# Patient Record
Sex: Female | Born: 1935 | Race: White | Hispanic: No | Smoking: Never smoker
Health system: Southern US, Community
[De-identification: ages and names within clinical notes are randomized; demographics above are authoritative.]

## PROBLEM LIST (undated history)

## (undated) DIAGNOSIS — K810 Acute cholecystitis: Secondary | ICD-10-CM

## (undated) DIAGNOSIS — F419 Anxiety disorder, unspecified: Secondary | ICD-10-CM

## (undated) DIAGNOSIS — H9190 Unspecified hearing loss, unspecified ear: Secondary | ICD-10-CM

## (undated) DIAGNOSIS — F329 Major depressive disorder, single episode, unspecified: Secondary | ICD-10-CM

## (undated) DIAGNOSIS — H409 Unspecified glaucoma: Secondary | ICD-10-CM

## (undated) DIAGNOSIS — R1310 Dysphagia, unspecified: Secondary | ICD-10-CM

## (undated) DIAGNOSIS — Z8719 Personal history of other diseases of the digestive system: Secondary | ICD-10-CM

## (undated) DIAGNOSIS — Z9289 Personal history of other medical treatment: Secondary | ICD-10-CM

## (undated) DIAGNOSIS — K219 Gastro-esophageal reflux disease without esophagitis: Secondary | ICD-10-CM

## (undated) DIAGNOSIS — I10 Essential (primary) hypertension: Secondary | ICD-10-CM

## (undated) DIAGNOSIS — G5600 Carpal tunnel syndrome, unspecified upper limb: Secondary | ICD-10-CM

## (undated) DIAGNOSIS — R531 Weakness: Secondary | ICD-10-CM

## (undated) DIAGNOSIS — E785 Hyperlipidemia, unspecified: Secondary | ICD-10-CM

## (undated) DIAGNOSIS — F32A Depression, unspecified: Secondary | ICD-10-CM

## (undated) DIAGNOSIS — D649 Anemia, unspecified: Secondary | ICD-10-CM

## (undated) DIAGNOSIS — N289 Disorder of kidney and ureter, unspecified: Secondary | ICD-10-CM

## (undated) DIAGNOSIS — K8020 Calculus of gallbladder without cholecystitis without obstruction: Secondary | ICD-10-CM

## (undated) DIAGNOSIS — F39 Unspecified mood [affective] disorder: Secondary | ICD-10-CM

## (undated) DIAGNOSIS — E860 Dehydration: Secondary | ICD-10-CM

## (undated) DIAGNOSIS — L209 Atopic dermatitis, unspecified: Secondary | ICD-10-CM

## (undated) DIAGNOSIS — E039 Hypothyroidism, unspecified: Secondary | ICD-10-CM

## (undated) DIAGNOSIS — E119 Type 2 diabetes mellitus without complications: Secondary | ICD-10-CM

## (undated) DIAGNOSIS — IMO0001 Reserved for inherently not codable concepts without codable children: Secondary | ICD-10-CM

## (undated) HISTORY — DX: Atopic dermatitis, unspecified: L20.9

## (undated) HISTORY — DX: Calculus of gallbladder without cholecystitis without obstruction: K80.20

## (undated) HISTORY — PX: ABDOMINAL HYSTERECTOMY: SHX81

## (undated) HISTORY — DX: Hypothyroidism, unspecified: E03.9

## (undated) HISTORY — DX: Anxiety disorder, unspecified: F41.9

## (undated) HISTORY — PX: OTHER SURGICAL HISTORY: SHX169

## (undated) HISTORY — DX: Hyperlipidemia, unspecified: E78.5

## (undated) HISTORY — DX: Acute cholecystitis: K81.0

## (undated) HISTORY — PX: PARTIAL HYSTERECTOMY: SHX80

## (undated) HISTORY — DX: Personal history of other diseases of the digestive system: Z87.19

## (undated) HISTORY — DX: Personal history of other medical treatment: Z92.89

## (undated) HISTORY — PX: CHOLECYSTECTOMY: SHX55

## (undated) HISTORY — DX: Dehydration: E86.0

## (undated) HISTORY — DX: Type 2 diabetes mellitus without complications: E11.9

## (undated) HISTORY — PX: TRIGGER FINGER RELEASE: SHX641

---

## 1995-11-01 DIAGNOSIS — F411 Generalized anxiety disorder: Secondary | ICD-10-CM | POA: Insufficient documentation

## 1995-11-01 DIAGNOSIS — F419 Anxiety disorder, unspecified: Secondary | ICD-10-CM

## 1995-11-01 DIAGNOSIS — E039 Hypothyroidism, unspecified: Secondary | ICD-10-CM

## 1995-11-01 HISTORY — DX: Anxiety disorder, unspecified: F41.9

## 1995-11-01 HISTORY — DX: Hypothyroidism, unspecified: E03.9

## 1996-05-25 DIAGNOSIS — E119 Type 2 diabetes mellitus without complications: Secondary | ICD-10-CM

## 1996-05-25 HISTORY — DX: Type 2 diabetes mellitus without complications: E11.9

## 1997-03-25 ENCOUNTER — Encounter: Payer: Self-pay | Admitting: Family Medicine

## 1997-03-25 LAB — CONVERTED CEMR LAB: Pap Smear: NORMAL

## 2000-06-25 ENCOUNTER — Encounter: Payer: Self-pay | Admitting: Family Medicine

## 2000-07-16 ENCOUNTER — Other Ambulatory Visit: Admission: RE | Admit: 2000-07-16 | Discharge: 2000-07-16 | Payer: Self-pay | Admitting: Family Medicine

## 2000-09-22 ENCOUNTER — Encounter: Payer: Self-pay | Admitting: Family Medicine

## 2000-09-22 LAB — CONVERTED CEMR LAB: Hgb A1c MFr Bld: 6.9 %

## 2001-04-08 ENCOUNTER — Encounter: Payer: Self-pay | Admitting: Family Medicine

## 2001-06-25 ENCOUNTER — Encounter: Payer: Self-pay | Admitting: Family Medicine

## 2001-06-25 LAB — CONVERTED CEMR LAB: Hgb A1c MFr Bld: 9.5 %

## 2001-08-23 ENCOUNTER — Encounter: Payer: Self-pay | Admitting: Family Medicine

## 2001-08-23 LAB — CONVERTED CEMR LAB: Hgb A1c MFr Bld: 9.4 %

## 2002-06-25 ENCOUNTER — Encounter: Payer: Self-pay | Admitting: Family Medicine

## 2002-06-25 DIAGNOSIS — E785 Hyperlipidemia, unspecified: Secondary | ICD-10-CM

## 2002-06-25 HISTORY — DX: Hyperlipidemia, unspecified: E78.5

## 2002-06-25 LAB — CONVERTED CEMR LAB
Hgb A1c MFr Bld: 11.2 %
Microalbumin U total vol: 6.4 mg/L

## 2002-07-19 ENCOUNTER — Other Ambulatory Visit: Admission: RE | Admit: 2002-07-19 | Discharge: 2002-07-19 | Payer: Self-pay | Admitting: Family Medicine

## 2002-07-24 LAB — FECAL OCCULT BLOOD, GUAIAC: Fecal Occult Blood: NEGATIVE

## 2002-08-24 ENCOUNTER — Encounter: Payer: Self-pay | Admitting: Family Medicine

## 2002-10-24 ENCOUNTER — Encounter: Payer: Self-pay | Admitting: Family Medicine

## 2003-11-23 ENCOUNTER — Encounter: Payer: Self-pay | Admitting: Family Medicine

## 2003-11-23 LAB — CONVERTED CEMR LAB: Hgb A1c MFr Bld: 9.8 %

## 2004-02-23 ENCOUNTER — Encounter: Payer: Self-pay | Admitting: Family Medicine

## 2004-03-25 ENCOUNTER — Ambulatory Visit: Payer: Self-pay | Admitting: Family Medicine

## 2004-04-21 ENCOUNTER — Ambulatory Visit: Payer: Self-pay | Admitting: Family Medicine

## 2004-05-22 ENCOUNTER — Ambulatory Visit: Payer: Self-pay | Admitting: Family Medicine

## 2004-06-25 ENCOUNTER — Ambulatory Visit: Payer: Self-pay | Admitting: Family Medicine

## 2004-07-25 ENCOUNTER — Ambulatory Visit: Payer: Self-pay | Admitting: Family Medicine

## 2004-08-27 ENCOUNTER — Ambulatory Visit: Payer: Self-pay | Admitting: Family Medicine

## 2004-09-26 ENCOUNTER — Ambulatory Visit: Payer: Self-pay | Admitting: Family Medicine

## 2004-10-09 ENCOUNTER — Ambulatory Visit: Payer: Self-pay | Admitting: Family Medicine

## 2004-10-23 ENCOUNTER — Encounter: Payer: Self-pay | Admitting: Family Medicine

## 2004-10-23 LAB — CONVERTED CEMR LAB
Hgb A1c MFr Bld: 10.2 %
Microalbumin U total vol: 5.3 mg/L

## 2004-10-27 ENCOUNTER — Ambulatory Visit: Payer: Self-pay | Admitting: Family Medicine

## 2004-11-20 ENCOUNTER — Ambulatory Visit: Payer: Self-pay | Admitting: Family Medicine

## 2004-11-26 ENCOUNTER — Ambulatory Visit: Payer: Self-pay | Admitting: Family Medicine

## 2004-12-23 ENCOUNTER — Ambulatory Visit: Payer: Self-pay | Admitting: Family Medicine

## 2005-01-12 ENCOUNTER — Ambulatory Visit: Payer: Self-pay | Admitting: Family Medicine

## 2005-02-16 ENCOUNTER — Ambulatory Visit: Payer: Self-pay | Admitting: Family Medicine

## 2005-02-22 ENCOUNTER — Encounter: Payer: Self-pay | Admitting: Family Medicine

## 2005-02-22 ENCOUNTER — Ambulatory Visit: Payer: Self-pay | Admitting: Family Medicine

## 2005-02-22 LAB — CONVERTED CEMR LAB
Hgb A1c MFr Bld: 7.7 %
Microalbumin U total vol: 5.3 mg/L

## 2005-02-24 ENCOUNTER — Ambulatory Visit: Payer: Self-pay | Admitting: Family Medicine

## 2005-02-26 ENCOUNTER — Ambulatory Visit: Payer: Self-pay | Admitting: Family Medicine

## 2005-04-02 ENCOUNTER — Ambulatory Visit: Payer: Self-pay | Admitting: Family Medicine

## 2005-05-13 ENCOUNTER — Ambulatory Visit: Payer: Self-pay | Admitting: Family Medicine

## 2005-07-06 ENCOUNTER — Ambulatory Visit: Payer: Self-pay | Admitting: Family Medicine

## 2005-07-10 ENCOUNTER — Ambulatory Visit: Payer: Self-pay | Admitting: Family Medicine

## 2005-07-10 DIAGNOSIS — Z9289 Personal history of other medical treatment: Secondary | ICD-10-CM

## 2005-07-10 HISTORY — DX: Personal history of other medical treatment: Z92.89

## 2005-08-11 ENCOUNTER — Ambulatory Visit: Payer: Self-pay | Admitting: Family Medicine

## 2005-09-08 ENCOUNTER — Ambulatory Visit: Payer: Self-pay | Admitting: Family Medicine

## 2005-10-09 ENCOUNTER — Ambulatory Visit: Payer: Self-pay | Admitting: Family Medicine

## 2005-11-16 ENCOUNTER — Ambulatory Visit: Payer: Self-pay | Admitting: Family Medicine

## 2005-12-22 ENCOUNTER — Ambulatory Visit: Payer: Self-pay | Admitting: Family Medicine

## 2006-01-26 ENCOUNTER — Ambulatory Visit: Payer: Self-pay | Admitting: Family Medicine

## 2006-02-12 ENCOUNTER — Ambulatory Visit: Payer: Self-pay | Admitting: Family Medicine

## 2006-02-22 ENCOUNTER — Ambulatory Visit: Payer: Self-pay | Admitting: Family Medicine

## 2006-03-09 ENCOUNTER — Ambulatory Visit: Payer: Self-pay | Admitting: Family Medicine

## 2006-03-25 ENCOUNTER — Ambulatory Visit: Payer: Self-pay | Admitting: Family Medicine

## 2006-04-12 ENCOUNTER — Ambulatory Visit: Payer: Self-pay | Admitting: Family Medicine

## 2006-05-11 ENCOUNTER — Ambulatory Visit: Payer: Self-pay | Admitting: Family Medicine

## 2006-06-16 ENCOUNTER — Ambulatory Visit: Payer: Self-pay | Admitting: Family Medicine

## 2006-07-27 ENCOUNTER — Ambulatory Visit: Payer: Self-pay | Admitting: Family Medicine

## 2006-08-24 ENCOUNTER — Ambulatory Visit: Payer: Self-pay | Admitting: Family Medicine

## 2006-09-08 ENCOUNTER — Encounter: Payer: Self-pay | Admitting: Family Medicine

## 2006-09-08 DIAGNOSIS — N959 Unspecified menopausal and perimenopausal disorder: Secondary | ICD-10-CM | POA: Insufficient documentation

## 2006-09-28 ENCOUNTER — Ambulatory Visit: Payer: Self-pay | Admitting: Family Medicine

## 2006-10-28 ENCOUNTER — Ambulatory Visit: Payer: Self-pay | Admitting: Family Medicine

## 2006-10-28 DIAGNOSIS — L259 Unspecified contact dermatitis, unspecified cause: Secondary | ICD-10-CM

## 2006-10-28 LAB — CONVERTED CEMR LAB: Hgb A1c MFr Bld: 14.2 % — ABNORMAL HIGH (ref 4.6–6.0)

## 2006-11-08 ENCOUNTER — Encounter: Payer: Self-pay | Admitting: Family Medicine

## 2006-11-22 ENCOUNTER — Encounter: Payer: Self-pay | Admitting: Family Medicine

## 2006-11-24 ENCOUNTER — Encounter: Payer: Self-pay | Admitting: Family Medicine

## 2006-11-24 ENCOUNTER — Encounter (INDEPENDENT_AMBULATORY_CARE_PROVIDER_SITE_OTHER): Payer: Self-pay | Admitting: *Deleted

## 2006-11-29 ENCOUNTER — Ambulatory Visit: Payer: Self-pay | Admitting: Family Medicine

## 2006-12-01 ENCOUNTER — Ambulatory Visit: Payer: Self-pay | Admitting: Family Medicine

## 2006-12-01 DIAGNOSIS — D72819 Decreased white blood cell count, unspecified: Secondary | ICD-10-CM | POA: Insufficient documentation

## 2006-12-01 LAB — CONVERTED CEMR LAB
ALT: 24 units/L (ref 0–35)
AST: 51 units/L — ABNORMAL HIGH (ref 0–37)
Albumin: 2.8 g/dL — ABNORMAL LOW (ref 3.5–5.2)
Basophils Absolute: 0 10*3/uL (ref 0.0–0.1)
Calcium: 9.3 mg/dL (ref 8.4–10.5)
Chloride: 110 meq/L (ref 96–112)
Creatinine, Ser: 0.7 mg/dL (ref 0.4–1.2)
Creatinine,U: 32.7 mg/dL
Eosinophils Absolute: 0.4 10*3/uL (ref 0.0–0.6)
Eosinophils Relative: 13 % — ABNORMAL HIGH (ref 0.0–5.0)
Free T4: 0.7 ng/dL (ref 0.6–1.6)
GFR calc non Af Amer: 88 mL/min
Glucose, Bld: 367 mg/dL — ABNORMAL HIGH (ref 70–99)
HCT: 32.3 % — ABNORMAL LOW (ref 36.0–46.0)
Hgb A1c MFr Bld: 11.1 % — ABNORMAL HIGH (ref 4.6–6.0)
Neutrophils Relative %: 44.8 % (ref 43.0–77.0)
Platelets: 131 10*3/uL — ABNORMAL LOW (ref 150–400)
RBC: 3.57 M/uL — ABNORMAL LOW (ref 3.87–5.11)
RDW: 13.9 % (ref 11.5–14.6)
Sodium: 140 meq/L (ref 135–145)
Total Bilirubin: 0.9 mg/dL (ref 0.3–1.2)
WBC: 3.3 10*3/uL — ABNORMAL LOW (ref 4.5–10.5)

## 2006-12-02 ENCOUNTER — Ambulatory Visit: Payer: Self-pay | Admitting: Oncology

## 2006-12-15 ENCOUNTER — Encounter: Payer: Self-pay | Admitting: Family Medicine

## 2006-12-15 LAB — CBC WITH DIFFERENTIAL (CANCER CENTER ONLY)
EOS%: 11.1 % — ABNORMAL HIGH (ref 0.0–7.0)
Eosinophils Absolute: 0.4 10*3/uL (ref 0.0–0.5)
LYMPH%: 38.8 % (ref 14.0–48.0)
MCH: 30.3 pg (ref 26.0–34.0)
MCHC: 34 g/dL (ref 32.0–36.0)
MCV: 89 fL (ref 81–101)
MONO%: 6.2 % (ref 0.0–13.0)
Platelets: 145 10*3/uL (ref 145–400)
RBC: 4.04 10*6/uL (ref 3.70–5.32)

## 2006-12-15 LAB — FERRITIN: Ferritin: 47 ng/mL (ref 10–291)

## 2006-12-15 LAB — COMPREHENSIVE METABOLIC PANEL
ALT: 33 U/L (ref 0–35)
AST: 61 U/L — ABNORMAL HIGH (ref 0–37)
Albumin: 3.4 g/dL — ABNORMAL LOW (ref 3.5–5.2)
Alkaline Phosphatase: 277 U/L — ABNORMAL HIGH (ref 39–117)
BUN: 15 mg/dL (ref 6–23)
CO2: 25 mEq/L (ref 19–32)
Calcium: 9.2 mg/dL (ref 8.4–10.5)
Chloride: 105 mEq/L (ref 96–112)
Creatinine, Ser: 0.74 mg/dL (ref 0.40–1.20)
Glucose, Bld: 181 mg/dL — ABNORMAL HIGH (ref 70–99)
Potassium: 4 mEq/L (ref 3.5–5.3)
Sodium: 138 mEq/L (ref 135–145)
Total Bilirubin: 1.1 mg/dL (ref 0.3–1.2)
Total Protein: 7.6 g/dL (ref 6.0–8.3)

## 2006-12-15 LAB — RETICULOCYTES (CHCC)
ABS Retic: 45.2 10*3/uL (ref 19.0–186.0)
RBC.: 4.11 MIL/uL (ref 3.87–5.11)
Retic Ct Pct: 1.1 % (ref 0.4–3.1)

## 2006-12-15 LAB — LACTATE DEHYDROGENASE: LDH: 171 U/L (ref 94–250)

## 2006-12-22 ENCOUNTER — Encounter: Payer: Self-pay | Admitting: Family Medicine

## 2007-01-03 ENCOUNTER — Telehealth (INDEPENDENT_AMBULATORY_CARE_PROVIDER_SITE_OTHER): Payer: Self-pay | Admitting: *Deleted

## 2007-01-21 ENCOUNTER — Encounter: Payer: Self-pay | Admitting: Family Medicine

## 2007-01-25 ENCOUNTER — Ambulatory Visit: Payer: Self-pay | Admitting: Family Medicine

## 2007-02-18 ENCOUNTER — Ambulatory Visit: Payer: Self-pay | Admitting: Dermatology

## 2007-02-25 ENCOUNTER — Ambulatory Visit: Payer: Self-pay | Admitting: Family Medicine

## 2007-03-16 ENCOUNTER — Encounter: Payer: Self-pay | Admitting: Family Medicine

## 2007-03-16 ENCOUNTER — Ambulatory Visit: Payer: Self-pay | Admitting: Oncology

## 2007-03-17 ENCOUNTER — Encounter: Payer: Self-pay | Admitting: Family Medicine

## 2007-03-17 LAB — CBC WITH DIFFERENTIAL (CANCER CENTER ONLY)
BASO%: 0.3 % (ref 0.0–2.0)
LYMPH%: 29.9 % (ref 14.0–48.0)
MCH: 30.7 pg (ref 26.0–34.0)
MCV: 90 fL (ref 81–101)
MONO#: 0.3 10*3/uL (ref 0.1–0.9)
MONO%: 7.5 % (ref 0.0–13.0)
NEUT#: 2.6 10*3/uL (ref 1.5–6.5)
Platelets: 119 10*3/uL — ABNORMAL LOW (ref 145–400)
RDW: 13.8 % (ref 10.5–14.6)
WBC: 4.5 10*3/uL (ref 3.9–10.0)

## 2007-03-22 ENCOUNTER — Ambulatory Visit: Payer: Self-pay | Admitting: Family Medicine

## 2007-04-06 ENCOUNTER — Ambulatory Visit: Payer: Self-pay | Admitting: Family Medicine

## 2007-05-12 ENCOUNTER — Ambulatory Visit: Payer: Self-pay | Admitting: Family Medicine

## 2007-06-17 ENCOUNTER — Ambulatory Visit: Payer: Self-pay | Admitting: Family Medicine

## 2007-06-24 ENCOUNTER — Encounter: Payer: Self-pay | Admitting: Family Medicine

## 2007-07-11 ENCOUNTER — Telehealth: Payer: Self-pay | Admitting: Family Medicine

## 2007-07-19 ENCOUNTER — Ambulatory Visit: Payer: Self-pay | Admitting: Family Medicine

## 2007-07-22 ENCOUNTER — Telehealth: Payer: Self-pay | Admitting: Family Medicine

## 2007-08-22 ENCOUNTER — Ambulatory Visit: Payer: Self-pay | Admitting: Family Medicine

## 2007-09-14 ENCOUNTER — Ambulatory Visit: Payer: Self-pay | Admitting: Oncology

## 2007-09-17 ENCOUNTER — Encounter: Payer: Self-pay | Admitting: Family Medicine

## 2007-09-27 ENCOUNTER — Ambulatory Visit: Payer: Self-pay | Admitting: Family Medicine

## 2007-09-29 ENCOUNTER — Encounter: Payer: Self-pay | Admitting: Family Medicine

## 2007-09-30 ENCOUNTER — Telehealth: Payer: Self-pay | Admitting: Family Medicine

## 2007-10-24 ENCOUNTER — Ambulatory Visit: Payer: Self-pay | Admitting: Oncology

## 2007-10-24 ENCOUNTER — Encounter: Payer: Self-pay | Admitting: Family Medicine

## 2007-10-27 ENCOUNTER — Ambulatory Visit: Payer: Self-pay | Admitting: Family Medicine

## 2007-11-16 ENCOUNTER — Inpatient Hospital Stay: Payer: Self-pay | Admitting: Endocrinology

## 2007-11-18 ENCOUNTER — Encounter: Payer: Self-pay | Admitting: Family Medicine

## 2007-11-21 ENCOUNTER — Encounter: Payer: Self-pay | Admitting: Family Medicine

## 2007-11-21 DIAGNOSIS — Z8719 Personal history of other diseases of the digestive system: Secondary | ICD-10-CM

## 2007-11-21 HISTORY — DX: Personal history of other diseases of the digestive system: Z87.19

## 2007-11-23 ENCOUNTER — Ambulatory Visit: Payer: Self-pay | Admitting: Oncology

## 2007-11-23 ENCOUNTER — Encounter: Payer: Self-pay | Admitting: Family Medicine

## 2007-11-28 DIAGNOSIS — Z8619 Personal history of other infectious and parasitic diseases: Secondary | ICD-10-CM

## 2007-11-28 DIAGNOSIS — M359 Systemic involvement of connective tissue, unspecified: Secondary | ICD-10-CM | POA: Insufficient documentation

## 2007-11-28 DIAGNOSIS — D61818 Other pancytopenia: Secondary | ICD-10-CM | POA: Insufficient documentation

## 2007-12-15 ENCOUNTER — Other Ambulatory Visit: Payer: Self-pay

## 2007-12-15 ENCOUNTER — Ambulatory Visit: Payer: Self-pay | Admitting: Family Medicine

## 2007-12-15 ENCOUNTER — Emergency Department: Payer: Self-pay | Admitting: Emergency Medicine

## 2007-12-24 ENCOUNTER — Ambulatory Visit: Payer: Self-pay | Admitting: Oncology

## 2007-12-30 ENCOUNTER — Encounter: Payer: Self-pay | Admitting: Family Medicine

## 2008-01-19 ENCOUNTER — Telehealth (INDEPENDENT_AMBULATORY_CARE_PROVIDER_SITE_OTHER): Payer: Self-pay | Admitting: *Deleted

## 2008-01-31 ENCOUNTER — Telehealth: Payer: Self-pay | Admitting: Family Medicine

## 2008-03-08 ENCOUNTER — Encounter (INDEPENDENT_AMBULATORY_CARE_PROVIDER_SITE_OTHER): Payer: Self-pay | Admitting: *Deleted

## 2008-03-08 ENCOUNTER — Telehealth: Payer: Self-pay | Admitting: Family Medicine

## 2008-03-14 ENCOUNTER — Ambulatory Visit: Payer: Self-pay | Admitting: Family Medicine

## 2008-04-17 ENCOUNTER — Telehealth: Payer: Self-pay | Admitting: Family Medicine

## 2008-04-20 ENCOUNTER — Telehealth (INDEPENDENT_AMBULATORY_CARE_PROVIDER_SITE_OTHER): Payer: Self-pay | Admitting: *Deleted

## 2008-05-08 ENCOUNTER — Ambulatory Visit: Payer: Self-pay | Admitting: Family Medicine

## 2008-05-08 DIAGNOSIS — R1011 Right upper quadrant pain: Secondary | ICD-10-CM | POA: Insufficient documentation

## 2008-05-09 ENCOUNTER — Telehealth: Payer: Self-pay | Admitting: Family Medicine

## 2008-05-09 ENCOUNTER — Encounter: Payer: Self-pay | Admitting: Family Medicine

## 2008-05-11 ENCOUNTER — Inpatient Hospital Stay: Payer: Self-pay | Admitting: *Deleted

## 2008-05-11 DIAGNOSIS — K8020 Calculus of gallbladder without cholecystitis without obstruction: Secondary | ICD-10-CM

## 2008-05-11 HISTORY — DX: Calculus of gallbladder without cholecystitis without obstruction: K80.20

## 2008-05-20 ENCOUNTER — Encounter: Payer: Self-pay | Admitting: Family Medicine

## 2008-05-22 ENCOUNTER — Telehealth: Payer: Self-pay | Admitting: Family Medicine

## 2008-05-23 ENCOUNTER — Telehealth: Payer: Self-pay | Admitting: Family Medicine

## 2008-05-31 ENCOUNTER — Encounter: Payer: Self-pay | Admitting: Family Medicine

## 2008-06-01 ENCOUNTER — Ambulatory Visit: Payer: Self-pay | Admitting: Family Medicine

## 2008-06-01 DIAGNOSIS — M79609 Pain in unspecified limb: Secondary | ICD-10-CM | POA: Insufficient documentation

## 2008-06-05 ENCOUNTER — Ambulatory Visit: Payer: Self-pay | Admitting: Family Medicine

## 2008-06-08 ENCOUNTER — Encounter: Payer: Self-pay | Admitting: Family Medicine

## 2008-06-11 ENCOUNTER — Encounter (INDEPENDENT_AMBULATORY_CARE_PROVIDER_SITE_OTHER): Payer: Self-pay | Admitting: *Deleted

## 2008-06-19 ENCOUNTER — Ambulatory Visit: Payer: Self-pay | Admitting: Family Medicine

## 2008-06-21 LAB — CONVERTED CEMR LAB
BUN: 7 mg/dL (ref 6–23)
Basophils Relative: 0.8 % (ref 0.0–3.0)
CO2: 30 meq/L (ref 19–32)
Calcium: 9.5 mg/dL (ref 8.4–10.5)
Chloride: 103 meq/L (ref 96–112)
Creatinine, Ser: 0.7 mg/dL (ref 0.4–1.2)
Eosinophils Relative: 0.5 % (ref 0.0–5.0)
Glucose, Bld: 268 mg/dL — ABNORMAL HIGH (ref 70–99)
Hemoglobin: 12.6 g/dL (ref 12.0–15.0)
Lymphocytes Relative: 34.6 % (ref 12.0–46.0)
Magnesium: 1.6 mg/dL (ref 1.5–2.5)
Monocytes Relative: 9.5 % (ref 3.0–12.0)
Neutro Abs: 2 10*3/uL (ref 1.4–7.7)
Neutrophils Relative %: 54.6 % (ref 43.0–77.0)
RBC: 3.99 M/uL (ref 3.87–5.11)
WBC: 3.7 10*3/uL — ABNORMAL LOW (ref 4.5–10.5)

## 2008-07-03 ENCOUNTER — Telehealth (INDEPENDENT_AMBULATORY_CARE_PROVIDER_SITE_OTHER): Payer: Self-pay | Admitting: *Deleted

## 2008-07-18 ENCOUNTER — Ambulatory Visit: Payer: Self-pay | Admitting: Family Medicine

## 2008-09-10 ENCOUNTER — Ambulatory Visit: Payer: Self-pay | Admitting: Family Medicine

## 2008-09-19 ENCOUNTER — Telehealth: Payer: Self-pay | Admitting: Family Medicine

## 2008-10-09 ENCOUNTER — Telehealth: Payer: Self-pay | Admitting: Family Medicine

## 2008-11-07 ENCOUNTER — Encounter: Payer: Self-pay | Admitting: Family Medicine

## 2008-11-09 ENCOUNTER — Ambulatory Visit: Payer: Self-pay | Admitting: Family Medicine

## 2008-11-13 ENCOUNTER — Ambulatory Visit: Payer: Self-pay | Admitting: Family Medicine

## 2008-11-27 ENCOUNTER — Ambulatory Visit: Payer: Self-pay | Admitting: Family Medicine

## 2008-11-27 DIAGNOSIS — K649 Unspecified hemorrhoids: Secondary | ICD-10-CM

## 2009-01-01 ENCOUNTER — Telehealth: Payer: Self-pay | Admitting: Family Medicine

## 2009-01-09 ENCOUNTER — Ambulatory Visit: Payer: Self-pay | Admitting: Family Medicine

## 2009-01-10 LAB — CONVERTED CEMR LAB
Albumin: 3.1 g/dL — ABNORMAL LOW (ref 3.5–5.2)
Alkaline Phosphatase: 137 units/L — ABNORMAL HIGH (ref 39–117)
BUN: 11 mg/dL (ref 6–23)
Basophils Relative: 0.8 % (ref 0.0–3.0)
CO2: 30 meq/L (ref 19–32)
Chloride: 110 meq/L (ref 96–112)
Creatinine, Ser: 0.7 mg/dL (ref 0.4–1.2)
Creatinine,U: 90.2 mg/dL
Free T4: 1.3 ng/dL (ref 0.6–1.6)
Glucose, Bld: 204 mg/dL — ABNORMAL HIGH (ref 70–99)
HCT: 32.8 % — ABNORMAL LOW (ref 36.0–46.0)
Hemoglobin: 11.3 g/dL — ABNORMAL LOW (ref 12.0–15.0)
LDL Cholesterol: 58 mg/dL (ref 0–99)
Lymphocytes Relative: 38.2 % (ref 12.0–46.0)
MCHC: 34.5 g/dL (ref 30.0–36.0)
Microalb, Ur: 0.4 mg/dL (ref 0.0–1.9)
Monocytes Relative: 10.1 % (ref 3.0–12.0)
Neutro Abs: 1.5 10*3/uL (ref 1.4–7.7)
RBC: 3.65 M/uL — ABNORMAL LOW (ref 3.87–5.11)
TSH: 0.07 microintl units/mL — ABNORMAL LOW (ref 0.35–5.50)
Total Bilirubin: 1.2 mg/dL (ref 0.3–1.2)
Total CHOL/HDL Ratio: 3
Triglycerides: 117 mg/dL (ref 0.0–149.0)
Vitamin B-12: 252 pg/mL (ref 211–911)

## 2009-01-14 ENCOUNTER — Ambulatory Visit: Payer: Self-pay | Admitting: Family Medicine

## 2009-02-06 ENCOUNTER — Telehealth: Payer: Self-pay | Admitting: Family Medicine

## 2009-02-18 ENCOUNTER — Ambulatory Visit: Payer: Self-pay | Admitting: Family Medicine

## 2009-02-19 LAB — CONVERTED CEMR LAB
Calcium: 9.2 mg/dL (ref 8.4–10.5)
Creatinine, Ser: 0.6 mg/dL (ref 0.4–1.2)
GFR calc non Af Amer: 104.44 mL/min (ref >60)
Hgb A1c MFr Bld: 8.2 % — ABNORMAL HIGH (ref 4.6–6.5)
Vitamin B-12: >1500 pg/mL — ABNORMAL HIGH (ref 211–911)

## 2009-02-28 ENCOUNTER — Telehealth: Payer: Self-pay | Admitting: Family Medicine

## 2009-03-21 ENCOUNTER — Ambulatory Visit: Payer: Self-pay | Admitting: Family Medicine

## 2009-03-27 ENCOUNTER — Telehealth: Payer: Self-pay | Admitting: Family Medicine

## 2009-04-16 ENCOUNTER — Telehealth: Payer: Self-pay | Admitting: Family Medicine

## 2009-05-14 ENCOUNTER — Ambulatory Visit: Payer: Self-pay | Admitting: Family Medicine

## 2009-05-30 ENCOUNTER — Ambulatory Visit: Payer: Self-pay | Admitting: Family Medicine

## 2009-06-07 ENCOUNTER — Telehealth: Payer: Self-pay | Admitting: Family Medicine

## 2009-06-11 ENCOUNTER — Ambulatory Visit: Payer: Self-pay | Admitting: Family Medicine

## 2009-06-11 DIAGNOSIS — R21 Rash and other nonspecific skin eruption: Secondary | ICD-10-CM

## 2009-06-21 ENCOUNTER — Telehealth: Payer: Self-pay | Admitting: Family Medicine

## 2009-07-08 ENCOUNTER — Telehealth: Payer: Self-pay | Admitting: Family Medicine

## 2009-07-09 ENCOUNTER — Ambulatory Visit: Payer: Self-pay | Admitting: Family Medicine

## 2009-07-15 ENCOUNTER — Telehealth: Payer: Self-pay | Admitting: Family Medicine

## 2009-08-07 ENCOUNTER — Ambulatory Visit: Payer: Self-pay | Admitting: Family Medicine

## 2009-09-19 ENCOUNTER — Telehealth: Payer: Self-pay | Admitting: Family Medicine

## 2009-09-20 ENCOUNTER — Ambulatory Visit: Payer: Self-pay | Admitting: Family Medicine

## 2009-10-09 ENCOUNTER — Telehealth: Payer: Self-pay | Admitting: Family Medicine

## 2009-11-11 ENCOUNTER — Telehealth: Payer: Self-pay | Admitting: Family Medicine

## 2009-11-22 ENCOUNTER — Ambulatory Visit: Payer: Self-pay | Admitting: Family Medicine

## 2009-12-09 ENCOUNTER — Telehealth: Payer: Self-pay | Admitting: Family Medicine

## 2009-12-30 ENCOUNTER — Encounter (INDEPENDENT_AMBULATORY_CARE_PROVIDER_SITE_OTHER): Payer: Self-pay | Admitting: *Deleted

## 2010-01-09 ENCOUNTER — Encounter (INDEPENDENT_AMBULATORY_CARE_PROVIDER_SITE_OTHER): Payer: Self-pay | Admitting: *Deleted

## 2010-01-09 ENCOUNTER — Telehealth: Payer: Self-pay | Admitting: Family Medicine

## 2010-01-31 ENCOUNTER — Ambulatory Visit: Payer: Self-pay | Admitting: Family Medicine

## 2010-02-03 ENCOUNTER — Encounter: Payer: Self-pay | Admitting: Family Medicine

## 2010-02-03 ENCOUNTER — Telehealth: Payer: Self-pay | Admitting: Family Medicine

## 2010-03-11 ENCOUNTER — Telehealth: Payer: Self-pay | Admitting: Family Medicine

## 2010-03-27 ENCOUNTER — Telehealth: Payer: Self-pay | Admitting: Family Medicine

## 2010-04-09 ENCOUNTER — Ambulatory Visit: Payer: Self-pay | Admitting: Family Medicine

## 2010-04-11 ENCOUNTER — Telehealth: Payer: Self-pay | Admitting: Family Medicine

## 2010-04-29 ENCOUNTER — Telehealth: Payer: Self-pay | Admitting: Family Medicine

## 2010-05-08 ENCOUNTER — Ambulatory Visit: Payer: Self-pay | Admitting: Family Medicine

## 2010-05-15 ENCOUNTER — Telehealth: Payer: Self-pay | Admitting: Family Medicine

## 2010-06-06 ENCOUNTER — Ambulatory Visit
Admission: RE | Admit: 2010-06-06 | Discharge: 2010-06-06 | Payer: Self-pay | Source: Home / Self Care | Attending: Family Medicine | Admitting: Family Medicine

## 2010-06-12 ENCOUNTER — Telehealth: Payer: Self-pay | Admitting: Family Medicine

## 2010-06-19 ENCOUNTER — Ambulatory Visit: Admit: 2010-06-19 | Payer: Self-pay | Admitting: Family Medicine

## 2010-06-20 ENCOUNTER — Encounter: Payer: Self-pay | Admitting: Family Medicine

## 2010-06-24 NOTE — Progress Notes (Signed)
Summary: derm referral   Phone Note Call from Patient Call back at [facility] Phone [phone] Call back at [phone]   Caller: Patient Call For: Shaune Leeks MD Summary of Call: Patient wants to know if she can have a referral to derm for the rash that is on her arm because it is constantly itching. She was seen by dr. Ermalene Searing for this and dr. Ermalene Searing recommended derm, son tried to get her in to [facility] and they can't get her in until the 31st and he would like to get her seen before then. They prefer [place]. Please adivise.  Initial call taken by: Melody Comas,  September 19, 2009 4:20 PM  Follow-up for Phone Call        Sp w/ pts son, says they have an appt w/ Dr. Hetty Ely today, they will discuss the referra at this time.Daine Gip  Sep 24, 2009 9:47 AM   Follow-up by: Daine Gip,  Sep 24, 2009 9:47 AM  Additional Follow-up for Phone Call Additional follow up Details #1::        Derm appt scheduled for May 9th at 11:45am..Daine Gip  Sep 24, 2009 2:53 PM  Additional Follow-up by: Daine Gip,  Sep 24, 2009 2:53 PM

## 2010-06-24 NOTE — Progress Notes (Signed)
Summary: Alprazolam  Phone Note Refill Request Message from:  Fax from Pharmacy on December 09, 2009 1:14 PM  Refills Requested: Medication #1:  ALPRAZOLAM 0.5 MG TABS 1/2 to 1 daily three times a day only if needed [facility]  [phone]   Method Requested: Telephone to Pharmacy Initial call taken by: Delilah Shan CMA Audree Schrecengost Dull),  December 09, 2009 1:16 PM  Follow-up for Phone Call        Medication phoned to pharmacy.  Follow-up by: Delilah Shan CMA Cabot Cromartie Dull),  December 09, 2009 2:16 PM    Prescriptions: ALPRAZOLAM 0.5 MG TABS (ALPRAZOLAM) 1/2 to 1 daily three times a day only if needed  #30 x 0   Entered and Authorized by:   Crawford Givens MD   Signed by:   Crawford Givens MD on 12/09/2009   Method used:   Print then Give to Patient   RxID:   [id number]

## 2010-06-24 NOTE — Progress Notes (Signed)
Summary: ALPRAZOLAM  Phone Note Refill Request Message from:  [facility] [phone] on March 27, 2009 4:03 PM  Refills Requested: Medication #1:  ALPRAZOLAM 0.5 MG TABS 1/2 to 1 daily three times a day only if needed   Last Refilled: 02/28/2009  Method Requested: Telephone to Pharmacy Initial call taken by: Mervin Hack CMA Duncan Dull),  March 27, 2009 4:03 PM  Follow-up for Phone Call        Rx called to pharmacy Follow-up by: Sydell Axon LPN,  March 27, 2009 5:35 PM    Prescriptions: ALPRAZOLAM 0.5 MG TABS (ALPRAZOLAM) 1/2 to 1 daily three times a day only if needed  #30 x 0   Entered and Authorized by:   Shaune Leeks MD   Signed by:   Shaune Leeks MD on 03/27/2009   Method used:   Telephoned to ...       [facility] (retail)       [address]       [place], [place]  [address]       Ph: [phone]       Fax: [phone]   RxID:   [id number]

## 2010-06-24 NOTE — Progress Notes (Signed)
Summary: refill request for alprazolam  Phone Note Refill Request Message from:  Fax from Pharmacy  Refills Requested: Medication #1:  ALPRAZOLAM 0.5 MG TABS 1/2 to 1 daily three times a day only if needed   Last Refilled: 10/09/2009 [facility]ed [facility], [phone].  Initial call taken by: Lowella Petties CMA,  November 11, 2009 8:52 AM  Follow-up for Phone Call        Called to pharmacy. Follow-up by: Lowella Petties CMA,  November 11, 2009 2:11 PM    Prescriptions: ALPRAZOLAM 0.5 MG TABS (ALPRAZOLAM) 1/2 to 1 daily three times a day only if needed  #30 x 0   Entered and Authorized by:   Shaune Leeks MD   Signed by:   Shaune Leeks MD on 11/11/2009   Method used:   Telephoned to ...       [facility] (retail)       [address]       [place], [place]  [address]       Ph: [phone]       Fax: [phone]   RxID:   [id number]

## 2010-06-24 NOTE — Progress Notes (Signed)
Summary: Rx Alprazolam and Potassium  Phone Note Refill Request Call back at [phone] Message from:  [facility] on March 27, 2010 2:36 PM  Refills Requested: Medication #1:  KLOR-CON M20 20 MEQ  CR-TABS 2 tablets daily by mouth   Last Refilled: 02/19/2010  Medication #2:  ALPRAZOLAM 0.5 MG TABS 1/2 to 1 daily three times a day only if needed   Last Refilled: 03/11/2010  Method Requested: Telephone to Pharmacy Initial call taken by: Sydell Axon LPN,  March 27, 2010 2:38 PM  Follow-up for Phone Call        Rx called to pharmacy as instructed. Was informed by Victorino Dike that she will call and inform the patient's son that she needs to be seen for further refills. Follow-up by: Sydell Axon LPN,  March 27, 2010 4:27 PM    Pt was told she needed to be seen after the last prescription she waqs given. She had an appt and cancelled it. She will need to be seen before another prescription after this one can be filled.Prescriptions: KLOR-CON M20 20 MEQ  CR-TABS (POTASSIUM CHLORIDE CRYS CR) 2 tablets daily by mouth  #60 x 0   Entered and Authorized by:   Shaune Leeks MD   Signed by:   Shaune Leeks MD on 03/27/2010   Method used:   Telephoned to ...       [facility] (retail)       [address]       [place], [place]  [address]       Ph: [phone]       Fax: [phone]   RxID:   [id number] ALPRAZOLAM 0.5 MG TABS (ALPRAZOLAM) 1/2 to 1 daily three times a day only if needed  #30 x 0   Entered and Authorized by:   Shaune Leeks MD   Signed by:   Shaune Leeks MD on 03/27/2010   Method used:   Telephoned to ...       [facility] (retail)       [address]       [place], [place]  [address]       Ph: [phone]       Fax: [phone]   RxID:   [id number]

## 2010-06-24 NOTE — Assessment & Plan Note (Signed)
Summary: B-12 SHOT/Prospero Mahnke/CLE  Nurse Visit   Allergies: 1)  ! * Mobic  Medication Administration  Injection # 1:    Medication: Vit B12 1000 mcg    Diagnosis: ANEMIA, PERNICIOUS (B12 69) (ICD-281.0)    Route: IM    Site: R deltoid    Exp Date: 02/23/2011    Lot #: [id number]    Mfr: [employer]    Patient tolerated injection without complications    Given by: Linde Gillis CMA Duncan Dull) (May 30, 2009 2:52 PM)  Orders Added: 1)  Vit B12 1000 mcg [J3420] 2)  Admin of Therapeutic Inj  intramuscular or subcutaneous [96045]

## 2010-06-24 NOTE — Letter (Signed)
Summary: Nadara Eaton letter  [place] [facility]  [address] [place], [place] [address]   Phone: [phone]  Fax: [phone]       12/30/2009 MRN: [id number]  JULLIE ARPS [address] [place], [place]  [address]  Dear Ms. [facility] [place], and [facility] announce the retirement of Arta Silence, M.D., from full-time practice at the [facility] office effective November 21, 2009 and his plans of returning part-time.  It is important to Dr. Hetty Ely and to our practice that you understand that [facility] has seven physicians in our office for your health care needs.  We will continue to offer the same exceptional care that you have today.    Dr. Hetty Ely has spoken to many of you about his plans for retirement and returning part-time in the fall.   We will continue to work with you through the transition to schedule appointments for you in the office and meet the high standards that [facility] is committed to.   Again, it is with great pleasure that we share the news that Dr. Hetty Ely will return to [facility] in October of 2011 with a reduced schedule.    If you have any questions, or would like to request an appointment with one of our physicians, please call us at [phone] and press the option for Scheduling an appointment.  We take pleasure in providing you with excellent patient care and look forward to seeing you at your next office visit.  Our [facility] Physicians are:  Tillman Abide, M.D. Laurita Quint, M.D. Roxy Manns, M.D. Kerby Nora, M.D. Hannah Beat, M.D. Ruthe Mannan, M.D. We proudly welcomed Raechel Ache, M.D. and Eustaquio Boyden, M.D. to the practice in July/August 2011.  Sincerely,  [facility]

## 2010-06-24 NOTE — Assessment & Plan Note (Signed)
Summary: b12 shot  Nurse Visit   Allergies: 1)  ! * Mobic  Medication Administration  Injection # 1:    Medication: Vit B12 1000 mcg    Diagnosis: ANEMIA, PERNICIOUS (B12 69) (ICD-281.0)    Route: IM    Site: L deltoid    Exp Date: 03/26/2011    Lot #: [id number]    Mfr: [employer]    Patient tolerated injection without complications    Given by: Linde Gillis CMA (AAMA) (September 20, 2009 9:12 AM)  Orders Added: 1)  Vit B12 1000 mcg [J3420] 2)  Admin of Therapeutic Inj  intramuscular or subcutaneous [96045]

## 2010-06-24 NOTE — Progress Notes (Signed)
Summary: refill request for alprazolam  Phone Note Refill Request Message from:  Fax from Pharmacy  Refills Requested: Medication #1:  ALPRAZOLAM 0.5 MG TABS 1/2 to 1 daily three times a day only if needed   Last Refilled: 01/09/2010 [facility]ed [facility], [phone].  Initial call taken by: Lowella Petties CMA,  February 03, 2010 11:18 AM  Follow-up for Phone Call        please call in.  patient needs OV with fastin labs a few days before.   cmet/lipid/A1c 250.00 TSH 244.9 Follow-up by: Crawford Givens MD,  February 03, 2010 2:07 PM  Additional Follow-up for Phone Call Additional follow up Details #1::        Medication phoned to pharmacy.   Was not able to phone patient.  Phone number listed is incorrect.  Son's phone number has been disconnected.  Letter mailed.  Additional Follow-up by: Delilah Shan CMA Alannis Hsia Dull),  February 03, 2010 4:10 PM    Prescriptions: ALPRAZOLAM 0.5 MG TABS (ALPRAZOLAM) 1/2 to 1 daily three times a day only if needed  #30 x 0   Entered by:   Delilah Shan CMA (AAMA)   Authorized by:   Crawford Givens MD   Signed by:   Delilah Shan CMA (AAMA) on 02/03/2010   Method used:   Handwritten   RxID:   [id number]

## 2010-06-24 NOTE — Assessment & Plan Note (Signed)
Summary: F/U DLO   Vital Signs:  Patient profile:   76 year old female Weight:      128 pounds BMI:     24.27 Temp:     98.3 degrees F oral Pulse rate:   72 / minute Pulse rhythm:   regular BP sitting:   124 / 68  (left arm) Cuff size:   regular  Vitals Entered By: Sydell Axon LPN (April 09, 2010 3:24 PM) CC: follow-up visit   History of Present Illness: Pt here for followup. Her sugar last week was 81. She was shaky and was given OJ by her neighbor. She has her diary with her and her sugars run throughout the day typically between 140 and low 200s. Efforts to better control her sugar aggressively had met with signif excursions and problems in the past. She has been dismissed from previous practices and has been fairly stable on her current regimen.  Her thumbs pop in prox joints. She has seen Derm for generalized rash on her legs.  Problems Prior to Update: 1)  Rash and Other Nonspecific Skin Eruption  (ICD-782.1) 2)  Hemorrhoids, With Bleeding  (ICD-455.8) 3)  Hand Pain, Bilateral  (ICD-729.5) 4)  Leg Pain, Left  (ICD-729.5) 5)  Abdominal Pain, Right Upper Quadrant  (ICD-789.01) 6)  Pancytopenia  (ICD-284.1) 7)  Mixed Connective Tissue Disease  (ICD-710.9) 8)  Hepatitis B, Hx of  (ICD-V12.09) 9)  Examination, Medical Nec, Admn Purposes  (ICD-V70.3) 10)  Leukopenia, Mild  (ICD-288.50) 11)  Dermatitis, Contact, Nos  (ICD-692.9) 12)  Hyperlipidemia  (ICD-272.4) 13)  Anemia, Pernicious (B12 69)  (ICD-281.0) 14)  Disorder, Menopausal Nos  (ICD-627.9) 15)  Diabetes Mellitus, Type II  (ICD-250.00) 16)  Anxiety  (ICD-300.00) 17)  Deafness (READS LIPS)  (ICD-389.9) 18)  Hypothyroidism  (ICD-244.9)  Medications Prior to Update: 1)  Estropipate 1.5 Mg Tabs (Estropipate) .... Take One By Mouth Daily 2)  Alprazolam 0.5 Mg Tabs (Alprazolam) .... 1/2 To 1 Daily Three Times A Day Only If Needed 3)  Vitamin B-12 Cr 1000 Mcg  Tbcr (Cyanocobalamin) .... Take Monthly 4)  Nexium 40 Mg   Cpdr (Esomeprazole Magnesium) .Marland Kitchen.. 1 By Mouth Every Am As Needed 5)  Bd Insulin Syringe Ultrafine 31g X 5/16" 0.5 Ml  Misc (Insulin Syringe-Needle U-100) .... Use As Directed Sliding Scale 6)  Levothroid 100 Mcg  Tabs (Levothyroxine Sodium) .Marland Kitchen.. 1 Daily By Mouth 7)  Klor-Con M20 20 Meq  Cr-Tabs (Potassium Chloride Crys Cr) .... 2 Tablets Daily By Mouth 8)  Freestyle Lite Test  Strp (Glucose Blood) .... Test Blood Sugar 4 Times A Day 9)  Freestyle Lancets  Misc (Lancets) .... Test Blood Sugar 4 Times A Day 10)  Novolog 100 Unit/ml Soln (Insulin Aspart) .... 9am Takes 2-4 Units, 1pm Takes 4 Units, 5pm Takes 6 Units, 9pm Takes 15 Units 11)  Hydroxychloroquine Sulfate 200 Mg Tabs (Hydroxychloroquine Sulfate) .... Take 1 Tablet By Mouth Once Daily 12)  Anusol-Hc 25 Mg Supp (Hydrocortisone Acetate) .... Use 1 Suppository 3-4 Times A Day As Needed For Rectal Pain 13)  Lantus 100 Unit/ml Soln (Insulin Glargine) .Marland Kitchen.. 15 Units in Evening. 14)  Keflex 250 Mg Caps (Cephalexin) .Marland Kitchen.. 1 Tab By Mouth Three Times A Day X 10 Days 15)  Lidex 0.05 % Crea (Fluocinonide) .... Apply To Area Two Times A Day Until Symptoms Improve  Current Medications (verified): 1)  Estropipate 1.5 Mg Tabs (Estropipate) .... Take One By Mouth Daily 2)  Alprazolam 0.5 Mg Tabs (Alprazolam) .Marland KitchenMarland KitchenMarland Kitchen  1/2 To 1 Daily Three Times A Day Only If Needed 3)  Vitamin B-12 Cr 1000 Mcg  Tbcr (Cyanocobalamin) .... Take Monthly 4)  Nexium 40 Mg  Cpdr (Esomeprazole Magnesium) .Marland Kitchen.. 1 By Mouth Every Am As Needed 5)  Bd Insulin Syringe Ultrafine 31g X 5/16" 0.5 Ml  Misc (Insulin Syringe-Needle U-100) .... Use As Directed Sliding Scale 6)  Levothroid 100 Mcg  Tabs (Levothyroxine Sodium) .Marland Kitchen.. 1 Daily By Mouth 7)  Klor-Con M20 20 Meq  Cr-Tabs (Potassium Chloride Crys Cr) .... 2 Tablets Daily By Mouth 8)  Freestyle Lite Test  Strp (Glucose Blood) .... Test Blood Sugar 4 Times A Day 9)  Freestyle Lancets  Misc (Lancets) .... Test Blood Sugar 4 Times A Day 10)   Novolog 100 Unit/ml Soln (Insulin Aspart) .... 9am Takes 2-4 Units, 1pm Takes 4 Units, 5pm Takes 6 Units, 9pm Takes 15 Units, Sliding Scale 11)  Hydroxychloroquine Sulfate 200 Mg Tabs (Hydroxychloroquine Sulfate) .... Take 1 Tablet By Mouth Once Daily 12)  Anusol-Hc 25 Mg Supp (Hydrocortisone Acetate) .... Use 1 Suppository 3-4 Times A Day As Needed For Rectal Pain 13)  Lantus 100 Unit/ml Soln (Insulin Glargine) .Marland Kitchen.. 10 Units in Evening.  Allergies: 1)  ! * Mobic  Physical Exam  General:  alert, well-developed, well-nourished, and well-hydrated.  NAD. Head:  Normocephalic and atraumatic without obvious abnormalities. No apparent alopecia or balding. Eyes:  Conjunctiva clear bilaterally.  Ears:  External ear exam shows no significant lesions or deformities.  Otoscopic examination reveals clear canals, tympanic membranes are intact bilaterally without bulging, retraction, inflammation or discharge. Hearing is grossly normal bilaterally. Nose:  External nasal examination shows no deformity or inflammation. Nasal mucosa are pink and moist without lesions or exudates. Mouth:  Oral mucosa and oropharynx without lesions or exudates.  Has false teeth in place altho they are quite lose. Neck:  No deformities, masses, or tenderness noted. Chest Wall:  No deformities, masses, or tenderness noted. Lungs:  normal respiratory effort, no intercostal retractions, no accessory muscle use, and normal breath sounds.   Heart:  normal rate, regular rhythm, and no murmur.   Abdomen:  Bowel sounds positive,abdomen soft and non-tender without masses, organomegaly or hernias noted. Two small echymotic areas on the lower abd wall from insulin injections. Skin:  Minimal diffuse rash of the LEs bilat.   Impression & Recommendations:  Problem # 1:  DIABETES MELLITUS, TYPE II (ICD-250.00) Assessment Unchanged Stable but slightly high.  Change SS as follows: 150-200 2 units of Novolog to 2. 201-250 5 units to  6 251-350 8 units to 10 >350      10 units to 12 Lantus PM, increase 10 units to 12. Her updated medication list for this problem includes:    Novolog 100 Unit/ml Soln (Insulin aspart) .Marland Kitchen... Check sugar before each meal, for glucose 150-200, use 2 units               201-250 6 units               251-350 10 units                  >351 12 untis    Lantus 100 Unit/ml Soln (Insulin glargine) .Marland KitchenMarland KitchenMarland KitchenMarland Kitchen 10 units in evening.  Problem # 2:  RASH AND OTHER NONSPECIFIC SKIN ERUPTION (ICD-782.1) Assessment: Unchanged Per Derm. The following medications were removed from the medication list:    Lidex 0.05 % Crea (Fluocinonide) .Marland Kitchen... Apply to area two times a day until  symptoms improve  Complete Medication List: 1)  Estropipate 1.5 Mg Tabs (Estropipate) .... Take one by mouth daily 2)  Alprazolam 0.5 Mg Tabs (Alprazolam) .... 1/2 to 1 daily three times a day only if needed 3)  Vitamin B-12 Cr 1000 Mcg Tbcr (Cyanocobalamin) .... Take monthly 4)  Nexium 40 Mg Cpdr (Esomeprazole magnesium) .Marland Kitchen.. 1 by mouth every am as needed 5)  Bd Insulin Syringe Ultrafine 31g X 5/16" 0.5 Ml Misc (Insulin syringe-needle u-100) .... Use as directed sliding scale 6)  Levothroid 100 Mcg Tabs (Levothyroxine sodium) .Marland Kitchen.. 1 daily by mouth 7)  Klor-con M20 20 Meq Cr-tabs (Potassium chloride crys cr) .... 2 tablets daily by mouth 8)  Freestyle Lite Test Strp (Glucose blood) .... Test blood sugar 4 times a day 9)  Freestyle Lancets Misc (Lancets) .... Test blood sugar 4 times a day 10)  Novolog 100 Unit/ml Soln (Insulin aspart) .... Check sugar before each meal, for glucose 150-200, use 2 units               201-250 6 units               251-350 10 units                  >351 12 untis 11)  Hydroxychloroquine Sulfate 200 Mg Tabs (Hydroxychloroquine sulfate) .... Take 1 tablet by mouth once daily 12)  Anusol-hc 25 Mg Supp (Hydrocortisone acetate) .... Use 1 suppository 3-4 times a day as needed for rectal pain 13)  Lantus 100 Unit/ml  Soln (Insulin glargine) .Marland Kitchen.. 10 units in evening.  Other Orders: Vit B12 1000 mcg (J3420) Admin of Therapeutic Inj  intramuscular or subcutaneous ([address])  Patient Instructions: 1)  RTC 3 mos for Comp Exam, labs prior. 2)  25 mins spent with pt.   Medication Administration  Injection # 1:    Medication: Vit B12 1000 mcg    Diagnosis: ANEMIA, PERNICIOUS (B12 69) (ICD-281.0)    Route: IM    Site: L deltoid    Exp Date: 09/23/2011    Lot #: [id number]    Mfr: [employer]    Patient tolerated injection without complications    Given by: Sydell Axon LPN (April 09, 2010 4:06 PM)  Orders Added: 1)  Vit B12 1000 mcg [J3420] 2)  Admin of Therapeutic Inj  intramuscular or subcutaneous [96372] 3)  Est. Patient Level IV [81191]    Current Allergies (reviewed today): ! * MOBIC   Medication Administration  Injection # 1:    Medication: Vit B12 1000 mcg    Diagnosis: ANEMIA, PERNICIOUS (B12 69) (ICD-281.0)    Route: IM    Site: L deltoid    Exp Date: 09/23/2011    Lot #: [id number]    Mfr: [employer]    Patient tolerated injection without complications    Given by: Sydell Axon LPN (April 09, 2010 4:06 PM)  Orders Added: 1)  Vit B12 1000 mcg [J3420] 2)  Admin of Therapeutic Inj  intramuscular or subcutaneous [96372] 3)  Est. Patient Level IV [21308]

## 2010-06-24 NOTE — Assessment & Plan Note (Signed)
Summary: B-12 INJ/DUNCAN/CLE  Nurse Visit   Allergies: 1)  ! * Mobic  Medication Administration  Injection # 1:    Medication: Vit B12 1000 mcg    Diagnosis: ANEMIA, PERNICIOUS (B12 69) (ICD-281.0)    Route: IM    Site: R deltoid    Exp Date: 08/2011    Lot #: [id number]    Mfr: [employer]    Patient tolerated injection without complications    Given by: Lowella Petties CMA (January 31, 2010 9:12 AM)  Orders Added: 1)  Vit B12 1000 mcg [J3420] 2)  Admin of Therapeutic Inj  intramuscular or subcutaneous [96372]   Medication Administration  Injection # 1:    Medication: Vit B12 1000 mcg    Diagnosis: ANEMIA, PERNICIOUS (B12 69) (ICD-281.0)    Route: IM    Site: R deltoid    Exp Date: 08/2011    Lot #: [id number]    Mfr: [employer]    Patient tolerated injection without complications    Given by: Lowella Petties CMA (January 31, 2010 9:12 AM)  Orders Added: 1)  Vit B12 1000 mcg [J3420] 2)  Admin of Therapeutic Inj  intramuscular or subcutaneous [91478]

## 2010-06-24 NOTE — Assessment & Plan Note (Signed)
Summary: b12 shot/ alc  Nurse Visit   Allergies: 1)  ! * Mobic  Medication Administration  Injection # 1:    Medication: Vit B12 1000 mcg    Diagnosis: ANEMIA, PERNICIOUS (B12 69) (ICD-281.0)    Route: IM    Site: L deltoid    Exp Date: 02/23/2011    Lot #: [id number]    Mfr: [employer]    Patient tolerated injection without complications    Given by: Sydell Axon LPN (July 09, 2009 2:23 PM)  Orders Added: 1)  Vit B12 1000 mcg [J3420] 2)  Admin of Therapeutic Inj  intramuscular or subcutaneous [96372]   Medication Administration  Injection # 1:    Medication: Vit B12 1000 mcg    Diagnosis: ANEMIA, PERNICIOUS (B12 69) (ICD-281.0)    Route: IM    Site: L deltoid    Exp Date: 02/23/2011    Lot #: [id number]    Mfr: [employer]    Patient tolerated injection without complications    Given by: Sydell Axon LPN (July 09, 2009 2:23 PM)  Orders Added: 1)  Vit B12 1000 mcg [J3420] 2)  Admin of Therapeutic Inj  intramuscular or subcutaneous [64403]

## 2010-06-24 NOTE — Progress Notes (Signed)
Summary: refill request for alprazolam  Phone Note Refill Request Message from:  Fax from Pharmacy  Refills Requested: Medication #1:  ALPRAZOLAM 0.5 MG TABS 1/2 to 1 daily three times a day only if needed   Last Refilled: 12/09/2009 [facility], [phone]  Initial call taken by: Lowella Petties CMA,  January 09, 2010 8:44 AM  Follow-up for Phone Call        Needs appointment this fall to establish with new MD.  Please call in rx.  Follow-up by: Crawford Givens MD,  January 09, 2010 9:02 AM  Additional Follow-up for Phone Call Additional follow up Details #1::        Phone number is incorrect.  Will send letter.  Medication phoned to pharmacy.  Additional Follow-up by: Delilah Shan CMA Zorawar Strollo Dull),  January 09, 2010 11:52 AM    Prescriptions: ALPRAZOLAM 0.5 MG TABS (ALPRAZOLAM) 1/2 to 1 daily three times a day only if needed  #30 x 0   Entered and Authorized by:   Crawford Givens MD   Signed by:   Crawford Givens MD on 01/09/2010   Method used:   Telephoned to ...       [facility] (retail)       [address]       [place], [place]  [address]       Ph: [phone]       Fax: [phone]   RxID:   [id number]

## 2010-06-24 NOTE — Progress Notes (Signed)
Summary: refill request for alprazolam  Phone Note Refill Request Message from:  Fax from Pharmacy  Refills Requested: Medication #1:  ALPRAZOLAM 0.5 MG TABS 1/2 to 1 daily three times a day only if needed   Last Refilled: 06/07/2009 [facility] phone [phone].  Initial call taken by: Lowella Petties CMA,  July 15, 2009 10:18 AM  Follow-up for Phone Call        Called to pharmacy. Follow-up by: Lowella Petties CMA,  July 15, 2009 11:20 AM    Prescriptions: ALPRAZOLAM 0.5 MG TABS (ALPRAZOLAM) 1/2 to 1 daily three times a day only if needed  #30 x 2   Entered and Authorized by:   Shaune Leeks MD   Signed by:   Shaune Leeks MD on 07/15/2009   Method used:   Telephoned to ...       [facility] (retail)       [address]       [place], [place]  [address]       Ph: [phone]       Fax: [phone]   RxID:   [id number]

## 2010-06-24 NOTE — Assessment & Plan Note (Signed)
Summary: terrible itching, thinks it is shingles   Vital Signs:  Patient profile:   76 year old female Weight:      125.13 pounds BMI:     23.73 Temp:     97.9 degrees F oral Pulse rate:   88 / minute Pulse rhythm:   regular BP sitting:   116 / 62  (left arm) Cuff size:   regular  Vitals Entered By: Delilah Shan CMA Duncan Dull) (June 11, 2009 12:22 PM) CC: Terrible itching / ? shingles   History of Present Illness: 76 yo here for acute visit for itchy rash on arms bilaterally, back and chest.  She was told it was shingles.  Started two days ago.  She felt they looked like pus filled pimples when they started. Not painful.  Seems to appear in all places at one, did not spread from one location. Lives alone, no one else she knows has same rash. Has never had anything like this before. No change in detergents, foods, or other products. No change in medicaiton. No SOB or difficulty swallowing. NO fevers, but she reports chills recently.  Current Medications (verified): 1)  Estropipate 1.5 Mg Tabs (Estropipate) .... Take One By Mouth Daily 2)  Alprazolam 0.5 Mg Tabs (Alprazolam) .... 1/2 To 1 Daily Three Times A Day Only If Needed 3)  Vitamin B-12 Cr 1000 Mcg  Tbcr (Cyanocobalamin) .... Take Monthly 4)  Nexium 40 Mg  Cpdr (Esomeprazole Magnesium) .Marland Kitchen.. 1 By Mouth Every Am As Needed 5)  Bd Insulin Syringe Ultrafine 31g X 5/16" 0.5 Ml  Misc (Insulin Syringe-Needle U-100) .... Use As Directed Sliding Scale 6)  Levothroid 100 Mcg  Tabs (Levothyroxine Sodium) .Marland Kitchen.. 1 Daily By Mouth 7)  Klor-Con M20 20 Meq  Cr-Tabs (Potassium Chloride Crys Cr) .... 2 Tablets Daily By Mouth 8)  Freestyle Lite Test  Strp (Glucose Blood) .... Test Blood Sugar 4 Times A Day 9)  Freestyle Lancets  Misc (Lancets) .... Test Blood Sugar 4 Times A Day 10)  Novolog 100 Unit/ml Soln (Insulin Aspart) .... 9am Takes 2-4 Units, 1pm Takes 4 Units, 5pm Takes 6 Units, 9pm Takes 15 Units 11)  Hydroxychloroquine Sulfate 200 Mg  Tabs (Hydroxychloroquine Sulfate) .... Take 1 Tablet By Mouth Once Daily 12)  Anusol-Hc 25 Mg Supp (Hydrocortisone Acetate) .... Use 1 Suppository 3-4 Times A Day As Needed For Rectal Pain 13)  Lantus 100 Unit/ml Soln (Insulin Glargine) .Marland Kitchen.. 15 Units in Evening. 14)  Keflex 250 Mg Caps (Cephalexin) .Marland Kitchen.. 1 Tab By Mouth Three Times A Day X 10 Days 15)  Lidex 0.05 % Crea (Fluocinonide) .... Apply To Area Two Times A Day Until Symptoms Improve  Allergies: 1)  ! * Mobic  Review of Systems      See HPI General:  Denies fever. ENT:  Denies difficulty swallowing. Resp:  Denies shortness of breath and wheezing. GI:  Denies abdominal pain, change in bowel habits, and nausea.  Physical Exam  General:  alert, well-developed, well-nourished, and well-hydrated.  NAD Wt up 1 pound. Skin:  Scattered lesions on arms, legs and back, raised, appear consistent with urticaria. No rashes or lesions in between fingers, toes, on wrist or waist. Psych:  normally interactive and good eye contact.     Impression & Recommendations:  Problem # 1:  RASH AND OTHER NONSPECIFIC SKIN ERUPTION (ICD-782.1) Assessment New Uncertain etiology or rash.  Although very itchy, does not appear to be scabies or contact dermatitis.  Looks most consistent with urticaria but  she has not had any contact with anything new and has no known allergies according to patient.  Could be bacterial as she reports they started out "pus" filled.  Pt is poor historian, so will cover with Keflex for bacterial and Lidex for possible allergic dermatitis.  Red flags given for immediate follow up.  Asked her to call next week to give update on her condition. Her updated medication list for this problem includes:    Lidex 0.05 % Crea (Fluocinonide) .Marland Kitchen... Apply to area two times a day until symptoms improve  Complete Medication List: 1)  Estropipate 1.5 Mg Tabs (Estropipate) .... Take one by mouth daily 2)  Alprazolam 0.5 Mg Tabs (Alprazolam) ....  1/2 to 1 daily three times a day only if needed 3)  Vitamin B-12 Cr 1000 Mcg Tbcr (Cyanocobalamin) .... Take monthly 4)  Nexium 40 Mg Cpdr (Esomeprazole magnesium) .Marland Kitchen.. 1 by mouth every am as needed 5)  Bd Insulin Syringe Ultrafine 31g X 5/16" 0.5 Ml Misc (Insulin syringe-needle u-100) .... Use as directed sliding scale 6)  Levothroid 100 Mcg Tabs (Levothyroxine sodium) .Marland Kitchen.. 1 daily by mouth 7)  Klor-con M20 20 Meq Cr-tabs (Potassium chloride crys cr) .... 2 tablets daily by mouth 8)  Freestyle Lite Test Strp (Glucose blood) .... Test blood sugar 4 times a day 9)  Freestyle Lancets Misc (Lancets) .... Test blood sugar 4 times a day 10)  Novolog 100 Unit/ml Soln (Insulin aspart) .... 9am takes 2-4 units, 1pm takes 4 units, 5pm takes 6 units, 9pm takes 15 units 11)  Hydroxychloroquine Sulfate 200 Mg Tabs (Hydroxychloroquine sulfate) .... Take 1 tablet by mouth once daily 12)  Anusol-hc 25 Mg Supp (Hydrocortisone acetate) .... Use 1 suppository 3-4 times a day as needed for rectal pain 13)  Lantus 100 Unit/ml Soln (Insulin glargine) .Marland Kitchen.. 15 units in evening. 14)  Keflex 250 Mg Caps (Cephalexin) .Marland Kitchen.. 1 tab by mouth three times a day x 10 days 15)  Lidex 0.05 % Crea (Fluocinonide) .... Apply to area two times a day until symptoms improve Prescriptions: LIDEX 0.05 % CREA (FLUOCINONIDE) apply to area two times a day until symptoms improve  #15 g x 0   Entered and Authorized by:   Ruthe Mannan MD   Signed by:   Ruthe Mannan MD on 06/11/2009   Method used:   Electronically to        [facility] (retail)       [address]       [place], [place]  [address]       Ph: [phone]       Fax: [phone]   RxID:   [id number] KEFLEX 250 MG CAPS (CEPHALEXIN) 1 tab by mouth three times a day x 10 days  #30 x 0   Entered and Authorized by:   Ruthe Mannan MD   Signed by:   Ruthe Mannan MD on 06/11/2009   Method used:   Electronically to        [facility] (retail)       [address]       [place], [place]  [address]       Ph: [phone]       Fax: [phone]   RxID:   [id number]   Current Allergies (reviewed today): ! [facility]

## 2010-06-24 NOTE — Letter (Signed)
Summary: Generic Letter  [place] at [facility]  [address] [place], [place] [address]   Phone: [phone]  Fax: [phone]    02/03/2010    Gina Andersen [address] [place], [place]  [address]    Dear Ms. GRIFFIN,   We have been unable to reach you by phone.  If your phone number has changed, please notify our office as it is important that we be able to contact  you if necessary.    You need to make a 30 minute appointment to see Dr. Hetty Ely or Dr. Para March in the near future.  You will need a lab appointment prior to that visit.  Please call in to the office to report your current phone number and ask for a lab appointment AND an office visit appointment (30 minutes).       Sincerely,   Lugene Fuquay CMA (AAMA) for Dwana Curd. Para March, M.D.

## 2010-06-24 NOTE — Assessment & Plan Note (Signed)
Summary: B-12 INJ/Gina Andersen/CLE  Nurse Visit   Allergies: 1)  ! * Mobic  Medication Administration  Injection # 1:    Medication: Vit B12 1000 mcg    Diagnosis: ANEMIA, PERNICIOUS (B12 69) (ICD-281.0)    Route: IM    Site: L deltoid    Exp Date: 03/26/2011    Lot #: [id number]    Mfr: American Regent    Patient tolerated injection without complications    Given by: Lewanda Rife LPN (November 23, 979 4:32 PM)  Orders Added: 1)  Vit B12 1000 mcg [J3420] 2)  Admin of Therapeutic Inj  intramuscular or subcutaneous [[id number]]

## 2010-06-24 NOTE — Progress Notes (Signed)
Summary: alprazolam   Phone Note Refill Request Message from:  Fax from Pharmacy on April 29, 2010 1:04 PM  Refills Requested: Medication #1:  ALPRAZOLAM 0.5 MG TABS 1/2 to 1 daily three times a day only if needed   Last Refilled: 03/27/2010 Refill request from [facility]. [phone].  Initial call taken by: Melody Comas,  April 29, 2010 1:05 PM  Follow-up for Phone Call        Rx phoned to pharmacy.  Follow-up by: Melody Comas,  April 29, 2010 4:28 PM    Prescriptions: ALPRAZOLAM 0.5 MG TABS (ALPRAZOLAM) 1/2 to 1 daily three times a day only if needed  #30 x 1   Entered and Authorized by:   Shaune Leeks MD   Signed by:   Shaune Leeks MD on 04/29/2010   Method used:   Telephoned to ...       [facility] (retail)       [address]       [place], [place]  [address]       Ph: [phone]       Fax: [phone]   RxID:   [id number]

## 2010-06-24 NOTE — Progress Notes (Signed)
Summary: still itching  Phone Note Call from Patient Call back at [phone]   Caller: Son- Aurther Loft Summary of Call: Pt's son states that the rash she has had started to clear up but has now gotten worse again- itching really bad.  She is still using the lidex cream. Please advise.  She is coming in for a  B12 shot tomorrow.  Uses [facility]. Initial call taken by: Lowella Petties CMA,  July 08, 2009 4:17 PM  Follow-up for Phone Call        It seems like an allergic reaction but we do not know the trigger.  If she is ok with it, I would like to refer to derm. Follow-up by: Ruthe Mannan MD,  July 08, 2009 4:25 PM  Additional Follow-up for Phone Call Additional follow up Details #1::        Son Aurther Loft) says that he sees [facility] and will call for an appointment for Jihan at that practice.  Aurther Loft was told that if he needs our assistance to please call back. Additional Follow-up by: Delilah Shan CMA Duncan Dull),  July 08, 2009 4:55 PM

## 2010-06-24 NOTE — Letter (Signed)
Summary: Generic Letter  [place] at [facility]  [address] [place], [place] [address]   Phone: [phone]  Fax: [phone]    01/09/2010    Gina Andersen [address] [place], [place]  [address]    Dear Ms. Gina Andersen,  Apparently the phone number that we have to contact you is no longer correct.  I was told that you no longer lived at that residence.  Your recent request for a refill on your Alprazolam was sent to your pharmacy but Dr. Para March requests that you make an appointment this Fall to see one of the new doctors that will be taking Dr. Lorenza Chick patients.  Please call into the office to do 2 things:   1.  Give [place] a correct phone number to contact you.   2.  Make an appointment for sometime this Fall.  Thank you.   Sincerely,   Lugene Fuquay CMA (AAMA)

## 2010-06-24 NOTE — Assessment & Plan Note (Signed)
Summary: B-12 SHOT/Shaindy Reader/CLE  Nurse Visit   Allergies: 1)  ! * Mobic  Medication Administration  Injection # 1:    Medication: Vit B12 1000 mcg    Diagnosis: ANEMIA, PERNICIOUS (B12 69) (ICD-281.0)    Route: IM    Site: R deltoid    Exp Date: 03/26/2011    Lot #: [id number]    Mfr: American Regent    Patient tolerated injection without complications    Given by: Sydell Axon LPN (August 07, 2009 4:35 PM)  Orders Added: 1)  Vit B12 1000 mcg [J3420] 2)  Admin of Therapeutic Inj  intramuscular or subcutaneous [[id number]]   Medication Administration  Injection # 1:    Medication: Vit B12 1000 mcg    Diagnosis: ANEMIA, PERNICIOUS (B12 69) (ICD-281.0)    Route: IM    Site: R deltoid    Exp Date: 03/26/2011    Lot #: [id number]    Mfr: American Regent    Patient tolerated injection without complications    Given by: Sydell Axon LPN (August 07, 2009 4:35 PM)  Orders Added: 1)  Vit B12 1000 mcg [J3420] 2)  Admin of Therapeutic Inj  intramuscular or subcutaneous [[id number]]

## 2010-06-24 NOTE — Progress Notes (Signed)
Summary: pt stopped keflex  Phone Note Call from Patient   Caller: Son- Maceo Pro   [phone] Summary of Call: Pt's son just wanted to let you know that pt stopped the keflex that she was taking for her skin infection.  It was causing her to vomit, but he said the infection is clearing up with the cream. Initial call taken by: Lowella Petties CMA,  June 21, 2009 4:49 PM  Follow-up for Phone Call        Thank you. Follow-up by: Ruthe Mannan MD,  June 21, 2009 5:45 PM

## 2010-06-24 NOTE — Progress Notes (Signed)
Summary: wants order for mammogram   Phone Note Call from Patient   Caller: Son Call For: Gina Leeks MD Summary of Call: Son is asking if paient could get an order for his mom to get a mammogram. She wants to go to the [facility] in [place]. Their number is [phone].  Initial call taken by: Melody Comas,  April 11, 2010 2:55 PM  Follow-up for Phone Call        [facility] at [facility] on 04/28/2010 at 1:00pm order faxed and mailed to the patient. Follow-up by: Carlton Adam,  April 14, 2010 12:17 PM  New Problems: OTHER SCREENING MAMMOGRAM (ICD-V76.12)   New Problems: OTHER SCREENING MAMMOGRAM (ICD-V76.12)   PAP Screening:    Last PAP smear:  06/25/2002  Mammogram Screening:    Last Mammogram:  07/24/2002  Osteoporosis Risk Assessment:  Risk Factors for Fracture or Low Bone Density:   Race (White or Asian):     yes   Smoking status:       never   Thyroid replacement:     yes   Thyroid disease:     yes  Immunization & Chemoprophylaxis:    Tetanus vaccine: Td  (07/25/2003)    Influenza vaccine: Fluvax 3+  (03/21/2009)    Pneumovax: Pneumovax  (05/25/1992) Pls let pt know I'm glad she finally agrees to have a mammogram. Have done referral. Gina Leeks MD  April 14, 2010 11:30 AM

## 2010-06-24 NOTE — Progress Notes (Signed)
Summary: alprazolam   Phone Note Refill Request Message from:  Fax from Pharmacy on March 11, 2010 1:33 PM  Refills Requested: Medication #1:  ALPRAZOLAM 0.5 MG TABS 1/2 to 1 daily three times a day only if needed   Last Refilled: 02/03/2010 Refill request from [facility]. [phone]  Initial call taken by: Melody Comas,  March 11, 2010 1:40 PM  Follow-up for Phone Call        please call in.  thanks.  Follow-up by: Crawford Givens MD,  March 11, 2010 1:56 PM  Additional Follow-up for Phone Call Additional follow up Details #1::        Medication phoned to pharmacy.  Additional Follow-up by: Delilah Shan CMA Duncan Dull),  March 11, 2010 2:13 PM    Prescriptions: ALPRAZOLAM 0.5 MG TABS (ALPRAZOLAM) 1/2 to 1 daily three times a day only if needed  #30 x 0   Entered and Authorized by:   Crawford Givens MD   Signed by:   Crawford Givens MD on 03/11/2010   Method used:   Telephoned to ...       [facility] (retail)       [address]       [place], [place]  [address]       Ph: [phone]       Fax: [phone]   RxID:   [id number]

## 2010-06-24 NOTE — Progress Notes (Signed)
Summary: refill request for alprazolam  Phone Note Refill Request Message from:  Fax from Pharmacy  Refills Requested: Medication #1:  ALPRAZOLAM 0.5 MG TABS 1/2 to 1 daily three times a day only if needed   Last Refilled: 09/11/2009 [facility], [phone].  Initial call taken by: Lowella Petties CMA,  Oct 09, 2009 10:24 AM  Follow-up for Phone Call        Called to pharmacy. Follow-up by: Lowella Petties CMA,  Oct 09, 2009 2:04 PM    Prescriptions: ALPRAZOLAM 0.5 MG TABS (ALPRAZOLAM) 1/2 to 1 daily three times a day only if needed  #30 x 0   Entered and Authorized by:   Shaune Leeks MD   Signed by:   Shaune Leeks MD on 10/09/2009   Method used:   Telephoned to ...       [facility] (retail)       [address]       [place], [place]  [address]       Ph: [phone]       Fax: [phone]   RxID:   [id number]

## 2010-06-24 NOTE — Progress Notes (Signed)
Summary: refill request for alprazolam  Phone Note Refill Request Message from:  Fax from Pharmacy  Refills Requested: Medication #1:  ALPRAZOLAM 0.5 MG TABS 1/2 to 1 daily three times a day only if needed   Last Refilled: 04/16/2009 [facility] [phone]  Initial call taken by: Lowella Petties CMA,  June 07, 2009 3:06 PM  Follow-up for Phone Call        px written on EMR for call in  Follow-up by: Judith Part MD,  June 07, 2009 4:29 PM  Additional Follow-up for Phone Call Additional follow up Details #1::        [facility] Additional Follow-up by: Lowella Petties CMA,  June 07, 2009 4:40 PM    Prescriptions: ALPRAZOLAM 0.5 MG TABS (ALPRAZOLAM) 1/2 to 1 daily three times a day only if needed  #30 x 0   Entered and Authorized by:   Judith Part MD   Signed by:   Judith Part MD on 06/07/2009   Method used:   Telephoned to ...       [facility] (retail)       [address]       [place], [place]  [address]       Ph: [phone]       Fax: [phone]   RxID:   [id number]

## 2010-06-26 NOTE — Letter (Signed)
Summary: Dementia Screening Interview  Dementia Screening Interview   Imported By: Beau Fanny 05/08/2010 15:16:40  _____________________________________________________________________  External Attachment:    Type:   Image     Comment:   External Document

## 2010-06-26 NOTE — Progress Notes (Signed)
Summary: pt wants zostavax  Phone Note Call from Patient   Caller: Son Summary of Call: Pt is asking to get zostavax vaccine, we have that here.  [facility] told her it would be 3 weeks before they will have it. Initial call taken by: Lowella Petties CMA, AAMA,  May 15, 2010 10:10 AM  Follow-up for Phone Call        Fine with me for her to get it here, if that can be arranged. Thank you.  Pls insure she is aware of insurance coverage. Follow-up by: Shaune Leeks MD,  May 15, 2010 10:38 AM  Additional Follow-up for Phone Call Additional follow up Details #1::        I had told pt's son to check on insurance coverage and call back.  He has not yet called back.          Lowella Petties CMA, AAMA  May 20, 2010 4:00 PM

## 2010-06-26 NOTE — Assessment & Plan Note (Signed)
Summary: CHECK MEMORY/ ? SHINGLES- 30 MINUTES   Vital Signs:  Patient profile:   76 year old female Weight:      125.25 pounds Temp:     98.0 degrees F oral Pulse rate:   72 / minute Pulse rhythm:   regular BP sitting:   128 / 60  (left arm) Cuff size:   regular  Vitals Entered By: Sydell Axon LPN (May 08, 2010 11:15 AM) CC: Check memory, ? shinges on left leg, has had shingles 3 times now, needs to reschedule Mammogram   History of Present Illness: Pt is here with her son for three things: 1. He is concerned about her developing Alzheimer's as it has affected a number of people in her family. 2. Is worried about her again having shingles. She was supposedly seen and evaluated by a doctor in [place] at the [facility] said to be a dermatologist who said she had shingles of the leg. The two previous incidents seen at two different locations of the lower left leg with mild scarring in a circular pattern approx 2cm in diam, one on the lateral mid shin area, the other on the medial. The current area of interest is a single lesion of 3mm diam approx mid shin justmedial to midline, no vessicular nature but mild surrounding erythema. No swelling seen.  3. Needs mammo rescheduled. Pt was previously scheduled but son cancelled when the pt c/o  mammos  being painful and not wanting to have it done. 4. Recheck of her insulin. The son is now diabbetic and has been to a dietician and she is eating better. Nos have been significantly better throughout the day.  Problems Prior to Update: 1)  Other Screening Mammogram  (ICD-V76.12) 2)  Rash and Other Nonspecific Skin Eruption  (ICD-782.1) 3)  Hemorrhoids, With Bleeding  (ICD-455.8) 4)  Hand Pain, Bilateral  (ICD-729.5) 5)  Leg Pain, Left  (ICD-729.5) 6)  Abdominal Pain, Right Upper Quadrant  (ICD-789.01) 7)  Pancytopenia  (ICD-284.1) 8)  Mixed Connective Tissue Disease  (ICD-710.9) 9)  Hepatitis B, Hx of  (ICD-V12.09) 10)  Examination, Medical Nec,  Admn Purposes  (ICD-V70.3) 11)  Leukopenia, Mild  (ICD-288.50) 12)  Dermatitis, Contact, Nos  (ICD-692.9) 13)  Hyperlipidemia  (ICD-272.4) 14)  Anemia, Pernicious (B12 69)  (ICD-281.0) 15)  Disorder, Menopausal Nos  (ICD-627.9) 16)  Diabetes Mellitus, Type II  (ICD-250.00) 17)  Anxiety  (ICD-300.00) 18)  Deafness (READS LIPS)  (ICD-389.9) 19)  Hypothyroidism  (ICD-244.9)  Medications Prior to Update: 1)  Estropipate 1.5 Mg Tabs (Estropipate) .... Take One By Mouth Daily 2)  Alprazolam 0.5 Mg Tabs (Alprazolam) .... 1/2 To 1 Daily Three Times A Day Only If Needed 3)  Vitamin B-12 Cr 1000 Mcg  Tbcr (Cyanocobalamin) .... Take Monthly 4)  Nexium 40 Mg  Cpdr (Esomeprazole Magnesium) .Marland Kitchen.. 1 By Mouth Every Am As Needed 5)  Bd Insulin Syringe Ultrafine 31g X 5/16" 0.5 Ml  Misc (Insulin Syringe-Needle U-100) .... Use As Directed Sliding Scale 6)  Levothroid 100 Mcg  Tabs (Levothyroxine Sodium) .Marland Kitchen.. 1 Daily By Mouth 7)  Klor-Con M20 20 Meq  Cr-Tabs (Potassium Chloride Crys Cr) .... 2 Tablets Daily By Mouth 8)  Freestyle Lite Test  Strp (Glucose Blood) .... Test Blood Sugar 4 Times A Day 9)  Freestyle Lancets  Misc (Lancets) .... Test Blood Sugar 4 Times A Day 10)  Novolog 100 Unit/ml Soln (Insulin Aspart) .... Check Sugar Before Each Meal, For Glucose 150-200, Use 2 Units  201-250 6 Units               251-350 10 Units                  >351 12 Untis 11)  Hydroxychloroquine Sulfate 200 Mg Tabs (Hydroxychloroquine Sulfate) .... Take 1 Tablet By Mouth Once Daily 12)  Anusol-Hc 25 Mg Supp (Hydrocortisone Acetate) .... Use 1 Suppository 3-4 Times A Day As Needed For Rectal Pain 13)  Lantus 100 Unit/ml Soln (Insulin Glargine) .Marland Kitchen.. 10 Units in Evening.  Current Medications (verified): 1)  Estropipate 1.5 Mg Tabs (Estropipate) .... Take One By Mouth Daily 2)  Alprazolam 0.5 Mg Tabs (Alprazolam) .... 1/2 To 1 Daily Three Times A Day Only If Needed 3)  Vitamin B-12 Cr 1000 Mcg  Tbcr  (Cyanocobalamin) .... Take Monthly 4)  Nexium 40 Mg  Cpdr (Esomeprazole Magnesium) .Marland Kitchen.. 1 By Mouth Every Am As Needed 5)  Bd Insulin Syringe Ultrafine 31g X 5/16" 0.5 Ml  Misc (Insulin Syringe-Needle U-100) .... Use As Directed Sliding Scale 6)  Levothroid 100 Mcg  Tabs (Levothyroxine Sodium) .Marland Kitchen.. 1 Daily By Mouth 7)  Klor-Con M20 20 Meq  Cr-Tabs (Potassium Chloride Crys Cr) .... 2 Tablets Daily By Mouth 8)  Freestyle Lite Test  Strp (Glucose Blood) .... Test Blood Sugar 4 Times A Day 9)  Freestyle Lancets  Misc (Lancets) .... Test Blood Sugar 4 Times A Day 10)  Novolog 100 Unit/ml Soln (Insulin Aspart) .... Check Sugar Before Each Meal, For Glucose 150-200, Use 2 Units               201-250 6 Units               251-350 10 Units                  >351 12 Untis 11)  Hydroxychloroquine Sulfate 200 Mg Tabs (Hydroxychloroquine Sulfate) .... Take 1 Tablet By Mouth Once Daily 12)  Anusol-Hc 25 Mg Supp (Hydrocortisone Acetate) .... Use 1 Suppository 3-4 Times A Day As Needed For Rectal Pain 13)  Lantus 100 Unit/ml Soln (Insulin Glargine) .Marland Kitchen.. 12 Units in Evening.  Allergies: 1)  ! * Mobic  Physical Exam  General:  alert, well-developed, well-nourished, and well-hydrated.  NAD. Head:  Normocephalic and atraumatic without obvious abnormalities. No apparent alopecia or balding. Eyes:  Conjunctiva clear bilaterally.  Ears:  External ear exam shows no significant lesions or deformities.  Otoscopic examination reveals clear canals, tympanic membranes are intact bilaterally without bulging, retraction, inflammation or discharge. Hearing is grossly normal bilaterally. Nose:  External nasal examination shows no deformity or inflammation. Nasal mucosa are pink and moist without lesions or exudates. Mouth:  Oral mucosa and oropharynx without lesions or exudates.  Has false teeth in place altho they are quite lose. Neck:  No deformities, masses, or tenderness noted. Lungs:  normal respiratory effort, no  intercostal retractions, no accessory muscle use, and normal breath sounds.   Heart:  normal rate, regular rhythm, and no murmur.   Extremities:  No clubbing, cyanosis, edema, or deformity noted with normal full range of motion of all joints.   Skin:  Minimal diffuse rash of the LEs bilat that is unchanged, small 3mm lesion with erythem halo, macular w/o swelling or vessiculation, mildly tender to touch. The areas of involvement both today and previously are not linear or particularly dermatomal. Inguinal Nodes:  No significant adenopathy Psych:  normally interactive with good eye contact.   MMSE and MMX  difficult to do due to hearing difficulties. Clock drawing perfect.   Impression & Recommendations:  Problem # 1:  DEMENTIA (ICD-294.8) Assessment New In my opinion and with limited testing available due to her inability to hear, I do not think Ms Moehle has significant dementia, and certainly nothing worth treating with medication at this point. Her AD8 accomplished by her son was significantly positive but MMSE that I felt she heard and understood was done quite well She missed the middle word of the three word recall, but otherwise had no probs. Her clock was perfect. She graduated from the 11th grade and could not spell WORLD backwards or subtract serial sevens but I do not think she understood the task.  In short, I do not think she has significant dementia and will not leave this on  her problem list  Problem # 2:  LEG PAIN, LEFT (ICD-729.5) Assessment: Unchanged I do not think the lesion I see today is shingles. SHe has had lesions break out in the past and these really look no different.  Apply cream she has been given in the past. Would suggest a Zostavax in the future when the current lesion resolves.  Problem # 3:  OTHER SCREENING MAMMOGRAM (ICD-V76.12) Assessment: Unchanged Mammo rescheduled. Encouraged to keep the appt irrespective of her percieved worry of  discomfort. Orders: Radiology Referral (Radiology)  Problem # 4:  DIABETES MELLITUS, TYPE II (ICD-250.00) Assessment: Improved New dosing and current diet and activity sounds to have improved nos. Her updated medication list for this problem includes:    Novolog 100 Unit/ml Soln (Insulin aspart) .Marland Kitchen... Check sugar before each meal, for glucose 150-200, use 2 units               201-250 6 units               251-350 10 units                  >351 12 untis    Lantus 100 Unit/ml Soln (Insulin glargine) .Marland Kitchen... 12 units in evening.  Labs Reviewed: Creat: 0.6 (02/18/2009)   Microalbumin: 5.3 (02/22/2005) Reviewed HgBA1c results: 8.2 (02/18/2009)  7.2 (01/09/2009)  Complete Medication List: 1)  Estropipate 1.5 Mg Tabs (Estropipate) .... Take one by mouth daily 2)  Alprazolam 0.5 Mg Tabs (Alprazolam) .... 1/2 to 1 daily three times a day only if needed 3)  Vitamin B-12 Cr 1000 Mcg Tbcr (Cyanocobalamin) .... Take monthly 4)  Nexium 40 Mg Cpdr (Esomeprazole magnesium) .Marland Kitchen.. 1 by mouth every am as needed 5)  Bd Insulin Syringe Ultrafine 31g X 5/16" 0.5 Ml Misc (Insulin syringe-needle u-100) .... Use as directed sliding scale 6)  Levothroid 100 Mcg Tabs (Levothyroxine sodium) .Marland Kitchen.. 1 daily by mouth 7)  Klor-con M20 20 Meq Cr-tabs (Potassium chloride crys cr) .... 2 tablets daily by mouth 8)  Freestyle Lite Test Strp (Glucose blood) .... Test blood sugar 4 times a day 9)  Freestyle Lancets Misc (Lancets) .... Test blood sugar 4 times a day 10)  Novolog 100 Unit/ml Soln (Insulin aspart) .... Check sugar before each meal, for glucose 150-200, use 2 units               201-250 6 units               251-350 10 units                  >351 12 untis 11)  Hydroxychloroquine Sulfate 200 Mg Tabs (Hydroxychloroquine  sulfate) .... Take 1 tablet by mouth once daily 12)  Anusol-hc 25 Mg Supp (Hydrocortisone acetate) .... Use 1 suppository 3-4 times a day as needed for rectal pain 13)  Lantus 100 Unit/ml Soln (Insulin  glargine) .Marland Kitchen.. 12 units in evening.  Patient Instructions: 1)  Refer for Mammo 2)  Get shingles shot when this situation improves. (Zostavax)   Orders Added: 1)  Radiology Referral [Radiology] 2)  Est. Patient Level IV [04540]    Current Allergies (reviewed today): ! * MOBIC

## 2010-06-26 NOTE — Miscellaneous (Signed)
Summary: zostavax at [facility]  Clinical Lists Changes  Observations: Added new observation of ZOSTAVAX: Zostavax (06/13/2010 15:34)      Immunization History:  Zostavax History:    Zostavax # 1:  zostavax (06/13/2010) Given at [facility].                 Lowella Petties CMA, AAMA  June 20, 2010 3:34 PM

## 2010-06-26 NOTE — Assessment & Plan Note (Signed)
Summary: B12 SHOT / LFW  Nurse Visit   Allergies: 1)  ! * Mobic  Medication Administration  Injection # 1:    Medication: Vit B12 1000 mcg    Diagnosis: ANEMIA, PERNICIOUS (B12 69) (ICD-281.0)    Route: IM    Site: R deltoid    Exp Date: 02/23/2012    Lot #: [id number]    Mfr: [employer]ent    Patient tolerated injection without complications    Given by: Lewanda Rife LPN (June 06, 2010 12:52 PM)  Orders Added: 1)  Vit B12 1000 mcg [J3420] 2)  Admin of Therapeutic Inj  intramuscular or subcutaneous [47829]

## 2010-06-26 NOTE — Progress Notes (Signed)
Summary: wants order for zostavax  Phone Note Call from Patient   Caller: Son  Maceo Pro [phone] Summary of Call: Pt would like to have a script for zostavax sent to [facility].  Her insurance will pay for this.  Son says it's ok to wait until you are back in the office. Initial call taken by: Lowella Petties CMA, AAMA,  June 12, 2010 2:21 PM  Follow-up for Phone Call        Done. Follow-up by: Shaune Leeks MD,  June 12, 2010 11:33 PM  Additional Follow-up for Phone Call Additional follow up Details #1::        Advised pts son. Additional Follow-up by: Lowella Petties CMA, AAMA,  June 13, 2010 8:07 AM    New/Updated Medications: ZOSTAVAX 11914 UNT/0.65ML SOLR (ZOSTER VACCINE LIVE) administer one immun Prescriptions: ZOSTAVAX 78295 UNT/0.65ML SOLR (ZOSTER VACCINE LIVE) administer one immun  #1 x 0   Entered and Authorized by:   Shaune Leeks MD   Signed by:   Shaune Leeks MD on 06/12/2010   Method used:   Electronically to        [facility] (retail)       [address]       [place], [place]  [address]       Ph: [phone]       Fax: [phone]   RxID:   [id number]

## 2010-06-27 ENCOUNTER — Encounter: Payer: Self-pay | Admitting: Family Medicine

## 2010-06-27 ENCOUNTER — Ambulatory Visit: Payer: Medicare Other | Admitting: Family Medicine

## 2010-06-27 ENCOUNTER — Telehealth: Payer: Self-pay | Admitting: Family Medicine

## 2010-06-27 DIAGNOSIS — L2089 Other atopic dermatitis: Secondary | ICD-10-CM | POA: Insufficient documentation

## 2010-06-28 DIAGNOSIS — B829 Intestinal parasitism, unspecified: Secondary | ICD-10-CM

## 2010-06-28 DIAGNOSIS — L2089 Other atopic dermatitis: Secondary | ICD-10-CM

## 2010-07-02 NOTE — Assessment & Plan Note (Signed)
Summary: Rash on Leg   Vital Signs:  Patient Profile:   76 Years Old Female CC:      Rash on left leg under knee Height:     61 inches Weight:      129 pounds Temp:     98.2 degrees F oral Pulse rate:   88 / minute Pulse rhythm:   regular Resp:     14 per minute  Pt. in pain?   no                   Current Allergies: ! * MOBICHistory of Present Illness History from: patient Reason for visit: see chief complaint Chief Complaint: Rash on left leg under knee History of Present Illness: The patient is presenting today for a rash that she says has been present under the left leg and under the knee for the past several days has been getting worse. She reports that she has noticed dry skin over her arms shoulders legs and knees. She reports that she has been concerned that it could be shingles. She reports that she never has been diagnosed with shingles in the past. The patient reports that she has not been outside and mostly at home but she does have pets that live in the home. She lives with her son. The patient denies having fever or chills. She reports that her blood sugars have been high over 200-400. She reports that she is taking her medications without difficulty. She reports no fever or chills. She also reports no chest pain or shortness of breath. She also reports that she has had some allergy symptoms of runny nose and joint pain. She's having some sleeping problems as well.  REVIEW OF SYSTEMS Constitutional Symptoms       Complains of night sweats.     Denies fever, chills, weight loss, weight gain, and fatigue.  Eyes       Denies change in vision, eye pain, eye discharge, glasses, contact lenses, and eye surgery. Ear/Nose/Throat/Mouth       Complains of hearing loss/aids and frequent runny nose.      Denies change in hearing, ear pain, ear discharge, dizziness, frequent nose bleeds, sinus problems, sore throat, hoarseness, and tooth pain or bleeding.  Respiratory  Denies dry cough, productive cough, wheezing, shortness of breath, asthma, bronchitis, and emphysema/COPD.  Cardiovascular       Complains of tires easily with exhertion.      Denies murmurs and chest pain.    Gastrointestinal       Denies stomach pain, nausea/vomiting, diarrhea, constipation, blood in bowel movements, and indigestion. Genitourniary       Denies painful urination, kidney stones, and loss of urinary control. Neurological       Denies paralysis, seizures, and fainting/blackouts. Musculoskeletal       Complains of muscle pain and joint pain.      Denies joint stiffness, decreased range of motion, redness, swelling, muscle weakness, and gout.  Skin       Complains of unusual moles/lumps or sores.      Denies bruising and hair/skin or nail changes.  Psych       Complains of sleep problems.      Denies mood changes, temper/anger issues, anxiety/stress, speech problems, and depression.  Past History:  Family History: Last updated: 09/08/2006 Father dec 85 Pneumonia Fx hip Mother dec 73 Renal Dz DM Htn Brother dec Stroke DM Brother dec CVA Brother A DM  Social History: Last updated: 06/27/2010  Occupation:on disability Retired Customer service manager Injury via social security Divorced 1979  4 children, 2 marriages reported ending in divorce Never Smoked Alcohol use-no Drug use-no The patient currently is living with her son who is her primary caretaker.  Risk Factors: Alcohol Use: 0 (11/27/2008) Exercise: no (12/01/2006)  Risk Factors: Smoking Status: never (11/27/2008) Passive Smoke Exposure: no (12/01/2006)  Past Medical History: Hypothyroidism (11/01/1995) Anxiety (11/01/1995) Diabetes mellitus, type 2, insulin-requiring (05/25/1996) Hyperlipidemia (06/25/2002) Atopic Dermatitis  Past Surgical History: Reviewed history from 05/23/2008 and no changes required. Hyst OV intact 34yoa R Carpal Tunnel L Trigger Finger  L Ganglionectomy ECHO sm pericard effusion  Tr TR,MR  11/20/2003 MRI Brain w/ and w/o no mass/infarct  sm vess dz  07/10/2005 Formal DOT Driving Eval 01/23/4781 HOSP [facility] Dehydration, Hyperglycemia and Ketoacidosis (stopped insulin) H/o Hep B, Mixed Connective                      Tissue D/O Pancytopenia 6/24-11/23/2007 Abd U/S multip Gallstones contracted GB CBD ULN  Tr Hydronephr R 11/21/2007 Abd U/S Galstones, Acute cholecystitis 05/09/2008 HOSP [facility] Acute Choleycystitis s/p Lap Choleycystectomy CTD DM w/ hypoglycemic sz in ER 12/18-12/27/09 Abd U/S Gallstoneas pericholeycystic fluid [facility] 05/11/08  Family History: Reviewed history from 09/08/2006 and no changes required. 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 Physical Exam General appearance: well developed, well nourished, no acute distress Head: normocephalic, atraumatic Eyes: conjunctivae and lids normal Pupils: equal, round, reactive to light Ears: normal, no lesions or deformities Nasal: mildly edematous nasal turbinates bilaterally Oral/Pharynx: dry mucous membranes, tongue normal, posterior pharynx without erythema or exudate Neck: neck supple,  trachea midline, no masses Chest/Lungs: no rales, wheezes, or rhonchi bilateral, breath sounds equal without effort Heart: regular rate and  rhythm, no murmur Abdomen: soft, non-tender without obvious organomegaly Extremities: normal extremities Neurological: grossly intact and non-focal Skin: small life tick noted on the left lower extremity under the knee, atopic dermatitis and severe dry skin noted, no herpetic lesions or signs of shingles seen. MSE: oriented to time, place, and  person Assessment Problems:   OTHER SCREENING MAMMOGRAM (ICD-V76.12) RASH AND OTHER NONSPECIFIC SKIN ERUPTION (ICD-782.1) HEMORRHOIDS, WITH BLEEDING (ICD-455.8) HAND PAIN, BILATERAL (ICD-729.5) LEG PAIN, LEFT (ICD-729.5) ABDOMINAL PAIN, RIGHT UPPER QUADRANT (ICD-789.01) PANCYTOPENIA (ICD-284.1) MIXED CONNECTIVE TISSUE DISEASE (ICD-710.9) HEPATITIS B, HX OF (ICD-V12.09) EXAMINATION, MEDICAL NEC, ADMN PURPOSES (ICD-V70.3) LEUKOPENIA, MILD (ICD-288.50) DERMATITIS, CONTACT, NOS (ICD-692.9) HYPERLIPIDEMIA (ICD-272.4) ANEMIA, PERNICIOUS (B12 69) (ICD-281.0) DISORDER, MENOPAUSAL NOS (ICD-627.9) DIABETES MELLITUS, TYPE II (ICD-250.00) ANXIETY (ICD-300.00) DEAFNESS (READS LIPS) (ICD-389.9) HYPOTHYROIDISM (ICD-244.9)  Assessed DEAFNESS (READS LIPS) as unchanged - Clanford Johnson MD New Problems: BITE OF NONVENOMOUS ARTHROPOD (ICD-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  The patient verbalized clear understanding.  The patient was given instructions to return if symptoms don't improve, worsen or new changes develop.  If it is not during clinic hours and the patient cannot get back to this clinic then the patient was told to seek medical care at an available urgent care or emergency department.  The patient verbalized understanding.   Demonstrates  willingness to comply.  Plan Planning Comments:   Start using Eucerin Lotion daily as we had discussed. See your PCP in 1 week for recheck.  Return or go to the ER if no improvement or symptoms getting worse.   Follow Up: Follow up in 2-3 days if no improvement, Follow up with Primary Physician  The patient and/or caregiver has been counseled thoroughly with regard to medications prescribed including dosage, schedule, interactions, rationale for use, and possible side effects and they verbalize understanding.  Diagnoses and  expected course of recovery discussed and will return if not improved as expected or if the condition worsens. Patient and/or caregiver verbalized understanding.   PROCEDURE: Follow up: With the patient's permission, the tick was removed from the left leg without difficulty by using a small forceps and applying gentle pressure at the head of the tick it released from the skin and was destroyed. This the patient tolerated the procedure very well. Antibiotic ointment was applied to the leg. The patient was given instructions that if she developed fever chills rash or any other symptoms to return.  Patient Instructions: 1)  See Your primary care doctor in one week for a recheck. 2)  Start Eucerin cream lotion daily to your skin is to retain moisture and improve the eczema. 3)  Monitor your blood glucose closely. 4)  Return if symptoms worsen or don't improve or new changes develop or go to the ER. 5)  The patient was informed that there is no on-call provider or services available at this clinic during off-hours (when the clinic is closed).  If the patient developed a problem or concern that required immediate attention, the patient was advised to go the the nearest available urgent care or emergency department for medical care.  The patient verbalized understanding.

## 2010-07-03 ENCOUNTER — Telehealth: Payer: Self-pay | Admitting: Family Medicine

## 2010-07-04 ENCOUNTER — Telehealth: Payer: Self-pay | Admitting: Family Medicine

## 2010-07-04 ENCOUNTER — Emergency Department: Payer: Self-pay | Admitting: Emergency Medicine

## 2010-07-08 ENCOUNTER — Telehealth: Payer: Self-pay | Admitting: Family Medicine

## 2010-07-10 NOTE — Progress Notes (Signed)
Summary: regarding diabetic supplies  Phone Note Call from Patient   Caller: [facility]  [phone], Victorino Dike Complaint: Headache Summary of Call: Pharmacist states that she is having difficulty getting medicare reimbursements on pt's diabetic supplies because of pt's diagnosis of 250.00.  Pt is on lantus, so pharmacist says the diagnosis should be different.  She is asking if we can fax her a back dated script for 02/22/10 for strips and lancets, to test 4 times a day and include the diagnosis.  Fax number is [phone]- she needs to have a hard copy on file. Initial call taken by: Lowella Petties CMA, AAMA,  June 27, 2010 12:13 PM  Follow-up for Phone Call        please let them know that I will defer to Dr. Hetty Ely on this.  please  forward the note to him.  Follow-up by: Crawford Givens MD,  June 27, 2010 1:20 PM  Additional Follow-up for Phone Call Additional follow up Details #1::        Spoke with Victorino Dike at pharmacy.  She says she found a hard copy of a script written last may so she doesnt need another script, but she does need office visit notes and labs from 6/01 to present.    Please fax to number in first note. Additional Follow-up by: Lowella Petties CMA, AAMA,  July 01, 2010 11:43 AM    Additional Follow-up for Phone Call Additional follow up Details #2::    Please fax copies of notes and labs as requested. Follow-up by: Shaune Leeks MD,  July 02, 2010 7:27 AM  Additional Follow-up for Phone Call Additional follow up Details #3:: Details for Additional Follow-up Action Taken: Office notes faxed to [facility] as instructed. No labs on file from 10/23/09 until present. Sydell Axon LPN  July 03, 2010 11:34 AM

## 2010-07-10 NOTE — Progress Notes (Signed)
Summary: Mammogram   ---- Converted from flag ---- ---- 07/03/2010 5:02 PM, Sydell Axon LPN wrote: Sherron Monday to Dr. Hetty Ely regarding patient cancelling and rescheduling her mammogram appointments since  her initial appointment was scheduled.. Was informed by Dr. Hetty Ely that patient is responsible now for going for this mammogram since she has rescheduled it so many times and he feels that our office has spent enough time following up on this test.   ---- 07/03/2010 2:46 PM, Sydell Axon LPN wrote:  Sherron Monday to [place] at [phone] and was informed that pataient cancelled her appointment on 07/01/10 and has rescheduled it for 07/24/10. [place]  ---- 07/03/2010 11:37 AM, Sydell Axon LPN wrote:   ---- 06/12/2010 8:32 AM, Sydell Axon LPN wrote: Debbe Bales at [phone] and was informed that patient has rescheduled again for 07/01/10. Schedule of events. Patient was scheduled for 03/26/10, cancelled and R/S for 04/28/10, cancelled and R/S again for 05/22/10, cancelled and R/S for 06/05/10, cancelled and R/S for 07/01/10. [place]  ---- 06/02/2010 10:46 AM, Sydell Axon LPN wrote: Patient rescheduled her appt for 06/05/10.  ---- 05/08/2010 12:02 PM, Carlton Adam wrote: Mammogram at [facility] clinic on 05/22/2010 at 11:00.  ---- 05/08/2010 11:50 AM, Shaune Leeks MD wrote: The following orders have been entered for this patient and placed on Admin Hold:  Type:     Referral       Code:   Radiology Description:   Radiology Referral Order Date:   05/08/2010   Authorized By:   Shaune Leeks MD Order #:   [id number] Clinical Notes:   Name of Test or Procedure:Pls reschedule mammo   Of What:  Special Instructions ------------------------------

## 2010-07-14 ENCOUNTER — Other Ambulatory Visit: Payer: Self-pay

## 2010-07-16 NOTE — Progress Notes (Signed)
Summary: refill request for alprazolam  Phone Note Refill Request Message from:  Fax from Pharmacy  Refills Requested: Medication #1:  ALPRAZOLAM 0.5 MG TABS 1/2 to 1 daily three times a day only if needed   Last Refilled: 05/28/2010 Faxed request from [facility].    [phone].  Initial call taken by: Lowella Petties CMA, AAMA,  July 08, 2010 9:32 AM  Follow-up for Phone Call        please call in.  Follow-up by: Crawford Givens MD,  July 08, 2010 10:41 AM  Additional Follow-up for Phone Call Additional follow up Details #1::        Medication phoned to pharmacy.  Additional Follow-up by: Delilah Shan CMA Duncan Dull),  July 08, 2010 10:54 AM    Prescriptions: ALPRAZOLAM 0.5 MG TABS (ALPRAZOLAM) 1/2 to 1 daily three times a day only if needed  #30 x 1   Entered and Authorized by:   Crawford Givens MD   Signed by:   Crawford Givens MD on 07/08/2010   Method used:   Telephoned to ...       [facility] (retail)       [address]       [place], [place]  [address]       Ph: [phone]       Fax: [phone]   RxID:   [id number]

## 2010-07-16 NOTE — Progress Notes (Addendum)
Summary: injured foot  Phone Note Call from Patient   Caller: Daughter Summary of Call: Pt's daughter called this morning stating pt had fallen yesterday and injured her foot, thought it might be broken.  She asked if she should come here, go to specialist or urgent care.  Offered appt here this afternoon with Dr. Reece Agar or suggested that pt might be better off going to urgent care or ER in case foot is broken.  Daughter stated she would take pt to urgent care.  Daughter called back a few minutes age saying urgent care wouldnt see pt because she has [place] access, which the daughter didnt tell me she had.  I told daughter, after checking with front office staff and Jamesetta So,  that since you are her doctor here, and you arent here, that we couldnt see her here either, that pt should go to ER.   Daughter said she was not going to sit in ER for 5 hours and said she would just take pt home. Initial call taken by: Lowella Petties CMA, AAMA,  July 04, 2010 3:35 PM  Follow-up for Phone Call        Pt called me at home. I told her best course was for the pt to be taken to the ER to be seen...will need Xray at the very least. She said that is what she would do. Follow-up by: Shaune Leeks MD,  July 07, 2010 5:40 PM

## 2010-07-17 ENCOUNTER — Encounter: Payer: Self-pay | Admitting: Family Medicine

## 2010-08-12 ENCOUNTER — Other Ambulatory Visit: Payer: Self-pay | Admitting: Family Medicine

## 2010-08-12 MED ORDER — ALPRAZOLAM 0.5 MG PO TABS: ORAL_TABLET | ORAL | Status: DC

## 2010-08-12 NOTE — Telephone Encounter (Signed)
Pt just had 30/1 refilled by Dr Para March 2/12 or so. Will refill for 30 pills to see how soon she needs refill.

## 2010-08-12 NOTE — Telephone Encounter (Signed)
Rx called to pharmacy

## 2010-09-01 ENCOUNTER — Other Ambulatory Visit: Payer: Self-pay | Admitting: Family Medicine

## 2010-09-02 ENCOUNTER — Encounter: Payer: Self-pay | Admitting: Family Medicine

## 2010-09-04 ENCOUNTER — Ambulatory Visit (INDEPENDENT_AMBULATORY_CARE_PROVIDER_SITE_OTHER): Payer: Medicare Other | Admitting: Family Medicine

## 2010-09-04 ENCOUNTER — Encounter: Payer: Self-pay | Admitting: Family Medicine

## 2010-09-04 VITALS — BP 112/68 | HR 68 | Temp 97.4°F | Ht 61.0 in | Wt 130.2 lb

## 2010-09-04 DIAGNOSIS — E538 Deficiency of other specified B group vitamins: Secondary | ICD-10-CM

## 2010-09-04 DIAGNOSIS — E119 Type 2 diabetes mellitus without complications: Secondary | ICD-10-CM

## 2010-09-04 LAB — BASIC METABOLIC PANEL
BUN: 10 mg/dL (ref 6–23)
Calcium: 9.3 mg/dL (ref 8.4–10.5)
Creatinine, Ser: 0.8 mg/dL (ref 0.4–1.2)
GFR: 77.98 mL/min (ref >60.00)
Glucose, Bld: 263 mg/dL — ABNORMAL HIGH (ref 70–99)

## 2010-09-04 MED ORDER — GLUCOSE BLOOD VI STRP: ORAL_STRIP | Status: DC

## 2010-09-04 MED ORDER — CYANOCOBALAMIN 1000 MCG/ML IJ SOLN
1000.0000 ug | Freq: Once | INTRAMUSCULAR | Status: AC
  Administered 2010-09-04: 1000 ug via INTRAMUSCULAR

## 2010-09-04 NOTE — Progress Notes (Signed)
  Subjective:    Patient ID: Gina Andersen, female    DOB: Nov 15, 1934, 76 y.o.   MRN: [id number]  HPI Pt here with her wife for recheck. She needs 4 times a day for strips for checking sugar. Her sugar went up to mid to upper 300s and she couldn't do anything. She had SOB from anxiety and hyperventilating from hyperglycemia. Her son has also worried her because of using Crack. She is now back on the sliding scale and her sugars are back in order. She is back to feeling ok.     Review of Systems  Constitutional: Negative for fever, chills, diaphoresis, activity change and fatigue.  HENT: Negative for ear pain, congestion, rhinorrhea and postnasal drip.   Eyes: Negative for redness.  Respiratory: Negative for cough, chest tightness, shortness of breath and wheezing.   Cardiovascular: Negative for chest pain.       Objective:   Physical Exam  Constitutional: She appears well-developed and well-nourished. No distress.  HENT:  Head: Normocephalic and atraumatic.  Right Ear: External ear normal.  Left Ear: External ear normal.  Nose: Nose normal.  Mouth/Throat: Oropharynx is clear and moist. No oropharyngeal exudate.  Eyes: Conjunctivae and EOM are normal. Pupils are equal, round, and reactive to light.  Neck: Normal range of motion. Neck supple. No thyromegaly present.  Cardiovascular: Normal rate, regular rhythm and normal heart sounds.   Pulmonary/Chest: Effort normal and breath sounds normal. She has no wheezes. She has no rales.  Lymphadenopathy:    She has no cervical adenopathy.  Skin: Skin is warm and dry. Rash (various outbreakas in various areas nonspecific in nature.) noted. She is not diaphoretic.          Assessment & Plan:

## 2010-09-04 NOTE — Patient Instructions (Signed)
Please schedule for Comp Exam, labs prior. Lab today.

## 2010-09-04 NOTE — Assessment & Plan Note (Addendum)
Pt has better sugar now. She is back on sliding scale insulin. She is indeed able to do this.  Will check labs today.

## 2010-09-08 ENCOUNTER — Other Ambulatory Visit: Payer: Self-pay | Admitting: Family Medicine

## 2010-09-08 DIAGNOSIS — E785 Hyperlipidemia, unspecified: Secondary | ICD-10-CM

## 2010-09-08 DIAGNOSIS — E039 Hypothyroidism, unspecified: Secondary | ICD-10-CM

## 2010-09-08 DIAGNOSIS — E119 Type 2 diabetes mellitus without complications: Secondary | ICD-10-CM

## 2010-09-24 ENCOUNTER — Ambulatory Visit: Payer: Medicare Other | Admitting: Family Medicine

## 2010-09-24 ENCOUNTER — Other Ambulatory Visit (INDEPENDENT_AMBULATORY_CARE_PROVIDER_SITE_OTHER): Payer: Medicare Other | Admitting: Family Medicine

## 2010-09-24 DIAGNOSIS — E538 Deficiency of other specified B group vitamins: Secondary | ICD-10-CM

## 2010-09-24 DIAGNOSIS — E785 Hyperlipidemia, unspecified: Secondary | ICD-10-CM

## 2010-09-24 DIAGNOSIS — E039 Hypothyroidism, unspecified: Secondary | ICD-10-CM

## 2010-09-24 DIAGNOSIS — D61818 Other pancytopenia: Secondary | ICD-10-CM

## 2010-09-24 DIAGNOSIS — E119 Type 2 diabetes mellitus without complications: Secondary | ICD-10-CM

## 2010-09-24 LAB — RENAL FUNCTION PANEL
BUN: 12 mg/dL (ref 6–23)
Chloride: 103 mEq/L (ref 96–112)
GFR: 96.52 mL/min (ref >60.00)
Phosphorus: 3.2 mg/dL (ref 2.3–4.6)
Sodium: 137 mEq/L (ref 135–145)

## 2010-09-24 LAB — CBC WITH DIFFERENTIAL/PLATELET
Basophils Relative: 0.3 % (ref 0.0–3.0)
Eosinophils Absolute: 0.2 10*3/uL (ref 0.0–0.7)
Hemoglobin: 12 g/dL (ref 12.0–15.0)
MCHC: 34.3 g/dL (ref 30.0–36.0)
MCV: 92 fl (ref 78.0–100.0)
Monocytes Absolute: 0.3 10*3/uL (ref 0.1–1.0)
Neutro Abs: 1.6 10*3/uL (ref 1.4–7.7)
Neutrophils Relative %: 45.5 % (ref 43.0–77.0)
RBC: 3.8 Mil/uL — ABNORMAL LOW (ref 3.87–5.11)

## 2010-09-24 LAB — TSH: TSH: 0.03 u[IU]/mL — ABNORMAL LOW (ref 0.35–5.50)

## 2010-09-24 LAB — LIPID PANEL: Total CHOL/HDL Ratio: 3

## 2010-09-24 LAB — HEPATIC FUNCTION PANEL
AST: 29 U/L (ref 0–37)
Albumin: 3 g/dL — ABNORMAL LOW (ref 3.5–5.2)

## 2010-09-24 LAB — VITAMIN B12: Vitamin B-12: 295 pg/mL (ref 211–911)

## 2010-09-24 LAB — T4, FREE: Free T4: 1.45 ng/dL (ref 0.60–1.60)

## 2010-09-24 LAB — MICROALBUMIN / CREATININE URINE RATIO: Creatinine,U: 37.3 mg/dL

## 2010-09-24 MED ORDER — CYANOCOBALAMIN 1000 MCG/ML IJ SOLN
1000.0000 ug | Freq: Once | INTRAMUSCULAR | Status: AC
  Administered 2010-09-24: 1000 ug via INTRAMUSCULAR

## 2010-09-24 NOTE — Progress Notes (Signed)
Addended by: Delilah Shan on: 09/24/2010 09:50 AM   Modules accepted: Orders

## 2010-10-01 ENCOUNTER — Ambulatory Visit (INDEPENDENT_AMBULATORY_CARE_PROVIDER_SITE_OTHER): Payer: Medicare Other | Admitting: Family Medicine

## 2010-10-01 ENCOUNTER — Encounter: Payer: Self-pay | Admitting: Family Medicine

## 2010-10-01 DIAGNOSIS — M79609 Pain in unspecified limb: Secondary | ICD-10-CM

## 2010-10-01 DIAGNOSIS — E119 Type 2 diabetes mellitus without complications: Secondary | ICD-10-CM

## 2010-10-01 DIAGNOSIS — K649 Unspecified hemorrhoids: Secondary | ICD-10-CM

## 2010-10-01 DIAGNOSIS — N959 Unspecified menopausal and perimenopausal disorder: Secondary | ICD-10-CM

## 2010-10-01 DIAGNOSIS — R1011 Right upper quadrant pain: Secondary | ICD-10-CM

## 2010-10-01 DIAGNOSIS — E785 Hyperlipidemia, unspecified: Secondary | ICD-10-CM

## 2010-10-01 DIAGNOSIS — E039 Hypothyroidism, unspecified: Secondary | ICD-10-CM

## 2010-10-01 DIAGNOSIS — F411 Generalized anxiety disorder: Secondary | ICD-10-CM

## 2010-10-01 DIAGNOSIS — D72819 Decreased white blood cell count, unspecified: Secondary | ICD-10-CM

## 2010-10-01 DIAGNOSIS — R21 Rash and other nonspecific skin eruption: Secondary | ICD-10-CM

## 2010-10-01 DIAGNOSIS — L259 Unspecified contact dermatitis, unspecified cause: Secondary | ICD-10-CM

## 2010-10-01 NOTE — Assessment & Plan Note (Signed)
Continues but see this as part of her personality with no reason for treating and adding med.

## 2010-10-01 NOTE — Assessment & Plan Note (Signed)
Overcontrolled. Will decrease Dose and recheck in three months.

## 2010-10-01 NOTE — Progress Notes (Signed)
Subjective:    Patient ID: Gina Andersen, female    DOB: 1935/04/18, 76 y.o.   MRN: [id number]  HPI Pt here with her son for Comp Exam. She sees no one else for her diabetes. She had been seeing Dr Patrecia Pace but quit going because "he had her on too much insulin."  Her daughter has used some of her anxiety medications. She has no complaints except her legs bother her with spots.  She has had hysterectomy for bleeding, ovaries intact.     Review of Systems  Constitutional: Positive for chills. Negative for fever, diaphoresis, activity change, appetite change, fatigue and unexpected weight change.       ROS review through her son.   HENT: Negative for hearing loss, ear pain, nosebleeds, rhinorrhea, trouble swallowing, tinnitus and ear discharge.   Eyes: Negative for pain, discharge, redness and visual disturbance.       To see Dr Marti Sleigh within  the month, optometrist.  Respiratory: Negative for cough, chest tightness, shortness of breath and wheezing.   Cardiovascular: Positive for chest pain (complaining lately with anxiety.). Negative for palpitations and leg swelling.  Gastrointestinal: Negative for nausea, vomiting, abdominal pain, diarrhea, constipation, blood in stool and abdominal distention.  Genitourinary: Negative for dysuria and frequency.  Musculoskeletal: Negative for myalgias, back pain and arthralgias.  Skin: Negative for rash.  Neurological: Negative for dizziness, tremors, syncope and numbness.  Hematological: Negative for adenopathy. Does not bruise/bleed easily.  Psychiatric/Behavioral: Negative for hallucinations and agitation. The patient is not nervous/anxious.        Objective:   Physical Exam  Constitutional: She is oriented to person, place, and time. She appears well-developed and well-nourished. No distress.  HENT:  Head: Normocephalic and atraumatic.  Right Ear: External ear normal.  Left Ear: External ear normal.  Nose: Nose normal.  Mouth/Throat:  Oropharynx is clear and moist.  Eyes: Conjunctivae and EOM are normal. Pupils are equal, round, and reactive to light. No scleral icterus.       Mild allergic shiners bilat.  Neck: Normal range of motion. Neck supple. No thyromegaly present.  Cardiovascular: Normal rate, regular rhythm, normal heart sounds and intact distal pulses.   No murmur heard. Pulmonary/Chest: Effort normal and breath sounds normal. No respiratory distress. She has no wheezes.  Abdominal: Soft. Bowel sounds are normal. She exhibits no mass. There is no tenderness.  Genitourinary: Guaiac negative stool.       Bimanual only done Introitus wnl, Uterus and Cervix absent, Adnexa nontender w/o mass, ovaries not felt.    Musculoskeletal: Normal range of motion.       Back Nontender in the Midline  Lymphadenopathy:    She has no cervical adenopathy.  Neurological: She is alert and oriented to person, place, and time. She exhibits normal muscle tone. Coordination normal.  Skin: Skin is warm and dry. No rash noted. She is not diaphoretic.  Psychiatric: She has a normal mood and affect. Her behavior is normal. Judgment and thought content normal.       Pt is very anxious as her nml affect. She allowed an entire PE today which is very good for her.           Assessment & Plan:  Health Maintenance Exam  I have personally reviewed the Medicare Annual Wellness questionnaire and have noted 1. The patient's medical and social history 2. Their use of alcohol, tobacco or illicit drugs 3. Their current medications and supplements 4. The patient's functional ability including ADL's, fall  risks, home safety risks and hearing or visual             Impairment. To have Optom exam within the month. Hearing grossly abnml, essentially deaf. 5. Diet and physical activities 6. Evidence for depression or mood disorders   Pt declines mammo. Had been scheduled twice last year and no showed. Was hurt one time with squeezing the breasts  and will not return.

## 2010-10-01 NOTE — Assessment & Plan Note (Signed)
Rectal exam clear today. Declines colonoscopy.

## 2010-10-01 NOTE — Assessment & Plan Note (Signed)
Apparently resolved 

## 2010-10-01 NOTE — Assessment & Plan Note (Signed)
Resolved

## 2010-10-01 NOTE — Assessment & Plan Note (Signed)
Pt very noncompliant with diet and questionably compliant with medication. She checks her sugar 4 times a day but doesn't change dosing as requested with scales. She is very hesitant to take more Lantus. Will try to mildly increase all doses and recheck in 3 mos. Lab Results  Component Value Date   HGBA1C 10.1* 09/24/2010  \

## 2010-10-01 NOTE — Assessment & Plan Note (Signed)
Pt continues to complain about this but see no overt evidence this is a significant problem. Will continue to follow.

## 2010-10-01 NOTE — Assessment & Plan Note (Signed)
Reasonably adequate nos. Lab Results  Component Value Date   CHOL 139 09/24/2010   CHOL 131 01/09/2009   Lab Results  Component Value Date   HDL 50.40 09/24/2010   HDL 50.10 01/09/2009   Lab Results  Component Value Date   LDLCALC 58 09/24/2010   LDLCALC 58 01/09/2009   Lab Results  Component Value Date   TRIG 155.0* 09/24/2010   TRIG 117.0 01/09/2009   Lab Results  Component Value Date   CHOLHDL 3 09/24/2010   CHOLHDL 3 01/09/2009   No results found for this basename: LDLDIRECT

## 2010-10-01 NOTE — Assessment & Plan Note (Signed)
Declines Mammo. Declines Pap but bimanual feels nml.

## 2010-10-01 NOTE — Patient Instructions (Addendum)
RTC 3 mos, A1C 250.00 and Tsh prior 244.9 Test sugar max of three times a day. Must decrease sugar intake. No sweetsw. Increase insulin, Lantus to 15 units  Make scale 150-200 4units, 201-250 8 units 251-350 12 units >351 14 units Decrease Levothyr to daily. Take Allergy medicine (Claritin 10mg ) daily.

## 2010-10-01 NOTE — Assessment & Plan Note (Signed)
Continues across all cell lines. See no acceleration. Will cont to follow. Lab Results  Component Value Date   WBC 3.5* 09/24/2010   HGB 12.0 09/24/2010   HCT 35.0* 09/24/2010   MCV 92.0 09/24/2010   PLT 92.0* 09/24/2010

## 2010-10-02 ENCOUNTER — Other Ambulatory Visit: Payer: Self-pay | Admitting: *Deleted

## 2010-10-02 ENCOUNTER — Telehealth: Payer: Self-pay | Admitting: *Deleted

## 2010-10-02 MED ORDER — "INSULIN SYRINGE-NEEDLE U-100 31G X 5/16"" 0.5 ML MISC": Status: DC

## 2010-10-02 MED ORDER — LEVOTHYROXINE SODIUM 88 MCG PO TABS: 88.0000 ug | ORAL_TABLET | Freq: Every day | ORAL | Status: DC

## 2010-10-02 NOTE — Telephone Encounter (Signed)
Received a faxed form from pharmacy stating that the patient advised them that you decreased her thyroid medication to 88 mcg and if this is correct they need a new script.

## 2010-10-02 NOTE — Telephone Encounter (Signed)
Received faxed refill request from pharmacy.

## 2010-10-08 ENCOUNTER — Other Ambulatory Visit: Payer: Self-pay | Admitting: *Deleted

## 2010-10-08 MED ORDER — FREESTYLE LANCETS MISC: Status: DC

## 2010-10-08 MED ORDER — GLUCOSE BLOOD VI STRP: ORAL_STRIP | Status: DC

## 2010-10-09 ENCOUNTER — Other Ambulatory Visit: Payer: Self-pay | Admitting: *Deleted

## 2010-10-09 MED ORDER — ALPRAZOLAM 0.5 MG PO TABS: ORAL_TABLET | ORAL | Status: DC

## 2010-10-09 NOTE — Telephone Encounter (Signed)
Rx called to pharmacy as instructed. 

## 2010-10-10 ENCOUNTER — Telehealth: Payer: Self-pay | Admitting: *Deleted

## 2010-10-10 DIAGNOSIS — Z0279 Encounter for issue of other medical certificate: Secondary | ICD-10-CM

## 2010-10-10 NOTE — Telephone Encounter (Signed)
Opened in error

## 2010-10-13 ENCOUNTER — Telehealth: Payer: Self-pay | Admitting: *Deleted

## 2010-10-13 NOTE — Telephone Encounter (Signed)
Pt's son has dropped off a form that pt needs completed for DMV.  He says you have completed these forms for her before.  Form is on your desk.

## 2010-10-16 ENCOUNTER — Other Ambulatory Visit: Payer: Self-pay | Admitting: *Deleted

## 2010-10-16 MED ORDER — GLUCOSE BLOOD VI STRP: ORAL_STRIP | Status: DC

## 2010-10-16 NOTE — Telephone Encounter (Signed)
I filled out form as much as I can. She needs to see Opthalm and Audiology as those are the reasons the form was sent.

## 2010-10-16 NOTE — Telephone Encounter (Signed)
Patient's son notified as instructed by telephone. Was informed that he will pick the form up next Friday.

## 2010-10-22 ENCOUNTER — Other Ambulatory Visit: Payer: Self-pay | Admitting: *Deleted

## 2010-10-22 MED ORDER — INSULIN ASPART 100 UNIT/ML ~~LOC~~ SOLN: SUBCUTANEOUS | Status: DC

## 2010-10-23 MED ORDER — INSULIN GLARGINE 100 UNIT/ML ~~LOC~~ SOLN: 15.0000 [IU] | Freq: Every day | SUBCUTANEOUS | Status: DC

## 2010-10-23 MED ORDER — INSULIN ASPART 100 UNIT/ML ~~LOC~~ SOLN: SUBCUTANEOUS | Status: DC

## 2010-10-23 NOTE — Telephone Encounter (Signed)
Addended by: Arta Silence on: 10/23/2010 07:01 AM   Modules accepted: Orders

## 2010-11-03 ENCOUNTER — Other Ambulatory Visit: Payer: Self-pay | Admitting: *Deleted

## 2010-11-03 MED ORDER — ALPRAZOLAM 0.5 MG PO TABS: ORAL_TABLET | ORAL | Status: DC

## 2010-11-03 NOTE — Telephone Encounter (Signed)
Rx called to pharmacy as instructed. 

## 2010-11-03 NOTE — Telephone Encounter (Signed)
Received faxed refill request from pharmacy. Is it okay to fill this medication?

## 2010-11-03 NOTE — Telephone Encounter (Signed)
OK to refill, has been almost 3 months since she got 30, I think per chart.

## 2010-11-04 ENCOUNTER — Ambulatory Visit (INDEPENDENT_AMBULATORY_CARE_PROVIDER_SITE_OTHER): Payer: Medicare Other | Admitting: Family Medicine

## 2010-11-04 DIAGNOSIS — E538 Deficiency of other specified B group vitamins: Secondary | ICD-10-CM

## 2010-11-04 MED ORDER — CYANOCOBALAMIN 1000 MCG/ML IJ SOLN
1000.0000 ug | Freq: Once | INTRAMUSCULAR | Status: AC
  Administered 2010-11-04: 1000 ug via INTRAMUSCULAR

## 2010-11-04 NOTE — Progress Notes (Signed)
Patient given B12 injection today at nurse visit.  Patient's daughter stated that this would be the last B12 injection her mom will get, she will start giving her oral vitamin b12 1,000mg  daily.  She stated that when Gina Andersen takes the injection it doesn't seem to stay in her system long.  When she urinates her urine is red like the b12 and she thinks it is coming out of her body in her urine.

## 2010-12-10 ENCOUNTER — Other Ambulatory Visit: Payer: Self-pay | Admitting: *Deleted

## 2010-12-10 MED ORDER — ALPRAZOLAM 0.5 MG PO TABS: ORAL_TABLET | ORAL | Status: DC

## 2010-12-10 NOTE — Telephone Encounter (Signed)
Rx called to pharmacy as instructed. 

## 2010-12-10 NOTE — Telephone Encounter (Signed)
Received faxed refill request from pharmacy. Is it okay to refill this? Please verify dispense amount and number of refills.

## 2011-01-08 ENCOUNTER — Encounter: Payer: Self-pay | Admitting: Family Medicine

## 2011-01-08 ENCOUNTER — Ambulatory Visit (INDEPENDENT_AMBULATORY_CARE_PROVIDER_SITE_OTHER): Payer: Medicare Other | Admitting: Family Medicine

## 2011-01-08 VITALS — BP 118/70 | HR 80 | Temp 98.6°F | Wt 128.8 lb

## 2011-01-08 DIAGNOSIS — F411 Generalized anxiety disorder: Secondary | ICD-10-CM

## 2011-01-08 MED ORDER — SERTRALINE HCL 50 MG PO TABS: ORAL_TABLET | ORAL | Status: DC

## 2011-01-08 MED ORDER — ALPRAZOLAM 0.5 MG PO TABS: ORAL_TABLET | ORAL | Status: DC

## 2011-01-08 NOTE — Assessment & Plan Note (Signed)
Pt upset by multiple things, mostly involved with where she lives. Will try her on Sertraline regularly, 1/2 of 50mg  for one week and then whole 50mg  daily. Will see back in 3 mos.

## 2011-01-08 NOTE — Progress Notes (Signed)
  Subjective:    Patient ID: Gina Andersen, female    DOB: 08-Jan-1935, 76 y.o.   MRN: [id number]  HPI Pt here as acute appt not feeling good for three days. She has been upset with other people. Her sugar was 150 today. Her best friend , [place], where she lives has cancer of the colon and is not doing well. Another woman she knows has TB. She moved out of her appt. She has had trouble with her toe and her son is sick so could not go with her to see Dr Al Corpus. She got a shot in her foot. She has been real upset and has taken Xanax 1/2 tab at night for a long time, none at any other time by her admission.    Review of Systems  Constitutional: Negative for fever, chills, diaphoresis, activity change and fatigue.  HENT: Negative for ear pain, congestion, rhinorrhea and postnasal drip.   Eyes: Negative for redness.  Respiratory: Negative for cough, chest tightness, shortness of breath and wheezing.   Cardiovascular: Negative for chest pain.       Objective:   Physical Exam  Constitutional: She appears well-developed and well-nourished. No distress.  HENT:  Head: Normocephalic and atraumatic.  Right Ear: External ear normal.  Left Ear: External ear normal.  Nose: Nose normal.  Mouth/Throat: Oropharynx is clear and moist. No oropharyngeal exudate.  Eyes: Conjunctivae and EOM are normal. Pupils are equal, round, and reactive to light.  Neck: Normal range of motion. Neck supple. No thyromegaly present.  Cardiovascular: Normal rate, regular rhythm and normal heart sounds.   Pulmonary/Chest: Effort normal and breath sounds normal. She has no wheezes. She has no rales.  Lymphadenopathy:    She has no cervical adenopathy.  Skin: Skin is warm and dry. She is not diaphoretic.       Gt toenails bilat with bandaid over nail.  Psychiatric: She has a normal mood and affect. Her behavior is normal. Judgment and thought content normal.       Anxious about many small things consistent with her usual  behavior but more anxious than usual.          Assessment & Plan:

## 2011-01-08 NOTE — Patient Instructions (Addendum)
RTC 3 mos for recheck. Start Sertraline 50mg , 1/2 tab a day for the first week and then take a whole pill. May continue to take Xanax 1/2 every night.

## 2011-01-30 ENCOUNTER — Telehealth: Payer: Self-pay | Admitting: *Deleted

## 2011-01-30 DIAGNOSIS — E119 Type 2 diabetes mellitus without complications: Secondary | ICD-10-CM

## 2011-01-30 DIAGNOSIS — E039 Hypothyroidism, unspecified: Secondary | ICD-10-CM

## 2011-01-30 MED ORDER — LEVOTHYROXINE SODIUM 88 MCG PO TABS: 88.0000 ug | ORAL_TABLET | Freq: Every day | ORAL | Status: DC

## 2011-01-30 NOTE — Telephone Encounter (Signed)
Levothroid is out of stock right now, what brand would you prefer Ms. Pleitez to be on?

## 2011-01-30 NOTE — Telephone Encounter (Signed)
I would use available generic levothyroxine .  No change in dose.  Please send in new rx to pharmacy, #30, 12 rf if needed.  Thanks.  She appears to be due for repeat TSH and A1c per Dr. Lorenza Chick prev note.  Please schedule lab visit before OV with Dr. Hetty Ely.  I put in the order for the TSH and A1c.

## 2011-01-30 NOTE — Telephone Encounter (Signed)
Patient has appt with Dr. Hetty Ely in November.

## 2011-02-02 ENCOUNTER — Other Ambulatory Visit: Payer: Self-pay | Admitting: *Deleted

## 2011-02-02 NOTE — Telephone Encounter (Signed)
Patient was taking 1/2-1 tablet three times daily, normally 1 daily. The new Rx that was prescribed at her last visit was too few pills. Can she have another Rx that reads 1/2-1 three times daily and increase the quantity?

## 2011-02-04 MED ORDER — ALPRAZOLAM 0.5 MG PO TABS: ORAL_TABLET | ORAL | Status: DC

## 2011-02-04 NOTE — Telephone Encounter (Signed)
I want her to have no more than thirty pills at a time. May refill for 5 times.

## 2011-02-04 NOTE — Telephone Encounter (Signed)
Pharmacy notified as instructed by telephone. 

## 2011-02-14 ENCOUNTER — Ambulatory Visit: Payer: Self-pay | Admitting: Internal Medicine

## 2011-02-26 ENCOUNTER — Other Ambulatory Visit: Payer: Self-pay

## 2011-02-26 ENCOUNTER — Ambulatory Visit (INDEPENDENT_AMBULATORY_CARE_PROVIDER_SITE_OTHER): Payer: Medicare Other | Admitting: *Deleted

## 2011-02-26 DIAGNOSIS — E538 Deficiency of other specified B group vitamins: Secondary | ICD-10-CM

## 2011-02-26 MED ORDER — CYANOCOBALAMIN 1000 MCG/ML IJ SOLN
1000.0000 ug | Freq: Once | INTRAMUSCULAR | Status: AC
  Administered 2011-02-26: 1000 ug via INTRAMUSCULAR

## 2011-02-26 MED ORDER — POTASSIUM CHLORIDE CRYS ER 20 MEQ PO TBCR: 20.0000 meq | EXTENDED_RELEASE_TABLET | Freq: Two times a day (BID) | ORAL | Status: DC

## 2011-02-26 NOTE — Telephone Encounter (Signed)
[facility] faxed refill request Klor-con 20 meq #60 x 6 refill.

## 2011-03-17 ENCOUNTER — Other Ambulatory Visit: Payer: Self-pay | Admitting: *Deleted

## 2011-03-17 NOTE — Telephone Encounter (Signed)
Pt's daughter states pt was given lorazepam at an urgent care and daughter says this has really helped with her leg and back pain.  She is asking for a refill of this and xanax.  She takes one lorazepam every morning for the pain and one xanax every night to help her sleep.  She has only been getting 15 xanax at a time, but she wants this increased to 30 for a months supply. Wants a months supply of lorazepam as well.  She has not been taking zoloft because this makes her crazy.  Uses [facility].

## 2011-03-18 ENCOUNTER — Other Ambulatory Visit: Payer: Self-pay | Admitting: Family Medicine

## 2011-03-18 DIAGNOSIS — E119 Type 2 diabetes mellitus without complications: Secondary | ICD-10-CM

## 2011-03-18 MED ORDER — LORAZEPAM 0.5 MG PO TABS: 0.5000 mg | ORAL_TABLET | Freq: Two times a day (BID) | ORAL | Status: AC

## 2011-03-18 NOTE — Telephone Encounter (Signed)
Patient's daughter Raynelle Fanning) notified as instructed by telephone. Lab appointment scheduled prior to next office visit. Rx called to pharmacy.

## 2011-03-18 NOTE — Telephone Encounter (Signed)
Lorazepam and Xanax are the same type of medication. Go to Lorazepam twice a day instead of Lorazepam in the AM and Xanax at night.

## 2011-03-30 ENCOUNTER — Other Ambulatory Visit: Payer: Self-pay | Admitting: *Deleted

## 2011-03-30 MED ORDER — ESOMEPRAZOLE MAGNESIUM 40 MG PO CPDR: 40.0000 mg | DELAYED_RELEASE_CAPSULE | Freq: Every day | ORAL | Status: DC

## 2011-04-01 ENCOUNTER — Telehealth: Payer: Self-pay | Admitting: *Deleted

## 2011-04-01 ENCOUNTER — Ambulatory Visit: Payer: Medicare Other | Admitting: Family Medicine
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
## 2011-04-01 NOTE — Telephone Encounter (Signed)
Noted  

## 2011-04-02 ENCOUNTER — Ambulatory Visit: Payer: Medicare Other | Admitting: Family Medicine

## 2011-04-07 ENCOUNTER — Other Ambulatory Visit: Payer: Medicare Other

## 2011-04-08 ENCOUNTER — Other Ambulatory Visit: Payer: Medicare Other

## 2011-04-15 ENCOUNTER — Ambulatory Visit: Payer: Medicare Other | Admitting: Family Medicine

## 2011-06-17 ENCOUNTER — Other Ambulatory Visit: Payer: Self-pay | Admitting: *Deleted

## 2011-06-17 MED ORDER — "INSULIN SYRINGE-NEEDLE U-100 31G X 5/16"" 0.5 ML MISC": Status: DC

## 2011-07-30 ENCOUNTER — Other Ambulatory Visit: Payer: Self-pay | Admitting: *Deleted

## 2011-07-30 MED ORDER — ESOMEPRAZOLE MAGNESIUM 40 MG PO CPDR: 40.0000 mg | DELAYED_RELEASE_CAPSULE | Freq: Every day | ORAL | Status: DC

## 2011-12-21 DIAGNOSIS — E119 Type 2 diabetes mellitus without complications: Secondary | ICD-10-CM | POA: Insufficient documentation

## 2011-12-21 DIAGNOSIS — G723 Periodic paralysis: Secondary | ICD-10-CM | POA: Insufficient documentation

## 2011-12-22 DIAGNOSIS — R945 Abnormal results of liver function studies: Secondary | ICD-10-CM | POA: Insufficient documentation

## 2012-02-29 ENCOUNTER — Emergency Department: Payer: Self-pay | Admitting: Emergency Medicine

## 2012-03-31 ENCOUNTER — Ambulatory Visit: Payer: Self-pay | Admitting: Internal Medicine

## 2012-06-24 ENCOUNTER — Inpatient Hospital Stay: Payer: Self-pay | Admitting: Internal Medicine

## 2012-06-24 LAB — MAGNESIUM: Magnesium: 1.5 mg/dL — ABNORMAL LOW

## 2012-06-24 LAB — URINALYSIS, COMPLETE
Bilirubin,UR: NEGATIVE
Glucose,UR: >=500 mg/dL (ref 0–75)
Nitrite: NEGATIVE
Ph: 6 (ref 4.5–8.0)
Protein: NEGATIVE
RBC,UR: 9 /HPF (ref 0–5)
Specific Gravity: 1.023 (ref 1.003–1.030)
Squamous Epithelial: <1

## 2012-06-24 LAB — COMPREHENSIVE METABOLIC PANEL
Albumin: 3 g/dL — ABNORMAL LOW (ref 3.4–5.0)
Alkaline Phosphatase: 107 U/L (ref 50–136)
BUN: 20 mg/dL — ABNORMAL HIGH (ref 7–18)
Bilirubin,Total: 0.8 mg/dL (ref 0.2–1.0)
Co2: 25 mmol/L (ref 21–32)
EGFR (African American): 57 — ABNORMAL LOW
EGFR (Non-African Amer.): 49 — ABNORMAL LOW
Osmolality: 293 (ref 275–301)
SGOT(AST): 26 U/L (ref 15–37)
Total Protein: 7.6 g/dL (ref 6.4–8.2)

## 2012-06-24 LAB — CBC WITH DIFFERENTIAL/PLATELET
Basophil #: 0 10*3/uL (ref 0.0–0.1)
HCT: 33 % — ABNORMAL LOW (ref 35.0–47.0)
HGB: 11.2 g/dL — ABNORMAL LOW (ref 12.0–16.0)
Lymphocyte #: 1.6 10*3/uL (ref 1.0–3.6)
Lymphocyte %: 31.7 %
MCV: 94 fL (ref 80–100)
Monocyte %: 4.9 %
Platelet: 105 10*3/uL — ABNORMAL LOW (ref 150–440)
RBC: 3.51 10*6/uL — ABNORMAL LOW (ref 3.80–5.20)
RDW: 14.5 % (ref 11.5–14.5)

## 2012-06-25 LAB — BASIC METABOLIC PANEL
Anion Gap: 7 (ref 7–16)
BUN: 14 mg/dL (ref 7–18)
Calcium, Total: 8.3 mg/dL — ABNORMAL LOW (ref 8.5–10.1)
Co2: 23 mmol/L (ref 21–32)
Creatinine: 0.86 mg/dL (ref 0.60–1.30)
EGFR (Non-African Amer.): >60
Glucose: 426 mg/dL — ABNORMAL HIGH (ref 65–99)
Potassium: 4.2 mmol/L (ref 3.5–5.1)
Sodium: 137 mmol/L (ref 136–145)

## 2012-06-25 LAB — CBC WITH DIFFERENTIAL/PLATELET
Basophil #: 0 10*3/uL (ref 0.0–0.1)
Eosinophil %: 6 %
HGB: 10.5 g/dL — ABNORMAL LOW (ref 12.0–16.0)
Lymphocyte #: 1.9 10*3/uL (ref 1.0–3.6)
Lymphocyte %: 40.2 %
MCHC: 33.4 g/dL (ref 32.0–36.0)
Monocyte #: 0.3 x10 3/mm (ref 0.2–0.9)
Monocyte %: 5.8 %
Neutrophil #: 2.2 10*3/uL (ref 1.4–6.5)
Platelet: 94 10*3/uL — ABNORMAL LOW (ref 150–440)
RBC: 3.4 10*6/uL — ABNORMAL LOW (ref 3.80–5.20)
WBC: 4.6 10*3/uL (ref 3.6–11.0)

## 2012-06-25 LAB — HEMOGLOBIN A1C: Hemoglobin A1C: 11.4 % — ABNORMAL HIGH (ref 4.2–6.3)

## 2012-06-25 LAB — MAGNESIUM: Magnesium: 1.7 mg/dL — ABNORMAL LOW

## 2012-06-27 LAB — URINE CULTURE

## 2012-06-29 LAB — PLATELET COUNT: Platelet: 90 10*3/uL — ABNORMAL LOW (ref 150–440)

## 2012-07-01 LAB — CULTURE, BLOOD (SINGLE)

## 2012-08-10 ENCOUNTER — Emergency Department: Payer: Self-pay

## 2012-08-10 LAB — DRUG SCREEN, URINE
Amphetamines, Ur Screen: NEGATIVE (ref ?–1000)
Barbiturates, Ur Screen: NEGATIVE (ref ?–200)
Benzodiazepine, Ur Scrn: NEGATIVE (ref ?–200)
Cocaine Metabolite,Ur ~~LOC~~: NEGATIVE (ref ?–300)
MDMA (Ecstasy)Ur Screen: NEGATIVE (ref ?–500)
Opiate, Ur Screen: NEGATIVE (ref ?–300)
Phencyclidine (PCP) Ur S: NEGATIVE (ref ?–25)
Tricyclic, Ur Screen: NEGATIVE (ref ?–1000)

## 2012-08-10 LAB — URINALYSIS, COMPLETE
Bilirubin,UR: NEGATIVE
Glucose,UR: >=500 mg/dL (ref 0–75)
Ph: 5 (ref 4.5–8.0)
Specific Gravity: 1.021 (ref 1.003–1.030)
WBC UR: 98 /HPF (ref 0–5)

## 2012-08-10 LAB — ETHANOL
Ethanol %: 0.005 % (ref 0.000–0.080)
Ethanol: 5 mg/dL

## 2012-08-10 LAB — COMPREHENSIVE METABOLIC PANEL
Alkaline Phosphatase: 160 U/L — ABNORMAL HIGH (ref 50–136)
Bilirubin,Total: 0.7 mg/dL (ref 0.2–1.0)
Calcium, Total: 9.2 mg/dL (ref 8.5–10.1)
Chloride: 106 mmol/L (ref 98–107)
Co2: 24 mmol/L (ref 21–32)
Creatinine: 1.27 mg/dL (ref 0.60–1.30)
EGFR (African American): 48 — ABNORMAL LOW
EGFR (Non-African Amer.): 41 — ABNORMAL LOW
Potassium: 4.6 mmol/L (ref 3.5–5.1)
SGPT (ALT): 29 U/L (ref 12–78)
Total Protein: 8.3 g/dL — ABNORMAL HIGH (ref 6.4–8.2)

## 2012-08-10 LAB — SALICYLATE LEVEL: Salicylates, Serum: <1.7 mg/dL

## 2012-08-10 LAB — ACETAMINOPHEN LEVEL: Acetaminophen: <2 ug/mL

## 2012-08-10 LAB — TSH: Thyroid Stimulating Horm: 18.3 u[IU]/mL — ABNORMAL HIGH

## 2012-08-10 LAB — CBC
MCH: 30.9 pg (ref 26.0–34.0)
MCHC: 33.1 g/dL (ref 32.0–36.0)
MCV: 93 fL (ref 80–100)
RBC: 4.17 10*6/uL (ref 3.80–5.20)
RDW: 13.9 % (ref 11.5–14.5)

## 2012-08-10 LAB — T4, FREE: Free Thyroxine: 1.11 ng/dL (ref 0.76–1.46)

## 2012-08-12 LAB — URINE CULTURE

## 2012-09-02 LAB — CBC
HCT: 33.5 % — ABNORMAL LOW (ref 35.0–47.0)
HGB: 11.6 g/dL — ABNORMAL LOW (ref 12.0–16.0)
MCH: 31.7 pg (ref 26.0–34.0)
MCHC: 34.7 g/dL (ref 32.0–36.0)
MCV: 91 fL (ref 80–100)
Platelet: 105 10*3/uL — ABNORMAL LOW (ref 150–440)
RBC: 3.67 10*6/uL — ABNORMAL LOW (ref 3.80–5.20)
WBC: 4.6 10*3/uL (ref 3.6–11.0)

## 2012-09-02 LAB — COMPREHENSIVE METABOLIC PANEL
Albumin: 3.3 g/dL — ABNORMAL LOW (ref 3.4–5.0)
Alkaline Phosphatase: 167 U/L — ABNORMAL HIGH (ref 50–136)
BUN: 18 mg/dL (ref 7–18)
Calcium, Total: 9 mg/dL (ref 8.5–10.1)
Creatinine: 0.97 mg/dL (ref 0.60–1.30)
EGFR (African American): >60
EGFR (Non-African Amer.): 57 — ABNORMAL LOW
Glucose: 399 mg/dL — ABNORMAL HIGH (ref 65–99)
Osmolality: 292 (ref 275–301)
SGPT (ALT): 37 U/L (ref 12–78)
Sodium: 137 mmol/L (ref 136–145)
Total Protein: 7.5 g/dL (ref 6.4–8.2)

## 2012-09-02 LAB — URINALYSIS, COMPLETE
Bilirubin,UR: NEGATIVE
Glucose,UR: >=500 mg/dL (ref 0–75)
Ketone: NEGATIVE
Nitrite: POSITIVE
Ph: 5 (ref 4.5–8.0)
Protein: 30
WBC UR: 79 /HPF (ref 0–5)

## 2012-09-02 LAB — TROPONIN I: Troponin-I: <0.02 ng/mL

## 2012-09-03 ENCOUNTER — Inpatient Hospital Stay: Payer: Self-pay | Admitting: Family Medicine

## 2012-09-03 LAB — CBC WITH DIFFERENTIAL/PLATELET
Basophil #: 0 10*3/uL (ref 0.0–0.1)
Basophil %: 0.5 %
Eosinophil #: 0.3 10*3/uL (ref 0.0–0.7)
Eosinophil %: 7 %
Lymphocyte #: 1.9 10*3/uL (ref 1.0–3.6)
MCHC: 34.4 g/dL (ref 32.0–36.0)
Monocyte #: 0.3 x10 3/mm (ref 0.2–0.9)
Monocyte %: 7.6 %
Neutrophil #: 1.9 10*3/uL (ref 1.4–6.5)
Platelet: 97 10*3/uL — ABNORMAL LOW (ref 150–440)
RBC: 3.4 10*6/uL — ABNORMAL LOW (ref 3.80–5.20)

## 2012-09-03 LAB — BASIC METABOLIC PANEL
Anion Gap: 5 — ABNORMAL LOW (ref 7–16)
EGFR (African American): >60
EGFR (Non-African Amer.): >60
Potassium: 4 mmol/L (ref 3.5–5.1)

## 2012-09-04 LAB — BASIC METABOLIC PANEL
Anion Gap: 3 — ABNORMAL LOW (ref 7–16)
Calcium, Total: 8.7 mg/dL (ref 8.5–10.1)
Chloride: 114 mmol/L — ABNORMAL HIGH (ref 98–107)
Creatinine: 0.75 mg/dL (ref 0.60–1.30)
Glucose: 81 mg/dL (ref 65–99)
Osmolality: 285 (ref 275–301)
Potassium: 3.7 mmol/L (ref 3.5–5.1)

## 2012-09-04 LAB — CBC WITH DIFFERENTIAL/PLATELET
Basophil #: 0 10*3/uL (ref 0.0–0.1)
Eosinophil #: 0.3 10*3/uL (ref 0.0–0.7)
Eosinophil %: 7.5 %
Lymphocyte #: 2 10*3/uL (ref 1.0–3.6)
MCH: 31 pg (ref 26.0–34.0)
MCHC: 34 g/dL (ref 32.0–36.0)
MCV: 91 fL (ref 80–100)
Monocyte %: 7.6 %
Neutrophil %: 35.3 %
Platelet: 89 10*3/uL — ABNORMAL LOW (ref 150–440)
RBC: 3.43 10*6/uL — ABNORMAL LOW (ref 3.80–5.20)
RDW: 13.8 % (ref 11.5–14.5)

## 2012-09-04 LAB — MAGNESIUM: Magnesium: 1.8 mg/dL

## 2012-09-08 ENCOUNTER — Telehealth: Payer: Self-pay | Admitting: Family Medicine

## 2012-09-08 NOTE — Telephone Encounter (Signed)
No phone number in system. Contact person's numbers have been disconnected. Letter mailed advising patient to call and schedule appointment. Advised to schedule 30 minute appointment.

## 2012-09-08 NOTE — Telephone Encounter (Signed)
Received discharge summary from [facility] for hospitalization 4/12-14/2014 for metabolic encephalopathy and UTI - can we call to schedule appt with me to re establish and hosp f/u in next 1-2 wks?

## 2012-12-04 ENCOUNTER — Inpatient Hospital Stay (HOSPITAL_COMMUNITY)
Admission: AD | Admit: 2012-12-04 | Discharge: 2012-12-08 | DRG: 871 | Disposition: A | Discharge status: Other Acute Inpatient Hospital | Payer: Medicare Other | Source: Other Acute Inpatient Hospital | Attending: Pulmonary Disease | Admitting: Pulmonary Disease

## 2012-12-04 ENCOUNTER — Emergency Department: Payer: Self-pay | Admitting: Emergency Medicine

## 2012-12-04 DIAGNOSIS — I059 Rheumatic mitral valve disease, unspecified: Secondary | ICD-10-CM | POA: Diagnosis present

## 2012-12-04 DIAGNOSIS — D61818 Other pancytopenia: Secondary | ICD-10-CM | POA: Diagnosis present

## 2012-12-04 DIAGNOSIS — R791 Abnormal coagulation profile: Secondary | ICD-10-CM | POA: Diagnosis present

## 2012-12-04 DIAGNOSIS — E039 Hypothyroidism, unspecified: Secondary | ICD-10-CM | POA: Diagnosis present

## 2012-12-04 DIAGNOSIS — I2789 Other specified pulmonary heart diseases: Secondary | ICD-10-CM | POA: Diagnosis present

## 2012-12-04 DIAGNOSIS — F411 Generalized anxiety disorder: Secondary | ICD-10-CM

## 2012-12-04 DIAGNOSIS — I4891 Unspecified atrial fibrillation: Secondary | ICD-10-CM | POA: Diagnosis present

## 2012-12-04 DIAGNOSIS — Z22322 Carrier or suspected carrier of Methicillin resistant Staphylococcus aureus: Secondary | ICD-10-CM

## 2012-12-04 DIAGNOSIS — J9819 Other pulmonary collapse: Secondary | ICD-10-CM | POA: Diagnosis present

## 2012-12-04 DIAGNOSIS — E785 Hyperlipidemia, unspecified: Secondary | ICD-10-CM | POA: Diagnosis present

## 2012-12-04 DIAGNOSIS — R933 Abnormal findings on diagnostic imaging of other parts of digestive tract: Secondary | ICD-10-CM

## 2012-12-04 DIAGNOSIS — E872 Acidosis, unspecified: Secondary | ICD-10-CM | POA: Diagnosis present

## 2012-12-04 DIAGNOSIS — A419 Sepsis, unspecified organism: Principal | ICD-10-CM

## 2012-12-04 DIAGNOSIS — E119 Type 2 diabetes mellitus without complications: Secondary | ICD-10-CM

## 2012-12-04 DIAGNOSIS — E871 Hypo-osmolality and hyponatremia: Secondary | ICD-10-CM | POA: Diagnosis present

## 2012-12-04 DIAGNOSIS — E876 Hypokalemia: Secondary | ICD-10-CM | POA: Diagnosis present

## 2012-12-04 DIAGNOSIS — D72819 Decreased white blood cell count, unspecified: Secondary | ICD-10-CM

## 2012-12-04 DIAGNOSIS — R6521 Severe sepsis with septic shock: Secondary | ICD-10-CM

## 2012-12-04 DIAGNOSIS — N170 Acute kidney failure with tubular necrosis: Secondary | ICD-10-CM | POA: Diagnosis present

## 2012-12-04 DIAGNOSIS — K529 Noninfective gastroenteritis and colitis, unspecified: Secondary | ICD-10-CM

## 2012-12-04 DIAGNOSIS — N12 Tubulo-interstitial nephritis, not specified as acute or chronic: Secondary | ICD-10-CM | POA: Diagnosis present

## 2012-12-04 DIAGNOSIS — Z794 Long term (current) use of insulin: Secondary | ICD-10-CM

## 2012-12-04 DIAGNOSIS — I079 Rheumatic tricuspid valve disease, unspecified: Secondary | ICD-10-CM | POA: Diagnosis present

## 2012-12-04 DIAGNOSIS — N390 Urinary tract infection, site not specified: Secondary | ICD-10-CM

## 2012-12-04 DIAGNOSIS — E873 Alkalosis: Secondary | ICD-10-CM | POA: Diagnosis present

## 2012-12-04 LAB — COMPREHENSIVE METABOLIC PANEL
ALT: 19 U/L (ref 0–35)
AST: 56 U/L — ABNORMAL HIGH (ref 0–37)
Albumin: 2.1 g/dL — ABNORMAL LOW (ref 3.5–5.2)
Anion Gap: 14 (ref 7–16)
BUN: 75 mg/dL — ABNORMAL HIGH (ref 7–18)
BUN: 71 mg/dL — ABNORMAL HIGH (ref 6–23)
Calcium, Total: 9.2 mg/dL (ref 8.5–10.1)
Creatinine, Ser: 2.7 mg/dL — ABNORMAL HIGH (ref 0.50–1.10)
Creatinine: 3.5 mg/dL — ABNORMAL HIGH (ref 0.60–1.30)
EGFR (African American): 14 — ABNORMAL LOW
GFR calc non Af Amer: 16 mL/min — ABNORMAL LOW (ref >90)
Glucose, Bld: 344 mg/dL — ABNORMAL HIGH (ref 70–99)
Glucose: 134 mg/dL — ABNORMAL HIGH (ref 65–99)
Osmolality: 285 (ref 275–301)
Potassium: 5.3 mmol/L — ABNORMAL HIGH (ref 3.5–5.1)
SGOT(AST): 70 U/L — ABNORMAL HIGH (ref 15–37)
SGPT (ALT): 22 U/L (ref 12–78)
Sodium: 130 mmol/L — ABNORMAL LOW (ref 136–145)
Total Bilirubin: 2.7 mg/dL — ABNORMAL HIGH (ref 0.3–1.2)
Total Protein: 6.3 g/dL — ABNORMAL LOW (ref 6.4–8.2)

## 2012-12-04 LAB — URINALYSIS, ROUTINE W REFLEX MICROSCOPIC
Nitrite: NEGATIVE
Specific Gravity, Urine: 1.016 (ref 1.005–1.030)
Urobilinogen, UA: 0.2 mg/dL (ref 0.0–1.0)

## 2012-12-04 LAB — BASIC METABOLIC PANEL
Calcium, Total: 7.4 mg/dL — ABNORMAL LOW (ref 8.5–10.1)
Co2: 10 mmol/L — CL (ref 21–32)
Glucose: 377 mg/dL — ABNORMAL HIGH (ref 65–99)
Potassium: 4.7 mmol/L (ref 3.5–5.1)

## 2012-12-04 LAB — DIFFERENTIAL

## 2012-12-04 LAB — URINALYSIS, COMPLETE
Hyaline Cast: 14
Ketone: NEGATIVE
Protein: NEGATIVE
Specific Gravity: 1.015 (ref 1.003–1.030)
WBC UR: 9 /HPF (ref 0–5)

## 2012-12-04 LAB — CBC WITH DIFFERENTIAL/PLATELET
Bands: 8 %
Basophils Relative: 0 % (ref 0–1)
Eosinophils Relative: 0 % (ref 0–5)
HCT: 27.2 % — ABNORMAL LOW (ref 35.0–47.0)
HGB: 9.1 g/dL — ABNORMAL LOW (ref 12.0–16.0)
Hemoglobin: 9.6 g/dL — ABNORMAL LOW (ref 12.0–15.0)
Lymphocytes: 4 %
Lymphs Abs: 0.4 10*3/uL — ABNORMAL LOW (ref 0.7–4.0)
MCH: 31.1 pg (ref 26.0–34.0)
MCV: 92 fL (ref 80–100)
MCV: 86.1 fL (ref 78.0–100.0)
Monocytes Absolute: 1.2 10*3/uL — ABNORMAL HIGH (ref 0.1–1.0)
Monocytes Relative: 6 % (ref 3–12)
Monocytes: 3 %
Neutrophils Relative %: 92 % — ABNORMAL HIGH (ref 43–77)
RBC: 3.09 MIL/uL — ABNORMAL LOW (ref 3.87–5.11)
Segmented Neutrophils: 80 %
WBC: 17 10*3/uL — ABNORMAL HIGH (ref 3.6–11.0)
WBC: 20.5 10*3/uL — ABNORMAL HIGH (ref 4.0–10.5)

## 2012-12-04 LAB — CBC
HCT: 28.1 % — ABNORMAL LOW (ref 35.0–47.0)
HGB: 9.6 g/dL — ABNORMAL LOW (ref 12.0–16.0)
MCH: 31.1 pg (ref 26.0–34.0)
MCHC: 34.3 g/dL (ref 32.0–36.0)
MCV: 91 fL (ref 80–100)
Platelet: 22 10*3/uL — CL (ref 150–440)
RBC: 3.09 10*6/uL — ABNORMAL LOW (ref 3.80–5.20)
WBC: 12 10*3/uL — ABNORMAL HIGH (ref 3.6–11.0)

## 2012-12-04 LAB — URINE MICROSCOPIC-ADD ON

## 2012-12-04 LAB — TROPONIN I: Troponin-I: 0.06 ng/mL — ABNORMAL HIGH

## 2012-12-04 LAB — FIBRINOGEN: Fibrinogen: 572 mg/dL — ABNORMAL HIGH (ref 210–470)

## 2012-12-04 LAB — TSH: Thyroid Stimulating Horm: 2.54 u[IU]/mL

## 2012-12-04 LAB — D-DIMER, QUANTITATIVE: D-Dimer, Quant: 10.55 ug/mL-FEU — ABNORMAL HIGH (ref 0.00–0.48)

## 2012-12-04 LAB — LACTATE DEHYDROGENASE: LDH: 206 U/L (ref 81–246)

## 2012-12-04 LAB — PROTIME-INR: INR: 1.1

## 2012-12-04 MED ORDER — SODIUM CHLORIDE 0.9 % IV SOLN
250.0000 mL | INTRAVENOUS | Status: DC | PRN
  Administered 2012-12-04: 250 mL via INTRAVENOUS

## 2012-12-04 MED ORDER — SODIUM CHLORIDE 0.9 % IV SOLN
INTRAVENOUS | Status: DC
  Administered 2012-12-04 – 2012-12-05 (×4): via INTRAVENOUS

## 2012-12-04 MED ORDER — TRAVOPROST (BAK FREE) 0.004 % OP SOLN
1.0000 [drp] | Freq: Every day | OPHTHALMIC | Status: DC
  Administered 2012-12-05 – 2012-12-07 (×4): 1 [drp] via OPHTHALMIC
  Filled 2012-12-04: qty 2.5

## 2012-12-04 MED ORDER — NOREPINEPHRINE BITARTRATE 1 MG/ML IJ SOLN
2.0000 ug/min | INTRAVENOUS | Status: DC
  Administered 2012-12-05: 15 ug/min via INTRAVENOUS
  Administered 2012-12-05: 10 ug/min via INTRAVENOUS
  Administered 2012-12-05: 15 ug/min via INTRAVENOUS
  Administered 2012-12-05: 30 ug/min via INTRAVENOUS
  Administered 2012-12-05: 10 ug/min via INTRAVENOUS
  Administered 2012-12-05: 25 ug/min via INTRAVENOUS
  Administered 2012-12-05: 20 ug/min via INTRAVENOUS
  Filled 2012-12-04 (×4): qty 8

## 2012-12-04 MED ORDER — INSULIN ASPART 100 UNIT/ML ~~LOC~~ SOLN: 2.0000 [IU] | SUBCUTANEOUS | Status: DC

## 2012-12-04 MED ORDER — LEVOTHYROXINE SODIUM 88 MCG PO TABS
88.0000 ug | ORAL_TABLET | Freq: Every day | ORAL | Status: DC
  Administered 2012-12-05 – 2012-12-07 (×3): 88 ug via ORAL
  Filled 2012-12-04 (×5): qty 1

## 2012-12-04 MED ORDER — SODIUM CHLORIDE 0.9 % IV SOLN
1.0000 g | Freq: Once | INTRAVENOUS | Status: AC
  Administered 2012-12-04: 1 g via INTRAVENOUS
  Filled 2012-12-04: qty 1

## 2012-12-04 MED ORDER — SERTRALINE HCL 50 MG PO TABS
50.0000 mg | ORAL_TABLET | Freq: Every day | ORAL | Status: DC
  Administered 2012-12-05 – 2012-12-07 (×3): 50 mg via ORAL
  Filled 2012-12-04 (×4): qty 1

## 2012-12-04 MED ORDER — INSULIN GLARGINE 100 UNIT/ML ~~LOC~~ SOLN: 15.0000 [IU] | Freq: Every day | SUBCUTANEOUS | Status: DC

## 2012-12-04 MED ORDER — LACTATED RINGERS IV BOLUS (SEPSIS)
1000.0000 mL | Freq: Once | INTRAVENOUS | Status: AC
  Administered 2012-12-04: 1000 mL via INTRAVENOUS

## 2012-12-04 MED ORDER — SODIUM CHLORIDE 0.9 % IV SOLN
INTRAVENOUS | Status: DC
  Filled 2012-12-04 (×2): qty 1

## 2012-12-04 MED ORDER — SODIUM CHLORIDE 0.9 % IV SOLN
500.0000 mg | Freq: Two times a day (BID) | INTRAVENOUS | Status: DC
  Administered 2012-12-05 – 2012-12-08 (×7): 500 mg via INTRAVENOUS
  Filled 2012-12-04 (×8): qty 0.5

## 2012-12-04 MED ORDER — PANTOPRAZOLE SODIUM 40 MG IV SOLR
40.0000 mg | Freq: Every day | INTRAVENOUS | Status: DC
  Administered 2012-12-04 – 2012-12-07 (×4): 40 mg via INTRAVENOUS
  Filled 2012-12-04 (×6): qty 40

## 2012-12-04 NOTE — Progress Notes (Signed)
ANTIBIOTIC CONSULT NOTE - INITIAL  Pharmacy Consult for Meropenem Indication: Colitis and UTI  Allergies  Allergen Reactions  . Hydrocodone-Acetaminophen Other (See Comments)    dizziness  . Meloxicam     REACTION: VOMITING  . Penicillins Rash    Patient Measurements: Height: 5' 0.63" (154 cm) Weight: 133 lb 13.1 oz (60.7 kg) IBW/kg (Calculated) : 46.95 Adjusted Body Weight: 60.7 kg  Vital Signs:   Intake/Output from previous day:   Intake/Output from this shift:    Labs: No results found for this basename: WBC, HGB, PLT, LABCREA, CREATININE,  in the last 72 hours Estimated Creatinine Clearance: 50.3 ml/min (by C-G formula based on Cr of 0.6). No results found for this basename: VANCOTROUGH, VANCOPEAK, VANCORANDOM, GENTTROUGH, GENTPEAK, GENTRANDOM, TOBRATROUGH, TOBRAPEAK, TOBRARND, AMIKACINPEAK, AMIKACINTROU, AMIKACIN,  in the last 72 hours   Microbiology: No results found for this or any previous visit (from the past 720 hour(s)).  Medical History: Past Medical History  Diagnosis Date  . Hypothyroidism 11/01/1995  . Anxiety 11/01/1995  . Diabetes mellitus type II 05/25/1996    insulin-requiring  . Hyperlipemia 06/25/2002  . Atopic dermatitis   . History of MRI of brain and brain stem 07/10/2005    w amd w/o no mass/infarct small vess dz  . Dehydration     Hosp [facility], hyperglycemia and ketoacidosis (stopped insulin)  . Pancytopenia 06/24-7/05/2007    Tissue D/O  . History of gallstones 11/21/2007    Abd U/S multip gallstones contracted GB CBD ULN Tr hydronephr R  . Acute cholecystitis 12/1/-05/20/08    [facility], s/p lap choleycystectomy CTD DM w/hypoglycemic sz in ER  . Gallstones 05/11/08    abd U/S gallstones pericholeycystic fluid [facility]    Medications:  Scheduled:  . [START ON 12/05/2012] insulin aspart  2-6 Units Subcutaneous Q4H  . [START ON 12/05/2012] insulin glargine  15 Units Subcutaneous Daily  . meropenem (MERREM) IV  1 g Intravenous Once  .  pantoprazole (PROTONIX) IV  40 mg Intravenous QHS   Assessment: 78 yo female transferred from [facility] with UTI and colitis.  Pharmacy asked to dose meropenem.  Per RN report, patient received metronidazole and vancomycin at [facility], cannot find records of this in patient chart.  Scr from [place] = 3.5; estimated CrCl ~ 15 ml/min  Goal of Therapy:  Resolution of infection  Plan:  1. Meropenem 1g IV x 1 now (ordered while obtaining Scr). 2. Then start meropenem 500 mg IV q 12 hrs. 3. Monitor renal function and clinical course, adjust doses as necessary.  Tad Moore, BCPS  Clinical Pharmacist Pager [phone]  12/04/2012 9:56 PM

## 2012-12-04 NOTE — H&P (Signed)
Name: Gina Andersen MRN: [id number] DOB: 1935-05-13    LOS: 0  Referring Provider:  [facility]   Reason for Referral:  Septic shock  PULMONARY / CRITICAL CARE MEDICINE  HPI:  Gina Andersen is a 78 y/o woman with past medical history significant for DM, hypothyroidism and pancytopenia who presented to [facility] today with 3 days of diarrhea, and lethargy.  She was found to be hypotensive and acidotic.  A urinalysis was consistent with UTI and CT abdominal consistent with pyelonephritis and possible colitis.  She was given a dose of vancomycin and flagyl.  A central line was placed in the femoral vein and she was started on levophed after she remained hypotensive after 2-3L fluids.  She was transferred to [facility] ICU for further care.  On presentation to the ICU the, Gina Andersen is confused.  She does not respond to questions appropriately.  She does have lower abdominal tenderness.   Past Medical History  Diagnosis Date  . Hypothyroidism 11/01/1995  . Anxiety 11/01/1995  . Diabetes mellitus type II 05/25/1996    insulin-requiring  . Hyperlipemia 06/25/2002  . Atopic dermatitis   . History of MRI of brain and brain stem 07/10/2005    w amd w/o no mass/infarct small vess dz  . Dehydration     Hosp [facility], hyperglycemia and ketoacidosis (stopped insulin)  . Pancytopenia 06/24-7/05/2007    Tissue D/O  . History of gallstones 11/21/2007    Abd U/S multip gallstones contracted GB CBD ULN Tr hydronephr R  . Acute cholecystitis 12/1/-05/20/08    [facility], s/p lap choleycystectomy CTD DM w/hypoglycemic sz in ER  . Gallstones 05/11/08    abd U/S gallstones pericholeycystic fluid [facility]   Past Surgical History  Procedure Laterality Date  . Partial hysterectomy      OV intact  . Carpel tunn      right  . Trigger finger release      left  . Ganglionectomy    . Cholecystectomy     Prior to Admission medications   Medication Sig Start Date End Date Taking? Authorizing Provider   cyanocobalamin (,VITAMIN B-12,) 1000 MCG/ML injection Inject 1,000 mcg into the muscle every 30 (thirty) days.      Historical Provider, MD  Emollient (EUCERIN PLUS INTENSIVE REPAIR) 2.5-10 % CREA Apply to skin two times a day as directed     Historical Provider, MD  esomeprazole (NEXIUM) 40 MG capsule Take 1 capsule (40 mg total) by mouth daily before breakfast. 07/30/11   Joaquim Nam, MD  estropipate (OGEN) 1.5 MG tablet Take 1.5 mg by mouth daily.      Historical Provider, MD  glucose blood (FREESTYLE LITE) test strip Test blood sugar 4 times a day 10/08/10   Arta Silence, MD  glucose blood test strip Check blood sugar 3 times daily 10/16/10   Arta Silence, MD  hydrocortisone (ANUSOL-HC) 25 MG suppository Use one suppository 3-4 times a day as needed for rectal pain     Historical Provider, MD  hydroxychloroquine (PLAQUENIL) 200 MG tablet Take by mouth daily.      Historical Provider, MD  insulin aspart (NOVOLOG) 100 UNIT/ML injection Use sliding scale, check sugar before each meal and use sliding scale as directed 150-200 4units, 201-250 8units, 251-350 12 units >351 14units 10/23/10   Arta Silence, MD  insulin glargine (LANTUS) 100 UNIT/ML injection Inject 15 Units into the skin at bedtime. 12 units in the evening or as directed 10/23/10  Arta Silence, MD  Insulin Syringe-Needle U-100 (BD INSULIN SYRINGE ULTRAFINE) 31G X 5/16" 0.5 ML MISC Use as directed per sliding scale 06/17/11   Arta Silence, MD  Lancets (FREESTYLE) lancets Test blood sugar 3 times a day 10/08/10   Arta Silence, MD  levothyroxine (SYNTHROID) 88 MCG tablet Take 1 tablet (88 mcg total) by mouth daily. 01/30/11 01/30/12  Joaquim Nam, MD  potassium chloride SA (KLOR-CON M20) 20 MEQ tablet Take 1 tablet (20 mEq total) by mouth 2 (two) times daily. 02/26/11   Arta Silence, MD  sertraline (ZOLOFT) 50 MG tablet Take 1/2 tab by mouth for one week and then take one tab a day. 01/08/11 01/08/12  Arta Silence, MD  travoprost, benzalkonium, (TRAVATAN) 0.004 % ophthalmic solution Place 1 drop into both eyes at bedtime.      Historical Provider, MD   Allergies Allergies  Allergen Reactions  . Hydrocodone-Acetaminophen Other (See Comments)    dizziness  . Meloxicam     REACTION: VOMITING  . Penicillins Rash    Family History Family History  Problem Relation Age of Onset  . Kidney disease Mother   . Diabetes Mother   . Hypertension Mother   . Diabetes Brother   . Stroke Brother   . Stroke Brother     CVA  . Diabetes Brother    Social History  reports that she has never smoked. She has never used smokeless tobacco. She reports that she does not drink alcohol or use illicit drugs.  Review Of Systems:  Could not be obtained as pt is altered  Brief patient description:  78 y/o woman with DM, pancytopenia with septic shock  Events Since Admission: Femoral line placement, transfer to [facility] ICU  Current Status: critical  Vital Signs: Pulse Rate:  [83-92] 83 (07/13 2215) Resp:  [18-24] 18 (07/13 2215) BP: (103-112)/(47-53) 103/53 mmHg (07/13 2145) SpO2:  [98 %-100 %] 98 % (07/13 2215) Weight:  [60.7 kg (133 lb 13.1 oz)] 60.7 kg (133 lb 13.1 oz) (07/13 2100)  Physical Examination: General:  Appears stated age, no acute distress Neuro:  Confused, does not answer questions appropriately or follow commands.  HEENT:  PERRL, EOMI, pupils 2mm, reactive, OP clear Neck:  Supple, no masses Cardiovascular:  NRRR, no mrg Lungs:  CTAB, no w/r/r Abdomen:  Soft, tender to palpation in bilateral lower quadrants, no rebound, no guarding, hypoactive bowel sounds Musculoskeletal:  No edema, no clubbing Skin:  No bruising or other lesions  Principal Problem:   Septic shock(785.52) Active Problems:   UTI (lower urinary tract infection)   Colitis   ASSESSMENT AND PLAN  PULMONARY No results found for this basename: PHART, PCO2, PCO2ART, PO2ART, HCO3, O2SAT,  in the last 168  hours Ventilator Settings:   CXR:  none ETT:  none  A:  tachypneic - compensatory respiratory alkalosis P:   2L [place] Continuous pulse ox  CARDIOVASCULAR No results found for this basename: TROPONINI, LATICACIDVEN,  O2SATVEN, PROBNP,  in the last 168 hours ECG:  NSR Lines: femoral CVC placed prior to transfer  A: Hypotension secondary to septic shock P:  Levophed - titrate to keep MAPs >49mmHg  RENAL No results found for this basename: NA, K, CL, CO2, BUN, CREATININE, CALCIUM, MG, PHOS,  in the last 168 hours Intake/Output     07/13 0701 - 07/14 0700   I.V. (mL/kg) 8 (0.1)   Total Intake(mL/kg) 8 (0.1)   Urine (mL/kg/hr) 75   Total Output  75   Net -67        Foley:  In place  A:  AKI - likely 2/2 to hypovolemia and septic shock P:   Avoid nephrotoxic agents  Dose all meds for appropriate cr clearance Follow Cr daily and monitor urine output.  If Cr not improving obtain renal ultrasound  GASTROINTESTINAL No results found for this basename: AST, ALT, ALKPHOS, BILITOT, PROT, ALBUMIN,  in the last 168 hours  A:  Diarrhea and evidence of colitis on CT scan P:   C. Dif ordered, will be sent if continues to have diarrhea Meropenem started for colitis   HEMATOLOGIC No results found for this basename: HGB, HCT, PLT, INR, APTT,  in the last 168 hours A:  Thrombocytopenia - hx of pancytopenia P:  Likely secondary to sepsis and underlying pancytopenia.  Baseline counts unknown.  No heparin, SCDs for DVT prophylaxis.   INFECTIOUS No results found for this basename: WBC, PROCALCITON,  in the last 168 hours Cultures: Urine, blood Antibiotics: One dose vanc given at OSH One dose flagyl given at OSH Meropenem started 7/13  A:  Colitis, pyelonephritis P:   Vanc and flagyl given at OSH Meropenem started on arrival Vanc level ordered for 0500 - redose pending levels and renal function.  ENDOCRINE No results found for this basename: GLUCAP,  in the last 168 hours A:   DM2 on lantus   P:   Home dose of lantus is 34U daily - started 15U daily here Regular SSI  NEUROLOGIC  A:  Delerium P:   Likely due to critical illness Pt had head CT at OSH with no acute findings.  Lights on during the day, off at night, TV off at night, reorient pt often.   BEST PRACTICE / DISPOSITION Level of Care:  ICU Primary Service:  PCCM Consultants:  none Code Status:  full Diet:  NPO DVT Px:  SCDs GI Px:  protonix Skin Integrity:  good Social / Family:  Son present prior to transfer, not present in ICU at time of admission   Hendel Gatliff, Charlann Lange., M.D. Pulmonary and Critical Care Medicine [facility] Pager: [phone]  I spent 35 minutes of critical care time in the care of this patient seperate from procedures which are documented elsewhere.   12/04/2012, 10:34 PM

## 2012-12-05 ENCOUNTER — Inpatient Hospital Stay (HOSPITAL_COMMUNITY): Payer: Medicare Other

## 2012-12-05 DIAGNOSIS — D72819 Decreased white blood cell count, unspecified: Secondary | ICD-10-CM

## 2012-12-05 DIAGNOSIS — R6521 Severe sepsis with septic shock: Secondary | ICD-10-CM

## 2012-12-05 DIAGNOSIS — F411 Generalized anxiety disorder: Secondary | ICD-10-CM

## 2012-12-05 DIAGNOSIS — K5289 Other specified noninfective gastroenteritis and colitis: Secondary | ICD-10-CM

## 2012-12-05 LAB — GLUCOSE, CAPILLARY
Glucose-Capillary: 195 mg/dL — ABNORMAL HIGH (ref 70–99)
Glucose-Capillary: 224 mg/dL — ABNORMAL HIGH (ref 70–99)
Glucose-Capillary: 322 mg/dL — ABNORMAL HIGH (ref 70–99)

## 2012-12-05 LAB — TSH: TSH: 1.127 u[IU]/mL (ref 0.350–4.500)

## 2012-12-05 LAB — POCT I-STAT 3, ART BLOOD GAS (G3+)
Acid-base deficit: 15 mmol/L — ABNORMAL HIGH (ref 0.0–2.0)
Bicarbonate: 9.3 mEq/L — ABNORMAL LOW (ref 20.0–24.0)
O2 Saturation: 95 %
O2 Saturation: 95 %
Patient temperature: 98.7
Patient temperature: 98.5
TCO2: 8 mmol/L (ref 0–100)
pCO2 arterial: 16.8 mmHg — CL (ref 35.0–45.0)
pCO2 arterial: 17.1 mmHg — CL (ref 35.0–45.0)
pH, Arterial: 7.26 — ABNORMAL LOW (ref 7.350–7.450)
pH, Arterial: 7.263 — ABNORMAL LOW (ref 7.350–7.450)
pO2, Arterial: 84 mmHg (ref 80.0–100.0)
pO2, Arterial: 80 mmHg (ref 80.0–100.0)

## 2012-12-05 LAB — CBC
Hemoglobin: 10 g/dL — ABNORMAL LOW (ref 12.0–15.0)
MCH: 31.2 pg (ref 26.0–34.0)
Platelets: 34 10*3/uL — ABNORMAL LOW (ref 150–400)
RBC: 3.21 MIL/uL — ABNORMAL LOW (ref 3.87–5.11)
WBC: 8.1 10*3/uL (ref 4.0–10.5)

## 2012-12-05 LAB — PROTIME-INR
INR: 1.36 (ref 0.00–1.49)
Prothrombin Time: 16.4 seconds — ABNORMAL HIGH (ref 11.6–15.2)

## 2012-12-05 LAB — BASIC METABOLIC PANEL
CO2: 10 mEq/L — CL (ref 19–32)
Calcium: 7.8 mg/dL — ABNORMAL LOW (ref 8.4–10.5)
Chloride: 102 mEq/L (ref 96–112)
Creatinine, Ser: 2.91 mg/dL — ABNORMAL HIGH (ref 0.50–1.10)
GFR calc Af Amer: 17 mL/min — ABNORMAL LOW (ref >90)
Glucose, Bld: 207 mg/dL — ABNORMAL HIGH (ref 70–99)
Potassium: 3.9 mEq/L (ref 3.5–5.1)
Sodium: 129 mEq/L — ABNORMAL LOW (ref 135–145)

## 2012-12-05 LAB — LACTIC ACID, PLASMA: Lactic Acid, Venous: 4 mmol/L — ABNORMAL HIGH (ref 0.5–2.2)

## 2012-12-05 LAB — VANCOMYCIN, RANDOM: Vancomycin Rm: 8.8 ug/mL

## 2012-12-05 LAB — MAGNESIUM: Magnesium: 1.6 mg/dL (ref 1.5–2.5)

## 2012-12-05 LAB — MRSA PCR SCREENING: MRSA by PCR: POSITIVE — AB

## 2012-12-05 LAB — CLOSTRIDIUM DIFFICILE BY PCR: Toxigenic C. Difficile by PCR: NEGATIVE

## 2012-12-05 MED ORDER — INSULIN ASPART 100 UNIT/ML ~~LOC~~ SOLN: 2.0000 [IU] | SUBCUTANEOUS | Status: DC

## 2012-12-05 MED ORDER — MAGNESIUM SULFATE 40 MG/ML IJ SOLN
2.0000 g | Freq: Once | INTRAMUSCULAR | Status: AC
  Administered 2012-12-05: 2 g via INTRAVENOUS
  Filled 2012-12-05: qty 50

## 2012-12-05 MED ORDER — MIDAZOLAM HCL 2 MG/2ML IJ SOLN: 2.0000 mg | Freq: Once | INTRAMUSCULAR | Status: AC

## 2012-12-05 MED ORDER — VANCOMYCIN HCL IN DEXTROSE 1-5 GM/200ML-% IV SOLN
1000.0000 mg | INTRAVENOUS | Status: DC
  Administered 2012-12-05: 1000 mg via INTRAVENOUS
  Filled 2012-12-05 (×2): qty 200

## 2012-12-05 MED ORDER — SODIUM CHLORIDE 0.9 % IV BOLUS (SEPSIS)
500.0000 mL | Freq: Once | INTRAVENOUS | Status: AC
  Administered 2012-12-05: 500 mL via INTRAVENOUS

## 2012-12-05 MED ORDER — MUPIROCIN 2 % EX OINT
1.0000 "application " | TOPICAL_OINTMENT | Freq: Two times a day (BID) | CUTANEOUS | Status: DC
  Administered 2012-12-05 – 2012-12-08 (×7): 1 via NASAL
  Filled 2012-12-05 (×2): qty 22

## 2012-12-05 MED ORDER — INSULIN ASPART 100 UNIT/ML ~~LOC~~ SOLN
0.0000 [IU] | Freq: Three times a day (TID) | SUBCUTANEOUS | Status: DC
  Administered 2012-12-05: 2 [IU] via SUBCUTANEOUS
  Administered 2012-12-05: 8 [IU] via SUBCUTANEOUS
  Administered 2012-12-05: 5 [IU] via SUBCUTANEOUS
  Administered 2012-12-05: 11 [IU] via SUBCUTANEOUS
  Administered 2012-12-06: 2 [IU] via SUBCUTANEOUS
  Administered 2012-12-06: 3 [IU] via SUBCUTANEOUS

## 2012-12-05 MED ORDER — MIDAZOLAM HCL 2 MG/2ML IJ SOLN
INTRAMUSCULAR | Status: AC
  Administered 2012-12-05: 2 mg via INTRAVENOUS
  Filled 2012-12-05: qty 2

## 2012-12-05 MED ORDER — DEXMEDETOMIDINE HCL IN NACL 200 MCG/50ML IV SOLN
0.4000 ug/kg/h | INTRAVENOUS | Status: DC
  Administered 2012-12-05 (×3): 0.4 ug/kg/h via INTRAVENOUS
  Filled 2012-12-05 (×2): qty 50

## 2012-12-05 MED ORDER — SODIUM BICARBONATE 8.4 % IV SOLN
INTRAVENOUS | Status: DC
  Administered 2012-12-05 – 2012-12-06 (×2): via INTRAVENOUS
  Filled 2012-12-05 (×4): qty 150

## 2012-12-05 MED ORDER — CHLORHEXIDINE GLUCONATE CLOTH 2 % EX PADS
6.0000 | MEDICATED_PAD | Freq: Every day | CUTANEOUS | Status: DC
  Administered 2012-12-05 – 2012-12-06 (×2): 6 via TOPICAL

## 2012-12-05 NOTE — Procedures (Signed)
Arterial Catheter Insertion Procedure Note Gina Andersen [id number] [birth year]  Procedure: Insertion of Arterial Catheter  Indications: Blood pressure monitoring  Procedure Details Consent: Unable to obtain consent because of altered level of consciousness. Time Out: Verified patient identification, verified procedure, site/side was marked, verified correct patient position, special equipment/implants available, medications/allergies/relevent history reviewed, required imaging and test results available.  Performed  Maximum sterile technique was used including cap, gloves, gown, hand hygiene, mask and sheet. Skin prep: Chlorhexidine; local anesthetic administered 20 gauge catheter was inserted into right radial artery using the Seldinger technique.  Evaluation Blood flow good; BP tracing good. Complications: No apparent complications.   Arterial catheter placed into pt. Right radial artery. Procedure performed per MD order and per protocol. Sterile procedures were met. No apparent complications.  Adela Ports 12/05/2012

## 2012-12-05 NOTE — Progress Notes (Signed)
Name: Gina Andersen MRN: [id number] DOB: Oct 15, 1934  ELECTRONIC ICU PHYSICIAN NOTE  Problem:  Recurrent hypotension   Intake/Output Summary (Last 24 hours) at 12/05/12 0328 Last data filed at 12/05/12 0200  Gross per 24 hour  Intake 466.85 ml  Output    300 ml  Net 166.85 ml      Intervention:  Fluid bolus, hold precedex for now   Sandrea Hughs 12/05/2012, 3:28 AM

## 2012-12-05 NOTE — Progress Notes (Signed)
PULMONARY  / CRITICAL CARE MEDICINE  Name: BLESSED GIRDNER MRN: [id number] DOB: 1934-10-24    ADMISSION DATE:  12/04/2012  REFERRING MD :  [facility] PRIMARY SERVICE: CCM  CHIEF COMPLAINT:  Septic shock  BRIEF PATIENT DESCRIPTION:  Ms. Gina Andersen is a 78 y/o woman with past medical history significant for DM, hypothyroidism and pancytopenia who presented to [facility] today with 3 days of diarrhea, and lethargy. She was found to be hypotensive and acidotic. A urinalysis was consistent with UTI and CT abdominal consistent with pyelonephritis and possible colitis. She was given a dose of vancomycin and flagyl. A central line was placed in the femoral vein and she was started on levophed after she remained hypotensive after 2-3L fluids. She was transferred to [facility] ICU for further care.   SIGNIFICANT EVENTS / STUDIES:  7/13 - Admit and Central line placed; started on pressors 7/13 - UA consistent with UTI; C diff negative  LINES / TUBES: 7/13 Right Femoral Triple Lumen >>>  CULTURES: Blood Cx 7/13 >>> Urine Cx 7/13 >>>  ANTIBIOTICS: 7/13 - Meropenem >>>  SUBJECTIVE:  Follows commands but not Alert and Oriented Stable on 2L [place] Remains on pressors  VITAL SIGNS: Temp:  [97.8 F (36.6 C)-100.1 F (37.8 C)] 98.7 F (37.1 C) (07/14 0815) Pulse Rate:  [78-133] 102 (07/14 0700) Resp:  [17-31] 25 (07/14 0700) BP: (72-159)/(40-115) 105/53 mmHg (07/14 0700) SpO2:  [96 %-100 %] 97 % (07/14 0700) Arterial Line BP: (87-115)/(40-47) 115/47 mmHg (07/14 0700) Weight:  [133 lb 13.1 oz (60.7 kg)-144 lb 2.9 oz (65.4 kg)] 144 lb 2.9 oz (65.4 kg) (07/14 0438) HEMODYNAMICS:   VENTILATOR SETTINGS:   INTAKE / OUTPUT: Intake/Output     07/13 0701 - 07/14 0700 07/14 0701 - 07/15 0700   I.V. (mL/kg) 1034.7 (15.8)    IV Piggyback 100    Total Intake(mL/kg) 1134.7 (17.3)    Urine (mL/kg/hr) 395    Total Output 395     Net +739.7           PHYSICAL EXAMINATION: General: elderly  lady, resting in bed, NAD Neuro:  Follows commands, but is confused (not oriented) Cardiovascular:  Tachy. Regular rhythm. No m/r/g Lungs:  CTAB.  Abdomen:  Soft, tender to palpation of lower abdomen. No rebound or guarding. Musculoskeletal: No LE edema.  Skin:  Intact  LABS:  Recent Labs Lab 12/04/12 2156 12/05/12 0444  HGB 9.6* 10.0*  WBC 20.5* 8.1  PLT 43* 34*  NA 125* 129*  K 5.0 3.9  CL 99 102  CO2 10* 10*  GLUCOSE 344* 207*  BUN 71* 75*  CREATININE 2.70* 2.89*  CALCIUM 7.5* 7.9*  MG 1.7 1.6  AST 56*  --   ALT 19  --   ALKPHOS 108  --   BILITOT 2.7*  --   PROT 5.7*  --   ALBUMIN 2.1*  --   APTT 33  --   LATICACIDVEN 2.5*  --     Recent Labs Lab 12/04/12 2343 12/05/12 0356  GLUCAP 322* 195*    ASSESSMENT / PLAN:  PULMONARY  A: No acute issues P:  2L [place]  Continuous pulse ox  pcxr for atx abg  CARDIOVASCULAR  A: Hypotension secondary to septic shock  P:  Continuing Levophed - titrate to keep MAPs >59mmHg, consider MAP 60 Repeat lactic acid level Remains on high pressor needs, needs cvl neck  Will place as on 20 levo Dc fem when able today  Aline remains  and needed Cortisol level 32, no steroids needed Ensure tsh done Need cvp  RENAL  A: AKI - secondary to hypovolemia and septic shock; Hypomagnesemia; Hyponatremia Anion gap metabolic acidosis, Bicarb 10 Half NON AG (from diarrhea) R/o hdro P:  Stat renal US, I cannot see Ct done or report Continue IVF's bmet to q12h Repleting Mg Start Bicarb gtt and continue saline Await cvp  GASTROINTESTINAL  A: Diarrhea and evidence of colitis on CT scan  P:  C. Dif - Neg Patient on Meropenem dc (narrow) when clinically improved Continue vanc NPO  HEMATOLOGIC  A: Thrombocytopenia - hx of pancytopenia  P:  Likely secondary to sepsis and underlying pancytopenia. Baseline Hb 11-12; Baseline PLT 90-100 No heparin, SCDs for DVT prophylaxis.  Daily CBC  INFECTIOUS  A: Colitis, pyelonephritis   P:  Vanc and flagyl given at [place] Meropenem started on arrival (7/13) Stat renal US r/o hydro  ENDOCRINE  A: DM2 on lantus  P:  CBG's Q4 SSI - Moderate No lantus as NPO  NEUROLOGIC  A: Delerium  P:  Pt had head CT at  with no acute findings.  Delirium likely secondary to acute illness.  Unknown baseline status abg now  TODAY'S SUMMARY: Continuing Abx and Levophed, add bicarb, abg, high pressor needs remain, renal US STAT  Jayce Cook DO Family Medicine PGY-2  I have fully examined this patient and agree with above findings.    And edited in full  Ccm time 30 in   Mcarthur Rossetti. Tyson Alias, MD, FACP Pgr: [phone] [place] [facility]

## 2012-12-05 NOTE — Progress Notes (Signed)
ANTIBIOTIC CONSULT NOTE - INITIAL  Pharmacy Consult:  Vancomycin Indication:  Sepsis / Colitis  Allergies  Allergen Reactions  . Hydrocodone-Acetaminophen Other (See Comments)    dizziness  . Meloxicam     REACTION: VOMITING  . Penicillins Rash    Patient Measurements: Height: 5' 0.63" (154 cm) Weight: 144 lb 2.9 oz (65.4 kg) IBW/kg (Calculated) : 46.95  Vital Signs: Temp: 98.7 F (37.1 C) (07/14 0815) Temp src: Oral (07/14 0815) BP: 121/59 mmHg (07/14 1000) Pulse Rate: 91 (07/14 1000) Intake/Output from previous day: 07/13 0701 - 07/14 0700 In: 1134.7 [I.V.:1034.7; IV Piggyback:100] Out: 395 [Urine:395] Intake/Output from this shift: Total I/O In: 578.2 [I.V.:428.2; IV Piggyback:150] Out: 60 [Urine:60]  Labs:  Recent Labs  12/04/12 2156 12/05/12 0444  WBC 20.5* 8.1  HGB 9.6* 10.0*  PLT 43* 34*  CREATININE 2.70* 2.89*   Estimated Creatinine Clearance: 14.4 ml/min (by C-G formula based on Cr of 2.89).  Recent Labs  12/05/12 0444  VANCORANDOM 8.8     Microbiology: Recent Results (from the past 720 hour(s))  MRSA PCR SCREENING     Status: Abnormal   Collection Time    12/04/12  8:56 PM      Result Value Range Status   MRSA by PCR POSITIVE (*) NEGATIVE Final   Comment:            The GeneXpert MRSA Assay (FDA     approved for NASAL specimens     only), is one component of a     comprehensive MRSA colonization     surveillance program. It is not     intended to diagnose MRSA     infection nor to guide or     monitor treatment for     MRSA infections.     RESULT CALLED TO, READ BACK BY AND VERIFIED WITH:     KEB,B RN [id number] 12/05/12 MITCHELL,L  CLOSTRIDIUM DIFFICILE BY PCR     Status: None   Collection Time    12/04/12 10:35 PM      Result Value Range Status   C difficile by pcr NEGATIVE  NEGATIVE Final    Medical History: Past Medical History  Diagnosis Date  . Hypothyroidism 11/01/1995  . Anxiety 11/01/1995  . Diabetes mellitus type II  05/25/1996    insulin-requiring  . Hyperlipemia 06/25/2002  . Atopic dermatitis   . History of MRI of brain and brain stem 07/10/2005    w amd w/o no mass/infarct small vess dz  . Dehydration     Hosp [facility], hyperglycemia and ketoacidosis (stopped insulin)  . Pancytopenia 06/24-7/05/2007    Tissue D/O  . History of gallstones 11/21/2007    Abd U/S multip gallstones contracted GB CBD ULN Tr hydronephr R  . Acute cholecystitis 12/1/-05/20/08    [facility], s/p lap choleycystectomy CTD DM w/hypoglycemic sz in ER  . Gallstones 05/11/08    abd U/S gallstones pericholeycystic fluid [facility]       Assessment: 77 YOF [facility] with UTI, colitis, and sepsis.  Patient received vancomycin and Flagyl prior to transfer, and vancomycin random level was sub-therapeutic this AM.  Vancomycin and Merrem to continue.  Patient's renal function is improving (SCr was 3.5 at [facility]).  Merrem 7/13 >> Vanc 7/13 >>  7/14 VR = 8.8 mcg/mL (post 750mg  x 1 7/13 PM )  7/13 MRSA PCR - positive 7/13 urine cx - colle cted 7/13 blood cx - collected 7/13 C.diff PCR - negative   Goal  of Therapy:  Vancomycin trough level 15-20 mcg/ml   Plan:  - Vanc 1gm IV Q48H - Merrem 500 mg IV Q12H - Monitor renal function and clinical course, adjust doses as necessary    Labrenda Lasky D. Laney Potash, PharmD, BCPS Pager:  [phone] 12/05/2012, 11:37 AM

## 2012-12-05 NOTE — Progress Notes (Signed)
eLink Physician-Brief Progress Note Patient Name: Gina Andersen DOB: 11/18/1934 MRN: [id number]  Date of Service  12/05/2012   HPI/Events of Note   BC 2/2 positive GNR  eICU Interventions  On approp ABX   Intervention Category Major Interventions: Infection - evaluation and management  Shan Levans 12/05/2012, 7:11 PM

## 2012-12-05 NOTE — Progress Notes (Signed)
Name: Gina Andersen MRN: [id number] DOB: August 21, 1934  ELECTRONIC ICU PHYSICIAN NOTE  Problem:  Agitation, confusion with tachycardia and hypertension c/w agitated delirium  Intervention:  Try precedex infusion  Sandrea Hughs 12/05/2012, 1:30 AM

## 2012-12-05 NOTE — Procedures (Signed)
Central Venous Catheter Insertion Procedure Note - Ultrasound Guided Gina Andersen [id number] 1934-07-30  Procedure: Insertion of Central Venous Catheter Indications: Assessment of intravascular volume, Drug and/or fluid administration and Frequent blood sampling  Procedure Details Consent: Risks of procedure as well as the alternatives and risks of each were explained to the (patient/caregiver).  Consent for procedure obtained. Time Out: Verified patient identification, verified procedure, site/side was marked, verified correct patient position, special equipment/implants available, medications/allergies/relevent history reviewed, required imaging and test results available.  Performed  Maximum sterile technique was used including antiseptics, cap, gloves, gown, hand hygiene, mask and sheet. Skin prep: Chlorhexidine; local anesthetic administered A antimicrobial bonded/coated triple lumen catheter was placed in the right internal jugular vein using the Seldinger technique.  Evaluation Blood flow good Complications: No apparent complications Patient did tolerate procedure well. Chest X-ray ordered to verify placement.  CXR: pending.   [place] gudiance Will dc fem when confirmed Tolerated well , no desat  Mcarthur Rossetti. Tyson Alias, MD, FACP Pgr: [phone] [place] [facility]

## 2012-12-05 NOTE — Progress Notes (Signed)
UR Completed.  Tommy Minichiello [name] [phone] 12/05/2012  

## 2012-12-05 NOTE — Progress Notes (Signed)
Inpatient Diabetes Program Recommendations  AACE/ADA: New Consensus Statement on Inpatient Glycemic Control (2013)  Target Ranges:  Prepandial:   less than 140 mg/dL      Peak postprandial:   less than 180 mg/dL (1-2 hours)      Critically ill patients:  140 - 180 mg/dL   Reason for Visit: Hyperglycemia  Inpatient Diabetes Program Recommendations Correction (SSI): Currently on moderate correction scale tid HgbA1C: Last known Hgb A1C was 10.1  Note:  Results for AZHARIA, SURRATT (MRN [id number]) as of 12/05/2012 09:26  Ref. Range 12/04/2012 23:43 12/05/2012 03:56 12/05/2012 08:14  Glucose-Capillary Latest Range: 70-99 mg/dL 409 (H) 811 (H) 914 (H)   Request updated Hgb A1C.   If patient to remain NPO, please change correction to every 4 hours. Thank you.  Magdalyn Arenivas S. Elsie Lincoln, RN, CNS, CDE Inpatient Diabetes Program, team pager [phone]

## 2012-12-06 ENCOUNTER — Inpatient Hospital Stay (HOSPITAL_COMMUNITY): Payer: Medicare Other

## 2012-12-06 DIAGNOSIS — I059 Rheumatic mitral valve disease, unspecified: Secondary | ICD-10-CM

## 2012-12-06 LAB — URINE MICROSCOPIC-ADD ON

## 2012-12-06 LAB — URINALYSIS, ROUTINE W REFLEX MICROSCOPIC
Glucose, UA: NEGATIVE mg/dL
pH: 5.5 (ref 5.0–8.0)

## 2012-12-06 LAB — BASIC METABOLIC PANEL
BUN: 74 mg/dL — ABNORMAL HIGH (ref 6–23)
BUN: 77 mg/dL — ABNORMAL HIGH (ref 6–23)
CO2: 17 mEq/L — ABNORMAL LOW (ref 19–32)
Calcium: 7.5 mg/dL — ABNORMAL LOW (ref 8.4–10.5)
Calcium: 7.9 mg/dL — ABNORMAL LOW (ref 8.4–10.5)
Chloride: 103 mEq/L (ref 96–112)
Creatinine, Ser: 2.42 mg/dL — ABNORMAL HIGH (ref 0.50–1.10)
Creatinine, Ser: 2.3 mg/dL — ABNORMAL HIGH (ref 0.50–1.10)
GFR calc Af Amer: 21 mL/min — ABNORMAL LOW (ref >90)
GFR calc non Af Amer: 18 mL/min — ABNORMAL LOW (ref >90)
Glucose, Bld: 126 mg/dL — ABNORMAL HIGH (ref 70–99)
Glucose, Bld: 279 mg/dL — ABNORMAL HIGH (ref 70–99)
Sodium: 131 mEq/L — ABNORMAL LOW (ref 135–145)

## 2012-12-06 LAB — POCT I-STAT 3, ART BLOOD GAS (G3+)
O2 Saturation: 95 %
pCO2 arterial: 23.2 mmHg — ABNORMAL LOW (ref 35.0–45.0)
pO2, Arterial: 72 mmHg — ABNORMAL LOW (ref 80.0–100.0)

## 2012-12-06 LAB — GLUCOSE, CAPILLARY: Glucose-Capillary: 144 mg/dL — ABNORMAL HIGH (ref 70–99)

## 2012-12-06 LAB — CBC
HCT: 26.4 % — ABNORMAL LOW (ref 36.0–46.0)
MCH: 30.9 pg (ref 26.0–34.0)
MCV: 84.1 fL (ref 78.0–100.0)
RBC: 3.14 MIL/uL — ABNORMAL LOW (ref 3.87–5.11)
RDW: 15.1 % (ref 11.5–15.5)
WBC: 23.6 10*3/uL — ABNORMAL HIGH (ref 4.0–10.5)

## 2012-12-06 LAB — LACTATE DEHYDROGENASE: LDH: 263 U/L — ABNORMAL HIGH (ref 94–250)

## 2012-12-06 LAB — URINE CULTURE

## 2012-12-06 MED ORDER — DIGOXIN 0.25 MG/ML IJ SOLN
0.5000 mg | INTRAMUSCULAR | Status: AC
  Administered 2012-12-06: 0.5 mg via INTRAVENOUS
  Filled 2012-12-06: qty 2

## 2012-12-06 MED ORDER — ONDANSETRON HCL 4 MG PO TABS
8.0000 mg | ORAL_TABLET | Freq: Four times a day (QID) | ORAL | Status: DC | PRN
  Administered 2012-12-06: 8 mg via ORAL
  Filled 2012-12-06: qty 2

## 2012-12-06 MED ORDER — SODIUM CHLORIDE 0.9 % IV SOLN
INTRAVENOUS | Status: DC
  Administered 2012-12-06: 1000 mL via INTRAVENOUS

## 2012-12-06 MED ORDER — ONDANSETRON 8 MG/NS 50 ML IVPB: 8.0000 mg | Freq: Four times a day (QID) | INTRAVENOUS | Status: DC | PRN

## 2012-12-06 MED ORDER — SODIUM CHLORIDE 0.9 % IV BOLUS (SEPSIS)
500.0000 mL | Freq: Once | INTRAVENOUS | Status: AC
  Administered 2012-12-06: 500 mL via INTRAVENOUS

## 2012-12-06 MED ORDER — FENTANYL CITRATE 0.05 MG/ML IJ SOLN
12.5000 ug | INTRAMUSCULAR | Status: DC | PRN
  Administered 2012-12-06: 12.5 ug via INTRAVENOUS
  Filled 2012-12-06: qty 2

## 2012-12-06 MED ORDER — PHENYLEPHRINE HCL 10 MG/ML IJ SOLN
30.0000 ug/min | INTRAMUSCULAR | Status: DC
  Administered 2012-12-06: 30 ug/min via INTRAVENOUS
  Administered 2012-12-07: 20 ug/min via INTRAVENOUS
  Filled 2012-12-06 (×2): qty 1

## 2012-12-06 MED ORDER — FENTANYL CITRATE 0.05 MG/ML IJ SOLN
25.0000 ug | INTRAMUSCULAR | Status: DC | PRN
  Administered 2012-12-06 – 2012-12-08 (×3): 50 ug via INTRAVENOUS
  Filled 2012-12-06 (×2): qty 2

## 2012-12-06 MED ORDER — FENTANYL CITRATE 0.05 MG/ML IJ SOLN
INTRAMUSCULAR | Status: AC
  Administered 2012-12-06: 12.5 ug via INTRAVENOUS
  Filled 2012-12-06: qty 2

## 2012-12-06 MED ORDER — VASOPRESSIN 20 UNIT/ML IJ SOLN
0.0300 [IU]/min | INTRAVENOUS | Status: DC
  Filled 2012-12-06: qty 2.5

## 2012-12-06 NOTE — Progress Notes (Signed)
Echocardiogram 2D Echocardiogram has been performed.  Gina Andersen 12/06/2012, 11:21 AM

## 2012-12-06 NOTE — Care Management Note (Signed)
    Page 1 of 1   12/06/2012     3:17:39 PM   CARE MANAGEMENT NOTE 12/06/2012  Patient:  Gina Andersen, Gina Andersen   Account Number:  [id number]  Date Initiated:  12/05/2012  Documentation initiated by:  Avie Arenas  Subjective/Objective Assessment:   Sepsis - UTI     Action/Plan:   Anticipated DC Date:  12/12/2012   Anticipated DC Plan:  HOME W HOME HEALTH SERVICES      DC Planning Services  CM consult      Choice offered to / List presented to:             Status of service:  In process, will continue to follow Medicare Important Message given?   (If response is "NO", the following Medicare IM given date fields will be blank) Date Medicare IM given:   Date Additional Medicare IM given:    Discharge Disposition:    Per UR Regulation:  Reviewed for med. necessity/level of care/duration of stay  If discussed at Long Length of Stay Meetings, dates discussed:    Comments:  ContactKara Andersen [phone] guardian or                 Gina Andersen [phone] or [phone]                 Gina Andersen [phone]  12-06-12 3:12pm Avie Arenas, [phone] Talked with daughters Gina Andersen and Gina Andersen on phone.  Agreeable to [facility] option of discharge but would like [facility] in [place] as it is closer to [place] where they live.  Aware that discharge may happen as early as 48 hours from now. CM will continue to follow.  [facility] referrals placed to both [facility] and [facility].

## 2012-12-06 NOTE — Progress Notes (Signed)
eLink Physician-Brief Progress Note Patient Name: Gina Andersen DOB: 03-Jan-1935 MRN: [id number]  Date of Service  12/06/2012   HPI/Events of Note    Recent Labs Lab 12/04/12 2156 12/05/12 0444 12/06/12 0300  PLT 43* 34* 12*   On camera exam: RASS -1 and focussed attention on RN. RN says he does not see any unifocal weakness  eICU Interventions  Platelet tx   Intervention Category Intermediate Interventions: Diagnostic test evaluation;Coagulopathy - evaluation and management  Tamecia Mcdougald 12/06/2012, 4:56 AM

## 2012-12-06 NOTE — Progress Notes (Signed)
PULMONARY  / CRITICAL CARE MEDICINE  Name: Gina Andersen MRN: [id number] DOB: 10-30-1934    ADMISSION DATE:  12/04/2012  REFERRING MD :  [facility] PRIMARY SERVICE: CCM  CHIEF COMPLAINT:  Septic shock  BRIEF PATIENT DESCRIPTION:  Gina Andersen is a 78 y/o woman with past medical history significant for DM, hypothyroidism and pancytopenia who presented to [facility] today with 3 days of diarrhea, and lethargy. She was found to be hypotensive and acidotic. A urinalysis was consistent with UTI and CT abdominal consistent with pyelonephritis and possible colitis. She was given a dose of vancomycin and flagyl. A central line was placed in the femoral vein and she was started on levophed after she remained hypotensive after 2-3L fluids. She was transferred to [facility] ICU for further care.   SIGNIFICANT EVENTS / STUDIES:  7/13 - Admit and Central line placed; started on pressors 7/13 - UA consistent with UTI; C diff negative 7/14 - Renal US: Bilateral pleural effusions. Mild left hydronephrosis of uncertain etiology.  Questionable 7 mm nonshadowing calculus upper pole left kidney. 7/15 - PLT count 12; Transfused Platelets  LINES / TUBES: 7/13 Right Femoral Triple Lumen >>> 7/14 7/14 Right IJ Triple Lumen >>>  CULTURES: Blood Cx 7/13 >>> Urine Cx 7/13 >>>  ANTIBIOTICS: 7/13 - Meropenem >>> 7/13 vanc>>>  SUBJECTIVE:  Remains on pressors  VITAL SIGNS: Temp:  [97.6 F (36.4 C)-99.5 F (37.5 C)] 97.8 F (36.6 C) (07/15 0657) Pulse Rate:  [74-97] 77 (07/15 0615) Resp:  [20-31] 21 (07/15 0615) BP: (70-121)/(47-60) 87/52 mmHg (07/15 0600) SpO2:  [97 %-99 %] 98 % (07/15 0600) Arterial Line BP: (85-151)/(39-58) 99/42 mmHg (07/15 0615) Weight:  [142 lb 3.2 oz (64.5 kg)-147 lb 7.8 oz (66.9 kg)] 147 lb 7.8 oz (66.9 kg) (07/15 0452) HEMODYNAMICS: CVP:  [5 mmHg-15 mmHg] 5 mmHg VENTILATOR SETTINGS:   INTAKE / OUTPUT: Intake/Output     07/14 0701 - 07/15 0700 07/15 0701 -  07/16 0700   I.V. (mL/kg) 2771.5 (41.4)    Blood 595.5    IV Piggyback 400    Total Intake(mL/kg) 3767 (56.3)    Urine (mL/kg/hr) 305 (0.2)    Total Output 305     Net +3462           PHYSICAL EXAMINATION: General: elderly lady, resting in bed, NAD Neuro:  Confused, Not following commands  Cardiovascular:  RRR. No m/r/g Lungs: Crackles noted Left lung base Abdomen:  Soft, tender to palpation of lower abdomen. +BS Musculoskeletal: No LE edema.  Skin:  Intact  LABS:  Recent Labs Lab 12/04/12 2156 12/05/12 0444 12/05/12 1150 12/05/12 1159 12/05/12 1214 12/05/12 1742 12/05/12 2332 12/06/12 0300  HGB 9.6* 10.0*  --   --   --   --   --  9.7*  WBC 20.5* 8.1  --   --   --   --   --  23.6*  PLT 43* 34*  --   --   --   --   --  12*  NA 125* 129* 127*  --   --   --  131* 130*  K 5.0 3.9 3.9  --   --   --  3.3* 3.5  CL 99 102 101  --   --   --  104 103  CO2 10* 10* 8*  --   --   --  14* 11*  GLUCOSE 344* 207* 237*  --   --   --  279* 160*  BUN 71* 75* 76*  --   --   --  76* 74*  CREATININE 2.70* 2.89* 2.91*  --   --   --  2.51* 2.42*  CALCIUM 7.5* 7.9* 7.8*  --   --   --  7.5* 7.3*  MG 1.7 1.6  --   --   --   --   --  2.1  AST 56*  --   --   --   --   --   --   --   ALT 19  --   --   --   --   --   --   --   ALKPHOS 108  --   --   --   --   --   --   --   BILITOT 2.7*  --   --   --   --   --   --   --   PROT 5.7*  --   --   --   --   --   --   --   ALBUMIN 2.1*  --   --   --   --   --   --   --   APTT 33  --   --   --   --   --   --   --   INR  --   --  1.36  --   --   --   --   --   LATICACIDVEN 2.5*  --  4.0*  --   --   --   --   --   PHART  --   --   --  7.260* 7.263* 7.291*  --   --   PCO2ART  --   --   --  17.1* 16.8* 19.3*  --   --   PO2ART  --   --   --  84.0 83.0 80.0  --   --     Recent Labs Lab 12/05/12 1228 12/05/12 1627 12/05/12 2031 12/06/12 0046 12/06/12 0407  GLUCAP 231* 285* 276* 230* 164*    ASSESSMENT / PLAN:  PULMONARY  A: Uncompensated met  acidosis resolving P:  2L [place]  Continuous pulse ox  pcxr for atx abg reviewed, see renal  CARDIOVASCULAR  A: Hypotension secondary to septic shock, some improvement in pressor needs P:  Continuing Levophed - titrate to keep MAPs >60; currently on 10 mcg CVP - 14, concern some edema pcxr Lactic acid this am.  A-line remains and needed Cortisol level 32, no steroids needed TSH WNL; Continuing Synthroid Add vasopressin if remains on pressors over next 6 hrs  RENAL  A: AKI - secondary to hypovolemia and septic shock, ATN Hypomagnesemia - resolved Hyponatremia Anion gap metabolic acidosis (larger portion given albumin correction) and Non-AG metabolic acidosis (from prior diarrhea), r/o rta 4  P:  Renal US - mild L hydro, unlikely to be main source and pressors better- urology in future likely needed Continue NS and Sodium Bicarb x 500 cc more liter then dc as then non ag treated in full and bicarb will not change outcome in lactic acidosis BMP q12h Magnesium repleted Concern edema but on min o2 No lasix as on min o2 and responded to volume with pressors Goal consider 15 cc/hr output  GASTROINTESTINAL  A: Diarrhea and evidence of colitis on CT scan  P:  C. Dif - Neg Will continue Meropenem and Vanc - will de-escalate when  clinically improved Lactic acid trend, if up - repeat CT  / surgery  ldh Full liquids attempts  HEMATOLOGIC  A: Thrombocytopenia - hx of pancytopenia, leukocytosis, dilutional affects to plat as well P:  Likely secondary to sepsis and underlying pancytopenia. Baseline Hb 11-12; Baseline PLT 90-100 No heparin, SCDs for DVT prophylaxis.  Transfused 1 unit of PLT this am (for PLT count of 12) - repeat cbc Treat infection  INFECTIOUS  A: Colitis, pyelonephritis, Gram neg reported from [facility] P:  Ct repeat abdo may be needed Urology in future Will continue Vanc (2 sources?) and Meropenem and de-escalate based on culture results and clinical  improvement Repeat UA Repeat BC Echo for veg  ENDOCRINE  A: DM2 P:  CBG's Q4 SSI - Moderate  NEUROLOGIC  A: Delerium  P:  Treat infection  TODAY'S SUMMARY: Continuing Abx and Levophed, Continuing IV fluids and Bicarb x 500 cc, follow lactic acid trend Everlene Other DO Family Medicine PGY-2  I have fully examined this patient and agree with above findings.    And edited in full  Ccm time 30 min   Mcarthur Rossetti. Tyson Alias, MD, FACP Pgr: [phone] [facility]

## 2012-12-06 NOTE — Progress Notes (Signed)
Pt's urine output only approximately 51ml/2hr.  Dr. Tyson Alias aware.  No new orders at this time.  Pt is receiving a ns bolus at this time.  Will continue to monitor.

## 2012-12-06 NOTE — Progress Notes (Signed)
Nursing bedside swallow evaluation done.  Pt does well swallowing applesauce without any s/s of aspiration.  No cough noted.  Able to clear throat.  Drinks thin liquids well.

## 2012-12-07 ENCOUNTER — Inpatient Hospital Stay (HOSPITAL_COMMUNITY): Payer: Medicare Other

## 2012-12-07 DIAGNOSIS — R933 Abnormal findings on diagnostic imaging of other parts of digestive tract: Secondary | ICD-10-CM

## 2012-12-07 LAB — CBC
Hemoglobin: 10.1 g/dL — ABNORMAL LOW (ref 12.0–15.0)
MCHC: 36.7 g/dL — ABNORMAL HIGH (ref 30.0–36.0)
RBC: 3.26 MIL/uL — ABNORMAL LOW (ref 3.87–5.11)

## 2012-12-07 LAB — BASIC METABOLIC PANEL
BUN: 81 mg/dL — ABNORMAL HIGH (ref 6–23)
BUN: 92 mg/dL — ABNORMAL HIGH (ref 6–23)
CO2: 17 mEq/L — ABNORMAL LOW (ref 19–32)
Calcium: 7.4 mg/dL — ABNORMAL LOW (ref 8.4–10.5)
Creatinine, Ser: 2.17 mg/dL — ABNORMAL HIGH (ref 0.50–1.10)
GFR calc non Af Amer: 21 mL/min — ABNORMAL LOW (ref >90)
GFR calc non Af Amer: 21 mL/min — ABNORMAL LOW (ref >90)
Glucose, Bld: 240 mg/dL — ABNORMAL HIGH (ref 70–99)
Glucose, Bld: 287 mg/dL — ABNORMAL HIGH (ref 70–99)
Potassium: 3.5 mEq/L (ref 3.5–5.1)

## 2012-12-07 LAB — LACTIC ACID, PLASMA: Lactic Acid, Venous: 3.2 mmol/L — ABNORMAL HIGH (ref 0.5–2.2)

## 2012-12-07 LAB — URINE CULTURE: Colony Count: 60000

## 2012-12-07 LAB — GLUCOSE, CAPILLARY
Glucose-Capillary: 293 mg/dL — ABNORMAL HIGH (ref 70–99)
Glucose-Capillary: 149 mg/dL — ABNORMAL HIGH (ref 70–99)

## 2012-12-07 LAB — TROPONIN I: Troponin I: <0.3 ng/mL (ref <0.30)

## 2012-12-07 LAB — COMPREHENSIVE METABOLIC PANEL
ALT: 25 U/L (ref 0–35)
AST: 60 U/L — ABNORMAL HIGH (ref 0–37)
Alkaline Phosphatase: 166 U/L — ABNORMAL HIGH (ref 39–117)
CO2: 16 mEq/L — ABNORMAL LOW (ref 19–32)
GFR calc Af Amer: 23 mL/min — ABNORMAL LOW (ref >90)
Glucose, Bld: 319 mg/dL — ABNORMAL HIGH (ref 70–99)
Potassium: 3.9 mEq/L (ref 3.5–5.1)
Sodium: 130 mEq/L — ABNORMAL LOW (ref 135–145)
Total Protein: 4.9 g/dL — ABNORMAL LOW (ref 6.0–8.3)

## 2012-12-07 LAB — PREPARE PLATELET PHERESIS: Unit division: 0

## 2012-12-07 MED ORDER — INSULIN ASPART 100 UNIT/ML ~~LOC~~ SOLN
0.0000 [IU] | Freq: Three times a day (TID) | SUBCUTANEOUS | Status: DC
  Administered 2012-12-07: 11 [IU] via SUBCUTANEOUS
  Administered 2012-12-07: 20 [IU] via SUBCUTANEOUS

## 2012-12-07 MED ORDER — PHENYLEPHRINE HCL 10 MG/ML IJ SOLN
30.0000 ug/min | INTRAVENOUS | Status: DC
  Filled 2012-12-07 (×3): qty 4

## 2012-12-07 MED ORDER — FUROSEMIDE 10 MG/ML IJ SOLN
20.0000 mg | Freq: Four times a day (QID) | INTRAMUSCULAR | Status: AC
  Administered 2012-12-07 (×2): 20 mg via INTRAVENOUS
  Filled 2012-12-07 (×2): qty 2

## 2012-12-07 MED ORDER — VASOPRESSIN 20 UNIT/ML IJ SOLN
0.0300 [IU]/min | INTRAMUSCULAR | Status: DC
  Administered 2012-12-07: 0.03 [IU]/min via INTRAVENOUS
  Filled 2012-12-07: qty 2.5

## 2012-12-07 MED ORDER — INSULIN ASPART 100 UNIT/ML ~~LOC~~ SOLN
2.0000 [IU] | SUBCUTANEOUS | Status: DC
  Administered 2012-12-07: 4 [IU] via SUBCUTANEOUS
  Administered 2012-12-07: 2 [IU] via SUBCUTANEOUS
  Administered 2012-12-08: 6 [IU] via SUBCUTANEOUS
  Administered 2012-12-08: 4 [IU] via SUBCUTANEOUS

## 2012-12-07 NOTE — Progress Notes (Signed)
PULMONARY  / CRITICAL CARE MEDICINE  Name: Gina Andersen MRN: [id number] DOB: 11-05-1934    ADMISSION DATE:  12/04/2012  REFERRING MD :  [facility] PRIMARY SERVICE: CCM  CHIEF COMPLAINT:  Septic shock  BRIEF PATIENT DESCRIPTION:  Gina Andersen is a 78 y/o woman with past medical history significant for DM, hypothyroidism and pancytopenia who presented to [facility] today with 3 days of diarrhea, and lethargy. She was found to be hypotensive and acidotic. A urinalysis was consistent with UTI and CT abdominal consistent with pyelonephritis and possible colitis. She was given a dose of vancomycin and flagyl. A central line was placed in the femoral vein and she was started on levophed after she remained hypotensive after 2-3L fluids. She was transferred to [facility] ICU for further care.   SIGNIFICANT EVENTS / STUDIES:  7/13 - Admit and Central line placed; started on pressors 7/13 - UA consistent with UTI; C diff negative 7/14 - Renal US: Bilateral pleural effusions. Mild left hydronephrosis of uncertain etiology.  Questionable 7 mm nonshadowing calculus upper pole left kidney. 7/15 - PLT count 12; Transfused Platelets 7/15 - Developed Afib with RVR. Levo switched to Neosynephrine.  Digoxin given x 1.  7/15 - Echo completed -  ejection fraction was in the range of 35% to 40%. There is diffuse hypokinesis, disproportionately more severe in the inferior, inferolateral and apical walls. Moderate mitral regurgitation. Moderate tricuspid regurgitation.  LINES / TUBES: 7/13 Right Femoral Triple Lumen >>> 7/14 7/14 Right IJ Triple Lumen >>>  CULTURES: Blood Cx 7/13 >>> NGTD Blood Cx 7/15 >>> Urine Cx 7/13 >>> E coli 60,000 CFU  ANTIBIOTICS: 7/13 Meropenem >>> 7/13 vanc>>>  SUBJECTIVE:  Remains on pressors  VITAL SIGNS: Temp:  [97.6 F (36.4 C)-98.4 F (36.9 C)] 98 F (36.7 C) (07/16 0352) Pulse Rate:  [49-142] 83 (07/16 0600) Resp:  [10-24] 18 (07/16 0600) BP:  (68-103)/(36-71) 100/41 mmHg (07/16 0500) SpO2:  [94 %-99 %] 94 % (07/16 0600) Arterial Line BP: (87-152)/(40-62) 152/52 mmHg (07/16 0600) HEMODYNAMICS: CVP:  [10 mmHg-17 mmHg] 13 mmHg VENTILATOR SETTINGS:   INTAKE / OUTPUT: Intake/Output     07/15 0701 - 07/16 0700   I.V. (mL/kg) 1404.3 (21)   Total Intake(mL/kg) 1404.3 (21)   Urine (mL/kg/hr) 730 (0.5)   Total Output 730   Net +674.3        PHYSICAL EXAMINATION: General: elderly lady, resting in bed, NAD Neuro: Awake, follows commands Cardiovascular:  RRR. No m/r/g Lungs: Crackles noted bibasilar Abdomen:  Soft, tender to palpation of lower abdomen. +BS Musculoskeletal: No LE edema.  Skin:  Intact  LABS:  Recent Labs Lab 12/04/12 2156 12/05/12 0444 12/05/12 1150  12/05/12 1214 12/05/12 1742  12/06/12 0300 12/06/12 0750 12/06/12 0800 12/06/12 1120 12/06/12 2336 12/07/12 0412  HGB 9.6* 10.0*  --   --   --   --   --  9.7*  --   --   --   --  10.1*  WBC 20.5* 8.1  --   --   --   --   --  23.6*  --   --   --   --  26.8*  PLT 43* 34*  --   --   --   --   --  12*  --   --   --   --  24*  NA 125* 129* 127*  --   --   --   < > 130*  --   --  133* 131* 130*  K 5.0 3.9 3.9  --   --   --   < > 3.5  --   --  3.7 3.9 3.9  CL 99 102 101  --   --   --   < > 103  --   --  105 104 101  CO2 10* 10* 8*  --   --   --   < > 11*  --   --  17* 17* 16*  GLUCOSE 344* 207* 237*  --   --   --   < > 160*  --   --  126* 240* 319*  BUN 71* 75* 76*  --   --   --   < > 74*  --   --  77* 81* 88*  CREATININE 2.70* 2.89* 2.91*  --   --   --   < > 2.42*  --   --  2.30* 2.17* 2.24*  CALCIUM 7.5* 7.9* 7.8*  --   --   --   < > 7.3*  --   --  7.9* 7.4* 7.4*  MG 1.7 1.6  --   --   --   --   --  2.1  --   --   --   --   --   AST 56*  --   --   --   --   --   --   --   --   --   --   --  60*  ALT 19  --   --   --   --   --   --   --   --   --   --   --  25  ALKPHOS 108  --   --   --   --   --   --   --   --   --   --   --  166*  BILITOT 2.7*  --   --    --   --   --   --   --   --   --   --   --  2.7*  PROT 5.7*  --   --   --   --   --   --   --   --   --   --   --  4.9*  ALBUMIN 2.1*  --   --   --   --   --   --   --   --   --   --   --  1.8*  APTT 33  --   --   --   --   --   --   --   --   --   --   --   --   INR  --   --  1.36  --   --   --   --   --   --   --   --   --   --   LATICACIDVEN 2.5*  --  4.0*  --   --   --   --   --   --  2.6*  --   --   --   PHART  --   --   --   < > 7.263* 7.291*  --   --  7.382  --   --   --   --   PCO2ART  --   --   --   < >  16.8* 19.3*  --   --  23.2*  --   --   --   --   PO2ART  --   --   --   < > 83.0 80.0  --   --  72.0*  --   --   --   --   < > = values in this interval not displayed.  Recent Labs Lab 12/06/12 1222 12/06/12 1621 12/06/12 1928 12/06/12 2340 12/07/12 0350  GLUCAP 144* 182* 189* 225* 285*   CXR: Pulmonary edema, L pleural effusion  ASSESSMENT / PLAN:  PULMONARY  A: Chest xray showing Pulmonary edema but no distress , on mni O2 P:  Stable on 2L [place]  Continuous pulse ox  May require Lasix today  CARDIOVASCULAR  A: Hypotension secondary to septic shock - still requiring pressors Paroxysmal afib - developed Afib with RVR on 7/15 CHF (EF 35-40% on Echo) - r/o septic cardiomyopathy Mild pa htn 40 P:  Continuing Neosynephrine - titrate to keep MAPs >55 with a good MS Add vasopressin today if pressor needed further Diuresis escalation , when off pressors Lactic acidosis improving Patient now in sinus rhythm Echo revealed decreased EF >> may need diuresis Troponin x 1 EKG today Will need risk stratification pre hosp dc-cards No Asa- plat 24 k    RENAL  A: AKI - secondary to hypovolemia and septic shock, ATN Hypomagnesemia - resolved Hyponatremia Anion gap metabolic acidosis (larger portion given albumin correction) and Non-AG metabolic acidosis (from prior diarrhea), r/o rta 4  P:  Renal US - mild L hydro --remains with lactic up = urology assessment - stent  left? BMP Q12 hours; Replete electrolytes as indicated No role bicarb  GASTROINTESTINAL  A: Diarrhea and evidence of colitis on CT scan, continue lactic acdosis P:  C. Dif - Neg Lactic acid trend LDH mildly elevated at 263 Continue Dysphagia 3 diet Consider repeat consult, Gi consult, ischemia? Scope?  HEMATOLOGIC  A: Thrombocytopenia - hx of pancytopenia, leukocytosis, dilutional affects to plat as well P:  Likely secondary to sepsis and underlying pancytopenia. Baseline Hb 11-12; Baseline PLT 90-100 No heparin, SCDs for DVT prophylaxis.  PLT count improved at 24 today. WOULD NOT TX PLAT unless bleeding or less 10 k  INFECTIOUS  A: Colitis, pyelonephritis, Gram neg reported from [facility] P:  No enteroccus, mrsa noted, dc vanc Continue Meropenem and de-escalate based on culture results and clinical improvement Call urology, see renal May need repeat CT vs scope Repeat BC to follow No veg on echo  ENDOCRINE  A: DM2, poor diet intake P:  CBG's Q4 SSI - increased to Resistant. CBG's increasing - may need Insulin drip  Hold lantus with variable intake  NEUROLOGIC  A: Delerium Improved P:  Secondary to acute illness Treatment as above  TODAY'S SUMMARY: Continuing Abx, Starting Vaso, urology call, Gi colono?, may need ct, remains in shock, lactic up  Everlene Other DO Family Medicine PGY-2  I have fully examined this patient and agree with above findings.    And edited infull  Ccm time 30 min  Mcarthur Rossetti. Tyson Alias, MD, FACP Pgr: [phone] [place] [facility]

## 2012-12-07 NOTE — Consult Note (Signed)
Urology Consult  Referring physician: Adriana Simas Reason for referral: septic shock  Chief Complaint: Septic shock  History of Present Illness: 78 year old woman presents with sepsis; questionable pyelo/UTI; renal ultrasound noted to have mild hydro of left and ? 7 mm stone upper pole; elevated WBC noted; Cr 2,24 mildly improved; hx of pancytopenia and medical comorbidities; SPOKE WITH DR Adriana Simas AND APPARENTLY SHE HAS HAD A CT SCAN AND HAS NO OBSTRUCTING STONE; patient improving but slowly; has another source of infection as well according to notes/Dr Adriana Simas;  History limited by patient ? Abdominal pain No previous stones/GU surger Modifying factors: There are no other modifying factors  Associated signs and symptoms: There are no other associated signs and symptoms Aggravating and relieving factors: There are no other aggravating or relieving factors Severity: Moderate to severe Duration: Persistent  Past Medical History  Diagnosis Date  . Hypothyroidism 11/01/1995  . Anxiety 11/01/1995  . Diabetes mellitus type II 05/25/1996    insulin-requiring  . Hyperlipemia 06/25/2002  . Atopic dermatitis   . History of MRI of brain and brain stem 07/10/2005    w amd w/o no mass/infarct small vess dz  . Dehydration     Hosp [facility], hyperglycemia and ketoacidosis (stopped insulin)  . Pancytopenia 06/24-7/05/2007    Tissue D/O  . History of gallstones 11/21/2007    Abd U/S multip gallstones contracted GB CBD ULN Tr hydronephr R  . Acute cholecystitis 12/1/-05/20/08    [facility], s/p lap choleycystectomy CTD DM w/hypoglycemic sz in ER  . Gallstones 05/11/08    abd U/S gallstones pericholeycystic fluid [facility]   Past Surgical History  Procedure Laterality Date  . Partial hysterectomy      OV intact  . Carpel tunn      right  . Trigger finger release      left  . Ganglionectomy    . Cholecystectomy      Medications: I have reviewed the patient's current medications. Allergies:  Allergies   Allergen Reactions  . Hydrocodone-Acetaminophen Other (See Comments)    dizziness  . Meloxicam     REACTION: VOMITING  . Penicillins Rash    Family History  Problem Relation Age of Onset  . Kidney disease Mother   . Diabetes Mother   . Hypertension Mother   . Diabetes Brother   . Stroke Brother   . Stroke Brother     CVA  . Diabetes Brother    Social History:  reports that she has never smoked. She has never used smokeless tobacco. She reports that she does not drink alcohol or use illicit drugs.  ROS: All systems are reviewed and negative except as noted. ewst neg  Physical Exam:  Vital signs in last 24 hours: Temp:  [97.6 F (36.4 C)-98.4 F (36.9 C)] 98.4 F (36.9 C) (07/16 0851) Pulse Rate:  [49-142] 76 (07/16 1010) Resp:  [10-24] 12 (07/16 1010) BP: (68-104)/(36-71) 104/48 mmHg (07/16 1010) SpO2:  [94 %-99 %] 96 % (07/16 1010) Arterial Line BP: (87-159)/(40-62) 127/49 mmHg (07/16 1010)  Cardiovascular: Skin warm; not flushed Respiratory: Breaths quiet; no shortness of breath Abdomen: No masses Neurological: Normal sensation to touch Musculoskeletal: Normal motor function arms and legs Lymphatics: No inguinal adenopathy Skin: No rashes Genitourinary:abdomen diffusely tender but no rebound  Laboratory Data:  Results for orders placed during the hospital encounter of 12/04/12 (from the past 72 hour(s))  MRSA PCR SCREENING     Status: Abnormal   Collection Time    12/04/12  8:56  PM      Result Value Range   MRSA by PCR POSITIVE (*) NEGATIVE   Comment:            The GeneXpert MRSA Assay (FDA     approved for NASAL specimens     only), is one component of a     comprehensive MRSA colonization     surveillance program. It is not     intended to diagnose MRSA     infection nor to guide or     monitor treatment for     MRSA infections.     RESULT CALLED TO, READ BACK BY AND VERIFIED WITH:     KEB,B RN [id number] 12/05/12 MITCHELL,L  URINALYSIS, ROUTINE W REFLEX  MICROSCOPIC     Status: Abnormal   Collection Time    12/04/12  9:01 PM      Result Value Range   Color, Urine YELLOW  YELLOW   APPearance CLOUDY (*) CLEAR   Specific Gravity, Urine 1.016  1.005 - 1.030   pH 5.5  5.0 - 8.0   Glucose, UA 100 (*) NEGATIVE mg/dL   Hgb urine dipstick LARGE (*) NEGATIVE   Bilirubin Urine SMALL (*) NEGATIVE   Ketones, ur 15 (*) NEGATIVE mg/dL   Protein, ur 30 (*) NEGATIVE mg/dL   Urobilinogen, UA 0.2  0.0 - 1.0 mg/dL   Nitrite NEGATIVE  NEGATIVE   Leukocytes, UA LARGE (*) NEGATIVE  URINE MICROSCOPIC-ADD ON     Status: Abnormal   Collection Time    12/04/12  9:01 PM      Result Value Range   Squamous Epithelial / LPF FEW (*) RARE   WBC, UA 11-20  <3 WBC/hpf   RBC / HPF 7-10  <3 RBC/hpf   Bacteria, UA MANY (*) RARE  URINE CULTURE     Status: None   Collection Time    12/04/12  9:01 PM      Result Value Range   Specimen Description URINE, CATHETERIZED     Special Requests ADDED 2135     Culture  Setup Time 12/05/2012 02:48     Colony Count 60,000 COLONIES/ML     Culture ESCHERICHIA COLI     Report Status PENDING    CORTISOL     Status: None   Collection Time    12/04/12  9:30 PM      Result Value Range   Cortisol, Plasma 32.1     Comment: (NOTE)     AM:  4.3 - 22.4 ug/dL     PM:  3.1 - 29.5 ug/dL  COMPREHENSIVE METABOLIC PANEL     Status: Abnormal   Collection Time    12/04/12  9:56 PM      Result Value Range   Sodium 125 (*) 135 - 145 mEq/L   Potassium 5.0  3.5 - 5.1 mEq/L   Chloride 99  96 - 112 mEq/L   CO2 10 (*) 19 - 32 mEq/L   Comment: CRITICAL RESULT CALLED TO, READ BACK BY AND VERIFIED WITH:     CHRIS HAYES RN [phone] GREEN R   Glucose, Bld 344 (*) 70 - 99 mg/dL   BUN 71 (*) 6 - 23 mg/dL   Creatinine, Ser 6.57 (*) 0.50 - 1.10 mg/dL   Calcium 7.5 (*) 8.4 - 10.5 mg/dL   Total Protein 5.7 (*) 6.0 - 8.3 g/dL   Albumin 2.1 (*) 3.5 - 5.2 g/dL   AST 56 (*) 0 - 37 U/L  ALT 19  0 - 35 U/L   Alkaline Phosphatase 108  39 - 117 U/L    Total Bilirubin 2.7 (*) 0.3 - 1.2 mg/dL   GFR calc non Af Amer 16 (*) >90 mL/min   GFR calc Af Amer 19 (*) >90 mL/min   Comment:            The eGFR has been calculated     using the CKD EPI equation.     This calculation has not been     validated in all clinical     situations.     eGFR's persistently     <90 mL/min signify     possible Chronic Kidney Disease.  LACTIC ACID, PLASMA     Status: Abnormal   Collection Time    12/04/12  9:56 PM      Result Value Range   Lactic Acid, Venous 2.5 (*) 0.5 - 2.2 mmol/L  MAGNESIUM     Status: None   Collection Time    12/04/12  9:56 PM      Result Value Range   Magnesium 1.7  1.5 - 2.5 mg/dL  LIPASE, BLOOD     Status: None   Collection Time    12/04/12  9:56 PM      Result Value Range   Lipase 12  11 - 59 U/L  CBC WITH DIFFERENTIAL     Status: Abnormal   Collection Time    12/04/12  9:56 PM      Result Value Range   WBC 20.5 (*) 4.0 - 10.5 K/uL   RBC 3.09 (*) 3.87 - 5.11 MIL/uL   Hemoglobin 9.6 (*) 12.0 - 15.0 g/dL   HCT 16.1 (*) 09.6 - 04.5 %   MCV 86.1  78.0 - 100.0 fL   MCH 31.1  26.0 - 34.0 pg   MCHC 36.1 (*) 30.0 - 36.0 g/dL   RDW 40.9  81.1 - 91.4 %   Platelets 43 (*) 150 - 400 K/uL   Comment: PLATELET COUNT CONFIRMED BY SMEAR   Neutrophils Relative % 92 (*) 43 - 77 %   Lymphocytes Relative 2 (*) 12 - 46 %   Monocytes Relative 6  3 - 12 %   Eosinophils Relative 0  0 - 5 %   Basophils Relative 0  0 - 1 %   Neutro Abs 18.9 (*) 1.7 - 7.7 K/uL   Lymphs Abs 0.4 (*) 0.7 - 4.0 K/uL   Monocytes Absolute 1.2 (*) 0.1 - 1.0 K/uL   Eosinophils Absolute 0.0  0.0 - 0.7 K/uL   Basophils Absolute 0.0  0.0 - 0.1 K/uL   RBC Morphology ELLIPTOCYTES     Comment: BURR CELLS   WBC Morphology TOXIC GRANULATION     Comment: MILD LEFT SHIFT (1-5% METAS, OCC MYELO, OCC BANDS)  APTT     Status: None   Collection Time    12/04/12  9:56 PM      Result Value Range   aPTT 33  24 - 37 seconds  D-DIMER, QUANTITATIVE     Status: Abnormal    Collection Time    12/04/12  9:56 PM      Result Value Range   D-Dimer, Quant 10.55 (*) 0.00 - 0.48 ug/mL-FEU   Comment:            AT THE INHOUSE ESTABLISHED CUTOFF     VALUE OF 0.48 ug/mL FEU,     THIS ASSAY HAS BEEN DOCUMENTED  IN THE LITERATURE TO HAVE     A SENSITIVITY AND NEGATIVE     PREDICTIVE VALUE OF AT LEAST     98 TO 99%.  THE TEST RESULT     SHOULD BE CORRELATED WITH     AN ASSESSMENT OF THE CLINICAL     PROBABILITY OF DVT / VTE.  CULTURE, BLOOD (ROUTINE X 2)     Status: None   Collection Time    12/04/12 10:00 PM      Result Value Range   Specimen Description BLOOD LEFT HAND     Special Requests BOTTLES DRAWN AEROBIC ONLY 6CC     Culture  Setup Time 12/05/2012 02:48     Culture       Value:        BLOOD CULTURE RECEIVED NO GROWTH TO DATE CULTURE WILL BE HELD FOR 5 DAYS BEFORE ISSUING A FINAL NEGATIVE REPORT   Report Status PENDING    CULTURE, BLOOD (ROUTINE X 2)     Status: None   Collection Time    12/04/12 10:03 PM      Result Value Range   Specimen Description BLOOD RIGHT HAND     Special Requests BOTTLES DRAWN AEROBIC AND ANAEROBIC 10CC EA     Culture  Setup Time 12/05/2012 02:48     Culture       Value:        BLOOD CULTURE RECEIVED NO GROWTH TO DATE CULTURE WILL BE HELD FOR 5 DAYS BEFORE ISSUING A FINAL NEGATIVE REPORT   Report Status PENDING    CLOSTRIDIUM DIFFICILE BY PCR     Status: None   Collection Time    12/04/12 10:35 PM      Result Value Range   C difficile by pcr NEGATIVE  NEGATIVE  GLUCOSE, CAPILLARY     Status: Abnormal   Collection Time    12/04/12 11:43 PM      Result Value Range   Glucose-Capillary 322 (*) 70 - 99 mg/dL   Comment 1 Notify RN    GLUCOSE, CAPILLARY     Status: Abnormal   Collection Time    12/05/12  3:56 AM      Result Value Range   Glucose-Capillary 195 (*) 70 - 99 mg/dL   Comment 1 Notify RN    CBC     Status: Abnormal   Collection Time    12/05/12  4:44 AM      Result Value Range   WBC 8.1  4.0 - 10.5 K/uL    RBC 3.21 (*) 3.87 - 5.11 MIL/uL   Hemoglobin 10.0 (*) 12.0 - 15.0 g/dL   HCT 54.0 (*) 98.1 - 19.1 %   MCV 85.4  78.0 - 100.0 fL   MCH 31.2  26.0 - 34.0 pg   MCHC 36.5 (*) 30.0 - 36.0 g/dL   RDW 47.8  29.5 - 62.1 %   Platelets 34 (*) 150 - 400 K/uL   Comment: CONSISTENT WITH PREVIOUS RESULT  BASIC METABOLIC PANEL     Status: Abnormal   Collection Time    12/05/12  4:44 AM      Result Value Range   Sodium 129 (*) 135 - 145 mEq/L   Potassium 3.9  3.5 - 5.1 mEq/L   Comment: DELTA CHECK NOTED   Chloride 102  96 - 112 mEq/L   CO2 10 (*) 19 - 32 mEq/L   Comment: CRITICAL RESULT CALLED TO, READ BACK BY AND VERIFIED WITH:  KEBU B,RN 12/05/12 0533 WAYK   Glucose, Bld 207 (*) 70 - 99 mg/dL   BUN 75 (*) 6 - 23 mg/dL   Creatinine, Ser 1.61 (*) 0.50 - 1.10 mg/dL   Calcium 7.9 (*) 8.4 - 10.5 mg/dL   GFR calc non Af Amer 15 (*) >90 mL/min   GFR calc Af Amer 17 (*) >90 mL/min   Comment:            The eGFR has been calculated     using the CKD EPI equation.     This calculation has not been     validated in all clinical     situations.     eGFR's persistently     <90 mL/min signify     possible Chronic Kidney Disease.  MAGNESIUM     Status: None   Collection Time    12/05/12  4:44 AM      Result Value Range   Magnesium 1.6  1.5 - 2.5 mg/dL  VANCOMYCIN, RANDOM     Status: None   Collection Time    12/05/12  4:44 AM      Result Value Range   Vancomycin Rm 8.8     Comment:            Random Vancomycin therapeutic     range is dependent on dosage and     time of specimen collection.     A peak range is 20.0-40.0 ug/mL     A trough range is 5.0-15.0 ug/mL             GLUCOSE, CAPILLARY     Status: Abnormal   Collection Time    12/05/12  8:14 AM      Result Value Range   Glucose-Capillary 224 (*) 70 - 99 mg/dL  TSH     Status: None   Collection Time    12/05/12 11:50 AM      Result Value Range   TSH 1.127  0.350 - 4.500 uIU/mL  LACTIC ACID, PLASMA     Status: Abnormal    Collection Time    12/05/12 11:50 AM      Result Value Range   Lactic Acid, Venous 4.0 (*) 0.5 - 2.2 mmol/L  BASIC METABOLIC PANEL     Status: Abnormal   Collection Time    12/05/12 11:50 AM      Result Value Range   Sodium 127 (*) 135 - 145 mEq/L   Potassium 3.9  3.5 - 5.1 mEq/L   Chloride 101  96 - 112 mEq/L   CO2 8 (*) 19 - 32 mEq/L   Comment: CRITICAL RESULT CALLED TO, READ BACK BY AND VERIFIED WITH:     R.FOUNTAIN,RN 1246 12/05/12 CLARK,S   Glucose, Bld 237 (*) 70 - 99 mg/dL   BUN 76 (*) 6 - 23 mg/dL   Creatinine, Ser 0.96 (*) 0.50 - 1.10 mg/dL   Calcium 7.8 (*) 8.4 - 10.5 mg/dL   GFR calc non Af Amer 15 (*) >90 mL/min   GFR calc Af Amer 17 (*) >90 mL/min   Comment:            The eGFR has been calculated     using the CKD EPI equation.     This calculation has not been     validated in all clinical     situations.     eGFR's persistently     <90 mL/min signify     possible Chronic Kidney Disease.  PROTIME-INR     Status: Abnormal   Collection Time    12/05/12 11:50 AM      Result Value Range   Prothrombin Time 16.4 (*) 11.6 - 15.2 seconds   INR 1.36  0.00 - 1.49  POCT I-STAT 3, BLOOD GAS (G3+)     Status: Abnormal   Collection Time    12/05/12 11:59 AM      Result Value Range   pH, Arterial 7.260 (*) 7.350 - 7.450   pCO2 arterial 17.1 (*) 35.0 - 45.0 mmHg   pO2, Arterial 84.0  80.0 - 100.0 mmHg   Bicarbonate 7.7 (*) 20.0 - 24.0 mEq/L   TCO2 8  0 - 100 mmol/L   O2 Saturation 95.0     Acid-base deficit 17.0 (*) 0.0 - 2.0 mmol/L   Patient temperature 98.9 F     Collection site RADIAL, ALLEN'S TEST ACCEPTABLE     Drawn by Nurse     Sample type ARTERIAL     Comment NOTIFIED PHYSICIAN    POCT I-STAT 3, BLOOD GAS (G3+)     Status: Abnormal   Collection Time    12/05/12 12:14 PM      Result Value Range   pH, Arterial 7.263 (*) 7.350 - 7.450   pCO2 arterial 16.8 (*) 35.0 - 45.0 mmHg   pO2, Arterial 83.0  80.0 - 100.0 mmHg   Bicarbonate 7.6 (*) 20.0 - 24.0 mEq/L    TCO2 8  0 - 100 mmol/L   O2 Saturation 95.0     Acid-base deficit 17.0 (*) 0.0 - 2.0 mmol/L   Patient temperature 98.7 F     Collection site ARTERIAL LINE     Drawn by Operator     Sample type ARTERIAL     Comment MD NOTIFIED, SUGGEST RECOLLECT    GLUCOSE, CAPILLARY     Status: Abnormal   Collection Time    12/05/12 12:28 PM      Result Value Range   Glucose-Capillary 231 (*) 70 - 99 mg/dL  GLUCOSE, CAPILLARY     Status: Abnormal   Collection Time    12/05/12  4:27 PM      Result Value Range   Glucose-Capillary 285 (*) 70 - 99 mg/dL  POCT I-STAT 3, BLOOD GAS (G3+)     Status: Abnormal   Collection Time    12/05/12  5:42 PM      Result Value Range   pH, Arterial 7.291 (*) 7.350 - 7.450   pCO2 arterial 19.3 (*) 35.0 - 45.0 mmHg   pO2, Arterial 80.0  80.0 - 100.0 mmHg   Bicarbonate 9.3 (*) 20.0 - 24.0 mEq/L   TCO2 10  0 - 100 mmol/L   O2 Saturation 95.0     Acid-base deficit 15.0 (*) 0.0 - 2.0 mmol/L   Patient temperature 98.5 F     Sample type ARTERIAL    GLUCOSE, CAPILLARY     Status: Abnormal   Collection Time    12/05/12  8:31 PM      Result Value Range   Glucose-Capillary 276 (*) 70 - 99 mg/dL  BASIC METABOLIC PANEL     Status: Abnormal   Collection Time    12/05/12 11:32 PM      Result Value Range   Sodium 131 (*) 135 - 145 mEq/L   Potassium 3.3 (*) 3.5 - 5.1 mEq/L   Chloride 104  96 - 112 mEq/L   CO2 14 (*) 19 - 32 mEq/L   Glucose,  Bld 279 (*) 70 - 99 mg/dL   BUN 76 (*) 6 - 23 mg/dL   Creatinine, Ser 4.09 (*) 0.50 - 1.10 mg/dL   Calcium 7.5 (*) 8.4 - 10.5 mg/dL   GFR calc non Af Amer 18 (*) >90 mL/min   GFR calc Af Amer 20 (*) >90 mL/min   Comment:            The eGFR has been calculated     using the CKD EPI equation.     This calculation has not been     validated in all clinical     situations.     eGFR's persistently     <90 mL/min signify     possible Chronic Kidney Disease.  GLUCOSE, CAPILLARY     Status: Abnormal   Collection Time    12/06/12  12:46 AM      Result Value Range   Glucose-Capillary 230 (*) 70 - 99 mg/dL   Comment 1 Documented in Chart     Comment 2 Notify RN    MAGNESIUM     Status: None   Collection Time    12/06/12  3:00 AM      Result Value Range   Magnesium 2.1  1.5 - 2.5 mg/dL  CBC     Status: Abnormal   Collection Time    12/06/12  3:00 AM      Result Value Range   WBC 23.6 (*) 4.0 - 10.5 K/uL   RBC 3.14 (*) 3.87 - 5.11 MIL/uL   Hemoglobin 9.7 (*) 12.0 - 15.0 g/dL   HCT 81.1 (*) 91.4 - 78.2 %   MCV 84.1  78.0 - 100.0 fL   MCH 30.9  26.0 - 34.0 pg   MCHC 36.7 (*) 30.0 - 36.0 g/dL   RDW 95.6  21.3 - 08.6 %   Platelets 12 (*) 150 - 400 K/uL   Comment: PLATELET COUNT CONFIRMED BY SMEAR     CRITICAL RESULT CALLED TO, READ BACK BY AND VERIFIED WITHJuanda Bond RN [id number] [phone] GREEN R  BASIC METABOLIC PANEL     Status: Abnormal   Collection Time    12/06/12  3:00 AM      Result Value Range   Sodium 130 (*) 135 - 145 mEq/L   Potassium 3.5  3.5 - 5.1 mEq/L   Chloride 103  96 - 112 mEq/L   CO2 11 (*) 19 - 32 mEq/L   Glucose, Bld 160 (*) 70 - 99 mg/dL   BUN 74 (*) 6 - 23 mg/dL   Creatinine, Ser 2.84 (*) 0.50 - 1.10 mg/dL   Calcium 7.3 (*) 8.4 - 10.5 mg/dL   GFR calc non Af Amer 18 (*) >90 mL/min   GFR calc Af Amer 21 (*) >90 mL/min   Comment:            The eGFR has been calculated     using the CKD EPI equation.     This calculation has not been     validated in all clinical     situations.     eGFR's persistently     <90 mL/min signify     possible Chronic Kidney Disease.  ABO/RH     Status: None   Collection Time    12/06/12  3:00 AM      Result Value Range   ABO/RH(D) O POS    GLUCOSE, CAPILLARY     Status: Abnormal   Collection Time  12/06/12  4:07 AM      Result Value Range   Glucose-Capillary 164 (*) 70 - 99 mg/dL   Comment 1 Documented in Chart     Comment 2 Notify RN    PREPARE PLATELET PHERESIS     Status: None   Collection Time    12/06/12  4:50 AM      Result Value  Range   Unit Number [id number]     Blood Component Type PLTPHER LR1     Unit division 00     Status of Unit ISSUED     Transfusion Status OK TO TRANSFUSE    POCT I-STAT 3, BLOOD GAS (G3+)     Status: Abnormal   Collection Time    12/06/12  7:50 AM      Result Value Range   pH, Arterial 7.382  7.350 - 7.450   pCO2 arterial 23.2 (*) 35.0 - 45.0 mmHg   pO2, Arterial 72.0 (*) 80.0 - 100.0 mmHg   Bicarbonate 13.8 (*) 20.0 - 24.0 mEq/L   TCO2 14  0 - 100 mmol/L   O2 Saturation 95.0     Acid-base deficit 10.0 (*) 0.0 - 2.0 mmol/L   Sample type ARTERIAL    LACTIC ACID, PLASMA     Status: Abnormal   Collection Time    12/06/12  8:00 AM      Result Value Range   Lactic Acid, Venous 2.6 (*) 0.5 - 2.2 mmol/L  GLUCOSE, CAPILLARY     Status: Abnormal   Collection Time    12/06/12  8:18 AM      Result Value Range   Glucose-Capillary 68 (*) 70 - 99 mg/dL  URINALYSIS, ROUTINE W REFLEX MICROSCOPIC     Status: Abnormal   Collection Time    12/06/12 10:53 AM      Result Value Range   Color, Urine YELLOW  YELLOW   APPearance CLEAR  CLEAR   Specific Gravity, Urine 1.020  1.005 - 1.030   pH 5.5  5.0 - 8.0   Glucose, UA NEGATIVE  NEGATIVE mg/dL   Hgb urine dipstick NEGATIVE  NEGATIVE   Bilirubin Urine MODERATE (*) NEGATIVE   Ketones, ur 15 (*) NEGATIVE mg/dL   Protein, ur 30 (*) NEGATIVE mg/dL   Urobilinogen, UA 1.0  0.0 - 1.0 mg/dL   Nitrite NEGATIVE  NEGATIVE   Leukocytes, UA SMALL (*) NEGATIVE  URINE MICROSCOPIC-ADD ON     Status: Abnormal   Collection Time    12/06/12 10:53 AM      Result Value Range   Squamous Epithelial / LPF RARE  RARE   WBC, UA 3-6  <3 WBC/hpf   RBC / HPF 0-2  <3 RBC/hpf   Bacteria, UA FEW (*) RARE   Casts HYALINE CASTS (*) NEGATIVE  BASIC METABOLIC PANEL     Status: Abnormal   Collection Time    12/06/12 11:20 AM      Result Value Range   Sodium 133 (*) 135 - 145 mEq/L   Potassium 3.7  3.5 - 5.1 mEq/L   Chloride 105  96 - 112 mEq/L   CO2 17 (*) 19 - 32  mEq/L   Glucose, Bld 126 (*) 70 - 99 mg/dL   BUN 77 (*) 6 - 23 mg/dL   Creatinine, Ser 2.95 (*) 0.50 - 1.10 mg/dL   Calcium 7.9 (*) 8.4 - 10.5 mg/dL   GFR calc non Af Amer 20 (*) >90 mL/min   GFR calc Af Amer 23 (*) >  90 mL/min   Comment:            The eGFR has been calculated     using the CKD EPI equation.     This calculation has not been     validated in all clinical     situations.     eGFR's persistently     <90 mL/min signify     possible Chronic Kidney Disease.  CULTURE, BLOOD (ROUTINE X 2)     Status: None   Collection Time    12/06/12 11:20 AM      Result Value Range   Specimen Description BLOOD LEFT HAND     Special Requests BOTTLES DRAWN AEROBIC ONLY 10CC     Culture  Setup Time 12/06/2012 16:28     Culture       Value:        BLOOD CULTURE RECEIVED NO GROWTH TO DATE CULTURE WILL BE HELD FOR 5 DAYS BEFORE ISSUING A FINAL NEGATIVE REPORT   Report Status PENDING    LACTATE DEHYDROGENASE     Status: Abnormal   Collection Time    12/06/12 11:20 AM      Result Value Range   LDH 263 (*) 94 - 250 U/L  CULTURE, BLOOD (ROUTINE X 2)     Status: None   Collection Time    12/06/12 11:25 AM      Result Value Range   Specimen Description BLOOD LEFT HAND     Special Requests BOTTLES DRAWN AEROBIC ONLY 5CC     Culture  Setup Time 12/06/2012 16:28     Culture       Value:        BLOOD CULTURE RECEIVED NO GROWTH TO DATE CULTURE WILL BE HELD FOR 5 DAYS BEFORE ISSUING A FINAL NEGATIVE REPORT   Report Status PENDING    GLUCOSE, CAPILLARY     Status: Abnormal   Collection Time    12/06/12 12:22 PM      Result Value Range   Glucose-Capillary 144 (*) 70 - 99 mg/dL  GLUCOSE, CAPILLARY     Status: Abnormal   Collection Time    12/06/12  4:21 PM      Result Value Range   Glucose-Capillary 182 (*) 70 - 99 mg/dL  GLUCOSE, CAPILLARY     Status: Abnormal   Collection Time    12/06/12  7:28 PM      Result Value Range   Glucose-Capillary 189 (*) 70 - 99 mg/dL  BASIC METABOLIC PANEL      Status: Abnormal   Collection Time    12/06/12 11:36 PM      Result Value Range   Sodium 131 (*) 135 - 145 mEq/L   Potassium 3.9  3.5 - 5.1 mEq/L   Chloride 104  96 - 112 mEq/L   CO2 17 (*) 19 - 32 mEq/L   Glucose, Bld 240 (*) 70 - 99 mg/dL   BUN 81 (*) 6 - 23 mg/dL   Creatinine, Ser 8.29 (*) 0.50 - 1.10 mg/dL   Calcium 7.4 (*) 8.4 - 10.5 mg/dL   GFR calc non Af Amer 21 (*) >90 mL/min   GFR calc Af Amer 24 (*) >90 mL/min   Comment:            The eGFR has been calculated     using the CKD EPI equation.     This calculation has not been     validated in all clinical  situations.     eGFR's persistently     <90 mL/min signify     possible Chronic Kidney Disease.  GLUCOSE, CAPILLARY     Status: Abnormal   Collection Time    12/06/12 11:40 PM      Result Value Range   Glucose-Capillary 225 (*) 70 - 99 mg/dL   Comment 1 Documented in Chart     Comment 2 Notify RN    GLUCOSE, CAPILLARY     Status: Abnormal   Collection Time    12/07/12  3:50 AM      Result Value Range   Glucose-Capillary 285 (*) 70 - 99 mg/dL   Comment 1 Documented in Chart     Comment 2 Notify RN    CBC     Status: Abnormal   Collection Time    12/07/12  4:12 AM      Result Value Range   WBC 26.8 (*) 4.0 - 10.5 K/uL   RBC 3.26 (*) 3.87 - 5.11 MIL/uL   Hemoglobin 10.1 (*) 12.0 - 15.0 g/dL   HCT 16.1 (*) 09.6 - 04.5 %   MCV 84.4  78.0 - 100.0 fL   MCH 31.0  26.0 - 34.0 pg   MCHC 36.7 (*) 30.0 - 36.0 g/dL   RDW 40.9  81.1 - 91.4 %   Platelets 24 (*) 150 - 400 K/uL   Comment: POST TRANSFUSION SPECIMEN     CRITICAL VALUE NOTED.  VALUE IS CONSISTENT WITH PREVIOUSLY REPORTED AND CALLED VALUE.  COMPREHENSIVE METABOLIC PANEL     Status: Abnormal   Collection Time    12/07/12  4:12 AM      Result Value Range   Sodium 130 (*) 135 - 145 mEq/L   Potassium 3.9  3.5 - 5.1 mEq/L   Chloride 101  96 - 112 mEq/L   CO2 16 (*) 19 - 32 mEq/L   Glucose, Bld 319 (*) 70 - 99 mg/dL   BUN 88 (*) 6 - 23 mg/dL    Creatinine, Ser 7.82 (*) 0.50 - 1.10 mg/dL   Calcium 7.4 (*) 8.4 - 10.5 mg/dL   Total Protein 4.9 (*) 6.0 - 8.3 g/dL   Albumin 1.8 (*) 3.5 - 5.2 g/dL   AST 60 (*) 0 - 37 U/L   ALT 25  0 - 35 U/L   Alkaline Phosphatase 166 (*) 39 - 117 U/L   Total Bilirubin 2.7 (*) 0.3 - 1.2 mg/dL   GFR calc non Af Amer 20 (*) >90 mL/min   GFR calc Af Amer 23 (*) >90 mL/min   Comment:            The eGFR has been calculated     using the CKD EPI equation.     This calculation has not been     validated in all clinical     situations.     eGFR's persistently     <90 mL/min signify     possible Chronic Kidney Disease.  LACTIC ACID, PLASMA     Status: Abnormal   Collection Time    12/07/12  7:34 AM      Result Value Range   Lactic Acid, Venous 3.2 (*) 0.5 - 2.2 mmol/L  GLUCOSE, CAPILLARY     Status: Abnormal   Collection Time    12/07/12  8:04 AM      Result Value Range   Glucose-Capillary 383 (*) 70 - 99 mg/dL   Recent Results (from the past 240 hour(s))  MRSA PCR SCREENING     Status: Abnormal   Collection Time    12/04/12  8:56 PM      Result Value Range Status   MRSA by PCR POSITIVE (*) NEGATIVE Final   Comment:            The GeneXpert MRSA Assay (FDA     approved for NASAL specimens     only), is one component of a     comprehensive MRSA colonization     surveillance program. It is not     intended to diagnose MRSA     infection nor to guide or     monitor treatment for     MRSA infections.     RESULT CALLED TO, READ BACK BY AND VERIFIED WITH:     KEB,B RN [id number] 12/05/12 MITCHELL,L  URINE CULTURE     Status: None   Collection Time    12/04/12  9:01 PM      Result Value Range Status   Specimen Description URINE, CATHETERIZED   Final   Special Requests ADDED 2135   Final   Culture  Setup Time 12/05/2012 02:48   Final   Colony Count 60,000 COLONIES/ML   Final   Culture ESCHERICHIA COLI   Final   Report Status PENDING   Incomplete  CULTURE, BLOOD (ROUTINE X 2)     Status: None    Collection Time    12/04/12 10:00 PM      Result Value Range Status   Specimen Description BLOOD LEFT HAND   Final   Special Requests BOTTLES DRAWN AEROBIC ONLY 6CC   Final   Culture  Setup Time 12/05/2012 02:48   Final   Culture     Final   Value:        BLOOD CULTURE RECEIVED NO GROWTH TO DATE CULTURE WILL BE HELD FOR 5 DAYS BEFORE ISSUING A FINAL NEGATIVE REPORT   Report Status PENDING   Incomplete  CULTURE, BLOOD (ROUTINE X 2)     Status: None   Collection Time    12/04/12 10:03 PM      Result Value Range Status   Specimen Description BLOOD RIGHT HAND   Final   Special Requests BOTTLES DRAWN AEROBIC AND ANAEROBIC 10CC EA   Final   Culture  Setup Time 12/05/2012 02:48   Final   Culture     Final   Value:        BLOOD CULTURE RECEIVED NO GROWTH TO DATE CULTURE WILL BE HELD FOR 5 DAYS BEFORE ISSUING A FINAL NEGATIVE REPORT   Report Status PENDING   Incomplete  CLOSTRIDIUM DIFFICILE BY PCR     Status: None   Collection Time    12/04/12 10:35 PM      Result Value Range Status   C difficile by pcr NEGATIVE  NEGATIVE Final  CULTURE, BLOOD (ROUTINE X 2)     Status: None   Collection Time    12/06/12 11:20 AM      Result Value Range Status   Specimen Description BLOOD LEFT HAND   Final   Special Requests BOTTLES DRAWN AEROBIC ONLY 10CC   Final   Culture  Setup Time 12/06/2012 16:28   Final   Culture     Final   Value:        BLOOD CULTURE RECEIVED NO GROWTH TO DATE CULTURE WILL BE HELD FOR 5 DAYS BEFORE ISSUING A FINAL NEGATIVE REPORT   Report Status PENDING   Incomplete  CULTURE, BLOOD (ROUTINE X 2)     Status: None   Collection Time    12/06/12 11:25 AM      Result Value Range Status   Specimen Description BLOOD LEFT HAND   Final   Special Requests BOTTLES DRAWN AEROBIC ONLY 5CC   Final   Culture  Setup Time 12/06/2012 16:28   Final   Culture     Final   Value:        BLOOD CULTURE RECEIVED NO GROWTH TO DATE CULTURE WILL BE HELD FOR 5 DAYS BEFORE ISSUING A FINAL NEGATIVE REPORT    Report Status PENDING   Incomplete   Creatinine:  Recent Labs  12/05/12 0444 12/05/12 1150 12/05/12 2332 12/06/12 0300 12/06/12 1120 12/06/12 2336 12/07/12 0412  CREATININE 2.89* 2.91* 2.51* 2.42* 2.30* 2.17* 2.24*    Xrays: See report/chart i agree that there is mild hydro on the left and possible upper pole non-obst stone; rt kidney normal i reviewed the ct scan that demonstrated air in bladder/ureter/left collecting system and mild hydro; no distal obstructing mass or stone; periureteral stranding  Impression/Assessment:  Pyelonephritis and ureteritis with air; no obstruction to stent  Plan:  Currently do not recommend stent Treat for pyelo Please let me know if things change  Steffanie Mingle A 12/07/2012, 10:37 AM

## 2012-12-07 NOTE — Consult Note (Addendum)
Referring Provider: No ref. provider found Primary Care Physician:  Eustaquio Boyden, MD Primary Gastroenterologist:  None, unassigned  Reason for Consultation:  Colitis  HPI: Gina Andersen is a 78 y.o. female with past medical history significant for DM, hypothyroidism, and pancytopenia who presented to [facility] on 7/13 with 3 day history of diarrhea and lethargy.  She was found to be hypotensive and acidotic with several electrolyte abnormalities and AKI.  A urinalysis was consistent with UTI and CT abdominal consistent with pyelonephritis and possible mild colitis in the descending colon. She was given a dose of vancomycin and flagyl.  A central line was placed in the femoral vein and she was started on levophed after she remained hypotensive after 2-3L fluids. She was transferred to [facility] ICU for further care.  She remains hypotensive, requiring pressors.  She is on meropenum.  Cdiff was negative.  Patient is alert, but cannot offer any information.  Just moans and mumbles.  No evidence of GI history regarding procedures, etc in EPIC chart.  Has leukocytosis of 26.8, platelets 24, lactic acid 3.2.    Past Medical History  Diagnosis Date  . Hypothyroidism 11/01/1995  . Anxiety 11/01/1995  . Diabetes mellitus type II 05/25/1996    insulin-requiring  . Hyperlipemia 06/25/2002  . Atopic dermatitis   . History of MRI of brain and brain stem 07/10/2005    w amd w/o no mass/infarct small vess dz  . Dehydration     Hosp [facility], hyperglycemia and ketoacidosis (stopped insulin)  . Pancytopenia 06/24-7/05/2007    Tissue D/O  . History of gallstones 11/21/2007    Abd U/S multip gallstones contracted GB CBD ULN Tr hydronephr R  . Acute cholecystitis 12/1/-05/20/08    [facility], s/p lap choleycystectomy CTD DM w/hypoglycemic sz in ER  . Gallstones 05/11/08    abd U/S gallstones pericholeycystic fluid [facility]    Past Surgical History  Procedure Laterality Date  . Partial  hysterectomy      OV intact  . Carpel tunn      right  . Trigger finger release      left  . Ganglionectomy    . Cholecystectomy      Prior to Admission medications   Medication Sig Start Date End Date Taking? Authorizing Provider  esomeprazole (NEXIUM) 40 MG capsule Take 1 capsule (40 mg total) by mouth daily before breakfast. 07/30/11  Yes Joaquim Nam, MD  insulin aspart (NOVOLOG) 100 UNIT/ML injection Inject 6 Units into the skin 3 (three) times daily before meals.   Yes Historical Provider, MD  insulin glargine (LANTUS) 100 UNIT/ML injection Inject 15 Units into the skin daily.   Yes Historical Provider, MD  levothyroxine (SYNTHROID, LEVOTHROID) 50 MCG tablet Take 50 mcg by mouth daily before breakfast.   Yes Historical Provider, MD  lisinopril (PRINIVIL,ZESTRIL) 5 MG tablet Take 5 mg by mouth daily.   Yes Historical Provider, MD  LORazepam (ATIVAN) 1 MG tablet Take 1 mg by mouth at bedtime as needed for anxiety.   Yes Historical Provider, MD  metFORMIN (GLUCOPHAGE) 500 MG tablet Take 1,000 mg by mouth 2 (two) times daily with a meal.   Yes Historical Provider, MD  potassium chloride SA (KLOR-CON M20) 20 MEQ tablet Take 1 tablet (20 mEq total) by mouth 2 (two) times daily. 02/26/11  Yes Arta Silence, MD  pregabalin (LYRICA) 25 MG capsule Take 25 mg by mouth 2 (two) times daily.   Yes Historical Provider, MD  travoprost, benzalkonium, (  TRAVATAN) 0.004 % ophthalmic solution Place 1 drop into both eyes at bedtime.    Yes Historical Provider, MD  glucose blood (FREESTYLE LITE) test strip Test blood sugar 4 times a day 10/08/10   Arta Silence, MD  glucose blood test strip Check blood sugar 3 times daily 10/16/10   Arta Silence, MD  Insulin Syringe-Needle U-100 (BD INSULIN SYRINGE ULTRAFINE) 31G X 5/16" 0.5 ML MISC Use as directed per sliding scale 06/17/11   Arta Silence, MD  Lancets (FREESTYLE) lancets Test blood sugar 3 times a day 10/08/10   Arta Silence, MD     Current Facility-Administered Medications  Medication Dose Route Frequency Provider Last Rate Last Dose  . 0.9 %  sodium chloride infusion   Intravenous Continuous Tommie Sams, DO 10 mL/hr at 12/06/12 1227 1,000 mL at 12/06/12 1227  . Chlorhexidine Gluconate Cloth 2 % PADS 6 each  6 each Topical Q0600 Lonia Farber, MD   6 each at 12/06/12 0600  . fentaNYL (SUBLIMAZE) injection 25-50 mcg  25-50 mcg Intravenous Q2H PRN Tommie Sams, DO   50 mcg at 12/07/12 0155  . furosemide (LASIX) injection 20 mg  20 mg Intravenous Q6H Jayce G Cook, DO   20 mg at 12/07/12 0842  . insulin aspart (novoLOG) injection 0-20 Units  0-20 Units Subcutaneous TID WC Tommie Sams, DO   20 Units at 12/07/12 0841  . levothyroxine (SYNTHROID, LEVOTHROID) tablet 88 mcg  88 mcg Oral QAC breakfast Carolan Clines, MD   88 mcg at 12/07/12 [id number]  . meropenem (MERREM) 500 mg in sodium chloride 0.9 % 50 mL IVPB  500 mg Intravenous Q12H Gwenlyn Found Carney, RPH   500 mg at 12/07/12 1008  . mupirocin ointment (BACTROBAN) 2 % 1 application  1 application Nasal BID Lonia Farber, MD   1 application at 12/07/12 1008  . ondansetron (ZOFRAN) 8 mg/NS 50 ml IVPB  8 mg Intravenous Q6H PRN Tommie Sams, DO      . ondansetron (ZOFRAN) tablet 8 mg  8 mg Oral Q6H PRN Tommie Sams, DO   8 mg at 12/06/12 1720  . pantoprazole (PROTONIX) injection 40 mg  40 mg Intravenous QHS Carolan Clines, MD   40 mg at 12/06/12 2200  . phenylephrine (NEO-SYNEPHRINE) 40,000 mcg in dextrose 5 % 250 mL infusion  30-200 mcg/min Intravenous Titrated Lonia Farber, MD 2.6 mL/hr at 12/07/12 1000 7 mcg/min at 12/07/12 1000  . sertraline (ZOLOFT) tablet 50 mg  50 mg Oral Daily Carolan Clines, MD   50 mg at 12/07/12 1008  . Travoprost (BAK Free) (TRAVATAN) 0.004 % ophthalmic solution SOLN 1 drop  1 drop Both Eyes QHS Carolan Clines, MD   1 drop at 12/06/12 2200  . vasopressin (PITRESSIN) 50 Units in sodium chloride 0.9 % 250 mL  infusion  0.03 Units/min Intravenous Continuous Tommie Sams, DO 9 mL/hr at 12/07/12 0841 0.03 Units/min at 12/07/12 0841    Allergies as of 12/04/2012 - Review Complete 12/04/2012  Allergen Reaction Noted  . Hydrocodone-acetaminophen Other (See Comments) 10/01/2010  . Meloxicam  10/28/2006  . Penicillins Rash 10/01/2010    Family History  Problem Relation Age of Onset  . Kidney disease Mother   . Diabetes Mother   . Hypertension Mother   . Diabetes Brother   . Stroke Brother   . Stroke Brother     CVA  . Diabetes Brother     History  Social History  . Marital Status: Divorced    Spouse Name: N/A    Number of Children: 4  . Years of Education: N/A   Occupational History  . Disability     Retired disability hand injury via Tree surgeon   Social History Main Topics  . Smoking status: Never Smoker   . Smokeless tobacco: Never Used  . Alcohol Use: No  . Drug Use: No  . Sexually Active: Not on file   Other Topics Concern  . Not on file   Social History Narrative   Divorced 1979, 4  Children, 2 marriages reported ending in divorce.   The patient is currently living with her son who is her primary caretaker, son's name is Aurther Loft    Review of Systems: Ten point ROS is O/W negative except as mentioned in HPI.  Physical Exam: Vital signs in last 24 hours: Temp:  [97.6 F (36.4 C)-98.4 F (36.9 C)] 98.4 F (36.9 C) (07/16 0851) Pulse Rate:  [54-142] 76 (07/16 1010) Resp:  [10-24] 12 (07/16 1010) BP: (68-104)/(36-67) 104/48 mmHg (07/16 1010) SpO2:  [94 %-99 %] 96 % (07/16 1010) Arterial Line BP: (87-159)/(40-60) 127/49 mmHg (07/16 1010) Last BM Date: 12/05/12 General:   Alert, elderly, chronically ill-appearing, in NAD. Head:  Normocephalic and atraumatic. Eyes:  Sclera clear, no icterus.  Conjunctiva pink. Ears:  HOH. Mouth:  No deformity or lesions.   Lungs:  Mild crackles noted B/L. Heart:  Regular rate and rhythm; no murmurs, clicks, rubs,  or  gallops. Abdomen:  Soft, slightly distended.  BS present.  Diffuse TTP > in the lower abdomen without R/R/G. Rectal:  Deferred  Msk:  Symmetrical without gross deformities. Pulses:  Normal pulses noted. Extremities:  Without clubbing or edema. Neurologic:  Alert, but not oriented.  Mumbles and moans, but unable to make sense of what she is trying to say. Skin:  Intact without significant lesions or rashes.  Intake/Output from previous day: 07/15 0701 - 07/16 0700 In: 1404.3 [I.V.:1404.3] Out: 730 [Urine:730] Intake/Output this shift: Total I/O In: 179.9 [I.V.:179.9] Out: 35 [Urine:35]  Lab Results:  Recent Labs  12/05/12 0444 12/06/12 0300 12/07/12 0412  WBC 8.1 23.6* 26.8*  HGB 10.0* 9.7* 10.1*  HCT 27.4* 26.4* 27.5*  PLT 34* 12* 24*   BMET  Recent Labs  12/06/12 1120 12/06/12 2336 12/07/12 0412  NA 133* 131* 130*  K 3.7 3.9 3.9  CL 105 104 101  CO2 17* 17* 16*  GLUCOSE 126* 240* 319*  BUN 77* 81* 88*  CREATININE 2.30* 2.17* 2.24*  CALCIUM 7.9* 7.4* 7.4*   LFT  Recent Labs  12/07/12 0412  PROT 4.9*  ALBUMIN 1.8*  AST 60*  ALT 25  ALKPHOS 166*  BILITOT 2.7*   PT/INR  Recent Labs  12/05/12 1150  LABPROT 16.4*  INR 1.36   Studies/Results: US Renal Port  12/05/2012   *RADIOLOGY REPORT*  Clinical Data: Acute renal failure, history diabetes, hyperlipidemia  RENAL/URINARY TRACT ULTRASOUND COMPLETE PORTABLE  Comparison:  None  Findings:  Right Kidney:  10.0 cm length.  Question minimally increased renal cortical echogenicity.  No mass, hydronephrosis shadowing calcification.  Left Kidney:  11.1 cm length.  Upper normal cortical echogenicity. 7 mm echogenic nonshadowing focus at upper pole left kidney cannot exclude nonobstructing calculus.  Mild left hydronephrosis identified.  No gross evidence of mass  Bladder:  Decompressed by Foley catheter, unable to evaluate.  Incidentally noted bilateral pleural effusions.  IMPRESSION: Bilateral pleural effusions.  Mild left  hydronephrosis of uncertain etiology. Questionable 7 mm nonshadowing calculus upper pole left kidney.   Original Report Authenticated By: Ulyses Southward, M.D.   Dg Chest Port 1 View  12/07/2012   *RADIOLOGY REPORT*  Clinical Data: Edema, shortness of breath  PORTABLE CHEST - 1 VIEW  Comparison: 12/06/2012  Findings: Stable right IJ central line at the mid SVC level.  Heart remains enlarged with similar diffuse pulmonary edema and bilateral pleural effusions.  Slight increased basilar atelectasis bilaterally.  No pneumothorax.  Trachea is midline.  IMPRESSION: Persistent edema pattern and pleural effusions.  Increased basilar atelectasis   Original Report Authenticated By: Judie Petit. Miles Costain, M.D.   Dg Chest Port 1 View  12/06/2012   *RADIOLOGY REPORT*  Clinical Data: Shortness of breath.  PORTABLE CHEST - 1 VIEW  Comparison: None.  Findings: Central venous catheter tip lower SVC region.  Patchy parenchymal densities are slightly improved, favor resolving edema. Small right effusion.  No pneumothorax.  Surgical clips right upper abdomen.  IMPRESSION: Improving aeration.   Original Report Authenticated By: Davonna Belling, M.D.   Dg Chest Port 1 View  12/05/2012   *RADIOLOGY REPORT*  Clinical Data: Evaluate central line.  PORTABLE CHEST - 1 VIEW  Comparison: 12/05/2012  Findings: Right jugular central line tip is in the lower SVC region.  No change in the patchy parenchymal densities which may represent edema.  There is no evidence for a pneumothorax.  Heart size is grossly stable.  Surgical clips in the right upper abdomen.  IMPRESSION: Central line tip in the lower SVC region.  Negative for a pneumothorax.  Patchy parenchymal disease is suggestive for edema.   Original Report Authenticated By: Richarda Overlie, M.D.   Dg Chest Port 1 View  12/05/2012   *RADIOLOGY REPORT*  Clinical Data: Atelectasis.  PORTABLE CHEST - 1 VIEW  Comparison: None.  Findings: The heart is mildly enlarged.  Bilateral interstitial edema is  present.  A small right pleural effusion is suspected. The visualized soft tissues and bony thorax are unremarkable.  IMPRESSION:  1.  Cardiomegaly and mild interstitial edema suggesting congestive heart failure. 2.  Question small right pleural effusion.   Original Report Authenticated By: Marin Roberts, M.D.    IMPRESSION:  -Diarrhea/colitis seen on CT scan at outside hospital.  Possible mild colitis in descending colon.  ? Infectious vs ischemic.  Non-bloody stool with approximately 3 day history of diarrhea at home prior to presentation.  No significant diarrhea since admission. -Pyelonephritis -Septic shock with persistent hypotension requiring pressors. -AKI with metabolic acidosis and electrolyte abnormalities -History of pancytopenia with thrombocytopenia -Leukocytosis:  On meropenum. -Acute delerium.   PLAN: -Continue current antibiotics. -Will discuss with Dr. Arlyce Dice regarding repeat CT scan vs. colonoscopic evaluation or flex sig.   ZEHR, JESSICA D.  12/07/2012, 11:30 AM  Pager number [phone]  Chart was reviewed and patient was examined. CT scan not available for review. Labs were reviewed.  She was admitted with septic shock. Presumably this was due to 2 pyelonephritis. By history patient has had some diarrhea and questionable thickening of the left colon raising the question of colitis. In the absence of abdominal pain, ongoing diarrhea and bleeding I think it is unlikely that she has a clinically significant colitis.  If the patient remains clinically septic over the next 12-24 hours recommend repeating CT scan looking for another source for infection. I would defer GI procedures for the time being.  Check stool Hemoccults.  Barbette Hair. Arlyce Dice, M.D., [facility] Cell [phone]  I have reviewed the CT images.  There may be some colonic wall thickening of the left colon.  Findings are minor.

## 2012-12-08 ENCOUNTER — Inpatient Hospital Stay
Admission: AD | Admit: 2012-12-08 | Discharge: 2012-12-27 | Disposition: A | Discharge status: Skilled Nursing Facility | Payer: Self-pay | Source: Ambulatory Visit | Attending: Internal Medicine | Admitting: Internal Medicine

## 2012-12-08 ENCOUNTER — Inpatient Hospital Stay (HOSPITAL_COMMUNITY): Payer: Medicare Other

## 2012-12-08 DIAGNOSIS — N390 Urinary tract infection, site not specified: Secondary | ICD-10-CM

## 2012-12-08 DIAGNOSIS — R933 Abnormal findings on diagnostic imaging of other parts of digestive tract: Secondary | ICD-10-CM

## 2012-12-08 DIAGNOSIS — M79609 Pain in unspecified limb: Secondary | ICD-10-CM

## 2012-12-08 DIAGNOSIS — E119 Type 2 diabetes mellitus without complications: Secondary | ICD-10-CM

## 2012-12-08 LAB — BASIC METABOLIC PANEL
BUN: 91 mg/dL — ABNORMAL HIGH (ref 6–23)
CO2: 21 mEq/L (ref 19–32)
GFR calc non Af Amer: 24 mL/min — ABNORMAL LOW (ref >90)
Glucose, Bld: 65 mg/dL — ABNORMAL LOW (ref 70–99)
Potassium: 3 mEq/L — ABNORMAL LOW (ref 3.5–5.1)
Sodium: 134 mEq/L — ABNORMAL LOW (ref 135–145)

## 2012-12-08 LAB — CBC
HCT: 26.9 % — ABNORMAL LOW (ref 36.0–46.0)
Hemoglobin: 9.8 g/dL — ABNORMAL LOW (ref 12.0–15.0)
MCH: 30.9 pg (ref 26.0–34.0)
MCHC: 36.4 g/dL — ABNORMAL HIGH (ref 30.0–36.0)
RDW: 15 % (ref 11.5–15.5)

## 2012-12-08 LAB — MAGNESIUM: Magnesium: 2.3 mg/dL (ref 1.5–2.5)

## 2012-12-08 LAB — GLUCOSE, CAPILLARY
Glucose-Capillary: 101 mg/dL — ABNORMAL HIGH (ref 70–99)
Glucose-Capillary: 152 mg/dL — ABNORMAL HIGH (ref 70–99)
Glucose-Capillary: 236 mg/dL — ABNORMAL HIGH (ref 70–99)

## 2012-12-08 LAB — LACTIC ACID, PLASMA: Lactic Acid, Venous: 1.2 mmol/L (ref 0.5–2.2)

## 2012-12-08 MED ORDER — FENTANYL CITRATE 0.05 MG/ML IJ SOLN: 25.0000 ug | INTRAMUSCULAR | Status: DC | PRN

## 2012-12-08 MED ORDER — PANTOPRAZOLE SODIUM 40 MG PO TBEC: DELAYED_RELEASE_TABLET | ORAL | Status: DC

## 2012-12-08 MED ORDER — POTASSIUM CHLORIDE 10 MEQ/100ML IV SOLN: 10.0000 meq | INTRAVENOUS | Status: DC

## 2012-12-08 MED ORDER — MUPIROCIN 2 % EX OINT: 1.0000 "application " | TOPICAL_OINTMENT | Freq: Two times a day (BID) | CUTANEOUS | Status: DC

## 2012-12-08 MED ORDER — DEXTROSE 5 % IV SOLN: 2.0000 g | INTRAVENOUS | Status: DC

## 2012-12-08 MED ORDER — ONDANSETRON HCL 8 MG PO TABS: 8.0000 mg | ORAL_TABLET | Freq: Four times a day (QID) | ORAL | Status: DC | PRN

## 2012-12-08 MED ORDER — SODIUM CHLORIDE 0.9 % IV SOLN: 10.0000 mL | INTRAVENOUS | Status: DC

## 2012-12-08 MED ORDER — DEXTROSE 5 % IV SOLN
2.0000 g | INTRAVENOUS | Status: DC
  Filled 2012-12-08: qty 2

## 2012-12-08 MED ORDER — POTASSIUM CHLORIDE 10 MEQ/50ML IV SOLN
10.0000 meq | INTRAVENOUS | Status: AC
Start: 2012-12-08 — End: 2012-12-08
  Administered 2012-12-08 (×4): 10 meq via INTRAVENOUS
  Filled 2012-12-08: qty 50
  Filled 2012-12-08: qty 100

## 2012-12-08 MED ORDER — POTASSIUM CHLORIDE 10 MEQ/50ML IV SOLN
INTRAVENOUS | Status: AC
  Administered 2012-12-08: 10 meq via INTRAVENOUS
  Filled 2012-12-08: qty 50

## 2012-12-08 MED ORDER — ONDANSETRON 8 MG/NS 50 ML IVPB: 8.0000 mg | Freq: Four times a day (QID) | INTRAVENOUS | Status: DC | PRN

## 2012-12-08 MED ORDER — INSULIN ASPART 100 UNIT/ML ~~LOC~~ SOLN: 2.0000 [IU] | SUBCUTANEOUS | Status: DC

## 2012-12-08 MED ORDER — DEXTROSE 50 % IV SOLN
INTRAVENOUS | Status: AC
  Administered 2012-12-08: 25 mL
  Filled 2012-12-08: qty 50

## 2012-12-08 NOTE — Progress Notes (Signed)
[facility] Nursing ICU Electrolyte Replacement Protocol  Patient Name: SAFIYA GIRDLER DOB: [birth year] MRN: [id number]  Date of Service  12/08/2012   HPI/Events of Note    Recent Labs Lab 12/04/12 2156 12/05/12 0444  12/06/12 0300 12/06/12 1120 12/06/12 2336 12/07/12 0412 12/07/12 1132 12/08/12 0420  NA 125* 129*  < > 130* 133* 131* 130* 131* 134*  K 5.0 3.9  < > 3.5 3.7 3.9 3.9 3.5 3.0*  CL 99 102  < > 103 105 104 101 101 106  CO2 10* 10*  < > 11* 17* 17* 16* 17* 21  GLUCOSE 344* 207*  < > 160* 126* 240* 319* 287* 65*  BUN 71* 75*  < > 74* 77* 81* 88* 92* 91*  CREATININE 2.70* 2.89*  < > 2.42* 2.30* 2.17* 2.24* 2.20* 1.93*  CALCIUM 7.5* 7.9*  < > 7.3* 7.9* 7.4* 7.4* 7.6* 8.1*  MG 1.7 1.6  --  2.1  --   --   --   --   --   < > = values in this interval not displayed.  Estimated Creatinine Clearance: 21.8 ml/min (by C-G formula based on Cr of 1.93).  Intake/Output     07/16 0701 - 07/17 0700   I.V. (mL/kg) 422.7 (6.3)   Total Intake(mL/kg) 422.7 (6.3)   Urine (mL/kg/hr) 1760 (1.1)   Total Output 1760   Net -1337.3        - I/O DETAILED x24h    Total I/O In: 100 [I.V.:100] Out: 950 [Urine:950] - I/O THIS SHIFT    ASSESSMENT   eICURN Interventions  Electrolyte protocol criteria met not due to   NOT Replace per protocol.  MD notified    ASSESSMENT: MAJOR ELECTROLYTE    Merita Norton 12/08/2012, 5:54 AM

## 2012-12-08 NOTE — Progress Notes (Signed)
PULMONARY  / CRITICAL CARE MEDICINE  Name: Gina Andersen MRN: [id number] DOB: 08-Sep-1934    ADMISSION DATE:  12/04/2012  REFERRING MD :  [facility] PRIMARY SERVICE: CCM  CHIEF COMPLAINT:  Septic shock  BRIEF PATIENT DESCRIPTION:  Gina Andersen is a 78 y/o woman with past medical history significant for DM, hypothyroidism and pancytopenia who presented to [facility] today with 3 days of diarrhea, and lethargy. She was found to be hypotensive and acidotic. A urinalysis was consistent with UTI and CT abdominal consistent with pyelonephritis and possible colitis. She was given a dose of vancomycin and flagyl. A central line was placed in the femoral vein and she was started on levophed after she remained hypotensive after 2-3L fluids. She was transferred to [facility] ICU for further care.   SIGNIFICANT EVENTS / STUDIES:  7/13 - Admit and Central line placed; started on pressors 7/13 - UA consistent with UTI; C diff negative 7/14 - Renal US: Bilateral pleural effusions. Mild left hydronephrosis of uncertain etiology.  Questionable 7 mm nonshadowing calculus upper pole left kidney. 7/15 - PLT count 12; Transfused Platelets 7/15 - Developed Afib with RVR. Levo switched to Neosynephrine.  Digoxin given x 1.  7/15 - Echo completed -  ejection fraction was in the range of 35% to 40%. There is diffuse hypokinesis, disproportionately more severe in the inferior, inferolateral and apical walls. Moderate mitral regurgitation. Moderate tricuspid regurgitation.  LINES / TUBES: 7/13 Right Femoral Triple Lumen >>> 7/14 7/14 Right IJ Triple Lumen >>>  CULTURES: Blood Cx ([place]) 7/13 >> E coli Pan sensitive Urine Cx ( ) 7/13 >>> Klebsiella & E coli (E.col pan sensitive, Klebsiella resistant to ampicillin) Blood Cx 7/13 >>> NGTD Blood Cx 7/15 >>> NGTD Urine Cx 7/13 >>> E coli 60,000 CFU  ANTIBIOTICS: 7/13 Meropenem >>> 7/13 vanc>>>7/16  SUBJECTIVE:  Now off pressors  VITAL  SIGNS: Temp:  [97.5 F (36.4 C)-98.4 F (36.9 C)] 97.8 F (36.6 C) (07/17 0418) Pulse Rate:  [67-79] 79 (07/17 0600) Resp:  [9-22] 16 (07/17 0600) BP: (79-116)/(37-57) 97/46 mmHg (07/17 0500) SpO2:  [94 %-99 %] 94 % (07/17 0600) Arterial Line BP: (89-159)/(39-62) 150/62 mmHg (07/17 0600) HEMODYNAMICS: CVP:  [8 mmHg-19 mmHg] 19 mmHg VENTILATOR SETTINGS:   INTAKE / OUTPUT: Intake/Output     07/16 0701 - 07/17 0700 07/17 0701 - 07/18 0700   I.V. (mL/kg) 432.7 (6.5)    Total Intake(mL/kg) 432.7 (6.5)    Urine (mL/kg/hr) 1805 (1.1)    Emesis/NG output 1 (0)    Total Output 1806     Net -1373.3           PHYSICAL EXAMINATION: General: elderly lady, resting in bed, NAD Neuro: Awake, follows commands Cardiovascular:  RRR. No m/r/g Lungs: Crackles noted bibasilar Abdomen:  Soft, tender to palpation of lower abdomen. +BS Musculoskeletal: No LE edema.  Skin:  Intact  LABS:  Recent Labs Lab 12/04/12 2156 12/05/12 0444 12/05/12 1150  12/05/12 1214 12/05/12 1742  12/06/12 0300 12/06/12 0750 12/06/12 0800  12/07/12 0412 12/07/12 0734 12/07/12 0830 12/07/12 1132 12/08/12 0420  HGB 9.6* 10.0*  --   --   --   --   --  9.7*  --   --   --  10.1*  --   --   --  9.8*  WBC 20.5* 8.1  --   --   --   --   --  23.6*  --   --   --  26.8*  --   --   --  11.4*  PLT 43* 34*  --   --   --   --   --  12*  --   --   --  24*  --   --   --  23*  NA 125* 129* 127*  --   --   --   < > 130*  --   --   < > 130*  --   --  131* 134*  K 5.0 3.9 3.9  --   --   --   < > 3.5  --   --   < > 3.9  --   --  3.5 3.0*  CL 99 102 101  --   --   --   < > 103  --   --   < > 101  --   --  101 106  CO2 10* 10* 8*  --   --   --   < > 11*  --   --   < > 16*  --   --  17* 21  GLUCOSE 344* 207* 237*  --   --   --   < > 160*  --   --   < > 319*  --   --  287* 65*  BUN 71* 75* 76*  --   --   --   < > 74*  --   --   < > 88*  --   --  92* 91*  CREATININE 2.70* 2.89* 2.91*  --   --   --   < > 2.42*  --   --   < > 2.24*   --   --  2.20* 1.93*  CALCIUM 7.5* 7.9* 7.8*  --   --   --   < > 7.3*  --   --   < > 7.4*  --   --  7.6* 8.1*  MG 1.7 1.6  --   --   --   --   --  2.1  --   --   --   --   --   --   --   --   AST 56*  --   --   --   --   --   --   --   --   --   --  60*  --   --   --   --   ALT 19  --   --   --   --   --   --   --   --   --   --  25  --   --   --   --   ALKPHOS 108  --   --   --   --   --   --   --   --   --   --  166*  --   --   --   --   BILITOT 2.7*  --   --   --   --   --   --   --   --   --   --  2.7*  --   --   --   --   PROT 5.7*  --   --   --   --   --   --   --   --   --   --  4.9*  --   --   --   --  ALBUMIN 2.1*  --   --   --   --   --   --   --   --   --   --  1.8*  --   --   --   --   APTT 33  --   --   --   --   --   --   --   --   --   --   --   --   --   --   --   INR  --   --  1.36  --   --   --   --   --   --   --   --   --   --   --   --   --   LATICACIDVEN 2.5*  --  4.0*  --   --   --   --   --   --  2.6*  --   --  3.2*  --   --   --   TROPONINI  --   --   --   --   --   --   --   --   --   --   --   --   --  <0.30  --   --   PHART  --   --   --   < > 7.263* 7.291*  --   --  7.382  --   --   --   --   --   --   --   PCO2ART  --   --   --   < > 16.8* 19.3*  --   --  23.2*  --   --   --   --   --   --   --   PO2ART  --   --   --   < > 83.0 80.0  --   --  72.0*  --   --   --   --   --   --   --   < > = values in this interval not displayed.  Recent Labs Lab 12/07/12 1138 12/07/12 1609 12/07/12 1922 12/07/12 2346 12/08/12 0411  GLUCAP 293* 149* 154* 152* 60*   CXR:   ASSESSMENT / PLAN:  PULMONARY  A: Chest xray showing Pulmonary edema but no distress , on minimal oxygen P:  Stable on 2L [place]  Continuous pulse ox   CARDIOVASCULAR  A: Hypotension secondary to septic shock - off pressors Paroxysmal afib - developed Afib with RVR on 7/15 CHF (EF 35-40% on Echo) - r/o septic cardiomyopathy Mild PA HTN 40 P:  Off pressors Lactic acidosis improving Patient now in  sinus rhythm Echo revealed decreased EF >> may need diuresis Troponin negative; EKG non-ischemic Will need risk stratification pre hosp dc-cards No Asa- plat 24 k    RENAL  A: AKI - secondary to hypovolemia and septic shock, ATN Hypomagnesemia - resolved Hyponatremia - improving Hypokalemia  Metabolic acidosis - resolved P:  Renal US - mild L hydro; Urology consulted - no intervention necessary BMP daily  Replete electrolytes as needed Negative balance; Good urine output; AKI improving   GASTROINTESTINAL  A: Diarrhea and evidence of colitis on CT scan P:  C. Dif - Neg Lactic acid pending this am GI consulted on 7/16 >> Possible repeat CT scan if does not improve/remains septic  HEMATOLOGIC  A: Thrombocytopenia - hx  of pancytopenia, leukocytosis, dilutional affects to plat as well P:  Likely secondary to sepsis and underlying pancytopenia. Baseline Hb 11-12; Baseline PLT 90-100 No heparin, SCDs for DVT prophylaxis.  PLT count stable at 24 WOULD NOT TX PLAT unless bleeding or less 10 k  INFECTIOUS  A: Colitis, pyelonephritis; for culture results see above P:  Off pressors Will de-escalate to CTX based on Culture results Urology consulted >> No intervention necessary GI consulted regarding colitis >> Repeat CT abdomen if remains septic/fails to improve Repeat BCx negative; No vegetations on Echocardiogram  ENDOCRINE  A: DM2, poor diet intake P:  CBG's Q4 SSI per protocol CBG's now well controlled  NEUROLOGIC  A: Delerium - improving P:  Secondary to acute illness Treatment as above  TODAY'S SUMMARY: Improving, now off pressors; will de-escalate abx; transfer out of unit to stepdown  Everlene Other DO Family Medicine PGY-2  I have fully examined this patient and agree with above findings.    And edited infull  Ccm time 30 min  Mcarthur Rossetti. Tyson Alias, MD, FACP Pgr: [phone] [facility]  See dc summary for continued details Mcarthur Rossetti.  Tyson Alias, MD, FACP Pgr: [phone] [place] [facility]]

## 2012-12-08 NOTE — Discharge Summary (Signed)
Physician Discharge Summary  Patient ID: ADIAH GUERECA MRN: [id number] DOB/AGE: 1934/12/06 78 y.o.  Admit date: 12/04/2012 Discharge date: 12/08/2012   Discharge Diagnoses:  Septic shock secondary to Pyelonephritis and/or Colitis Lactic acidosis Paroxysmal atrial fibrillation AKI DM-2 Thrombocytopenia Acute delirium                                                                DISCHARGE PLAN BY DIAGNOSIS   Septic shock secondary to Pyelonephritis and/or Colitis >> Continue IV Rocephin x 10 days (to complete 14 day abx course; End date 7/27)  Lactic acidosis - Resolved following treatment of sepsis  Paroxysmal atrial fibrillation - Now in sinus rhythm.  AKI - improving.  Recommend regular BMP to assess creatinine (every day to every other day)  DM-2 - Continue Sliding scale insulin.  Once eating well may start lantus.  Thrombocytopenia - Continue to monitor closely.  Recommend regular CBC to reassess.  Acute delirium - resolved following treatment of sepsis.               DISCHARGE SUMMARY   Gina Andersen is a 78 y.o. y/o female with a PMH of DM, hypothyroidism and pancytopenia who presented to [facility] today with 3 days of diarrhea, and lethargy. She was found to be hypotensive and acidotic. A urinalysis was consistent with UTI and CT abdomen revealed pyelonephritis and possible colitis. She was given a dose of vancomycin and flagyl and a central line was placed. She was started on levophed after she remained hypotensive after 2-3L fluids. She was then transferred to [facility] ICU for further care.   # Septic shock secondary to Pyelonephritis and/or Colitis, Lactic acidosis - Patient was treated with aggressive IV fluids initially but remained hypotensive.  Urine and blood cultures were obtained and she was started on Vanc and Meropenem. She was then started on pressors (initally Levophed, but this was later changed to Neosynephrine and vasopressin). - Patient slowly  improved with IV fluids and antibiotics.  Pressors were slowly weaned off. - Urine Cx revealed Klebsiella & E coli (E.col pan sensitive, Klebsiella resistant to ampicillin); Blood cultures revealed  Pan sensitive E. Coli. - Patient was then transitioned to Ceftriaxone.   # Paroxysmal atrial fibrillation with RVR - During admission patient developed Atrial fibrillation.  - She was treated with IV digoxin x 1.   - Patient later returned to normal sinus rhythm.  # AKI - AKI was secondary to above (hypovolumemia, septic shock)  - Creatinine was 2.70 on admission. - With IV fluids and treatment of underlying sepsis, creatinine improved markedly.  Creatinine 1.93 on discharge. - This will need close follow up.  # DM-2 - DM was treated with SSI during admission. - CBG's were well controlled upon discharge.  # Thrombocytopenia - Patient with history of pancytopenia - PLT count markedly decreased in the setting of sepsis (down to 12,000) during admission - Patient had no active bleeding during admission.  Patient did receive 1 unit of Platelets during admission. - PLT count 23,000 on discharge.  # Acute delirium - Resolved following treatment of underlying sepsis  SIGNIFICANT DIAGNOSTIC STUDIES US Renal Port 12/05/2012  IMPRESSION: Bilateral pleural effusions. Mild left hydronephrosis of uncertain etiology. Questionable 7 mm nonshadowing calculus upper pole left kidney.  Dg Chest Port 1 View  12/08/2012   IMPRESSION: Improving pulmonary edema pattern. Suspect residual alveolar edema/atelectasis at the left base with pneumonia not completely excluded.      MICRO DATA  Blood Cx ([place]) 7/13 >> E coli Pan sensitive  Urine Cx ([place] ) 7/13 >>> Klebsiella & E coli (E.col pan sensitive, Klebsiella resistant to ampicillin)  Blood Cx 7/13 >>> NGTD  Blood Cx 7/15 >>> NGTD  Urine Cx 7/13 >>> E coli 60,000 CFU  ANTIBIOTICS 7/13 Meropenem >>>  7/13 vanc>>>7/16  CONSULTS Urology;  GI  TUBES / LINES 7/13 Right Femoral Triple Lumen >>> 7/14  7/14 Right IJ Triple Lumen >>>  Discharge Exam: General: elderly lady, resting in bed, NAD  Neuro: Awake, follows commands  Cardiovascular: RRR. No m/r/g  Lungs: Crackles noted bibasilar  Abdomen: Soft, tender to palpation of lower abdomen. +BS  Musculoskeletal: No LE edema.  Skin: Intact  Filed Vitals:   12/08/12 0900 12/08/12 1000 12/08/12 1100 12/08/12 1200  BP: 94/42 98/43 95/43  99/43  Pulse: 73 78 78 79  Temp:      TempSrc:      Resp: 21 23 12 15   Height:      Weight:      SpO2: 99% 97% 97% 96%   Discharge Labs  BMET  Recent Labs Lab 12/04/12 2156 12/05/12 0444  12/06/12 0300 12/06/12 1120 12/06/12 2336 12/07/12 0412 12/07/12 1132 12/08/12 0420  NA 125* 129*  < > 130* 133* 131* 130* 131* 134*  K 5.0 3.9  < > 3.5 3.7 3.9 3.9 3.5 3.0*  CL 99 102  < > 103 105 104 101 101 106  CO2 10* 10*  < > 11* 17* 17* 16* 17* 21  GLUCOSE 344* 207*  < > 160* 126* 240* 319* 287* 65*  BUN 71* 75*  < > 74* 77* 81* 88* 92* 91*  CREATININE 2.70* 2.89*  < > 2.42* 2.30* 2.17* 2.24* 2.20* 1.93*  CALCIUM 7.5* 7.9*  < > 7.3* 7.9* 7.4* 7.4* 7.6* 8.1*  MG 1.7 1.6  --  2.1  --   --   --   --  2.3  < > = values in this interval not displayed.  CBC  Recent Labs Lab 12/06/12 0300 12/07/12 0412 12/08/12 0420  HGB 9.7* 10.1* 9.8*  HCT 26.4* 27.5* 26.9*  WBC 23.6* 26.8* 11.4*  PLT 12* 24* 23*    Anti-Coagulation  Recent Labs Lab 12/05/12 1150  INR 1.36    Discharge Orders   Future Orders Complete By Expires     Discharge patient  As directed     Comments:      To [facility]          Follow-up Information   Follow up with PCP. (Patient needs follow up with PCP on discharge from [facility])        Medication List    STOP taking these medications       esomeprazole 40 MG capsule  Commonly known as:  NEXIUM     freestyle lancets     glucose blood test strip     insulin glargine 100 UNIT/ML injection   Commonly known as:  LANTUS     Insulin Syringe-Needle U-100 31G X 5/16" 0.5 ML Misc  Commonly known as:  BD INSULIN SYRINGE ULTRAFINE     lisinopril 5 MG tablet  Commonly known as:  PRINIVIL,ZESTRIL     LORazepam 1 MG tablet  Commonly known as:  ATIVAN  metFORMIN 500 MG tablet  Commonly known as:  GLUCOPHAGE      TAKE these medications       dextrose 5 % SOLN 50 mL with cefTRIAXone 2 G SOLR 2 g  Inject 2 g into the vein daily.     fentaNYL 0.05 MG/ML injection  Commonly known as:  SUBLIMAZE  Inject 0.5-1 mLs (25-50 mcg total) into the vein every 2 (two) hours as needed for severe pain.     insulin aspart 100 UNIT/ML injection  Commonly known as:  novoLOG  Inject 2-6 Units into the skin every 4 (four) hours.     levothyroxine 50 MCG tablet  Commonly known as:  SYNTHROID, LEVOTHROID  Take 50 mcg by mouth daily before breakfast.     mupirocin ointment 2 %  Commonly known as:  BACTROBAN  Apply 1 application topically 2 (two) times daily.     ondansetron 8 MG/50ML Soln  Commonly known as:  ZOFRAN  Inject 8 mg into the vein every 6 (six) hours as needed.     ondansetron 8 MG tablet  Commonly known as:  ZOFRAN  Take 1 tablet (8 mg total) by mouth every 6 (six) hours as needed.     pantoprazole 40 MG tablet  Commonly known as:  PROTONIX  1 tablet PO day.     potassium chloride SA 20 MEQ tablet  Commonly known as:  KLOR-CON M20  Take 1 tablet (20 mEq total) by mouth 2 (two) times daily.     pregabalin 25 MG capsule  Commonly known as:  LYRICA  Take 25 mg by mouth 2 (two) times daily.     sodium chloride 0.9 % infusion  Inject 10-20 mLs into the vein continuous.     travoprost (benzalkonium) 0.004 % ophthalmic solution  Commonly known as:  TRAVATAN  Place 1 drop into both eyes at bedtime.       Disposition: [facility] - [facility]   Discharged Condition: ZYNIA WOJTOWICZ has met maximum benefit of inpatient care and is medically stable and cleared for discharge.    Time spent on disposition:  Greater than 35 minutes.   Signed: Everlene Other DO Family Medicine PGY-2  Agree with narrow of abx, extended to 14 days Ceftriaxone No role intervention, GI, urology at this time, lactic acid resolving Renal fxn better To [facility]  I have fully examined this patient and agree with above findings.      Mcarthur Rossetti. Tyson Alias, MD, FACP Pgr: [phone] [facility]

## 2012-12-08 NOTE — Progress Notes (Signed)
ANTIBIOTIC CONSULT NOTE - FOLLOW UP  Pharmacy Consult:  Merrem Indication:  Pyelonephritis + GNR sepsis  Allergies  Allergen Reactions  . Hydrocodone-Acetaminophen Other (See Comments)    dizziness  . Meloxicam     REACTION: VOMITING  . Penicillins Rash    Patient Measurements: Height: 5' 0.63" (154 cm) Weight: 147 lb 7.8 oz (66.9 kg) IBW/kg (Calculated) : 46.95  Vital Signs: Temp: 97.8 F (36.6 C) (07/17 0418) Temp src: Oral (07/17 0418) BP: 97/46 mmHg (07/17 0500) Pulse Rate: 79 (07/17 0600) Intake/Output from previous day: 07/16 0701 - 07/17 0700 In: 432.7 [I.V.:432.7] Out: 1806 [Urine:1805; Emesis/NG output:1]  Labs:  Recent Labs  12/06/12 0300  12/07/12 0412 12/07/12 1132 12/08/12 0420  WBC 23.6*  --  26.8*  --  11.4*  HGB 9.7*  --  10.1*  --  9.8*  PLT 12*  --  24*  --  23*  CREATININE 2.42*  < > 2.24* 2.20* 1.93*  < > = values in this interval not displayed. Estimated Creatinine Clearance: 21.8 ml/min (by C-G formula based on Cr of 1.93). No results found for this basename: VANCOTROUGH, Leodis Binet, VANCORANDOM, GENTTROUGH, GENTPEAK, GENTRANDOM, TOBRATROUGH, TOBRAPEAK, TOBRARND, AMIKACINPEAK, AMIKACINTROU, AMIKACIN,  in the last 72 hours   Microbiology: Recent Results (from the past 720 hour(s))  MRSA PCR SCREENING     Status: Abnormal   Collection Time    12/04/12  8:56 PM      Result Value Range Status   MRSA by PCR POSITIVE (*) NEGATIVE Final   Comment:            The GeneXpert MRSA Assay (FDA     approved for NASAL specimens     only), is one component of a     comprehensive MRSA colonization     surveillance program. It is not     intended to diagnose MRSA     infection nor to guide or     monitor treatment for     MRSA infections.     RESULT CALLED TO, READ BACK BY AND VERIFIED WITH:     KEB,B RN [id number] 12/05/12 MITCHELL,L  URINE CULTURE     Status: None   Collection Time    12/04/12  9:01 PM      Result Value Range Status   Specimen  Description URINE, CATHETERIZED   Final   Special Requests ADDED 2135   Final   Culture  Setup Time 12/05/2012 02:48   Final   Colony Count 60,000 COLONIES/ML   Final   Culture     Final   Value: ESCHERICHIA COLI     Note: Two isolates with different morphologies were identified as the same organism.The most resistant organism was reported.   Report Status 12/07/2012 FINAL   Final   Organism ID, Bacteria ESCHERICHIA COLI   Final  CULTURE, BLOOD (ROUTINE X 2)     Status: None   Collection Time    12/04/12 10:00 PM      Result Value Range Status   Specimen Description BLOOD LEFT HAND   Final   Special Requests BOTTLES DRAWN AEROBIC ONLY 6CC   Final   Culture  Setup Time 12/05/2012 02:48   Final   Culture     Final   Value:        BLOOD CULTURE RECEIVED NO GROWTH TO DATE CULTURE WILL BE HELD FOR 5 DAYS BEFORE ISSUING A FINAL NEGATIVE REPORT   Report Status PENDING   Incomplete  CULTURE, BLOOD (  ROUTINE X 2)     Status: None   Collection Time    12/04/12 10:03 PM      Result Value Range Status   Specimen Description BLOOD RIGHT HAND   Final   Special Requests BOTTLES DRAWN AEROBIC AND ANAEROBIC 10CC EA   Final   Culture  Setup Time 12/05/2012 02:48   Final   Culture     Final   Value:        BLOOD CULTURE RECEIVED NO GROWTH TO DATE CULTURE WILL BE HELD FOR 5 DAYS BEFORE ISSUING A FINAL NEGATIVE REPORT   Report Status PENDING   Incomplete  CLOSTRIDIUM DIFFICILE BY PCR     Status: None   Collection Time    12/04/12 10:35 PM      Result Value Range Status   C difficile by pcr NEGATIVE  NEGATIVE Final  URINE CULTURE     Status: None   Collection Time    12/06/12 10:53 AM      Result Value Range Status   Specimen Description URINE, CATHETERIZED   Final   Special Requests NONE   Final   Culture  Setup Time 12/06/2012 11:43   Final   Colony Count NO GROWTH   Final   Culture NO GROWTH   Final   Report Status 12/07/2012 FINAL   Final  CULTURE, BLOOD (ROUTINE X 2)     Status: None    Collection Time    12/06/12 11:20 AM      Result Value Range Status   Specimen Description BLOOD LEFT HAND   Final   Special Requests BOTTLES DRAWN AEROBIC ONLY 10CC   Final   Culture  Setup Time 12/06/2012 16:28   Final   Culture     Final   Value:        BLOOD CULTURE RECEIVED NO GROWTH TO DATE CULTURE WILL BE HELD FOR 5 DAYS BEFORE ISSUING A FINAL NEGATIVE REPORT   Report Status PENDING   Incomplete  CULTURE, BLOOD (ROUTINE X 2)     Status: None   Collection Time    12/06/12 11:25 AM      Result Value Range Status   Specimen Description BLOOD LEFT HAND   Final   Special Requests BOTTLES DRAWN AEROBIC ONLY 5CC   Final   Culture  Setup Time 12/06/2012 16:28   Final   Culture     Final   Value:        BLOOD CULTURE RECEIVED NO GROWTH TO DATE CULTURE WILL BE HELD FOR 5 DAYS BEFORE ISSUING A FINAL NEGATIVE REPORT   Report Status PENDING   Incomplete         Assessment: 61 YOF transferred from [place] [facility] with pyelonephritis and GNR sepsis to continue on Merrem.  Patient's renal function and urine output are improving.  Merrem 7/13 >> Vanc 7/13 >> 7/16  7/14 VR = 8.8 mcg/mL (post 750mg  on 7/13 PM)   7/13 MRSA PCR - positive 7/13 urine cx - 60K c/mL E.coli (sensitive to Ancef, LVQ but I to Cipro, Macrobid, Zosyn, Septra) 7/13 blood cx - NGTD 7/13 C.diff PCR - negative [place]'s blood cx - GNR (2 of 2) 7/15 blood cx - NGTD   Goal of Therapy:  Clearance of infection   Plan:  - Merrem 500mg  IV Q12H - Monitor renal fxn and adjust dose as necessary - Watch CBGs    Roarke Marciano D. Laney Potash, PharmD, BCPS Pager:  [phone] 12/08/2012, 7:12 AM

## 2012-12-08 NOTE — Progress Notes (Signed)
eLink Physician Progress Note and Electrolyte Replacement  Patient Name: Gina Andersen DOB: 11/22/1934 MRN: [id number]  Date of Service  12/08/2012   HPI/Events of Note    Recent Labs Lab 12/04/12 2156 12/05/12 0444  12/06/12 0300 12/06/12 1120 12/06/12 2336 12/07/12 0412 12/07/12 1132 12/08/12 0420  NA 125* 129*  < > 130* 133* 131* 130* 131* 134*  K 5.0 3.9  < > 3.5 3.7 3.9 3.9 3.5 3.0*  CL 99 102  < > 103 105 104 101 101 106  CO2 10* 10*  < > 11* 17* 17* 16* 17* 21  GLUCOSE 344* 207*  < > 160* 126* 240* 319* 287* 65*  BUN 71* 75*  < > 74* 77* 81* 88* 92* 91*  CREATININE 2.70* 2.89*  < > 2.42* 2.30* 2.17* 2.24* 2.20* 1.93*  CALCIUM 7.5* 7.9*  < > 7.3* 7.9* 7.4* 7.4* 7.6* 8.1*  MG 1.7 1.6  --  2.1  --   --   --   --   --   < > = values in this interval not displayed.  Estimated Creatinine Clearance: 21.8 ml/min (by C-G formula based on Cr of 1.93).  Intake/Output     07/16 0701 - 07/17 0700   I.V. (mL/kg) 432.7 (6.5)   Total Intake(mL/kg) 432.7 (6.5)   Urine (mL/kg/hr) 1805 (1.1)   Emesis/NG output 1 (0)   Total Output 1806   Net -1373.3        - I/O DETAILED x 24h    Total I/O In: 110 [I.V.:110] Out: 996 [Urine:995; Emesis/NG output:1] - I/O THIS SHIFT    ASSESSMENT Mild hypokalemia  eICURN Interventions  kcl x 3   ASSESSMENT: MAJOR ELECTROLYTE      Dr. Kalman Shan, M.D., [facility].C.P Pulmonary and Critical Care Medicine Staff Physician [facility] [place] [facility] Pager: [phone], If no answer or between  15:00h - 7:00h: call [phone]  12/08/2012 6:34 AM

## 2012-12-08 NOTE — Progress Notes (Signed)
UR Completed.  Gina Andersen [name] [phone] 12/08/2012  

## 2012-12-08 NOTE — Progress Notes (Signed)
[facility] Progress Note   Subjective  no complaints, endorses abdominal discomfort (points to LLQ)   Objective   Vital signs in last 24 hours: Temp:  [97.4 F (36.3 C)-97.9 F (36.6 C)] 97.4 F (36.3 C) (07/17 0831) Pulse Rate:  [67-79] 79 (07/17 1200) Resp:  [9-23] 15 (07/17 1200) BP: (79-116)/(37-57) 99/43 mmHg (07/17 1200) SpO2:  [94 %-99 %] 96 % (07/17 1200) Arterial Line BP: (89-150)/(39-62) 118/39 mmHg (07/17 1100) Last BM Date: 12/05/12 General:    white female in NAD Abdomen:  Soft,nondistended, mild LLQ tenderness. Normal bowel sounds. Extremities:  Without edema. Neurologic:  Alert, cooperative.  Lab Results:  Recent Labs  12/06/12 0300 12/07/12 0412 12/08/12 0420  WBC 23.6* 26.8* 11.4*  HGB 9.7* 10.1* 9.8*  HCT 26.4* 27.5* 26.9*  PLT 12* 24* 23*   BMET  Recent Labs  12/07/12 0412 12/07/12 1132 12/08/12 0420  NA 130* 131* 134*  K 3.9 3.5 3.0*  CL 101 101 106  CO2 16* 17* 21  GLUCOSE 319* 287* 65*  BUN 88* 92* 91*  CREATININE 2.24* 2.20* 1.93*  CALCIUM 7.4* 7.6* 8.1*   LFT  Recent Labs  12/07/12 0412  PROT 4.9*  ALBUMIN 1.8*  AST 60*  ALT 25  ALKPHOS 166*  BILITOT 2.7*     Assessment / Plan:   1. Diarrhea/ mild descending colitis seen on CT scan at outside hospital.  No significant diarrhea since admission and none today per RN. Etiology of diarrhea, probably infectious. C-diff is negative. Ischemia not excluded but usually associated with hematochezia. She is being discharged to [facility] today  2. Septic shock possibly from pyelo / UTI, off pressors. Blood cultures negative so far. Doubtful colitis source of sepsis as it was apparently mild by CTscan.   3. Pyelonephritis, urology has seen. She is on rocephin.     LOS: 4 days   Willette Cluster  12/08/2012, 12:15 PM  I have personally taken an interval history, reviewed the chart, and examined the patient.  I agree with the extender's note, impression and  recommendations.   Signing off.  Barbette Hair. Arlyce Dice, MD, [facility] [phone]

## 2012-12-08 NOTE — Progress Notes (Signed)
Inpatient Diabetes Program Recommendations  AACE/ADA: New Consensus Statement on Inpatient Glycemic Control (2013)  Target Ranges:  Prepandial:   less than 140 mg/dL      Peak postprandial:   less than 180 mg/dL (1-2 hours)      Critically ill patients:  140 - 180 mg/dL   Reason for Visit: Results for Gina Andersen, Gina Andersen (MRN [id number]) as of 12/08/2012 12:49  Ref. Range 12/07/2012 16:09 12/07/2012 19:22 12/07/2012 23:46 12/08/2012 04:11 12/08/2012 07:49  Glucose-Capillary Latest Range: 70-99 mg/dL 086 (H) 578 (H) 469 (H) 60 (L) 101 (H)   Please consider reducing correction Novolog to sensitive on the "ICU Glycemic Control Protocol".

## 2012-12-09 ENCOUNTER — Telehealth: Payer: Self-pay | Admitting: *Deleted

## 2012-12-09 ENCOUNTER — Other Ambulatory Visit (HOSPITAL_COMMUNITY): Payer: Self-pay

## 2012-12-09 LAB — CBC WITH DIFFERENTIAL/PLATELET
Basophils Absolute: 0 10*3/uL (ref 0.0–0.1)
Eosinophils Absolute: 0.1 10*3/uL (ref 0.0–0.7)
HCT: 26.7 % — ABNORMAL LOW (ref 36.0–46.0)
Lymphocytes Relative: 11 % — ABNORMAL LOW (ref 12–46)
MCHC: 36 g/dL (ref 30.0–36.0)
Neutro Abs: 8.2 10*3/uL — ABNORMAL HIGH (ref 1.7–7.7)
Neutrophils Relative %: 79 % — ABNORMAL HIGH (ref 43–77)
RDW: 14.8 % (ref 11.5–15.5)

## 2012-12-09 LAB — PROTIME-INR: INR: 1.29 (ref 0.00–1.49)

## 2012-12-09 LAB — MAGNESIUM: Magnesium: 2.2 mg/dL (ref 1.5–2.5)

## 2012-12-09 NOTE — Telephone Encounter (Signed)
Prior authorization fax received from [facility] regarding zofran 8 mg - [phone] called  - Med is approved until 12/09/13. [facility] informed via fax. Wyatt Haste, RN-BSN

## 2012-12-10 LAB — BASIC METABOLIC PANEL
CO2: 25 mEq/L (ref 19–32)
Chloride: 107 mEq/L (ref 96–112)
Potassium: 3.2 mEq/L — ABNORMAL LOW (ref 3.5–5.1)
Sodium: 139 mEq/L (ref 135–145)

## 2012-12-10 LAB — CBC
MCV: 85.5 fL (ref 78.0–100.0)
Platelets: 64 10*3/uL — ABNORMAL LOW (ref 150–400)
RBC: 2.75 MIL/uL — ABNORMAL LOW (ref 3.87–5.11)
WBC: 10.9 10*3/uL — ABNORMAL HIGH (ref 4.0–10.5)

## 2012-12-11 ENCOUNTER — Telehealth: Payer: Self-pay | Admitting: Family Medicine

## 2012-12-11 LAB — CULTURE, BLOOD (ROUTINE X 2): Culture: NO GROWTH

## 2012-12-11 LAB — URINALYSIS, ROUTINE W REFLEX MICROSCOPIC
Glucose, UA: NEGATIVE mg/dL
Specific Gravity, Urine: 1.01 (ref 1.005–1.030)
pH: 5.5 (ref 5.0–8.0)

## 2012-12-11 LAB — URINE MICROSCOPIC-ADD ON

## 2012-12-11 NOTE — Telephone Encounter (Signed)
Difficulty in reaching patient in the past. Discharged from [facility] after urosepsis with extended IV CTX course. Discharged to [facility] - will need f/u here after sent home from [facility] facility. Can we call family for an update and to remind them to set up appt when pt discharged from long term acute care facility?

## 2012-12-12 ENCOUNTER — Other Ambulatory Visit (HOSPITAL_COMMUNITY): Payer: Self-pay

## 2012-12-12 LAB — BASIC METABOLIC PANEL
BUN: 49 mg/dL — ABNORMAL HIGH (ref 6–23)
CO2: 31 mEq/L (ref 19–32)
Calcium: 8.3 mg/dL — ABNORMAL LOW (ref 8.4–10.5)
Chloride: 102 mEq/L (ref 96–112)
Creatinine, Ser: 1.06 mg/dL (ref 0.50–1.10)
Glucose, Bld: 161 mg/dL — ABNORMAL HIGH (ref 70–99)

## 2012-12-12 LAB — CBC
HCT: 25.2 % — ABNORMAL LOW (ref 36.0–46.0)
Hemoglobin: 8.6 g/dL — ABNORMAL LOW (ref 12.0–15.0)
MCH: 30.9 pg (ref 26.0–34.0)
MCHC: 34.1 g/dL (ref 30.0–36.0)
MCV: 90.6 fL (ref 78.0–100.0)
Platelets: 83 10*3/uL — ABNORMAL LOW (ref 150–400)
RBC: 2.78 MIL/uL — ABNORMAL LOW (ref 3.87–5.11)
RDW: 16 % — ABNORMAL HIGH (ref 11.5–15.5)
WBC: 6.2 10*3/uL (ref 4.0–10.5)

## 2012-12-12 LAB — CULTURE, BLOOD (ROUTINE X 2): Culture: NO GROWTH

## 2012-12-13 LAB — BASIC METABOLIC PANEL
CO2: 35 mEq/L — ABNORMAL HIGH (ref 19–32)
Calcium: 8.2 mg/dL — ABNORMAL LOW (ref 8.4–10.5)
Glucose, Bld: 249 mg/dL — ABNORMAL HIGH (ref 70–99)
Potassium: 3.5 mEq/L (ref 3.5–5.1)
Sodium: 136 mEq/L (ref 135–145)

## 2012-12-13 LAB — URINE CULTURE: Colony Count: NO GROWTH

## 2012-12-14 LAB — BASIC METABOLIC PANEL
CO2: 33 mEq/L — ABNORMAL HIGH (ref 19–32)
Calcium: 8.4 mg/dL (ref 8.4–10.5)
Chloride: 98 mEq/L (ref 96–112)
Creatinine, Ser: 1.33 mg/dL — ABNORMAL HIGH (ref 0.50–1.10)
Glucose, Bld: 233 mg/dL — ABNORMAL HIGH (ref 70–99)
Sodium: 137 mEq/L (ref 135–145)

## 2012-12-14 LAB — HEMOGLOBIN A1C
Hgb A1c MFr Bld: 8.9 % — ABNORMAL HIGH (ref <5.7)
Mean Plasma Glucose: 209 mg/dL — ABNORMAL HIGH (ref <117)

## 2012-12-14 LAB — CBC
Hemoglobin: 8.5 g/dL — ABNORMAL LOW (ref 12.0–15.0)
MCH: 31.3 pg (ref 26.0–34.0)
MCV: 94.5 fL (ref 78.0–100.0)
Platelets: 99 10*3/uL — ABNORMAL LOW (ref 150–400)
RBC: 2.72 MIL/uL — ABNORMAL LOW (ref 3.87–5.11)
WBC: 7.7 10*3/uL (ref 4.0–10.5)

## 2012-12-15 LAB — BASIC METABOLIC PANEL
CO2: 31 mEq/L (ref 19–32)
Chloride: 99 mEq/L (ref 96–112)
GFR calc Af Amer: 45 mL/min — ABNORMAL LOW (ref >90)
Potassium: 4.1 mEq/L (ref 3.5–5.1)

## 2012-12-15 LAB — CBC
HCT: 25 % — ABNORMAL LOW (ref 36.0–46.0)
Hemoglobin: 8.4 g/dL — ABNORMAL LOW (ref 12.0–15.0)
MCV: 94.7 fL (ref 78.0–100.0)
WBC: 7.5 10*3/uL (ref 4.0–10.5)

## 2012-12-17 DIAGNOSIS — M79609 Pain in unspecified limb: Secondary | ICD-10-CM

## 2012-12-17 LAB — CBC
HCT: 25.3 % — ABNORMAL LOW (ref 36.0–46.0)
Hemoglobin: 8.7 g/dL — ABNORMAL LOW (ref 12.0–15.0)
MCH: 32.1 pg (ref 26.0–34.0)
MCV: 93.4 fL (ref 78.0–100.0)
RBC: 2.71 MIL/uL — ABNORMAL LOW (ref 3.87–5.11)
WBC: 8.5 10*3/uL (ref 4.0–10.5)

## 2012-12-17 LAB — BASIC METABOLIC PANEL
BUN: 29 mg/dL — ABNORMAL HIGH (ref 6–23)
CO2: 29 mEq/L (ref 19–32)
Calcium: 8.6 mg/dL (ref 8.4–10.5)
Chloride: 101 mEq/L (ref 96–112)
Creatinine, Ser: 1.17 mg/dL — ABNORMAL HIGH (ref 0.50–1.10)
Glucose, Bld: 109 mg/dL — ABNORMAL HIGH (ref 70–99)

## 2012-12-17 NOTE — Progress Notes (Signed)
VASCULAR LAB PRELIMINARY  PRELIMINARY  PRELIMINARY  PRELIMINARY  Right lower extremity venous Doppler completed.    Preliminary report:  There is partially occlusive DVT noted in the right common femoral vein, all other veins appear thrombus free.  No propagation noted to the left lower extremity.  Lupita Rosales, RVT 12/17/2012, 6:13 PM

## 2012-12-18 LAB — CBC
HCT: 24.1 % — ABNORMAL LOW (ref 36.0–46.0)
MCH: 32.8 pg (ref 26.0–34.0)
MCV: 94.1 fL (ref 78.0–100.0)
RDW: 17.3 % — ABNORMAL HIGH (ref 11.5–15.5)
WBC: 7 10*3/uL (ref 4.0–10.5)

## 2012-12-19 LAB — COMPREHENSIVE METABOLIC PANEL
ALT: 16 U/L (ref 0–35)
Albumin: 2 g/dL — ABNORMAL LOW (ref 3.5–5.2)
BUN: 29 mg/dL — ABNORMAL HIGH (ref 6–23)
Calcium: 8.7 mg/dL (ref 8.4–10.5)
GFR calc Af Amer: 49 mL/min — ABNORMAL LOW (ref >90)
Glucose, Bld: 343 mg/dL — ABNORMAL HIGH (ref 70–99)
Sodium: 136 mEq/L (ref 135–145)
Total Protein: 5.6 g/dL — ABNORMAL LOW (ref 6.0–8.3)

## 2012-12-19 LAB — CBC
Hemoglobin: 8.5 g/dL — ABNORMAL LOW (ref 12.0–15.0)
MCH: 32.8 pg (ref 26.0–34.0)
MCHC: 34.7 g/dL (ref 30.0–36.0)
RDW: 17.6 % — ABNORMAL HIGH (ref 11.5–15.5)

## 2012-12-19 LAB — PREALBUMIN: Prealbumin: 11.9 mg/dL — ABNORMAL LOW (ref 17.0–34.0)

## 2012-12-19 NOTE — Telephone Encounter (Signed)
Spoke with patient's daughter and she will call when patient is discharged and schedule and appt with you. She said patient is still pretty much total care and she is unsure when discharge is planned.

## 2012-12-20 LAB — CBC
Platelets: 117 10*3/uL — ABNORMAL LOW (ref 150–400)
RDW: 17.4 % — ABNORMAL HIGH (ref 11.5–15.5)
WBC: 5.4 10*3/uL (ref 4.0–10.5)

## 2012-12-20 LAB — PROTIME-INR: INR: 1.19 (ref 0.00–1.49)

## 2012-12-21 LAB — BASIC METABOLIC PANEL
Chloride: 99 mEq/L (ref 96–112)
GFR calc Af Amer: 42 mL/min — ABNORMAL LOW (ref >90)
Potassium: 4.2 mEq/L (ref 3.5–5.1)
Sodium: 136 mEq/L (ref 135–145)

## 2012-12-21 LAB — CBC
Platelets: 120 10*3/uL — ABNORMAL LOW (ref 150–400)
RDW: 17.6 % — ABNORMAL HIGH (ref 11.5–15.5)
WBC: 5.4 10*3/uL (ref 4.0–10.5)

## 2012-12-21 LAB — PROTIME-INR: INR: 1.09 (ref 0.00–1.49)

## 2012-12-22 LAB — PROTIME-INR: Prothrombin Time: 14 seconds (ref 11.6–15.2)

## 2012-12-22 LAB — BASIC METABOLIC PANEL
BUN: 38 mg/dL — ABNORMAL HIGH (ref 6–23)
GFR calc Af Amer: 42 mL/min — ABNORMAL LOW (ref >90)
GFR calc non Af Amer: 36 mL/min — ABNORMAL LOW (ref >90)
Potassium: 4.1 mEq/L (ref 3.5–5.1)
Sodium: 135 mEq/L (ref 135–145)

## 2012-12-23 LAB — PROTIME-INR
INR: 1.28 (ref 0.00–1.49)
Prothrombin Time: 15.7 seconds — ABNORMAL HIGH (ref 11.6–15.2)

## 2012-12-23 LAB — CBC
Hemoglobin: 8.5 g/dL — ABNORMAL LOW (ref 12.0–15.0)
RBC: 2.63 MIL/uL — ABNORMAL LOW (ref 3.87–5.11)
WBC: 4.1 10*3/uL (ref 4.0–10.5)

## 2012-12-23 LAB — BASIC METABOLIC PANEL
CO2: 29 mEq/L (ref 19–32)
Glucose, Bld: 185 mg/dL — ABNORMAL HIGH (ref 70–99)
Potassium: 4.1 mEq/L (ref 3.5–5.1)
Sodium: 139 mEq/L (ref 135–145)

## 2012-12-24 LAB — PROTIME-INR
INR: 1.61 — ABNORMAL HIGH (ref 0.00–1.49)
Prothrombin Time: 18.7 seconds — ABNORMAL HIGH (ref 11.6–15.2)

## 2012-12-25 LAB — BASIC METABOLIC PANEL
Chloride: 105 mEq/L (ref 96–112)
Creatinine, Ser: 1.14 mg/dL — ABNORMAL HIGH (ref 0.50–1.10)
GFR calc Af Amer: 53 mL/min — ABNORMAL LOW (ref >90)
GFR calc non Af Amer: 46 mL/min — ABNORMAL LOW (ref >90)
Potassium: 4.2 mEq/L (ref 3.5–5.1)

## 2012-12-25 LAB — CBC
MCV: 95.8 fL (ref 78.0–100.0)
Platelets: 94 10*3/uL — ABNORMAL LOW (ref 150–400)
RDW: 17.6 % — ABNORMAL HIGH (ref 11.5–15.5)
WBC: 4.6 10*3/uL (ref 4.0–10.5)

## 2012-12-25 LAB — PROTIME-INR: INR: 1.89 — ABNORMAL HIGH (ref 0.00–1.49)

## 2012-12-26 LAB — PROTIME-INR: Prothrombin Time: 22.1 seconds — ABNORMAL HIGH (ref 11.6–15.2)

## 2012-12-27 LAB — CBC
HCT: 26 % — ABNORMAL LOW (ref 36.0–46.0)
MCH: 32.1 pg (ref 26.0–34.0)
MCHC: 33.5 g/dL (ref 30.0–36.0)
RDW: 17.5 % — ABNORMAL HIGH (ref 11.5–15.5)

## 2012-12-27 LAB — BASIC METABOLIC PANEL
BUN: 35 mg/dL — ABNORMAL HIGH (ref 6–23)
Calcium: 9 mg/dL (ref 8.4–10.5)
Creatinine, Ser: 1.11 mg/dL — ABNORMAL HIGH (ref 0.50–1.10)
GFR calc Af Amer: 55 mL/min — ABNORMAL LOW (ref >90)
GFR calc non Af Amer: 47 mL/min — ABNORMAL LOW (ref >90)
Potassium: 4.4 mEq/L (ref 3.5–5.1)

## 2012-12-27 LAB — PROTIME-INR: INR: 2.53 — ABNORMAL HIGH (ref 0.00–1.49)

## 2013-01-27 LAB — COMPREHENSIVE METABOLIC PANEL
Albumin: 3.3 g/dL — ABNORMAL LOW (ref 3.4–5.0)
Calcium, Total: 9.2 mg/dL (ref 8.5–10.1)
Chloride: 109 mmol/L — ABNORMAL HIGH (ref 98–107)
EGFR (Non-African Amer.): 48 — ABNORMAL LOW
Glucose: 314 mg/dL — ABNORMAL HIGH (ref 65–99)
Osmolality: 288 (ref 275–301)
Potassium: 4.1 mmol/L (ref 3.5–5.1)
SGOT(AST): 31 U/L (ref 15–37)

## 2013-01-27 LAB — URINALYSIS, COMPLETE
Ketone: NEGATIVE
Leukocyte Esterase: NEGATIVE
RBC,UR: 3 /HPF (ref 0–5)
Squamous Epithelial: NONE SEEN
WBC UR: 1 /HPF (ref 0–5)

## 2013-01-27 LAB — CBC
MCH: 33.6 pg (ref 26.0–34.0)
Platelet: 117 10*3/uL — ABNORMAL LOW (ref 150–440)
WBC: 6 10*3/uL (ref 3.6–11.0)

## 2013-01-27 LAB — TROPONIN I
Troponin-I: 0.07 ng/mL — ABNORMAL HIGH
Troponin-I: 0.07 ng/mL — ABNORMAL HIGH

## 2013-01-28 LAB — CBC WITH DIFFERENTIAL/PLATELET
Basophil #: 0 10*3/uL (ref 0.0–0.1)
Eosinophil %: 2.4 %
HGB: 10.8 g/dL — ABNORMAL LOW (ref 12.0–16.0)
Lymphocyte %: 44.1 %
MCHC: 35 g/dL (ref 32.0–36.0)
MCV: 95 fL (ref 80–100)
Monocyte %: 6.2 %
Neutrophil #: 2.5 10*3/uL (ref 1.4–6.5)
Platelet: 93 10*3/uL — ABNORMAL LOW (ref 150–440)
RBC: 3.25 10*6/uL — ABNORMAL LOW (ref 3.80–5.20)
RDW: 14.3 % (ref 11.5–14.5)
WBC: 5.3 10*3/uL (ref 3.6–11.0)

## 2013-01-28 LAB — BASIC METABOLIC PANEL
BUN: 19 mg/dL — ABNORMAL HIGH (ref 7–18)
Calcium, Total: 9 mg/dL (ref 8.5–10.1)
Co2: 24 mmol/L (ref 21–32)
Creatinine: 1.19 mg/dL (ref 0.60–1.30)
EGFR (African American): 52 — ABNORMAL LOW
EGFR (Non-African Amer.): 45 — ABNORMAL LOW
Glucose: 163 mg/dL — ABNORMAL HIGH (ref 65–99)
Osmolality: 291 (ref 275–301)
Potassium: 4 mmol/L (ref 3.5–5.1)
Sodium: 143 mmol/L (ref 136–145)

## 2013-01-30 ENCOUNTER — Inpatient Hospital Stay: Payer: Self-pay | Admitting: Internal Medicine

## 2013-01-30 LAB — URINE CULTURE

## 2013-02-12 ENCOUNTER — Inpatient Hospital Stay: Payer: Self-pay | Admitting: Internal Medicine

## 2013-02-12 LAB — TROPONIN I: Troponin-I: 0.02 ng/mL

## 2013-02-12 LAB — CBC
HCT: 29 % — ABNORMAL LOW (ref 35.0–47.0)
HGB: 10.2 g/dL — ABNORMAL LOW (ref 12.0–16.0)
MCH: 33.1 pg (ref 26.0–34.0)
MCV: 95 fL (ref 80–100)
Platelet: 103 10*3/uL — ABNORMAL LOW (ref 150–440)
RDW: 13.7 % (ref 11.5–14.5)
WBC: 8 10*3/uL (ref 3.6–11.0)

## 2013-02-12 LAB — COMPREHENSIVE METABOLIC PANEL
Bilirubin,Total: 0.8 mg/dL (ref 0.2–1.0)
Chloride: 111 mmol/L — ABNORMAL HIGH (ref 98–107)
Co2: 21 mmol/L (ref 21–32)
Creatinine: 1.56 mg/dL — ABNORMAL HIGH (ref 0.60–1.30)
Osmolality: 295 (ref 275–301)
Potassium: 4.1 mmol/L (ref 3.5–5.1)
Sodium: 142 mmol/L (ref 136–145)
Total Protein: 6.7 g/dL (ref 6.4–8.2)

## 2013-02-12 LAB — URINALYSIS, COMPLETE
Glucose,UR: 100 mg/dL (ref 0–75)
Ketone: NEGATIVE
Nitrite: NEGATIVE
Protein: 25
RBC,UR: 9 /HPF (ref 0–5)
Specific Gravity: 1.015 (ref 1.003–1.030)

## 2013-02-13 LAB — CBC WITH DIFFERENTIAL/PLATELET
Basophil %: 0.4 %
Lymphocyte %: 27 %
MCHC: 34.8 g/dL (ref 32.0–36.0)
Neutrophil #: 3.2 10*3/uL (ref 1.4–6.5)
Neutrophil %: 59.6 %
Platelet: 87 10*3/uL — ABNORMAL LOW (ref 150–440)
RDW: 13.6 % (ref 11.5–14.5)
WBC: 5.4 10*3/uL (ref 3.6–11.0)

## 2013-02-13 LAB — BASIC METABOLIC PANEL
Anion Gap: 8 (ref 7–16)
BUN: 21 mg/dL — ABNORMAL HIGH (ref 7–18)
Chloride: 115 mmol/L — ABNORMAL HIGH (ref 98–107)
Co2: 22 mmol/L (ref 21–32)
Creatinine: 1.47 mg/dL — ABNORMAL HIGH (ref 0.60–1.30)
EGFR (African American): 40 — ABNORMAL LOW
EGFR (Non-African Amer.): 35 — ABNORMAL LOW
Glucose: 152 mg/dL — ABNORMAL HIGH (ref 65–99)
Sodium: 145 mmol/L (ref 136–145)

## 2013-02-14 LAB — URINE CULTURE

## 2013-04-11 LAB — URINALYSIS, COMPLETE
Glucose,UR: >=500 mg/dL (ref 0–75)
RBC,UR: 5 /HPF (ref 0–5)
Squamous Epithelial: 3

## 2013-04-11 LAB — COMPREHENSIVE METABOLIC PANEL
Albumin: 3.3 g/dL — ABNORMAL LOW (ref 3.4–5.0)
Anion Gap: 4 — ABNORMAL LOW (ref 7–16)
BUN: 18 mg/dL (ref 7–18)
Calcium, Total: 9.5 mg/dL (ref 8.5–10.1)
EGFR (African American): 44 — ABNORMAL LOW
Potassium: 4 mmol/L (ref 3.5–5.1)
SGOT(AST): 29 U/L (ref 15–37)
Sodium: 137 mmol/L (ref 136–145)

## 2013-04-11 LAB — CBC
HCT: 30.9 % — ABNORMAL LOW (ref 35.0–47.0)
MCH: 31.8 pg (ref 26.0–34.0)
MCHC: 34.8 g/dL (ref 32.0–36.0)
Platelet: 72 10*3/uL — ABNORMAL LOW (ref 150–440)
RBC: 3.38 10*6/uL — ABNORMAL LOW (ref 3.80–5.20)
RDW: 13.6 % (ref 11.5–14.5)
WBC: 3.6 10*3/uL (ref 3.6–11.0)

## 2013-04-12 ENCOUNTER — Inpatient Hospital Stay: Payer: Self-pay | Admitting: Internal Medicine

## 2013-04-12 LAB — HEMOGLOBIN A1C: Hemoglobin A1C: 6.9 % — ABNORMAL HIGH (ref 4.2–6.3)

## 2013-04-13 LAB — BASIC METABOLIC PANEL
Anion Gap: 6 — ABNORMAL LOW (ref 7–16)
Calcium, Total: 9.4 mg/dL (ref 8.5–10.1)
Chloride: 108 mmol/L — ABNORMAL HIGH (ref 98–107)
Co2: 27 mmol/L (ref 21–32)
EGFR (African American): 59 — ABNORMAL LOW
EGFR (Non-African Amer.): 51 — ABNORMAL LOW
Glucose: 113 mg/dL — ABNORMAL HIGH (ref 65–99)
Osmolality: 283 (ref 275–301)
Potassium: 3.6 mmol/L (ref 3.5–5.1)
Sodium: 141 mmol/L (ref 136–145)

## 2013-04-13 LAB — CBC WITH DIFFERENTIAL/PLATELET
Eosinophil #: 0.4 10*3/uL (ref 0.0–0.7)
HCT: 28.1 % — ABNORMAL LOW (ref 35.0–47.0)
Lymphocyte %: 39.7 %
MCHC: 35.4 g/dL (ref 32.0–36.0)
MCV: 89 fL (ref 80–100)
Monocyte #: 0.3 x10 3/mm (ref 0.2–0.9)
Monocyte %: 8.3 %
Neutrophil #: 1.6 10*3/uL (ref 1.4–6.5)
Platelet: 75 10*3/uL — ABNORMAL LOW (ref 150–440)
RBC: 3.15 10*6/uL — ABNORMAL LOW (ref 3.80–5.20)
RDW: 13.4 % (ref 11.5–14.5)

## 2013-04-13 LAB — URINE CULTURE

## 2013-04-16 LAB — CULTURE, BLOOD (SINGLE)

## 2013-05-19 LAB — COMPREHENSIVE METABOLIC PANEL
Alkaline Phosphatase: 62 U/L
Anion Gap: 9 (ref 7–16)
Bilirubin,Total: 0.8 mg/dL (ref 0.2–1.0)
Calcium, Total: 10.3 mg/dL — ABNORMAL HIGH (ref 8.5–10.1)
Chloride: 98 mmol/L (ref 98–107)
Co2: 24 mmol/L (ref 21–32)
Creatinine: 1.37 mg/dL — ABNORMAL HIGH (ref 0.60–1.30)
EGFR (Non-African Amer.): 37 — ABNORMAL LOW
Glucose: 494 mg/dL — ABNORMAL HIGH (ref 65–99)
Osmolality: 289 (ref 275–301)
Potassium: 4.6 mmol/L (ref 3.5–5.1)
SGPT (ALT): 16 U/L (ref 12–78)
Total Protein: 7.8 g/dL (ref 6.4–8.2)

## 2013-05-19 LAB — CBC
HCT: 34 % — ABNORMAL LOW (ref 35.0–47.0)
HGB: 11.8 g/dL — ABNORMAL LOW (ref 12.0–16.0)
MCH: 31.3 pg (ref 26.0–34.0)
RBC: 3.78 10*6/uL — ABNORMAL LOW (ref 3.80–5.20)
RDW: 14 % (ref 11.5–14.5)

## 2013-05-20 ENCOUNTER — Inpatient Hospital Stay: Payer: Self-pay | Admitting: Internal Medicine

## 2013-05-20 LAB — URINALYSIS, COMPLETE
Glucose,UR: >=500 mg/dL (ref 0–75)
Leukocyte Esterase: NEGATIVE
Nitrite: NEGATIVE
Protein: NEGATIVE
RBC,UR: 1 /HPF (ref 0–5)
Specific Gravity: 1.021 (ref 1.003–1.030)
Squamous Epithelial: <1

## 2013-05-21 LAB — CBC WITH DIFFERENTIAL/PLATELET
Basophil #: 0 10*3/uL (ref 0.0–0.1)
Basophil %: 0.6 %
Eosinophil #: 0.2 10*3/uL (ref 0.0–0.7)
HCT: 28.4 % — ABNORMAL LOW (ref 35.0–47.0)
HGB: 10.2 g/dL — ABNORMAL LOW (ref 12.0–16.0)
MCHC: 35.7 g/dL (ref 32.0–36.0)
MCV: 88 fL (ref 80–100)
Monocyte %: 8.8 %
Neutrophil #: 1.3 10*3/uL — ABNORMAL LOW (ref 1.4–6.5)
Neutrophil %: 43 %
Platelet: 48 10*3/uL — ABNORMAL LOW (ref 150–440)
RBC: 3.23 10*6/uL — ABNORMAL LOW (ref 3.80–5.20)
RDW: 13.9 % (ref 11.5–14.5)
WBC: 3 10*3/uL — ABNORMAL LOW (ref 3.6–11.0)

## 2013-05-21 LAB — BASIC METABOLIC PANEL
Anion Gap: 9 (ref 7–16)
EGFR (African American): >60
EGFR (Non-African Amer.): 54 — ABNORMAL LOW
Glucose: 307 mg/dL — ABNORMAL HIGH (ref 65–99)
Osmolality: 287 (ref 275–301)
Potassium: 3.9 mmol/L (ref 3.5–5.1)

## 2013-05-21 LAB — T4, FREE: Free Thyroxine: 1.32 ng/dL (ref 0.76–1.46)

## 2013-05-24 ENCOUNTER — Emergency Department: Payer: Self-pay | Admitting: Emergency Medicine

## 2013-05-24 LAB — URINALYSIS, COMPLETE
Bilirubin,UR: NEGATIVE
Hyaline Cast: 1
Ketone: NEGATIVE
Nitrite: NEGATIVE
Ph: 5 (ref 4.5–8.0)
Protein: 30
Specific Gravity: 1.017 (ref 1.003–1.030)
Squamous Epithelial: NONE SEEN
WBC UR: 49 /HPF (ref 0–5)

## 2013-05-24 LAB — CBC
HGB: 10.4 g/dL — ABNORMAL LOW (ref 12.0–16.0)
MCHC: 34.8 g/dL (ref 32.0–36.0)
MCV: 90 fL (ref 80–100)
RBC: 3.32 10*6/uL — ABNORMAL LOW (ref 3.80–5.20)

## 2013-05-24 LAB — COMPREHENSIVE METABOLIC PANEL
Anion Gap: 7 (ref 7–16)
BUN: 16 mg/dL (ref 7–18)
Bilirubin,Total: 0.8 mg/dL (ref 0.2–1.0)
Calcium, Total: 9.1 mg/dL (ref 8.5–10.1)
Chloride: 106 mmol/L (ref 98–107)
Creatinine: 1.18 mg/dL (ref 0.60–1.30)
EGFR (Non-African Amer.): 45 — ABNORMAL LOW
Glucose: 373 mg/dL — ABNORMAL HIGH (ref 65–99)
SGOT(AST): 54 U/L — ABNORMAL HIGH (ref 15–37)
SGPT (ALT): 23 U/L (ref 12–78)
Sodium: 138 mmol/L (ref 136–145)

## 2013-05-24 LAB — TROPONIN I: Troponin-I: <0.02 ng/mL

## 2013-05-25 LAB — CULTURE, BLOOD (SINGLE)

## 2013-08-07 ENCOUNTER — Encounter: Payer: Self-pay | Admitting: Podiatry

## 2013-08-07 ENCOUNTER — Ambulatory Visit (INDEPENDENT_AMBULATORY_CARE_PROVIDER_SITE_OTHER): Payer: Medicare Other | Admitting: Podiatry

## 2013-08-07 VITALS — BP 130/88 | HR 81 | Resp 18 | Ht 60.0 in | Wt 111.0 lb

## 2013-08-07 DIAGNOSIS — M7750 Other enthesopathy of unspecified foot: Secondary | ICD-10-CM

## 2013-08-07 DIAGNOSIS — M778 Other enthesopathies, not elsewhere classified: Secondary | ICD-10-CM

## 2013-08-07 DIAGNOSIS — B353 Tinea pedis: Secondary | ICD-10-CM

## 2013-08-07 DIAGNOSIS — M79609 Pain in unspecified limb: Secondary | ICD-10-CM

## 2013-08-07 DIAGNOSIS — M779 Enthesopathy, unspecified: Secondary | ICD-10-CM

## 2013-08-07 NOTE — Progress Notes (Signed)
Trim toenails in my feet hurt in here she points to the plantar aspect of the second metatarsophalangeal joints bilateral. She does have a history of diabetes as well as diabetic peripheral neuropathy.  Objective: Vital signs are stable she is alert and oriented x3. Pulses are palpable bilateral. Decrease in sensorium per since once the monofilament bilateral. Hammertoe deformities are noted bilateral mild HAV deformities are noted bilateral. She has pain on palpation and in range of motion of the second metatarsophalangeal joints bilateral. Her toenails are thick yellow dystrophic lytic mycotic and painful palpation.  Assessment: Pain in limb secondary to onychomycosis and diabetic peripheral neuropathy with hammertoe deformities.  Plan: Injected the second metatarsophalangeal joints bilateral today with Kenalog and local anesthetic debridement nails 1 through 5 bilateral is cover service secondary to pain. She was measured today for  Shoes.

## 2013-08-10 ENCOUNTER — Emergency Department: Payer: Self-pay | Admitting: Emergency Medicine

## 2013-08-10 LAB — COMPREHENSIVE METABOLIC PANEL
ALK PHOS: 76 U/L
AST: 23 U/L (ref 15–37)
Albumin: 3.5 g/dL (ref 3.4–5.0)
Anion Gap: 11 (ref 7–16)
BUN: 24 mg/dL — AB (ref 7–18)
Bilirubin,Total: 0.8 mg/dL (ref 0.2–1.0)
Calcium, Total: 9.4 mg/dL (ref 8.5–10.1)
Chloride: 104 mmol/L (ref 98–107)
Co2: 21 mmol/L (ref 21–32)
Creatinine: 1.07 mg/dL (ref 0.60–1.30)
EGFR (African American): 58 — ABNORMAL LOW
GFR CALC NON AF AMER: 50 — AB
GLUCOSE: 404 mg/dL — AB (ref 65–99)
Osmolality: 293 (ref 275–301)
Potassium: 3.7 mmol/L (ref 3.5–5.1)
SGPT (ALT): 14 U/L (ref 12–78)
SODIUM: 136 mmol/L (ref 136–145)
Total Protein: 7.3 g/dL (ref 6.4–8.2)

## 2013-08-10 LAB — CBC
HCT: 30.3 % — ABNORMAL LOW (ref 35.0–47.0)
HGB: 10.3 g/dL — ABNORMAL LOW (ref 12.0–16.0)
MCH: 31.9 pg (ref 26.0–34.0)
MCHC: 34.1 g/dL (ref 32.0–36.0)
MCV: 94 fL (ref 80–100)
Platelet: 71 10*3/uL — ABNORMAL LOW (ref 150–440)
RBC: 3.24 10*6/uL — ABNORMAL LOW (ref 3.80–5.20)
RDW: 15.6 % — ABNORMAL HIGH (ref 11.5–14.5)
WBC: 3.5 10*3/uL — AB (ref 3.6–11.0)

## 2013-08-10 LAB — BETA-HYDROXYBUTYRIC ACID: Beta-Hydroxybutyrate: 37.1 mg/dL — ABNORMAL HIGH (ref 0.2–2.8)

## 2013-08-10 LAB — URINALYSIS, COMPLETE
BACTERIA: NONE SEEN
Bilirubin,UR: NEGATIVE
Glucose,UR: >=500 mg/dL (ref 0–75)
Hyaline Cast: 2
LEUKOCYTE ESTERASE: NEGATIVE
Nitrite: NEGATIVE
Ph: 5 (ref 4.5–8.0)
Protein: 30
RBC,UR: 3 /HPF (ref 0–5)
SPECIFIC GRAVITY: 1.021 (ref 1.003–1.030)
WBC UR: 14 /HPF (ref 0–5)

## 2013-08-10 LAB — TROPONIN I: Troponin-I: 0.02 ng/mL

## 2013-08-10 LAB — LIPASE, BLOOD: LIPASE: 251 U/L (ref 73–393)

## 2013-08-12 LAB — URINE CULTURE

## 2013-08-28 ENCOUNTER — Telehealth: Payer: Self-pay | Admitting: Podiatry

## 2013-08-28 NOTE — Telephone Encounter (Signed)
PT SON, [name] CHECKING ON STATUS OF DIABETIC SHOES FOR MOTHER AND HIMSELF

## 2013-08-31 ENCOUNTER — Telehealth: Payer: Self-pay | Admitting: *Deleted

## 2013-08-31 NOTE — Telephone Encounter (Signed)
Called and asked for Gina Andersen he was unavailable per his sister Gina Andersen. [facility] Spaniel[facility]. i left message asking for Gina Andersen to call back to schedule an appt. For his mother to be measured for diabetic shoes.

## 2013-09-13 ENCOUNTER — Emergency Department: Payer: Self-pay

## 2013-09-23 ENCOUNTER — Emergency Department: Payer: Self-pay | Admitting: Emergency Medicine

## 2013-09-23 LAB — URINALYSIS, COMPLETE
Bilirubin,UR: NEGATIVE
Hyaline Cast: 2
Nitrite: NEGATIVE
Ph: 5 (ref 4.5–8.0)
Protein: 100
RBC,UR: 4 /HPF (ref 0–5)
SQUAMOUS EPITHELIAL: NONE SEEN
Specific Gravity: 1.016 (ref 1.003–1.030)
WBC UR: 90 /HPF (ref 0–5)

## 2013-09-23 LAB — COMPREHENSIVE METABOLIC PANEL
ALBUMIN: 3.2 g/dL — AB (ref 3.4–5.0)
ALK PHOS: 88 U/L
AST: 27 U/L (ref 15–37)
Anion Gap: 10 (ref 7–16)
BUN: 27 mg/dL — ABNORMAL HIGH (ref 7–18)
Bilirubin,Total: 1.1 mg/dL — ABNORMAL HIGH (ref 0.2–1.0)
CALCIUM: 9.3 mg/dL (ref 8.5–10.1)
CHLORIDE: 107 mmol/L (ref 98–107)
CO2: 24 mmol/L (ref 21–32)
CREATININE: 0.87 mg/dL (ref 0.60–1.30)
EGFR (African American): >60
EGFR (Non-African Amer.): >60
Glucose: 312 mg/dL — ABNORMAL HIGH (ref 65–99)
Osmolality: 298 (ref 275–301)
POTASSIUM: 3.9 mmol/L (ref 3.5–5.1)
SGPT (ALT): 17 U/L (ref 12–78)
Sodium: 141 mmol/L (ref 136–145)
TOTAL PROTEIN: 6.9 g/dL (ref 6.4–8.2)

## 2013-09-23 LAB — CBC
HCT: 30.2 % — ABNORMAL LOW (ref 35.0–47.0)
HGB: 10.4 g/dL — ABNORMAL LOW (ref 12.0–16.0)
MCH: 33.2 pg (ref 26.0–34.0)
MCHC: 34.5 g/dL (ref 32.0–36.0)
MCV: 96 fL (ref 80–100)
Platelet: 89 10*3/uL — ABNORMAL LOW (ref 150–440)
RBC: 3.13 10*6/uL — ABNORMAL LOW (ref 3.80–5.20)
RDW: 14.9 % — AB (ref 11.5–14.5)
WBC: 4 10*3/uL (ref 3.6–11.0)

## 2013-09-23 LAB — LIPASE, BLOOD: LIPASE: 189 U/L (ref 73–393)

## 2013-09-23 LAB — TROPONIN I: Troponin-I: 0.02 ng/mL

## 2013-09-25 ENCOUNTER — Emergency Department: Payer: Self-pay | Admitting: Emergency Medicine

## 2013-09-25 LAB — CBC
HCT: 28.9 % — AB (ref 35.0–47.0)
HGB: 9.8 g/dL — ABNORMAL LOW (ref 12.0–16.0)
MCH: 32.6 pg (ref 26.0–34.0)
MCHC: 33.8 g/dL (ref 32.0–36.0)
MCV: 97 fL (ref 80–100)
Platelet: 77 10*3/uL — ABNORMAL LOW (ref 150–440)
RBC: 2.99 10*6/uL — ABNORMAL LOW (ref 3.80–5.20)
RDW: 15 % — ABNORMAL HIGH (ref 11.5–14.5)
WBC: 4.3 10*3/uL (ref 3.6–11.0)

## 2013-09-25 LAB — BASIC METABOLIC PANEL
Anion Gap: 7 (ref 7–16)
BUN: 16 mg/dL (ref 7–18)
CALCIUM: 9.1 mg/dL (ref 8.5–10.1)
CO2: 24 mmol/L (ref 21–32)
CREATININE: 0.94 mg/dL (ref 0.60–1.30)
Chloride: 108 mmol/L — ABNORMAL HIGH (ref 98–107)
EGFR (African American): >60
GFR CALC NON AF AMER: 59 — AB
Glucose: 294 mg/dL — ABNORMAL HIGH (ref 65–99)
OSMOLALITY: 290 (ref 275–301)
Potassium: 3.7 mmol/L (ref 3.5–5.1)
SODIUM: 139 mmol/L (ref 136–145)

## 2013-09-25 LAB — URINALYSIS, COMPLETE
Bacteria: NONE SEEN
Bilirubin,UR: NEGATIVE
Glucose,UR: >=500 mg/dL (ref 0–75)
Ketone: NEGATIVE
Nitrite: NEGATIVE
Ph: 5 (ref 4.5–8.0)
Protein: 30
RBC,UR: 1 /HPF (ref 0–5)
SPECIFIC GRAVITY: 1.007 (ref 1.003–1.030)
Squamous Epithelial: <1
WBC UR: 16 /HPF (ref 0–5)

## 2013-09-25 LAB — URINE CULTURE

## 2013-09-26 ENCOUNTER — Emergency Department: Payer: Self-pay | Admitting: Emergency Medicine

## 2013-10-09 ENCOUNTER — Ambulatory Visit: Payer: Medicare Other | Admitting: Podiatry

## 2013-10-30 ENCOUNTER — Other Ambulatory Visit: Payer: Self-pay | Admitting: Family Medicine

## 2014-02-07 ENCOUNTER — Ambulatory Visit: Payer: Medicare Other | Admitting: Podiatry

## 2014-02-08 ENCOUNTER — Ambulatory Visit (INDEPENDENT_AMBULATORY_CARE_PROVIDER_SITE_OTHER): Payer: Medicare Other | Admitting: Podiatry

## 2014-02-08 DIAGNOSIS — M778 Other enthesopathies, not elsewhere classified: Secondary | ICD-10-CM

## 2014-02-08 DIAGNOSIS — M79676 Pain in unspecified toe(s): Secondary | ICD-10-CM

## 2014-02-08 DIAGNOSIS — M79609 Pain in unspecified limb: Secondary | ICD-10-CM

## 2014-02-08 DIAGNOSIS — M7750 Other enthesopathy of unspecified foot: Secondary | ICD-10-CM

## 2014-02-08 DIAGNOSIS — B351 Tinea unguium: Secondary | ICD-10-CM

## 2014-02-08 DIAGNOSIS — M779 Enthesopathy, unspecified: Secondary | ICD-10-CM

## 2014-02-08 DIAGNOSIS — B353 Tinea pedis: Secondary | ICD-10-CM

## 2014-02-08 NOTE — Progress Notes (Signed)
She presents today with a chief complaint of painful elongated toenails one through 5 bilateral. She's also complaining of painful second metatarsophalangeal joint bilateral. Should also like to see about getting a pair of diabetic shoes.  Objective: Vital signs are stable she is alert and oriented x3. Pulses are palpable bilateral. She has hammertoe deformities and diabetic peripheral neuropathy. She has pain on in range of motion of the second metatarsophalangeal joint bilateral. She also has pain on palpation of her elongated nails which are thick yellow dystrophic onychomycotic.  Assessment: Diabetes with diabetic peripheral neuropathy hammertoe deformity and capsulitis of the second metatarsophalangeal joint. Pain in limb secondary to onychomycosis bilateral.  Plan: Shoe size today for set of diabetic shoes. I injected dexamethasone about the second metatarsophalangeal joints bilaterally. Debridement nails 1 through 5 bilateral covered service secondary to pain.

## 2014-02-26 ENCOUNTER — Telehealth: Payer: Self-pay | Admitting: *Deleted

## 2014-02-26 NOTE — Telephone Encounter (Signed)
Mother's new doctor is Marletta LorJulie Barr.  Her phone number is [phone].  That's the person you need to send the papers to or call to get mother her Diabetic shoes.

## 2014-03-12 ENCOUNTER — Telehealth: Payer: Self-pay | Admitting: *Deleted

## 2014-03-12 NOTE — Telephone Encounter (Signed)
Spoke with Gina Andersen letting him know it may be 2 - 3 weeks before we get approval for his mothers shoes. Still waiting to here back from julie barr. Gina Andersen understood.

## 2014-03-26 ENCOUNTER — Encounter: Payer: Self-pay | Admitting: *Deleted

## 2014-03-26 NOTE — Progress Notes (Signed)
Spoke with terry regarding his mom, Gina Andersen. Shoes have been approved. Will order shoes asap. [name] understood.

## 2014-05-07 ENCOUNTER — Ambulatory Visit: Payer: Medicare Other | Admitting: Podiatry

## 2014-05-21 ENCOUNTER — Ambulatory Visit: Payer: Medicare Other | Admitting: Podiatry

## 2014-05-29 ENCOUNTER — Emergency Department: Payer: Self-pay | Admitting: Emergency Medicine

## 2014-05-29 LAB — URINALYSIS, COMPLETE
Bilirubin,UR: NEGATIVE
Glucose,UR: >=500 mg/dL (ref 0–75)
Nitrite: NEGATIVE
PH: 5 (ref 4.5–8.0)
Protein: 30
RBC,UR: 6 /HPF (ref 0–5)
Specific Gravity: 1.017 (ref 1.003–1.030)
Squamous Epithelial: NONE SEEN

## 2014-05-29 LAB — COMPREHENSIVE METABOLIC PANEL
ALBUMIN: 2.9 g/dL — AB (ref 3.4–5.0)
ALK PHOS: 58 U/L
Anion Gap: 8 (ref 7–16)
BUN: 14 mg/dL (ref 7–18)
Bilirubin,Total: 1 mg/dL (ref 0.2–1.0)
Calcium, Total: 9 mg/dL (ref 8.5–10.1)
Chloride: 106 mmol/L (ref 98–107)
Co2: 28 mmol/L (ref 21–32)
Creatinine: 0.99 mg/dL (ref 0.60–1.30)
EGFR (African American): >60
EGFR (Non-African Amer.): 58 — ABNORMAL LOW
GLUCOSE: 350 mg/dL — AB (ref 65–99)
Osmolality: 298 (ref 275–301)
POTASSIUM: 3.6 mmol/L (ref 3.5–5.1)
SGOT(AST): 38 U/L — ABNORMAL HIGH (ref 15–37)
SGPT (ALT): 18 U/L
SODIUM: 142 mmol/L (ref 136–145)
TOTAL PROTEIN: 6.5 g/dL (ref 6.4–8.2)

## 2014-05-30 LAB — URINE CULTURE

## 2014-07-09 IMAGING — CR DG KNEE COMPLETE 4+V*R*
1 series · 4 of 4 positions shown · non-contrast
Comparison: none

REASON FOR EXAM: trauma, pain
COMMENTS:

PROCEDURE:     DXR - DXR KNEE RT COMP WITH OBLIQUES  - February 29, 2012  [DATE]
RESULT:     Comparison: None.

[Series 1: ap · 0.17mm/px · 4 of 4 slices shown]
[im 1/4]
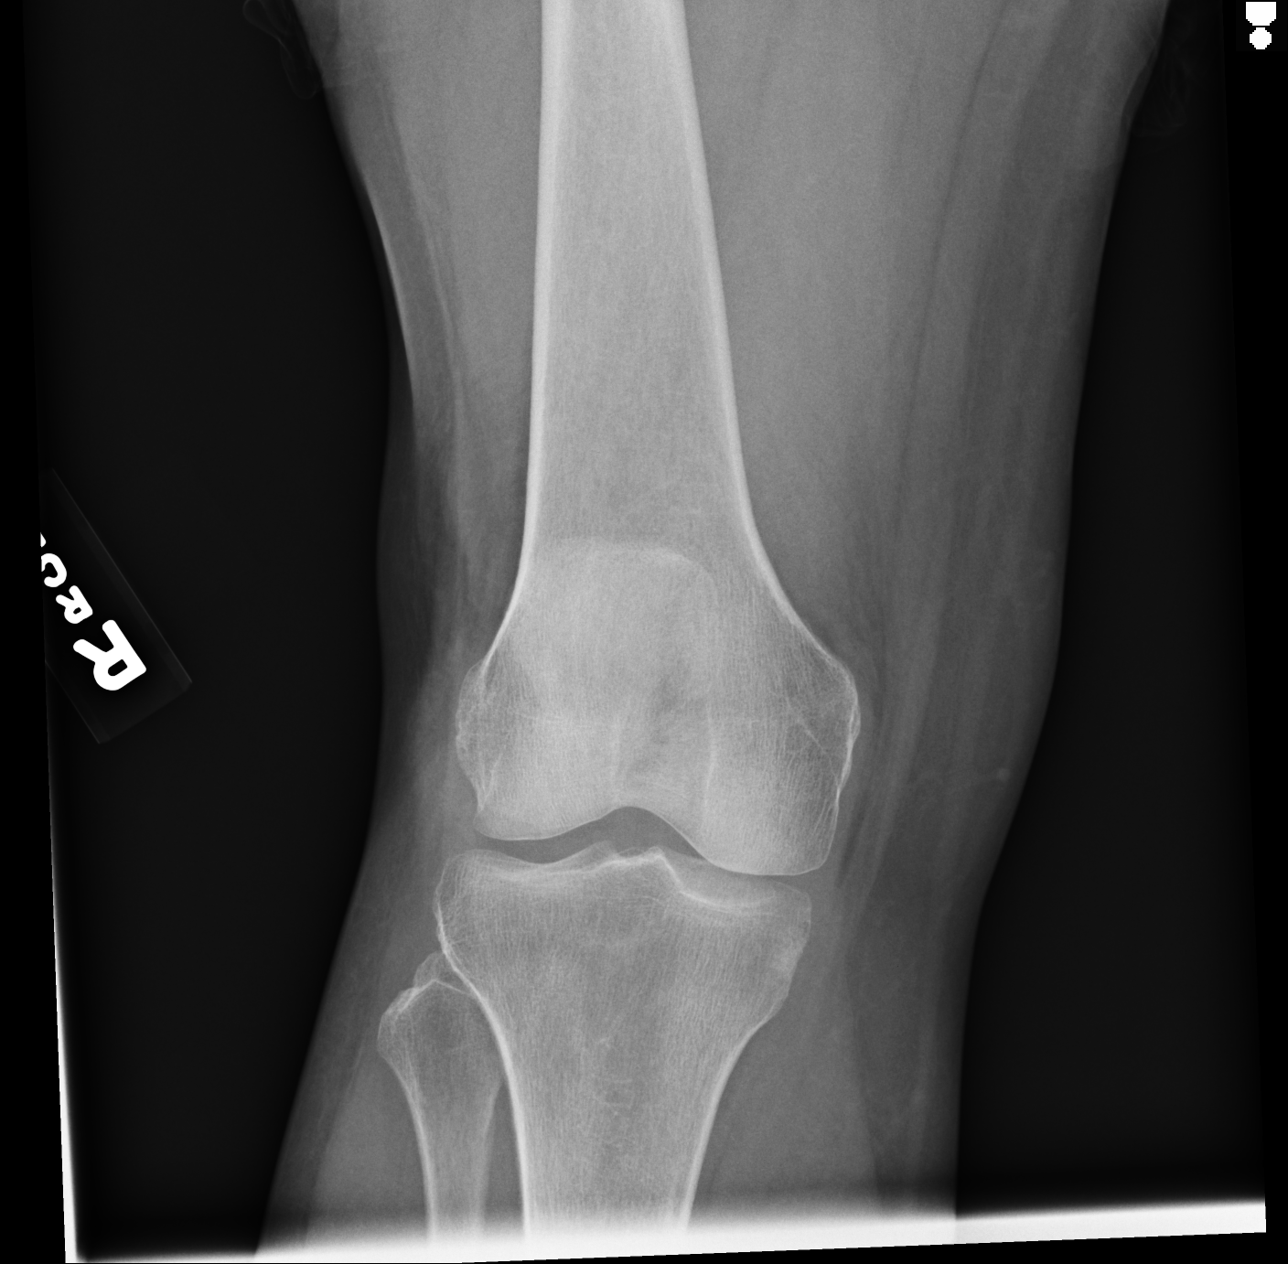
[im 2/4]
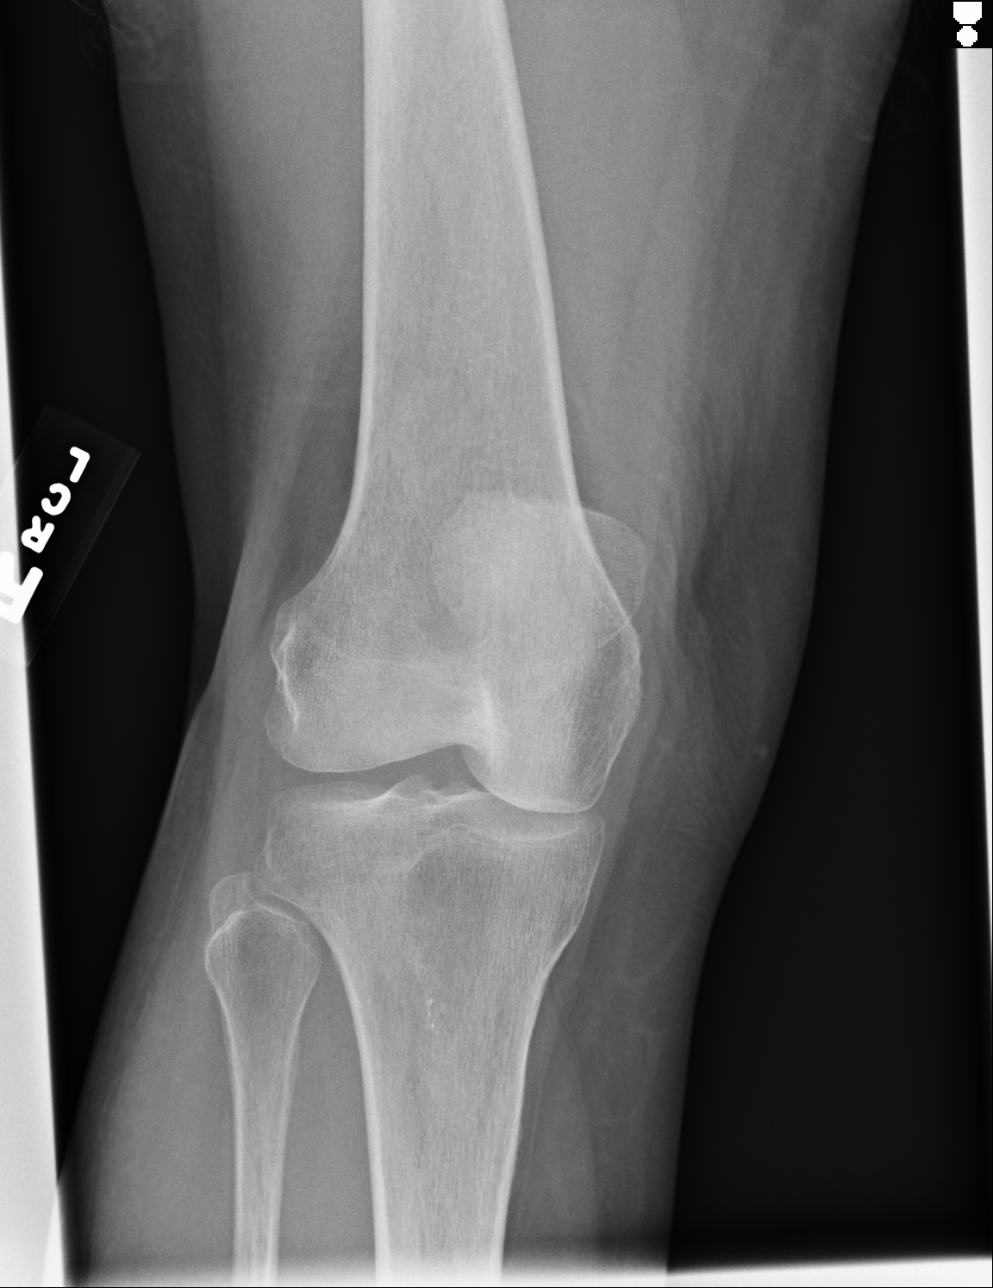
[im 3/4]
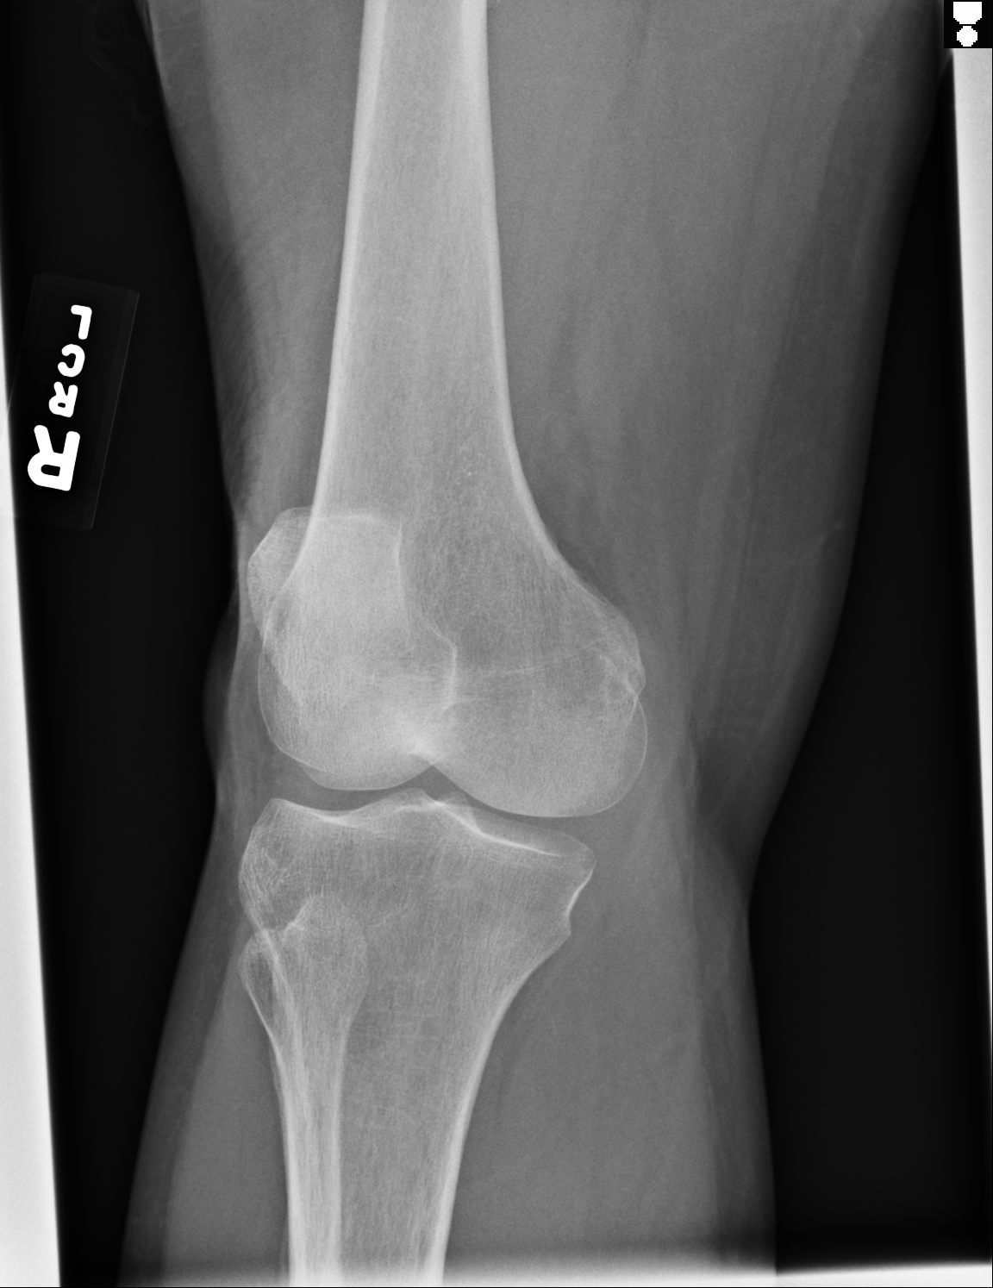
[im 4/4]
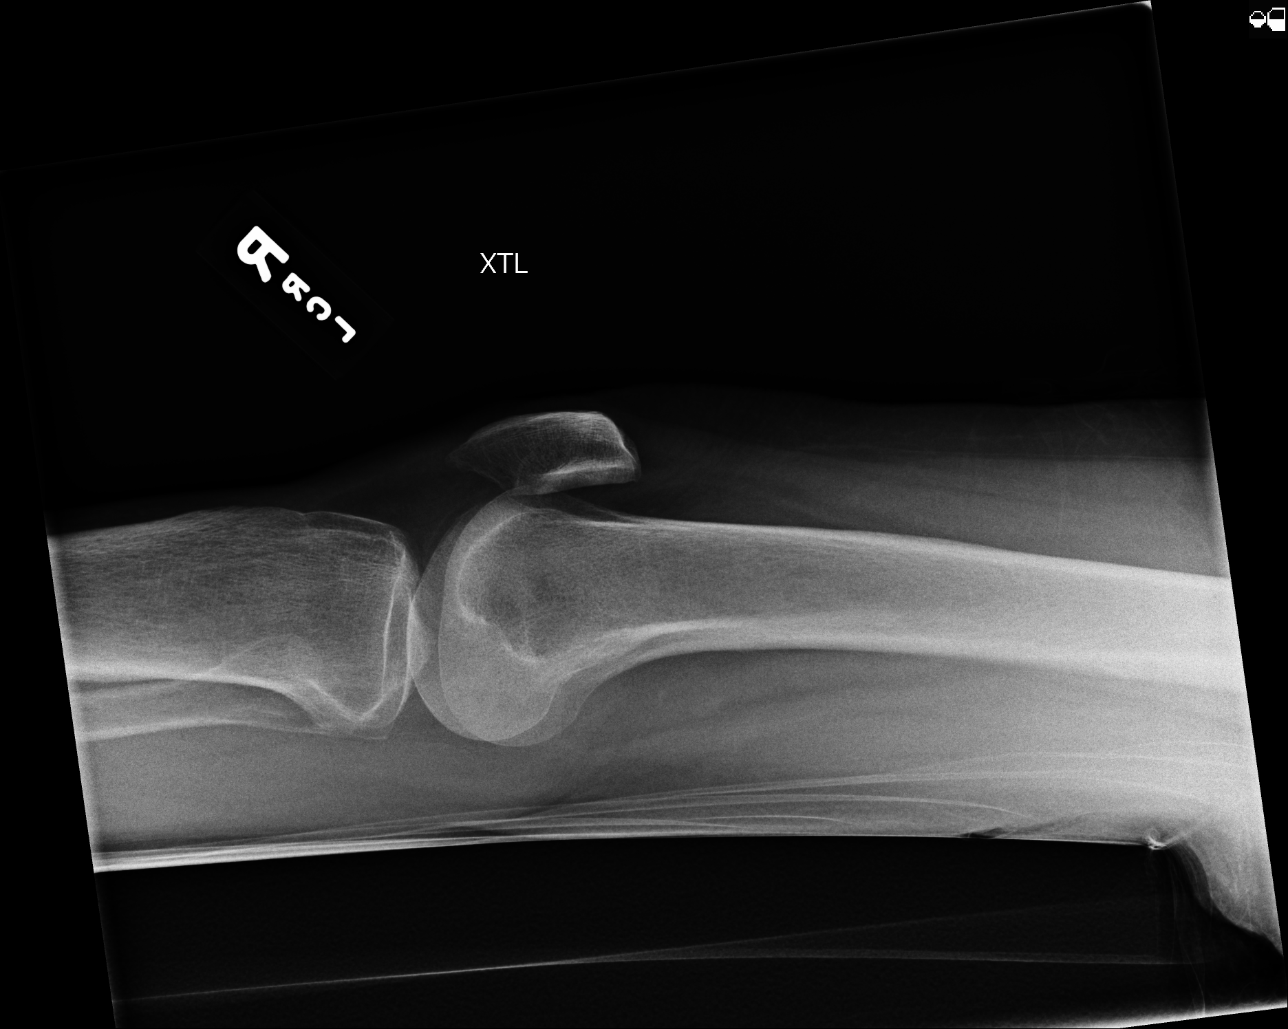

[4 of 4 positions shown; findings below may reference images not displayed]

FINDINGS: No acute fracture. There may be a trace knee effusion.
IMPRESSION: No acute fracture seen.

[REDACTED]

## 2014-09-14 NOTE — Consult Note (Signed)
PATIENT NAME:  Gina Andersen, Gina Andersen MR#:  [id number] DATE OF BIRTH:  [birth year]  DATE OF CONSULTATION:  04/12/2013  CONSULTING PHYSICIAN:  Audery Amel, MD  IDENTIFYING INFORMATION AND REASON FOR CONSULT:  A 80 year old woman with a history of diabetes and multiple medical problems, who was admitted today with altered mental status and recent refusal of medication. Consult for "altered mental status, IVC, and delirium".   HISTORY OF PRESENT ILLNESS: Information obtained from the patient and the chart. This is a 80 year old woman who was brought in today. Family reported that she had been confused, and had been refusing to take her insulin shots. Also, she has had several hospitalizations recently, with worsening hypoglycemia and frequent falls. The patient had apparently called her daughter and said that she was tired of the needles, and did not want to use her insulin any more. There have been a lot of EMS calls to the house because of concerns about safety. According to the patient, she tells a rambling story about her insulin. It is very hard to follow. She does indicate that it makes her nervous to give herself her own shots, and she does not want do it anymore. She indicates that she feels happy with her family giving her the shots, and she is not refusing the insulin lately. The patient denies feeling depressed. Denies any suicidal ideation. Denies having any hallucinations. Basically does not report any specific symptoms to me.   PAST PSYCHIATRIC HISTORY: Long-standing history of diabetes, hypothyroidism, hypertension, anemia, gastric reflux symptoms, anxiety. Has had multiple hospitalizations, a lot of them recently seem to probably indicate an inability to take care of herself well. I saw the patient at the beginning of 2014 for a similar question. At that time, I had thought that she was able to make decisions for herself. She appears to be in a significantly worse state of mind now. At that time, she  was agreeable, fortunately, to going to [facility].   SOCIAL HISTORY: She has been living independently. There is family involved, her children and grandchildren, who seem to live nearby. It does not seem like they are able to do 100% of her care.   FAMILY HISTORY:  None identified.   SUBSTANCE ABUSE HISTORY:  None identified.   CURRENT MEDICATIONS:  Lyrica 20 mg once a day, Senokot twice a day, omeprazole 40 mg a day, levothyroxine 50 mcg once a day, insulin 40 units at bedtime, vitamin B12 injections once a month, Augmentin 500 mg twice a day for 7 days.   ALLERGIES:  No known drug allergies.   REVIEW OF SYSTEMS: The patient denied any problems with her mood. Denied any psychosis. Denied hallucinations. Denied suicidal ideation. Did not report any pain. Did say that she had felt sick to her stomach, and had been tired. Otherwise negative review of systems.   MENTAL STATUS EXAMINATION:  An elderly, somewhat disheveled woman, interviewed in a hospital room. She was awake and alert and interactive. Made pretty good eye contact. Psychomotor activity a little sluggish. Speech is slow, slurred, difficult to understand at times. Part of that is probably from her being so hard of hearing. Affect is euthymic and calm. Mood is stated as fine. Thoughts appear to be slow and somewhat disorganized. She is not able to answer questions in a straightforward, coherent way, tends to ramble and get off subject very quickly. Did not make any obviously delusional statements. Denied hallucinations. Denied suicidal or homicidal ideation. Not able to clearly describe to  me the nature of her diabetes, the use of the insulin, the importance of the shots. Not able to describe the problem she has had at home regarding falls. She is alert and oriented to her place and the date. Full mini mental status exam not completed.   LABORATORY RESULTS: Urinalysis very positive for protein and for glucose and blood in it. Possible  infection. Chemistry panel showed a glucose of 383. Hemoglobin A1c elevated at 6.9. Creatinine elevated at 1.34. She has a platelet count of 72, hemoglobin and hematocrit are low.   Head CT without contrast showed normal noncontrast head CT for age.   ASSESSMENT:  A 80 year old woman with a history of ongoing cognitive impairment, who has become more impaired, refusing her medicine. Family has been getting concerned about her falls and her poor health management. The patient herself does not display very good insight about it. She is not able to give a coherent understanding really of her situation and the problem she faces, or give a coherent explanation for how to handle it safely. I do not think, at this point, she is capable of making decisions for herself. I think it would be unsafe for her to go back to living independently, and she should go to an assisted living facility. I do not think right now there is any clear indication to add any psychiatric medicine, unless she becomes dangerously agitated. I also do not think that she needs to have a sitter in the room with her, and have discontinued that.   DIAGNOSIS, PRINCIPAL AND PRIMARY:  AXIS I:   1.  Delirium secondary to medical conditions.  2.  Dementia, vascular and Alzheimer's.   AXIS II:  Deferred.   AXIS III:  Diabetes, hypertension, gastric reflux, anemia, thrombocytopenia.   AXIS IV:  Severe from living independently.   AXIS V:  Functioning at time of evaluation 47.     ____________________________ Audery AmelJohn T. Issa Kosmicki, MD jtc:mr D: 04/12/2013 18:36:19 ET T: 04/12/2013 18:53:13 ET JOB#: [id number]  cc: Audery AmelJohn T. Lelani Garnett, MD, <Dictator> Audery AmelJOHN T Dmarcus Decicco MD ELECTRONICALLY SIGNED 04/12/2013 19:22

## 2014-09-14 NOTE — Discharge Summary (Signed)
PATIENT NAME:  Gina Andersen, Gina Andersen MR#:  [id number] DATE OF BIRTH:  1934/10/18  DATE OF ADMISSION:  04/12/2013 DATE OF DISCHARGE:  04/13/2013  PRIMARY CARE PHYSICIAN:  Burley SaverL. Katherine Bliss, MD  FINAL DIAGNOSES:  1.  Acute encephalopathy, possible urinary tract infection.  2.  Diabetes.  3.  Hypertension.  4.  Hypothyroidism.  5.  Chronic thrombocytopenia.  6.  Acute renal failure, which improved.   MEDICATIONS ON DISCHARGE:  Include cyanocobalamin 1000 mcg/mL, 1 mL injectable once a month on the 18th; levothyroxine 50 mcg daily; pregabalin 25 mg daily; omeprazole 40 mg daily; Senna 8.6 mg, 1 tablet twice a day as needed for constipation; Colace/Senna 50/8.6, one tablet daily; Travatan Z 0.004% ophthalmic solution, 1 drop both eyes once a day; insulin aspart sliding scale; Lantus insulin 10 units subcutaneous injection at bedtime; cephalexin 250 mg capsule every 8 hours for 2 days; amlodipine 2.5 mg daily.   HOME HEALTH:  Yes.   PHYSICAL THERAPY:  Nurse, nurse aide, social worker, help with meds and strength.   DIET:  Low-sodium diet, carbohydrate-controlled diet, regular consistency.   FOLLOWUP:  With Dr. Quillian Andersen in 1 to 2 weeks.   HOSPITAL COURSE:  The patient was admitted on November 19th, discharged November 20th; was admitted overnight with altered mental status, delirium, refusing to take medications; was started on involuntary commitment; was given IV Rocephin for possible UTI. Her Lantus was resumed. The patient was seen in consultation by psychiatry, who discontinued the one-to-one, but still left her on involuntarily committed. Believe that the patient does have dementia and acute delirium, and felt that the patient should be in assisted living. I did speak with the daughter on the phone. The daughter wanted the patient home and not placed, and that daughter is Gina FanningJulie. When I saw the patient on the 20th, the patient was talking to me coherently and answering yes or no questions. I know her from  previous hospitalizations. She is a high risk patient for repeat hospitalizations. Her acute encephalopathy had improved.   1.  She does have underlying dementia, possibility of UTI treated with Rocephin and then cephalexin as outpatient.  2.  For her diabetes, I decrease the dose down to 10 units of Lantus. The daughter will make sure that she gets it.  3.  Hypertension. Did start low-dose Norvasc.  4.  For hypothyroidism, her levothyroxine was continued.  5.  For her chronic thrombocytopenia, this is chronic. Nothing was given.  6.  For acute renal failure, this had improved with some IV fluid hydration overnight.   Creatinine upon discharge was 1.06. Creatinine two days prior to discharge 1.34. Urine culture grew out contamination. Hemoglobin A1c 6.9. Sugar when she came in was 338. Blood cultures were negative. Chest x-ray negative. CT scan of the head, no acute infarct.   I think this patient is high risk for readmission. Unable to place the patient at this time secondary to daughter wanting the patient home. She will make sure that she helps out with medications. I did set the patient up with home health, PT, nurse, nurse aide, social worker to help out with meds and strength. Again, this patient is high risk for readmission secondary to underlying dementia.    ____________________________ Herschell Dimesichard Andersen. Renae GlossWieting, MD rjw:ms D: 04/13/2013 17:35:13 ET T: 04/13/2013 22:37:46 ET JOB#: [id number]  cc: Herschell Dimesichard Andersen. Renae GlossWieting, MD, <Dictator> Burley SaverL. Katherine Bliss, MD Salley ScarletICHARD Andersen [name] MD ELECTRONICALLY SIGNED 04/18/2013 14:16

## 2014-09-14 NOTE — H&P (Signed)
PATIENT NAME:  Gina Andersen, [name] MR#:  [id number] DATE OF BIRTH:  January 25, 1935  DATE OF ADMISSION:  06/24/2012  PRIMARY CARE PHYSICIAN:  Unknown.   REFERRING EMERGENCY ROOM PHYSICIAN:  Dr. Darnelle CatalanMalinda.   CHIEF COMPLAINT:  Confusion, hyperglycemia.   HISTORY OF PRESENT ILLNESS:  The patient is confused.  All the contact numbers provided in the chart in patient info are expired and no longer in service.  I spoke to ER physician and the nurse in the ER, she tried to call EMS system and find out that who called the EMS and try to get any information about the patient.  We are unsuccessful so far to get the contact information and somebody who can give reliable history about the patient.  What information I have is all provided by the ER physician.  This patient was brought by the EMS with altered mental status and her blood sugar was very high.  On further work-up ER physician found that she have urine infection and he started her on Rocephin IV.  For blood sugar he gave insulin Lantus and short-acting, but she was still very confused and so he could not send her home.  She is being admitted for further work-up and management.   REVIEW OF SYSTEMS:  Unable to access due to patient's mental status.   PAST MEDICAL HISTORY:  Not known, but possibly hypothyroidism and diabetes mellitus.   PAST SURGICAL HISTORY:  Unable to get at this time.   MEDICATION HISTORY:  As per the pharmacy technician and the records:  1.  Lantus 20 units subcutaneous once a day.  2.  Levothyroxine 75 mcg once a day.  3.  Lyrica 25 mg oral capsule 2 times a day.  4.  Nexium 40 mg delayed release capsule once a day.  5.  Potassium chloride 20 mEq oral tablet extended release 2 times a day.  6.  NovoLog subcutaneous solution 2 to 4 units subcutaneous once a day at 9:00 a.m., 4 units at 1:00 p.m. and 6 units at 5:00 p.m., after checking blood sugar level.    These medications are not confirmed as her pharmacy was closed and pharmacy  technician could not confirm that.   SOCIAL HISTORY:  Not known at this time.   FAMILY HISTORY:  Not known at this time.   PHYSICAL EXAMINATION: VITAL SIGNS:  Temperature 98.1, pulse 81, respirations 11, blood pressure 127/72.  GENERAL:  The patient is alert, but is not oriented to time, place and person and keep on complaining of weird things like EKG monitoring patches on her chest are hurting, the room is very cold and also concern about her sweater which is removed by nurse and put on the side table to keep it with her so that she does not forget.  Does not appear in any acute distress at this point of time.  HEENT:  Head and neck atraumatic.  Conjunctivae pink.  Oral mucosa dry.  NECK:  Supple.  No JVD.  CARDIOVASCULAR:  S1, S2 present, regular.  No murmur.  RESPIRATORY:  Bilateral clear and equal air entry.  ABDOMEN:  Soft, nontender.  Bowel sounds present.  No organomegaly.  LEGS:  No edema.  SKIN:  No rashes.  Moves all four limbs spontaneously and power seems to be V/V.  No tremors.  PSYCHIATRIC:  Unable to assess as she is very disoriented or either in the delirium.   LABORATORY RESULTS:  Glucose on presentation 558, BUN 20, creatinine 1.10, sodium 132, potassium  4.1, chloride 98, CO2 25, calcium 9.4, magnesium 1.5, total protein 7.6, albumin 3.0, bilirubin 0.8, alkaline phosphate 107, SGOT 26, SGPT 14.  Troponin less than 0.02.  WBC 5.0, hemoglobin 11.2, platelet count 105, MCV 94.  Urinalysis, yellow, cloudy color, has 97 WBCs present and 1+ leukocyte esterase.  CT of the head without contrast, no acute intracranial process.  X-ray of the chest, portable, no acute disease of the chest.   ASSESSMENT AND PLAN:  The patient is a 80 year old female with unknown past medical history found having confusion and hyperglycemia.   1.  Altered mental status, possibly encephalopathy due to infection.  This looks like acute delirium as I was reviewing the previous records from [facility] visit and it mentions that there is no history of dementia.  Possibly this is acute event due to urinary tract infection and hopefully once the patient is treated properly she will be able to give [place] more history.  CT of the head is done and it is negative for any acute changes.  2.  Urinary tract infection.  She is started on Rocephin.  We will continue that and we will check urine culture for further changes.  3.  Hyperglycemia due to urinary tract infection.  Lantus 20 units plus sliding scale and we will also give IV fluids.  4.  Hypothyroidism.  We will continue levothyroxine.  5.  Hypomagnesemia.  Replaced by ER and we will recheck in the morning.   We will try to get more information from that doctor or if somebody calls into the hospital, please note down the numbers and update the contacts in the system.    Currently we will consider her FULL CODE in any acute adverse events.   TOTAL TIME SPENT IN THIS:  Fifty-five minutes.   Later on ER nurse told me that she called EMS system to find out who called ambulance for her and they found out that the patient herself called 911 and she was asking for her son.  The call was transferred to police as it looked like that she is confused and finally police called EMS and sent to her home and they found her blood sugar is high and she is confused and brought her to the hospital, so evidently we do not have any contact information at this point of time.    ____________________________ Hope Pigeon Elisabeth Pigeon, MD vgv:ea D: 06/24/2012 23:10:40 ET T: 06/25/2012 05:32:02 ET JOB#: [id number]  cc: Hope Pigeon. Elisabeth Pigeon, MD, <Dictator> Altamese Dilling MD ELECTRONICALLY SIGNED 07/15/2012 18:03
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
## 2014-09-14 NOTE — H&P (Signed)
PATIENT NAME:  Gina Andersen, LINEHAN MR#:  [id number] DATE OF BIRTH:  Oct 08, 1934  DATE OF ADMISSION:  04/12/2013  PRIMARY CARE PHYSICIAN: Dr. Laurita Quint   REFERRING PHYSICIAN: Dr. Shaune Pollack.   CHIEF COMPLAINT: Altered mental status, delirium, refusing medications at home.   HISTORY OF PRESENT ILLNESS: The patient is a 80 year old Caucasian female with past medical history of insulin-dependent diabetes mellitus, hypertension, GERD, anxiety and anemia, was recently admitted to the hospital several times for hypoglycemia and frequent falls, has been living in independent living facility, The patient has called her daughter today and told her that she is tired of needles and she does not want to take her insulin anymore. Daughter was concerned about her mom, as patient was refusing treatment. She has called EMS and also the patient was IVC'd and sent over to the ER for further evaluation. No family members came in with the patient. Per EMS, the patient has had recent difficulty with self-administration of insulin. The patient was previously placed at [facility] and family believes she needs to be in a facility. Per EMS, the patient has been evaluated by law enforcement at patient's home several times recently to do well-being checks. Initially, patient refused to come to the hospital, but eventually she was made IVC and family agreed to come to the hospital. In the ER, the patient was diagnosed with acute cystitis and has received IV Rocephin after cultures were obtained. As no family members are available, we are not sure about her baseline. The patient is still having some non-comprehensive speech and reporting that she is scared of needles and taking insulin. As reported by the ER nurse, though patient was denying to take insulin, they were able to start her peripheral IV line. I was unable to get any history from the patient, as she was repeatedly saying that she is scared of needles and she does not want  insulin any more.  The patient denies any pain.   PAST MEDICAL HISTORY: Insulin-dependent diabetes mellitus, hypothyroidism, glaucoma, anxiety, GERD, hard of hearing, carpal tunnel syndrome, hypertension and anemia.   PAST SURGICAL HISTORY: Cholecystectomy, hysterectomy, carpal tunnel syndrome.   ALLERGIES: No known drug allergies.   PSYCHOSOCIAL HISTORY: Living alone at an independent living facility. According to the old records, no history of smoking, alcohol or illicit drug use.   FAMILY HISTORY: Could not be obtained in view of altered mental status and the rest of the review of systems not be obtained in view of the patient's altered mental status.   HOME MEDICATIONS:  1. Pregabalin 20 mg p.o. once a day. 2.  Senna 8.6 mg 1 tablet by mouth 2 times a day. 3.  Omeprazole 40 mg once daily.  4.  Levothyroxine 50 mg once a day.  5.  Insulin glargine 40 units subcutaneous once at bedtime. 6.  Cyanocobalamin injection once monthly. 7. Augmentin 500 mg orally 2 times a day for seven days.  PHYSICAL EXAMINATION:  VITAL SIGNS:  Temperature 98.3, pulse 62, respirations 18, blood pressure 129/58, pulse oximetry 97%.  GENERAL APPEARANCE: Not in acute distress, frail-looking female, looking not in distress, but confused.  HEENT: Normocephalic, atraumatic. Pupils are equally reacting to light and accommodation. No scleral icterus. No conjunctival injection.   NECK: Supple. No JVD. No thyromegaly. No bruits.  LUNGS: Clear to auscultation bilaterally. No accessory muscle use and no anterior chest wall tenderness on palpation.  CARDIAC: S1, S2 normal. Regular rate and rhythm. No murmurs.  GASTROINTESTINAL: Soft. Bowel sounds are  positive in all four quadrants. Nontender, nondistended. No hepatosplenomegaly. No masses felt.  NEUROLOGIC: Awake and alert, but disoriented, not following verbal commands. Could not elicit motor and sensory. Reflexes are 2+.  EXTREMITIES: No edema. No cyanosis. No  clubbing.  SKIN: Warm to touch. Normal turgor. No rashes. No lesions.  PSYCHIATRIC: Mood and affect could not be elicited, as the patient is altered mental status.  MUSCULOSKELETAL: No joint effusion, tenderness, erythema.   LABORATORY AND IMAGING:  Accu-Chek 389. Serum glucose 383, BUN 18, creatinine 1.34, sodium 137, potassium 4.0, chloride 105, CO2 28, GFR 38, anion gap 4. Serum osmolality 292, calcium 9.5, total protein 7.5, total bilirubin 0.7, alkaline phosphatase 94, AST 29, ALT 17, WBC 3.6, hemoglobin 10.8, hematocrit 30.9, platelets are 72.   URINALYSIS: Yellow in color, clear in appearance, glucose greater than 500, nitrite negative, leukocyte esterase trace. Mucus is present   ASSESSMENT AND PLAN: A 80 year old Caucasian female sent over to the ER after she was made IVC by the family members for refusing  medical treatment,  was found to be delirious and with altered mental status in the ER, diagnosed with urinary tract infection. Will be admitted with the following assessment and plan:  1.  Altered mental status with delirium probably from acute cystitis. We will admit her to telemetry bed. Blood cultures and urine cultures are ordered. Continue IV Rocephin. The patient had a past medical history of urinary tract infection with enterococcus treated with Augmentin. We will continue IV Rocephin and wait for the urine culture report.  2.  Insulin-dependent diabetes mellitus. The patient seemed to be cooperative here in the ER. Will resume her home medication Lantus and the patient will be on sliding scale insulin.  3.  Delirium. Her baseline is unknown. We will continue IVC and one-on-one observation. Psych consult is placed in view of acute delirium and IVC.  4.  Hypothyroidism. Continue home medications; Synthroid.  5.  We will provide her gastrointestinal prophylaxis.  6. Deep vein thrombosis prophylaxis with sequential compression devices in view of thrombocytopenia. The patient does not  have any bruises or bleeding.  7.  Could not reach any family members, so will give her CODE STATUS of full code.  8. We will try to reach the family in the a.m. Case management consult is placed regarding discharge planning. The patient will be benefited with placement in skilled nursing facility.   Total time spent is 45 minutes.    ____________________________ Ramonita LabAruna Garek Schuneman, MD ag:NTS D: 04/12/2013 01:33:22 ET T: 04/12/2013 02:16:37 ET JOB#: [id number]  cc: Ramonita LabAruna Delrae Hagey, MD, <Dictator> Arta Silenceobert N. Schaller, MD Ramonita LabARUNA Kadden Osterhout MD ELECTRONICALLY SIGNED 04/26/2013 7:07

## 2014-09-14 NOTE — Discharge Summary (Signed)
PATIENT NAME:  Gina Andersen, [name] MR#:  [id number] DATE OF BIRTH:  1934-08-23  DATE OF ADMISSION:  02/12/2013 DATE OF DISCHARGE:  02/14/2013  PRIMARY CARE PHYSICIAN: Over at [facility].   FINAL DIAGNOSES:  1.  Acute encephalopathy.  2.  Hypoglycemia with diabetes.  3.  Abdominal pain with constipation.  4.  Weakness and unsteady gait.  5.  Hypothyroidism.  6.  B12 deficiency.  7.  Urinary tract infection with enterococcus.   MEDICATIONS ON DISCHARGE: Include vitamin B12 1000 mcg injectable once a month on the 18th, levothyroxine 50 mcg daily, pregabalin 25 mg daily, omeprazole 40 mg daily, senna 8.6 mg twice a day as needed for constipation, Colace/senna 50 mg/8.6 mg 1 tablet daily, Travatan Z 0.004% ophthalmic solution 1 drop to both eyes at bedtime, Lantus insulin decreased to 14 units subcutaneous injection at bedtime, insulin aspart sliding scale 4 times a day and Augmentin 500 mg twice a day for 7 days.   DIET: Low sodium, carbohydrate-controlled diet, regular consistency.   ACTIVITY: As tolerated.   REFERRAL: Physical therapy.   FOLLOWUP: With doctor at rehab in 1 to 2 days.   HOSPITAL COURSE: The patient was admitted 02/12/2013 and discharged 02/14/2013. She came in with altered mental status and found to have low sugar. She was admitted with the altered mental status and hypoglycemia. She was started on D5 drip and held her Lantus.   LABORATORY AND RADIOLOGICAL DATA DURING THE HOSPITAL COURSE: Included a urine culture that grew out greater than 100,000 enterococcus. Chest x-ray: Hypoinflation at the lung bases. Urinalysis was negative for nitrites and leukocyte esterase, but 1+ bacteria. Troponin was negative. Glucose 243, BUN 24, creatinine 1.56, sodium 142, potassium 4.1, chloride 111, CO2 21, calcium 9.4. Liver function tests normal range. GFR 32. White blood cell count 8.0, hemoglobin and hematocrit 10.2 and 29.0, platelet count of 103. EKG showed a normal sinus rhythm. CT scan  of the head showed no acute intracranial process. Creatinine upon discharge 1.47. Platelets 87, hemoglobin 9.3, white blood cell count 5.4. Abdomen flat and erect showed moderate amount of fecal material in the colon. Sugar upon discharge 154. Looking back at old labs, the patient's thrombocytopenia and anemia is chronic. The patient's creatinine is actually better than it was in the past. The patient does have chronic kidney disease.   HOSPITAL COURSE PER PROBLEM LIST:  1.  For the patient's acute encephalopathy, this had improved with elevation of the sugar. Whether or not the patient actually had a hypoglycemia induced seizure is unclear, but no treatment for this anyway. The patient's mental status is back to baseline.  2.  Hypoglycemia with diabetes. Her oral medication was stopped. Her Lantus was held initially. She was started on D5 drip. She will go home on a lower dose Lantus. I would rather have sugars on the higher side and than too low. Lantus 14 units at bedtime and sliding scale.  3.  Abdominal pain since her fall in the hospital. She did have a CT scan the last time she was in the hospital. X-ray did show constipation. Will treat that.  4.  Weakness and unsteady gait. Physical therapy recommended long term care.  5.  Hypothyroidism, on levothyroxine dose, unchanged.  6.  B12 deficiency. Continue B12 shots monthly.  7.  Urinary tract infection with enterococcus. I will treat this with Augmentin for 7 days. 8.  Chronic kidney disease, stable. 9.  Thrombocytopenia and anemia, stable.  TIME SPENT ON DISCHARGE: 40 minutes.  ____________________________ Gina Andersen. Renae Gloss, MD rjw:aw D: 02/14/2013 11:34:16 ET T: 02/14/2013 11:54:35 ET JOB#: [id number]  cc: Gina Andersen. Renae Gloss, MD, <Dictator> [facility] [place] [name] MD ELECTRONICALLY SIGNED 02/16/2013 14:45
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
## 2014-09-14 NOTE — H&P (Signed)
PATIENT NAME:  Gina Andersen, QURAISHI MR#:  [id number] DATE OF BIRTH:  01-27-1935  DATE OF ADMISSION:  05/20/2013  PRIMARY CARE PHYSICIAN:  Dr. Laurita Quint.   REFERRING PHYSICIAN:  Dr. Chiquita Loth.  CHIEF COMPLAINT:  Altered mental status, high blood sugar.   HISTORY OF PRESENT ILLNESS:  The patient is a 80 year old Caucasian female who is residing alone in an independent living facility was brought in by EMS. According to the ED staff story, the patient's daughter who takes care of her medications, including her insulin, was tired and felt like she needed to be rested. She did not have a chance to go and give her mom's insulin today and called the sheriff for assistance. The police officer has called EMS who got her into the ER. There were no family members available at bedside. The patient was found to be dehydrated with acute renal insufficiency. Her initial Accu-Chek was high at 496.  BUN was 25 and creatinine at 1.37. The patient was given 2 liters of IV fluids. According to the ER physician, the patient was getting Augmentin for possible urinary tract infection. The patient was found to be leukopenic but afebrile. Blood cultures and urine cultures were ordered which are pending. No family members are available at bedside and I left a voice message for the patient who is the next of kin.  During my examination, the patient's speech is abnormal. The patient is saying that she is getting too much medication and that she should not take 7 pills per day. It is very hard to understand what patient is expressing in view of her abnormal speech. According to the old medical records, the patient did not have any history of stroke, baseline abnormal speech.  The patient seemed to be pleasantly confused and reported chills to the ER staff.   PAST MEDICAL HISTORY:  1.  Insulin dependent diabetes mellitus. 2.  Hypothyroidism. 3.  Glaucoma. 4.  Anxiety. 5.  GERD. 6.  Hard of hearing. 7.  Carpal tunnel  syndrome. 8.  Hypertension. 9.  Anemia.  PAST SURGICAL HISTORY:  Cholecystectomy, hysterectomy, carpal tunnel syndrome.  ALLERGIES: No known drug allergies.  PSYCHOSOCIAL HISTORY: Living alone at independent living facility. According to the old records, no history of smoking, alcohol or illicit drug usage. Daughter comes and gives her insulin on daily basis.  FAMILY HISTORY: Could not be obtained due to altered mental status.  HOME MEDICATIONS:  1.  Pregabalin 20 mg p.o. once a day. 2.  Omeprazole 40 mg once daily 3.  Levothyroxine 50 mcg p.o. once daily. 4.  Lantus insulin 10 units subcutaneous once daily. 5.  Cyanocobalamin injection once monthly.  6.  Cephalexin 250 mg p.o. once daily.   The ROS is unobtainable as the patient presents with altered mental status and difficulty with speech.   PHYSICAL EXAMINATION: VITAL SIGNS: Temperature 97.9, pulse 62, respirations 18, blood pressure 118/62, pulse ox is 99%.  GENERAL APPEARANCE: Not in acute distress, thin looking cachetic female. HEENT:  Normocephalic, atraumatic. Pupils are equally reacting to light and accommodation. No conjunctival injection. Extraocular movements are intact. Dry mucous membranes. When talking the patient is deviated to right side, but no angulation of the mouth. No nasal labial fold. No postnasal drip. NECK: Supple, no JVD, no thyromegaly, no bruits. LUNGS: Are clear to auscultation bilaterally. No accessory muscle use and no anterior chest wall tenderness on palpation. CARDIAC: S1, S2, normal. Regular rate and rhythm. No murmurs. GASTROINTESTINAL: Soft, bowel sounds are positive in  all 4 quadrants. Nontender, nondistended, no hepatosplenomegaly, no masses felt. NEUROLOGIC: The patient is awake, but oriented x 1 to person, having difficulty following verbal commands, could spontaneously move all four extremities. Sensory seemed to be intact.  EXTREMITIES: No edema, no cyanosis, no clubbing. Pulses are  2+. SKIN: With increased turgor, no rashes, no lesions. MUSCUOSKELETAL: No joint effusion, tenderness, erythema. PSYCHIATRIC: Mood and affect could not be elicited, as the patient is in altered mental status.  LABORATORY AND IMAGING:  Accu-Chek initially was 496, then 386. Serum glucose 494, BUN 25, creatinine 1.37 which was trended up from 1.06 in November. Sodium 131, potassium 4.6, chloride 98, CO2 24, GFR is 37, anion gap is 9. Serum osmolality is 218, calcium 10.3, LFTs are normal. White count is low at 3.1, hemoglobin 11.8, hematocrit 34.0, platelet count is at 57,000 which was at 75,000 in November. It looks like patient has chronic thrombocytopenia. Urinalysis negative bilirubin, trace ketones, yellow in color, glucose is greater than 500, nitrites and leukocyte esterase is negative. CT head is pending. EKG and CT head is pending.   ASSESSMENT AND PLAN:  A 80 year old Caucasian female brought into the ER with altered mental status and high blood sugar will be admitted with the following assessment and plan: 1.  Altered mental status, could be from metabolic encephalopathy and dehydration, plus underlying sepsis with leukopenia. CAT scan of the head is pending. Chest x-ray is pending. Pan cultures were ordered. IV fluids were given. Will give patient on IV fluids. Will provide her IV Rocephin. This could be baseline versus acute onset. No family members were available, so I left a voice message for patient's daughter and awaiting call back. Will do neuro checks.  2.  Hyperglycemia. This could be because patient was not given her Lantus insulin by daughter. As the patient has altered mental status, we will keep her n.p.o. and give her sliding scale insulin and use Lantus for basal coverage. 3.  Recent history of urinary tract infection. The patient was treated with Keflex. Currently, the patient is neutropenic. Altered mental status could be from underlying sepsis. We will get pan cultures and  patient will be on prophylactic antibiotic with Rocephin.  4.  Hypertension. Currently, blood pressure is on the low normal side. Will hold off on the patient's present medication. 5.  The patient is thrombocytopenic. Etiology is unclear. LFTs are normal. Will continue close monitoring. This could be from recent use of antibiotics which could cause some bone marrow suppression. 6.  According to the ER staff, the patient is Full Code. We will continue her CODE STATUS has Full Code until we discuss with the family members. Daughter is her medical power of attorney. No family members are available at bedside. Voice mail was left with patient's next of kin. Awaiting call back.  Total time spent on admission:  50 minutes.  ____________________________ Ramonita LabAruna Thurmond Hildebran, MD ag:NTS D: 05/20/2013 01:41:00 ET T: 05/20/2013 05:51:26 ET JOB#: [id number]  cc: Ramonita LabAruna Chastity Noland, MD, <Dictator> Arta Silenceobert N. Schaller, MD Ramonita LabARUNA Cambell Stanek MD ELECTRONICALLY SIGNED 05/24/2013 3:02
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
## 2014-09-14 NOTE — Discharge Summary (Signed)
PATIENT NAME:  [name], Majorie J MR#:  [id number] DATE OF BIRTH:  09-Dec-1934  DATE OF ADMISSION:   06/24/2012 DATE OF DISCHARGE:  06/29/2012  ADMITTING DIAGNOSES:  Confusion, hyperglycemia.   DISCHARGE DIAGNOSES: 1.  Acute encephalopathy felt to be due to urinary tract infection, as well as hyperglycemia. The patient appears to be at baseline.  2.  Generalized weakness and deconditioning. The patient needs further physical therapy and treatment.  3.  Urinary tract infection, on now Cipro, total of 7 days of antibiotics.  4. Hyperglycemia with diabetes, which is poorly-controlled brittle. Changes in her insulin regimen have been done. She will need a close followup.  5.  Hypothyroidism.  6.  Chronic thrombocytopenia.   PERTINENT LABORATORY DATA AND EVALUATIONS:  Admitting BMP: Glucose was 558, BUN 50, creatinine 1.10, sodium 132, potassium 4.1, chloride 98, CO2 was 25. LFTs were normal with albumin of 3.0. WBC count 5.0, hemoglobin 11.2, platelet count 105. EKG in sinus rhythm with sinus arrhythmia. Chest x-ray showed no acute cardiopulmonary processes. Urine culture showed greater than 100,000 E. coli, pansensitive. CT scan of the head showed no acute abnormality. Hemoglobin A1c 11.4.   HOSPITAL COURSE:  Please refer to H and P done by the admitting physician. The patient is a [age] year old white female who was brought from home due to confusion and hyperglycemia. The patient was also noted to have a UTI. She was also dehydrated. The patient was admitted for acute metabolic encephalopathy as a result of UTI, hyperglycemia. Her UTI was treated with ceftriaxone, subsequently switched to p.o. antibiotics. In terms of her mild acute encephalopathy, that resolved with the antibiotics, IV fluids and improvement in her blood glucose. There was some question regarding the patient's competency to make decisions. She was seen by psychiatry, who felt that she was competent enough to make her own decisions. Her son  has a health care power of attorney. At this time, she is doing much better and is in need for further rehab.   DISCHARGE MEDICATIONS:  Levothyroxine 50 mcg daily, lorazepam 1 tab p.o. at bedtime as needed, Travatan Z 0.004% 1 drop to each eye daily, KCl 20 mEq 1 tab p.o. b.i.d., omeprazole 40 daily, Lantus 30 at bedtime, NovoLog as written, Tylenol 650 q.6 p.r.n., Cipro 250 mg 1 tab p.o. q.12 x 2 more days.   DIET:  Low sodium-carbohydrate consistent diet.   ACTIVITY:  As tolerated with further PT.   FOLLOWUP:  Follow up with primary M.D. in 1 to 2 weeks.    TIME SPENT:  35 minutes.     ____________________________ Lacie ScottsShreyang H. Allena KatzPatel, MD shp:dm D: 06/29/2012 10:47:00 ET T: 06/29/2012 11:20:14 ET JOB#: [id number]  cc: Tahani Potier H. Allena KatzPatel, MD, <Dictator> Charise CarwinSHREYANG H Ziah Turvey MD ELECTRONICALLY SIGNED 07/05/2012 10:13

## 2014-09-14 NOTE — H&P (Signed)
PATIENT NAME:  Gina Andersen, Gina Andersen MR#:  [id number] DATE OF BIRTH:  [birth year]  PRIMARY CARE PHYSICIAN: Dr. Laurita Quint.    REFERRING PHYSICIAN: Dr. Lowella Fairy.   CHIEF COMPLAINT: Altered mental status.   HISTORY OF PRESENT ILLNESS: Gina Andersen is a 80 year old white female with past medical history of diabetes mellitus, poorly-controlled who recently had multiple admissions for hypoglycemia and frequent falls. The patient today was noted to be confused. At that time, the patient was found to have blood sugars of 40. Considering this, EMS was called. They could not obtain IV access. The patient was given glucagon with improvement of the blood sugars, although the patient continued to be confused. Considering this, the patient is brought to the Emergency Department. The patient had recent admission to the hospital and was discharged on January 31, 2013, at which time the patient's insulin was increased to Lantus 25 units twice daily and added glimepiride. After coming to the Emergency Department, the patient had continued to have few other hypoglycemic episodes. Even though there was concern about urinary tract infection, the patient had negative nitrites and leukocyte esterase, 1+ bacteria. Could not obtain any history from the patient. The patient continued to state that she needs she needs to urinate. She was not following any other commands.   PAST MEDICAL HISTORY:  1.  Diabetes mellitus, insulin-dependent.  2.  Hypothyroidism.  3.  Glaucoma.  4.  Anxiety.  5.  Anemia.  6.  Gastroesophageal reflux disease.  7.  Hard of hearing.  8.  Carpal tunnel syndrome.  9.  Hypertension.   PAST SURGICAL HISTORY: 1. Cholecystectomy.  2.  Hysterectomy.   3.  Carpal tunnel release.    ALLERGIES: No known drug allergies.   HOME MEDICATIONS: 1.  Travatan Tablets one drop both eyes.  2.  8.6 mg 2 times a day.  3.   25 mg a day.  4.  Potassium chloride 20 mEq once day.  5.  Omeprazole 40 mg once a  day.  6.  Lorazepam 0.5 mg at bedtime.  7.  Levothyroxine 50 mcg once a day.  8.  Lantus 27 units subcutaneous q. 12 hours.  9.  Insulin regular on sliding scale insulin.  10.  Aspart 10 units t.i.d.  11.  Glimepiride 2 mg 2 times a day.  12.  Docusate senna 58.6 mg once a day.  13.  Vitamin B 12,000 mcg daily.   SOCIAL HISTORY: No history of smoking, drinking, alcohol or using illicit drugs. Per previous records, the patient lives with her son.   FAMILY HISTORY: Could not be obtained from the patient.   REVIEW OF SYSTEMS: Could not be obtained from the patient as the patient is currently confused.   PHYSICAL EXAMINATION: GENERAL: This is a frail looking female, looks cachectic, chronically ill-looking, not in distress, confused.  VITAL SIGNS: Temperature 99.9, pulse 85, blood pressure 93/62, respiratory rate of 18, oxygen saturation is 100% on room air.  HEENT: Head normocephalic, atraumatic. Eyes: There is no sclerae icterus. Conjunctivae normal. Pupils equal and react to light. Bitemporal wasting. Mucous membranes: Mild dryness. Oropharynx: No  erythema.  NECK: Supple. No lymphadenopathy. No JVD. No bruit. No thyromegaly.  CHEST: Has no focal tenderness.  LUNGS: Bilaterally clear to auscultation.  HEART: S1, S2 regular. No murmurs are heard.  ABDOMEN: Bowel sounds present. Soft, nontender and nondistended.    EXTREMITIES: No pedal edema. Pulses 2+.  NEUROLOGIC: The patient is not oriented to place, person and time. No apparent  cranial nerve abnormalities. Could not examine the motor and sensory.  SKIN: No rash or lesions.  MUSCULOSKELETAL: Could not examine as patient has altered mental status.   LABORATORY DATA: Complete metabolic panel: Glucose 243, BUN 24, creatinine of 1.6, the rest of the values are within normal limits. Albumin 3. Troponin 0.02.   CT head without contrast: No acute intracranial abnormality.   CBC: WBC of 8, hemoglobin 10.2, platelet count of 103.    Urinalysis negative for nitrites and leukocyte esterase, 1+ bacteria.   Chest x-ray, one view, portable:  Hypoinflation with minimal right lung base atelectasis.   ASSESSMENT AND PLAN: Gina Andersen 80 year old female who comes to the Emergency Department with hypoglycemia and altered mental status.  1.  Hypoglycemia. We will decrease the dose of the Lantus to 45 units daily. The patient is currently cachectic-looking, seems to have poor nutritional intake. We will continue to monitor on sliding scale insulin.  2.  Altered mental status. Cause is quite uncertain. The patient's blood sugars are already well within normal limits. If the patient continues to have confusion, may consider getting an MRI of the brain.  3.  Hypertension, currently well controlled  4.  Hypothyroidism. Continue Synthroid.   TIME SPENT: 50 minutes   ____________________________ [name], MD pv:cc D: 02/12/2013 23:34:24 ET T: 02/13/2013 01:38:46 ET JOB#: [id number]  cc: [name], MD, <Dictator> Arta Silenceobert N. Schaller, MD [name] MD ELECTRONICALLY SIGNED 02/14/2013 21:10
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
## 2014-09-14 NOTE — Discharge Summary (Signed)
PATIENT NAME:  Gina Andersen MR#:  [id number] DATE OF BIRTH:  Sep 13, 1934  DATE OF ADMISSION:  01/27/2013 DATE OF DISCHARGE:   01/30/2013  PRESENTING COMPLAINT: Fall at home and poorly controlled sugar.   DISCHARGE DIAGNOSES: 1.  Recurrent falls, likely secondary to severe peripheral neuropathy due to diabetes.  2.  Poorly controlled type 2 diabetes.  3.  Impaired hearing.  4.  Abdominal pain, resolved.  5.  Hypertension.   CONDITION ON DISCHARGE: Fair.   CODE STATUS: FULL CODE.   MEDICATIONS AT DISCHARGE: 1.  Senokot 1 tablet p.o. b.i.d. p.r.n.  2.  Synthroid 0.05 mg p.o. daily.  3.  Lyrica 25 mg daily.  4.  Travatan 0.004%, 1 drop both eyes at bedtime.  5.  Glimepiride 2 mg b.i.d.  6.  K-Dur 20 mEq daily.  7.  Docusate. 8.  Insulin aspart 10 mg subQ t.i.d. a.c.  9.  Insulin Lantus 25 units b.i.d.  10.  Sliding scale insulin.  11.  Omeprazole 40 mg daily.   Physical therapy consultation.   DIET:  Two gram, ADA 1800-calorie diet.   LABORATORY DATA: At discharge, glucose is 208.   White count is 5.3. H and H  is 10.8 and 30.8.  CT OF THE HEAD AT ADMISSION: No acute intracranial abnormality.   CT OF THE ABDOMEN AND PELVIS: Showed:  1.  Difficulty to completely exclude small laceration or subscapular hematoma given the lack of IV contrast. Details not available.  2.  Improving appearance of left ureter and urinary bladder, as well as left kidney with some residual perinephric inflammatory stranding, larger on the left than the right.  3. Cholecystectomy clips are present.   Urine culture positive for enterococcus fecalith. The patient's UA is negative for UTI. She is asymptomatic.   Left ankle x-ray limited because of positioning. The distal fibula is poorly seen. There is spurring in the plantar and Achilles region of the calcaneus. There is no definite acute fracture, dislocation or foreign body is evident.   BRIEF SUMMARY OF HOSPITAL COURSE: Gina Andersen is a  80 year old Caucasian female who has poorly-controlled diabetes, has a history of hypertension, impaired hearing, comes from home after she had frequent falls at home. She did not have any acute injury, however, was admitted with:  1.  Multiple falls without any focal signs of weakness or any fracture noted. She was seen by physical therapy and occupational therapy and recommends rehab. The patient had recently been at [facility] and was just discharged about 12 days ago. The patient has peripheral neuropathy which is likely cause of her frequent falls. The patient will be discharged to rehab facility most likely today.  2.  Type 2 diabetes, seems poorly controlled. The patient has been noncompliant with her diet. Her Lantus was increased to 25 b.i.d., added aspart insulin t.i.d. with meals and resumed p.o. glimepiride. Sugars were better after above change.  3.  Abdominal pain on admission, cause uncertain. The patient had a CT of the abdomen without contrast. Results as above were noted. There was no evidence of any acute signs and symptoms of acute (Dictation Anomaly) <<MISSING TEXT>>.  Repeat abdominal CAT scan was not done. Given patient being asymptomatic and creatinine clearance low (Dictation Anomaly) <<MISSING TEXT>> any IV contrast.  4.  Debility, generalized weakness with falls. Physical therapy recommends rehab.  5.  Hypertension, well controlled.  6.  Deep vein thrombosis prophylaxis with sequential compression devices.   Hospital stay otherwise remained stable. The patient remained  a FULL CODE.  TIME SPENT: 40 minutes.   ____________________________ Wylie HailSona A. Allena KatzPatel, MD sap:dmm D: 01/31/2013 13:01:00 ET T: 01/31/2013 13:28:34 ET JOB#: [id number]  cc: Masaru Chamberlin A. Allena KatzPatel, MD, <Dictator>

## 2014-09-14 NOTE — Consult Note (Signed)
Brief Consult Note: Diagnosis: delirium and dementia.   Patient was seen by consultant.   Consult note dictated.   Orders entered.   Comments: Psychiatry: Patient seen and chart reviewed. Patient with likely underlying dementia presented to the hospital in a condition of delirium today. She seems now like she might be a little better but still demented. No immediate indication for psych meds. Patient is not acting in a dangerous way that would require a sitter. Sitter can be dc'ed. There is no practical reason to "undo" the commitment paperwork. If the family signs her in it automatically cancels the IVC as long as the doctor allows it. If she is here 7 days and you dont renew it, it automatically cancels the IVC. Meanwhile there is no harm in it. I did dc the sitter. Patient is not capable of making decisions for herself and should be in assisted living.  Electronic Signatures: Audery Amellapacs, John T (MD)  (Signed [phone]-Nov-14 18:25)  Authored: Brief Consult Note   Last Updated: 19-Nov-14 18:25 by Audery Amellapacs, John T (MD)

## 2014-09-14 NOTE — Discharge Summary (Signed)
PATIENT NAME:  Gina Andersen, Ellamarie J MR#:  [id number] DATE OF BIRTH:  June 23, 1934  DATE OF ADMISSION:  01/27/2013 DATE OF DISCHARGE:   01/31/2013  PRESENTING COMPLAINT: Fall at home and poorly controlled sugar.   DISCHARGE DIAGNOSES: 1.  Recurrent falls, likely secondary to severe peripheral neuropathy due to diabetes.  2.  Poorly controlled type 2 diabetes.  3.  Impaired hearing.  4.  Abdominal pain, resolved.  5.  Hypertension.   CONDITION ON DISCHARGE: Fair.   CODE STATUS: FULL CODE.   MEDICATIONS AT DISCHARGE: 1.  Senokot 1 tablet p.o. b.i.d. p.r.n.  2.  Synthroid 0.05 mg p.o. daily.  3.  Lyrica 25 mg daily.  4.  Travatan 0.004%, 1 drop both eyes at bedtime.  5.  Glimepiride 2 mg b.i.d.  6.  K-Dur 20 mEq daily.  7.  Docusate. 8.  Insulin aspart 10 mg subQ t.i.d. a.c.  9.  Insulin Lantus 25 units b.i.d.  10.  Sliding scale insulin.  11.  Omeprazole 40 mg daily.   Physical therapy consultation.   DIET:  Two gram, ADA 1800-calorie diet.   LABORATORY DATA: At discharge, glucose is 208.   White count is 5.3. H and H  is 10.8 and 30.8.  CT OF THE HEAD AT ADMISSION: No acute intracranial abnormality.   CT OF THE ABDOMEN AND PELVIS: Showed:  1.  Difficulty to completely exclude small laceration or subscapular hematoma given the lack of IV contrast. Details not available.  2.  Improving appearance of left ureter and urinary bladder, as well as left kidney with some residual perinephric inflammatory stranding, larger on the left than the right.  3. Cholecystectomy clips are present.   Urine culture positive for enterococcus fecalith. The patient's UA is negative for UTI. She is asymptomatic.   Left ankle x-ray limited because of positioning. The distal fibula is poorly seen. There is spurring in the plantar and Achilles region of the calcaneus. There is no definite acute fracture, dislocation or foreign body is evident.   BRIEF SUMMARY OF HOSPITAL COURSE: Gina Andersen is a  80 year old Caucasian female who has poorly-controlled diabetes, has a history of hypertension, impaired hearing, comes from home after she had frequent falls at home. She did not have any acute injury, however, was admitted with:  1.  Multiple falls without any focal signs of weakness or any fracture noted. She was seen by physical therapy and occupational therapy and recommends rehab. The patient had recently been at [facility] and was just discharged about 12 days ago. The patient has peripheral neuropathy which is likely cause of her frequent falls. The patient will be discharged to rehab facility most likely today.  2.  Type 2 diabetes, seems poorly controlled. The patient has been noncompliant with her diet. Her Lantus was increased to 25 b.i.d., added aspart insulin t.i.d. with meals and resumed p.o. glimepiride. Sugars were better after above change.  3.  Abdominal pain on admission, cause uncertain. The patient had a CT of the abdomen without contrast. Results as above were noted. There was no evidence of any acute signs and symptoms of acute cva   Repeat abdominal CAT scan was not done. Given patient being asymptomatic and creatinine clearance lowany IV contrast.  4.  Debility, generalized weakness with falls. Physical therapy recommends rehab.  5.  Hypertension, well controlled.  6.  Deep vein thrombosis prophylaxis with sequential compression devices.   Hospital stay otherwise remained stable. The patient remained a FULL CODE.  TIME SPENT: 40  minutes.   ____________________________ Wylie Hail Allena Katz, MD sap:dmm D: 01/31/2013 13:01:00 ET T: 01/31/2013 13:28:34 ET JOB#: [id number]  cc: Dorena Dorfman A. Allena Katz, MD, <Dictator> Willow Ora MD ELECTRONICALLY SIGNED 02/01/2013 12:17

## 2014-09-14 NOTE — Consult Note (Signed)
Brief Consult Note: Diagnosis: dementia due to prob vascular and/or alzheimers.   Patient was seen by consultant.   Consult note dictated.   Comments: Psychiatry: Patient seen. Chart reviewed. Patient appears to be generally capable of making decisions although this would be situation dependent. Fortunately, she is completely agreeable with the idea of discharge to [facility] or anywhere else they can care for her. She realizes she can not take care of herself. Will follow.  Electronic Signatures: Audery Amellapacs, John T (MD)  (Signed 04-Feb-14 19:13)  Authored: Brief Consult Note   Last Updated: 04-Feb-14 19:13 by Audery Amellapacs, John T (MD)

## 2014-09-15 NOTE — Discharge Summary (Signed)
PATIENT NAME:  Gina Andersen, [name] MR#:  [id number] DATE OF BIRTH:  1934-10-07  DATE OF ADMISSION:  05/20/2013 DATE OF DISCHARGE:  05/23/2013  DISCHARGE DIAGNOSES: 1.  Hyperglycemia.  2.  Acute renal failure.  3.  Diabetes.  4.  Dementia.  5.  History of hypertension, and blood pressure remained in control.   MEDICATIONS ON DISCHARGE: 1.  Cyanocobalamin 1000 mg injectable once a month.  2.  Levothyroxine 50 mcg once a day.  3.  Pregabalin 25 mg oral once a day.  4.  Omeprazole 40 mg once a day.  5.  Senna 8.6 mg oral tablet 2 times a day as needed.  6.  Insulin sliding scale coverage.  7.  Metformin extended release 750 mg oral tablet 2 times a day.  8.  Insulin glargine 100 unit's mL subcutaneous, 7 units subcutaneous 2 times a day. 9.  Have physical therapy and visiting nurse at home and carbohydrate-controlled ADA diet.  10.  Follow-up visit in 1 to 2 weeks.  11.  Routine follow up [facility].   Total time spent on this discharge: 40 minutes.    ____________________________ Hope PigeonVaibhavkumar G. Elisabeth PigeonVachhani, MD vgv:NTS D: 05/27/2013 23:58:22 ET T: 05/28/2013 00:37:11 ET JOB#: [id number]  cc: Hope PigeonVaibhavkumar G. Elisabeth PigeonVachhani, MD, <Dictator> Arta Silenceobert N. Schaller, MD Altamese DillingVAIBHAVKUMAR Tyannah Sane MD ELECTRONICALLY SIGNED 05/31/2013 13:46

## 2014-09-15 NOTE — Discharge Summary (Signed)
PATIENT NAME:  Gina Andersen, Gina Andersen MR#:  [id number] DATE OF BIRTH:  11/21/1934  DATE OF ADMISSION:  05/20/2013 DATE OF DISCHARGE:  05/23/2013  ADDENDUM  HISTORY OF PRESENTING ILLNESS:  A 80 year old Caucasian female residing alone in an independent living facility brought in by EMS according to the emergency staff.  The patient's daughter takes care of her medication including her insulin, was tired and felt like she needed to be rested.  She did not have a chance to go and give her mother some insulin, so called Sheriff for assistance, office called EMS who brought the patient to the Emergency Room.  The patient's blood sugar level was 496 on arrival and creatinine was 1.37.  The patient was somewhat confused on arrival.  She was started on IV fluids and insulin for her diabetes.   HOSPITAL COURSE AND STAY:   1.  Altered mental status.  Most likely it was due to metabolic encephalopathy due to dehydration and hypoglycemia.  She had just mild improvement in her mental level, but she had baseline dementia and so not much improvement in overall mental status.  Initially she was started on antibiotic suspecting there is some kind of infection going on, but we stopped antibiotics as there was no source of infection and patient remained stable.  Her TSH was found to be low and so we started her on levothyroxine replacement.  2.  Hyperglycemia.  She did not receive her Lantus before coming to the hospital, started on sliding scale coverage and then long-acting insulin in the hospital and arranged for visiting nurse and home health aide on discharge to assure her regularly getting medication.  3.  Recent history of urinary tract infection.  The patient was treated with Keflex and her urinalysis was negative.  No fever, so we stopped Rocephin.  4.  Acute renal failure.    The patient was discharged home with home health aide and visiting nurse.   IMPORTANT LABORATORY RESULTS IN THE HOSPITAL:  Glucose was 494, BUN  25, creatinine 1.37.  On admission, WBC 3.1, hemoglobin 11.8, platelet count 57.  The next day blood cultures and urinalysis were negative.  The next day white cell count was 3000 and hemoglobin was 10.2.  Creatinine came to 1.01, magnesium 1.2.   Total time spent on this discharge 40 minutes.    ____________________________ Hope PigeonVaibhavkumar G. Elisabeth PigeonVachhani, MD vgv:ea D: 05/28/2013 00:19:04 ET T: 05/28/2013 03:11:21 ET JOB#: [id number]  cc: Hope PigeonVaibhavkumar G. Elisabeth PigeonVachhani, MD, <Dictator> Altamese DillingVAIBHAVKUMAR Emile Kyllo MD ELECTRONICALLY SIGNED 05/31/2013 13:47

## 2014-09-19 ENCOUNTER — Ambulatory Visit (INDEPENDENT_AMBULATORY_CARE_PROVIDER_SITE_OTHER): Payer: Medicare Other | Admitting: Podiatry

## 2014-09-19 ENCOUNTER — Encounter: Payer: Self-pay | Admitting: Podiatry

## 2014-09-19 DIAGNOSIS — B351 Tinea unguium: Secondary | ICD-10-CM

## 2014-09-19 DIAGNOSIS — E1140 Type 2 diabetes mellitus with diabetic neuropathy, unspecified: Secondary | ICD-10-CM

## 2014-09-19 DIAGNOSIS — M2040 Other hammer toe(s) (acquired), unspecified foot: Secondary | ICD-10-CM

## 2014-09-19 DIAGNOSIS — M79672 Pain in left foot: Secondary | ICD-10-CM

## 2014-09-19 DIAGNOSIS — M79676 Pain in unspecified toe(s): Secondary | ICD-10-CM

## 2014-09-19 NOTE — Progress Notes (Signed)
She presents today to pick up her diabetic shoes and I have her toenails trimmed because they are long and painful.  Objective: Vital signs are stable she is alert. Pulses are palpable bilateral. No open lesions are noted. Her nails are thick yellow dystrophic with mycotic and painful palpation.  Assessment: Diabetes mellitus with diabetic peripheral vascular disease and diabetic peripheral neuropathy hammertoe deformities are noted. Pain in limb secondary to onychomycosis.  Plan: Debridement of nails 1 through 5 bilateral covered service secondary to pain. Follow-up with her as needed.

## 2014-09-19 NOTE — Patient Instructions (Signed)

## 2015-01-11 ENCOUNTER — Emergency Department: Payer: Medicare Other

## 2015-01-11 ENCOUNTER — Inpatient Hospital Stay
Admission: EM | Admit: 2015-01-11 | Discharge: 2015-01-18 | DRG: 871 | Disposition: A | Discharge status: Skilled Nursing Facility | Payer: Medicare Other | Attending: Internal Medicine | Admitting: Internal Medicine

## 2015-01-11 ENCOUNTER — Encounter: Payer: Self-pay | Admitting: Emergency Medicine

## 2015-01-11 DIAGNOSIS — K219 Gastro-esophageal reflux disease without esophagitis: Secondary | ICD-10-CM | POA: Diagnosis present

## 2015-01-11 DIAGNOSIS — H9190 Unspecified hearing loss, unspecified ear: Secondary | ICD-10-CM | POA: Diagnosis not present

## 2015-01-11 DIAGNOSIS — G9341 Metabolic encephalopathy: Secondary | ICD-10-CM | POA: Diagnosis present

## 2015-01-11 DIAGNOSIS — N179 Acute kidney failure, unspecified: Secondary | ICD-10-CM | POA: Diagnosis present

## 2015-01-11 DIAGNOSIS — Z833 Family history of diabetes mellitus: Secondary | ICD-10-CM

## 2015-01-11 DIAGNOSIS — IMO0002 Reserved for concepts with insufficient information to code with codable children: Secondary | ICD-10-CM | POA: Diagnosis present

## 2015-01-11 DIAGNOSIS — R059 Cough, unspecified: Secondary | ICD-10-CM

## 2015-01-11 DIAGNOSIS — N136 Pyonephrosis: Secondary | ICD-10-CM | POA: Diagnosis not present

## 2015-01-11 DIAGNOSIS — H409 Unspecified glaucoma: Secondary | ICD-10-CM | POA: Diagnosis not present

## 2015-01-11 DIAGNOSIS — N1330 Unspecified hydronephrosis: Secondary | ICD-10-CM

## 2015-01-11 DIAGNOSIS — Z8249 Family history of ischemic heart disease and other diseases of the circulatory system: Secondary | ICD-10-CM | POA: Diagnosis not present

## 2015-01-11 DIAGNOSIS — E1122 Type 2 diabetes mellitus with diabetic chronic kidney disease: Secondary | ICD-10-CM | POA: Diagnosis present

## 2015-01-11 DIAGNOSIS — N183 Chronic kidney disease, stage 3 (moderate): Secondary | ICD-10-CM | POA: Diagnosis not present

## 2015-01-11 DIAGNOSIS — A419 Sepsis, unspecified organism: Secondary | ICD-10-CM | POA: Diagnosis present

## 2015-01-11 DIAGNOSIS — A4151 Sepsis due to Escherichia coli [E. coli]: Secondary | ICD-10-CM | POA: Diagnosis present

## 2015-01-11 DIAGNOSIS — R1313 Dysphagia, pharyngeal phase: Secondary | ICD-10-CM | POA: Diagnosis not present

## 2015-01-11 DIAGNOSIS — D649 Anemia, unspecified: Secondary | ICD-10-CM | POA: Diagnosis present

## 2015-01-11 DIAGNOSIS — E871 Hypo-osmolality and hyponatremia: Secondary | ICD-10-CM | POA: Diagnosis present

## 2015-01-11 DIAGNOSIS — E43 Unspecified severe protein-calorie malnutrition: Secondary | ICD-10-CM | POA: Diagnosis not present

## 2015-01-11 DIAGNOSIS — N178 Other acute kidney failure: Secondary | ICD-10-CM | POA: Diagnosis not present

## 2015-01-11 DIAGNOSIS — R319 Hematuria, unspecified: Secondary | ICD-10-CM

## 2015-01-11 DIAGNOSIS — R05 Cough: Secondary | ICD-10-CM

## 2015-01-11 DIAGNOSIS — N3001 Acute cystitis with hematuria: Secondary | ICD-10-CM | POA: Diagnosis present

## 2015-01-11 DIAGNOSIS — N390 Urinary tract infection, site not specified: Secondary | ICD-10-CM

## 2015-01-11 DIAGNOSIS — I129 Hypertensive chronic kidney disease with stage 1 through stage 4 chronic kidney disease, or unspecified chronic kidney disease: Secondary | ICD-10-CM | POA: Diagnosis not present

## 2015-01-11 DIAGNOSIS — E876 Hypokalemia: Secondary | ICD-10-CM | POA: Diagnosis present

## 2015-01-11 DIAGNOSIS — D638 Anemia in other chronic diseases classified elsewhere: Secondary | ICD-10-CM | POA: Diagnosis present

## 2015-01-11 DIAGNOSIS — E039 Hypothyroidism, unspecified: Secondary | ICD-10-CM | POA: Diagnosis not present

## 2015-01-11 DIAGNOSIS — E1165 Type 2 diabetes mellitus with hyperglycemia: Secondary | ICD-10-CM | POA: Diagnosis present

## 2015-01-11 DIAGNOSIS — Z794 Long term (current) use of insulin: Secondary | ICD-10-CM

## 2015-01-11 DIAGNOSIS — L8990 Pressure ulcer of unspecified site, unspecified stage: Secondary | ICD-10-CM | POA: Insufficient documentation

## 2015-01-11 DIAGNOSIS — Z823 Family history of stroke: Secondary | ICD-10-CM | POA: Diagnosis not present

## 2015-01-11 DIAGNOSIS — N170 Acute kidney failure with tubular necrosis: Secondary | ICD-10-CM | POA: Diagnosis not present

## 2015-01-11 DIAGNOSIS — R339 Retention of urine, unspecified: Secondary | ICD-10-CM | POA: Clinically undetermined

## 2015-01-11 DIAGNOSIS — Z515 Encounter for palliative care: Secondary | ICD-10-CM | POA: Diagnosis not present

## 2015-01-11 DIAGNOSIS — G9340 Encephalopathy, unspecified: Secondary | ICD-10-CM | POA: Diagnosis present

## 2015-01-11 HISTORY — DX: Anemia, unspecified: D64.9

## 2015-01-11 HISTORY — DX: Gastro-esophageal reflux disease without esophagitis: K21.9

## 2015-01-11 HISTORY — DX: Major depressive disorder, single episode, unspecified: F32.9

## 2015-01-11 HISTORY — DX: Unspecified hearing loss, unspecified ear: H91.90

## 2015-01-11 HISTORY — DX: Unspecified mood (affective) disorder: F39

## 2015-01-11 HISTORY — DX: Depression, unspecified: F32.A

## 2015-01-11 HISTORY — DX: Essential (primary) hypertension: I10

## 2015-01-11 HISTORY — DX: Unspecified glaucoma: H40.9

## 2015-01-11 HISTORY — DX: Disorder of kidney and ureter, unspecified: N28.9

## 2015-01-11 HISTORY — DX: Anxiety disorder, unspecified: F41.9

## 2015-01-11 HISTORY — DX: Dysphagia, unspecified: R13.10

## 2015-01-11 HISTORY — DX: Carpal tunnel syndrome, unspecified upper limb: G56.00

## 2015-01-11 HISTORY — DX: Weakness: R53.1

## 2015-01-11 HISTORY — DX: Reserved for inherently not codable concepts without codable children: IMO0001

## 2015-01-11 LAB — BASIC METABOLIC PANEL
Anion gap: 10 (ref 5–15)
BUN: 120 mg/dL — AB (ref 6–20)
CALCIUM: 8.7 mg/dL — AB (ref 8.9–10.3)
CO2: 22 mmol/L (ref 22–32)
CREATININE: 4.61 mg/dL — AB (ref 0.44–1.00)
Chloride: 96 mmol/L — ABNORMAL LOW (ref 101–111)
GFR, EST AFRICAN AMERICAN: 10 mL/min — AB (ref >60)
GFR, EST NON AFRICAN AMERICAN: 8 mL/min — AB (ref >60)
Glucose, Bld: 431 mg/dL — ABNORMAL HIGH (ref 65–99)
Potassium: 3.8 mmol/L (ref 3.5–5.1)
SODIUM: 128 mmol/L — AB (ref 135–145)

## 2015-01-11 LAB — CBC WITH DIFFERENTIAL/PLATELET
BASOS PCT: 0 %
Basophils Absolute: 0 10*3/uL (ref 0–0.1)
EOS ABS: 0.3 10*3/uL (ref 0–0.7)
Eosinophils Relative: 3 %
HCT: 23 % — ABNORMAL LOW (ref 35.0–47.0)
HEMOGLOBIN: 7.7 g/dL — AB (ref 12.0–16.0)
LYMPHS ABS: 1 10*3/uL (ref 1.0–3.6)
Lymphocytes Relative: 7 %
MCH: 30.7 pg (ref 26.0–34.0)
MCHC: 33.3 g/dL (ref 32.0–36.0)
MCV: 92.1 fL (ref 80.0–100.0)
MONO ABS: 1.2 10*3/uL — AB (ref 0.2–0.9)
MONOS PCT: 9 %
NEUTROS PCT: 81 %
Neutro Abs: 10.8 10*3/uL — ABNORMAL HIGH (ref 1.4–6.5)
Platelets: 192 10*3/uL (ref 150–440)
RBC: 2.5 MIL/uL — ABNORMAL LOW (ref 3.80–5.20)
RDW: 13.7 % (ref 11.5–14.5)
WBC: 13.3 10*3/uL — ABNORMAL HIGH (ref 3.6–11.0)

## 2015-01-11 LAB — ABO/RH: ABO/RH(D): O POS

## 2015-01-11 LAB — TROPONIN I: TROPONIN I: 0.03 ng/mL (ref <0.031)

## 2015-01-11 LAB — LACTIC ACID, PLASMA: LACTIC ACID, VENOUS: 2.7 mmol/L — AB (ref 0.5–2.0)

## 2015-01-11 LAB — GLUCOSE, CAPILLARY: GLUCOSE-CAPILLARY: 422 mg/dL — AB (ref 65–99)

## 2015-01-11 MED ORDER — PIPERACILLIN-TAZOBACTAM 3.375 G IVPB 30 MIN
3.3750 g | Freq: Once | INTRAVENOUS | Status: AC
  Administered 2015-01-12: 3.375 g via INTRAVENOUS

## 2015-01-11 MED ORDER — SODIUM CHLORIDE 0.9 % IV SOLN
Freq: Once | INTRAVENOUS | Status: AC
  Administered 2015-01-11: 22:00:00 via INTRAVENOUS

## 2015-01-11 MED ORDER — VANCOMYCIN HCL IN DEXTROSE 1-5 GM/200ML-% IV SOLN
1000.0000 mg | Freq: Once | INTRAVENOUS | Status: AC
  Administered 2015-01-11: 1000 mg via INTRAVENOUS
  Filled 2015-01-11: qty 200

## 2015-01-11 MED ORDER — SODIUM CHLORIDE 0.9 % IV BOLUS (SEPSIS)
1000.0000 mL | Freq: Once | INTRAVENOUS | Status: AC
  Administered 2015-01-11: 1000 mL via INTRAVENOUS

## 2015-01-11 MED ORDER — DEXTROSE 5 % IV SOLN: 1.0000 g | Freq: Once | INTRAVENOUS | Status: DC

## 2015-01-11 NOTE — ED Notes (Addendum)
Dr. Shaune Pollack made aware of pts Lactic acid of 2.7

## 2015-01-11 NOTE — ED Provider Notes (Signed)
[facility] Emergency Department Provider Note   ____________________________________________  Time seen: 9 PM I have reviewed the triage vital signs and the triage nursing note.  HISTORY  Chief Complaint Altered Mental Status   Historian: Limited history due to patient confusion and deafness. Patient, limited historian. History obtained from nursing report who apparently spoke with a nurse from [facility].  HPI Gina Andersen is a 80 y.o. female who arrives from [facility] being sent over because of altered mental status, elevated blood sugars, and acute renal failure with blood work today returning showing a creatinine of 4 with a baseline of 1. Patient had been on Cipro for urinary tract infection, and reportedly the culture and sensitivity was pending. No reported vital sign abnormalities.      Past Medical History  Diagnosis Date  . Hypothyroidism 11/01/1995  . Anxiety 11/01/1995  . Diabetes mellitus type II 05/25/1996    insulin-requiring  . Hyperlipemia 06/25/2002  . Atopic dermatitis   . History of MRI of brain and brain stem 07/10/2005    w amd w/o no mass/infarct small vess dz  . Dehydration     Hosp [facility], hyperglycemia and ketoacidosis (stopped insulin)  . Pancytopenia 06/24-7/05/2007    Tissue D/O  . History of gallstones 11/21/2007    Abd U/S multip gallstones contracted GB CBD ULN Tr hydronephr R  . Acute cholecystitis 12/1/-05/20/08    [facility], s/p lap choleycystectomy CTD DM w/hypoglycemic sz in ER  . Gallstones 05/11/08    abd U/S gallstones pericholeycystic fluid [facility]  . Gastro-esophageal reflux disease without esophagitis   . Depression   . Anxiety disorder   . Dysphagia   . Hearing impaired   . Hypertension   . Carpal tunnel syndrome   . Weakness   . Anemia   . Glaucoma   . Mood disorder   . Kidney disease     Patient Active Problem List   Diagnosis Date Noted  . Nonspecific (abnormal)  findings on radiological and other examination of gastrointestinal tract 12/07/2012  . Septic shock(785.52) 12/04/2012  . UTI (lower urinary tract infection) 12/04/2012  . Colitis 12/04/2012  . DERMATITIS, ATOPIC 06/27/2010  . HEMORRHOIDS, WITH BLEEDING 11/27/2008  . Pain in limb 06/01/2008  . PANCYTOPENIA 11/28/2007  . 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 HEPATITIS B, HX OF 11/28/2007  . LEUKOPENIA, MILD 12/01/2006  . DISORDER, MENOPAUSAL NOS 09/08/2006  . HYPERLIPIDEMIA 06/25/2002  . DIABETES MELLITUS, TYPE II 05/25/1996  . HYPOTHYROIDISM 11/01/1995  . ANXIETY 11/01/1995    Past Surgical History  Procedure Laterality Date  . Partial hysterectomy      OV intact  . Carpel tunn      right  . Trigger finger release      left  . Ganglionectomy    . Cholecystectomy      Current Outpatient Rx  Name  Route  Sig  Dispense  Refill  . dextrose 5 % SOLN 50 mL with cefTRIAXone 2 G SOLR 2 g   Intravenous   Inject 2 g into the vein daily.         . fentaNYL (SUBLIMAZE) 0.05 MG/ML injection   Intravenous   Inject 0.5-1 mLs (25-50 mcg total) into the vein every 2 (two) hours as needed for severe pain.   2 mL   0   . glimepiride (AMARYL) 1 MG tablet               . insulin aspart (  NOVOLOG) 100 UNIT/ML injection   Subcutaneous   Inject 2-6 Units into the skin every 4 (four) hours.   1 vial   12   . levothyroxine (SYNTHROID, LEVOTHROID) 50 MCG tablet   Oral   Take 50 mcg by mouth daily before breakfast.         . LORazepam (ATIVAN) 0.5 MG tablet               . metFORMIN (GLUCOPHAGE-XR) 750 MG 24 hr tablet               . mupirocin ointment (BACTROBAN) 2 %   Topical   Apply 1 application topically 2 (two) times daily.   22 g   0   . NEXIUM 40 MG capsule               . nitrofurantoin, macrocrystal-monohydrate, (MACROBID) 100 MG capsule               . ondansetron (ZOFRAN) 8 MG tablet   Oral   Take 1 tablet (8 mg total) by mouth  every 6 (six) hours as needed.   20 tablet   0   . ondansetron (ZOFRAN) 8 MG/50ML SOLN   Intravenous   Inject 8 mg into the vein every 6 (six) hours as needed.         . pantoprazole (PROTONIX) 40 MG tablet      1 tablet PO day.         . potassium chloride SA (KLOR-CON M20) 20 MEQ tablet   Oral   Take 1 tablet (20 mEq total) by mouth 2 (two) times daily.   60 tablet   6   . pregabalin (LYRICA) 25 MG capsule   Oral   Take 25 mg by mouth 2 (two) times daily.         . sodium chloride 0.9 % infusion   Intravenous   Inject 10-20 mLs into the vein continuous.      0   . TRADJENTA 5 MG TABS tablet               . travoprost, benzalkonium, (TRAVATAN) 0.004 % ophthalmic solution   Both Eyes   Place 1 drop into both eyes at bedtime.            Allergies Hydrocodone-acetaminophen; Meloxicam; and Penicillins  Family History  Problem Relation Age of Onset  . Kidney disease Mother   . Diabetes Mother   . Hypertension Mother   . Diabetes Brother   . Stroke Brother   . Stroke Brother     CVA  . Diabetes Brother     Social History Social History  Substance Use Topics  . Smoking status: Never Smoker   . Smokeless tobacco: Never Used  . Alcohol Use: No    Review of Systems Limited but per patient Constitutional: Negative for fever. Eyes: Negative for visual changes. ENT: Negative for sore throat. Cardiovascular: Negative for chest pain. Respiratory: Negative for shortness of breath. Gastrointestinal: Patient reports mild midabdominal discomfort without any vomiting or diarrhea. Genitourinary: Positive for current treatment of UTI. Musculoskeletal: Negative for back pain. Skin: Negative for rash. Neurological: Negative for headaches, focal weakness or numbness. 10 point Review of Systems otherwise negative ____________________________________________   PHYSICAL EXAM:  VITAL SIGNS: ED Triage Vitals  Enc Vitals Group     BP 01/11/15 2020 92/49  mmHg     Pulse Rate 01/11/15 2020 76     Resp 01/11/15 2020 18  Temp 01/11/15 2020 99 F (37.2 C)     Temp Source 01/11/15 2020 Oral     SpO2 01/11/15 2020 96 %     Weight 01/11/15 2020 123 lb (55.792 kg)     Height 01/11/15 2020  (1.6 m)     Head Cir --      Peak Flow --      Pain Score --      Pain Loc --      Pain Edu? --      Excl. in GC? --      Constitutional: Alert and cooperative. Seems to understand and answer questions appropriately.. Well appearing and in no distress. Eyes: Conjunctivae are normal. PERRL. Normal extraocular movements. ENT   Head: Normocephalic and atraumatic.   Nose: No congestion/rhinnorhea.   Mouth/Throat: Mucous membranes are very dry. No teeth   Neck: No stridor. Cardiovascular/Chest: Normal rate, regular rhythm.  No murmurs, rubs, or gallops. Respiratory: Normal respiratory effort without tachypnea nor retractions. Breath sounds are clear and equal bilaterally. No wheezes/rales/rhonchi. Gastrointestinal: Soft. No distention, no guarding, no rebound. Mild tenderness in the epigastrium.  Genitourinary/rectal:Deferred Musculoskeletal: Nontender with normal range of motion in all extremities. No joint effusions.  No lower extremity tenderness nor edema. Neurologic:  No gross or focal neurologic deficits are appreciated. Skin:  Skin is warm, dry and intact. No rash noted. Psychiatric: Mood and affect appear to be normal. ____________________________________________   EKG I, Governor Rooks, MD, the attending physician have personally viewed and interpreted all ECGs.  73 bpm. Normal sinus rhythm. Narrow QRS. Normal axis. Normal ST and T-wave. ____________________________________________  LABS (pertinent positives/negatives)  Lactic acid 2.7 Urinalysis positive for too numerous to count red blood cells and white blood cells and many bacteria Basic metabolic panel significant for sodium 128, chloride 96, glucose 431, BUN 120 and  creatinine 4.61. Anion gap 10 Troponin 0.03 White blood cell count 13.3 with neutrophils 10.8. Hemoglobin 7.7, platelets 192   ____________________________________________  RADIOLOGY All Xrays were viewed by me. Imaging interpreted by Radiologist.  Chest x-ray portable:  IMPRESSION: Cardiomegaly. Mild interstitial pulmonary opacities suggestive of mild edema.  __________________________________________  PROCEDURES  Procedure(s) performed: None Critical Care performed: CRITICAL CARE Performed by: Governor Rooks   Total critical care time: 30 minutes.  Critical care time was exclusive of separately billable procedures and treating other patients.  Critical care was necessary to treat or prevent imminent or life-threatening deterioration.  Critical care was time spent personally by me on the following activities: development of treatment plan with patient and/or surrogate as well as nursing, discussions with consultants, evaluation of patient's response to treatment, examination of patient, obtaining history from patient or surrogate, ordering and performing treatments and interventions, ordering and review of laboratory studies, ordering and review of radiographic studies, pulse oximetry and re-evaluation of patient's condition.   ____________________________________________   ED COURSE / ASSESSMENT AND PLAN  CONSULTATIONS: Phone consultation with hospitalist for admission  Pertinent labs & imaging results that were available during my care of the patient were reviewed by me and considered in my medical decision making (see chart for details).  Patient arrived with that concern about acute renal failure. This was confirmed on laboratory evaluation. She was started on 1 L normal saline bolus. After seeing her lactate elevated, she was written to receive a second liter normal saline bolus which will put her at a total of 30 cc/kg bolus for sepsis. I'm considering diagnosis of  sepsis possible given the fact that  she does have a urinary tract infection which has failed outpatient therapy with Cipro, and have blood pressure initially of 94 systolic. She will be treated with sepsis broad-spectrum coverage with Zosyn and vancomycin. Although there was report the patient was altered and possible "psychosis ", the patient seems alert and appropriate and answers simple questions easily. She is a poor historian overall, however she answers simple questions very properly. No focal neurologic deficit on exam I don't think a CT of the head is warranted this point time.  Her renal failure was confirmed on labs. She is being hydrated initially.   Patient / Family / Caregiver informed of clinical course, medical decision-making process, and agree with plan.    ___________________________________________   FINAL CLINICAL IMPRESSION(S) / ED DIAGNOSES   Final diagnoses:  Sepsis, due to unspecified organism  Acute renal failure, unspecified acute renal failure type  Urinary tract infection with hematuria, site unspecified      Governor Rooks, MD 01/11/15 2257

## 2015-01-11 NOTE — H&P (Signed)
[facility] - [place] at [facility]   PATIENT NAME: Gina Andersen    MR#:  [id number]  DATE OF BIRTH:  [birth year]  DATE OF ADMISSION:  01/11/2015  PRIMARY CARE PHYSICIAN: Nelda Bucks., MD   REQUESTING/REFERRING PHYSICIAN: Governor Rooks  CHIEF COMPLAINT:   Chief Complaint  Patient presents with  . Altered Mental Status   abnormal kidney function  HISTORY OF PRESENT ILLNESS:  Gina Andersen  is a 80 y.o. female, nursing home resident, with a known history of diabetes mellitus type 2, hypertension, hyperlipidemia, hypothyroidism, anxiety disorder, chronic anemia was sent in from nursing home because of altered sensorium and abnormal kidney function with creatinine of 4.0 (baseline creatinine 1.0). Patient is a poor historian and the history I got is from the ED physician's note and nursing home transfer summary. Patient's family is not available at the current time. On arrival to the ED patient was noted to have a temperature of 73F, blood pressure 92/49. Workup revealed WBC of 13.3, BUN 120, creatinine 4.61, blood sugar 422, anion gap 10, elevated lactic acid of 2.7, H and H7.7/23.0 and abnormal urinalysis with many bacteria, too numerous to count WBC. Chest x-ray mild edema, EKG normal sinus rhythm with ventricular rate of 73 bpm. Patient was given IV fluids and after obtaining blood and urine cultures was started on broad-spectrum antibiotics-vancomycin and Zosyn. Patient remained alert and awake and able to follow commands. Hospitalist service was consulted for further management. Patient at the current time remains stable, alert awake and following commands appropriately. According to the nursing home transfer summary patient was being treated with oral Cipro for a UTI.  PAST MEDICAL HISTORY:   Past Medical History  Diagnosis Date  . Hypothyroidism 11/01/1995  . Anxiety 11/01/1995  . Diabetes mellitus type II 05/25/1996    insulin-requiring  . Hyperlipemia  06/25/2002  . Atopic dermatitis   . History of MRI of brain and brain stem 07/10/2005    w amd w/o no mass/infarct small vess dz  . Dehydration     Hosp [facility], hyperglycemia and ketoacidosis (stopped insulin)  . Pancytopenia 06/24-7/05/2007    Tissue D/O  . History of gallstones 11/21/2007    Abd U/S multip gallstones contracted GB CBD ULN Tr hydronephr R  . Acute cholecystitis 12/1/-05/20/08    [facility], s/p lap choleycystectomy CTD DM w/hypoglycemic sz in ER  . Gallstones 05/11/08    abd U/S gallstones pericholeycystic fluid [facility]  . Gastro-esophageal reflux disease without esophagitis   . Depression   . Anxiety disorder   . Dysphagia   . Hearing impaired   . Hypertension   . Carpal tunnel syndrome   . Weakness   . Anemia   . Glaucoma   . Mood disorder   . Kidney disease     PAST SURGICAL HISTORY:   Past Surgical History  Procedure Laterality Date  . Partial hysterectomy      OV intact  . Carpel tunn      right  . Trigger finger release      left  . Ganglionectomy    . Cholecystectomy    . Abdominal hysterectomy      SOCIAL HISTORY:   Social History  Substance Use Topics  . Smoking status: Never Smoker   . Smokeless tobacco: Never Used  . Alcohol Use: No    FAMILY HISTORY:   Family History  Problem Relation Age of Onset  . Kidney disease Mother   . Diabetes Mother   .  Hypertension Mother   . Diabetes Brother   . Stroke Brother   . Stroke Brother     CVA  . Diabetes Brother     DRUG ALLERGIES:   Allergies  Allergen Reactions  . Hydrocodone-Acetaminophen Other (See Comments)    Reaction:  Dizziness   . Meloxicam Nausea And Vomiting  . Penicillins Rash    REVIEW OF SYSTEMS:   Review of Systems  Unable to perform ROS: medical condition   patient is a poor historian.  MEDICATIONS AT HOME:   Prior to Admission medications   Medication Sig Start Date End Date Taking? Authorizing Provider  dextrose 5 % SOLN 50 mL with cefTRIAXone 2 G SOLR  2 g Inject 2 g into the vein daily. 12/08/12   Tommie Sams, DO  fentaNYL (SUBLIMAZE) 0.05 MG/ML injection Inject 0.5-1 mLs (25-50 mcg total) into the vein every 2 (two) hours as needed for severe pain. 12/08/12   Tommie Sams, DO  glimepiride (AMARYL) 1 MG tablet  06/28/13   Historical Provider, MD  insulin aspart (NOVOLOG) 100 UNIT/ML injection Inject 2-6 Units into the skin every 4 (four) hours. 12/08/12   Tommie Sams, DO  levothyroxine (SYNTHROID, LEVOTHROID) 50 MCG tablet Take 50 mcg by mouth daily before breakfast.    Historical Provider, MD  LORazepam (ATIVAN) 0.5 MG tablet  06/28/13   Historical Provider, MD  metFORMIN (GLUCOPHAGE-XR) 750 MG 24 hr tablet  08/03/13   Historical Provider, MD  mupirocin ointment (BACTROBAN) 2 % Apply 1 application topically 2 (two) times daily. 12/08/12   Tommie Sams, DO  NEXIUM 40 MG capsule  07/14/13   Historical Provider, MD  nitrofurantoin, macrocrystal-monohydrate, (MACROBID) 100 MG capsule  07/31/13   Historical Provider, MD  ondansetron (ZOFRAN) 8 MG tablet Take 1 tablet (8 mg total) by mouth every 6 (six) hours as needed. 12/08/12   Jayce G Cook, DO  ondansetron (ZOFRAN) 8 MG/50ML SOLN Inject 8 mg into the vein every 6 (six) hours as needed. 12/08/12   Verdis Frederickson Cook, DO  pantoprazole (PROTONIX) 40 MG tablet 1 tablet PO day. 12/08/12   Tommie Sams, DO  potassium chloride SA (KLOR-CON M20) 20 MEQ tablet Take 1 tablet (20 mEq total) by mouth 2 (two) times daily. 02/26/11   Arta Silence, MD  pregabalin (LYRICA) 25 MG capsule Take 25 mg by mouth 2 (two) times daily.    Historical Provider, MD  sodium chloride 0.9 % infusion Inject 10-20 mLs into the vein continuous. 12/08/12   Tommie Sams, DO  TRADJENTA 5 MG TABS tablet  08/04/13   Historical Provider, MD  travoprost, benzalkonium, (TRAVATAN) 0.004 % ophthalmic solution Place 1 drop into both eyes at bedtime.     Historical Provider, MD      VITAL SIGNS:  Blood pressure 99/55, pulse 79, temperature 98.2 F (36.8  C), temperature source Oral, resp. rate 14, height  (1.6 m), weight 55.792 kg (123 lb), SpO2 100 %.  PHYSICAL EXAMINATION:  Physical Exam  Constitutional: No distress.  Thin built  HENT:  Head: Normocephalic and atraumatic.  Right Ear: External ear normal.  Left Ear: External ear normal.  Nose: Nose normal.  Mouth/Throat: Oropharynx is clear and moist. No oropharyngeal exudate.  Eyes: EOM are normal. Pupils are equal, round, and reactive to light. No scleral icterus.  Neck: Normal range of motion. Neck supple. No JVD present. No thyromegaly present.  Cardiovascular: Normal rate, regular rhythm, normal heart sounds and intact distal  pulses.  Exam reveals no friction rub.   No murmur heard. Respiratory: Effort normal and breath sounds normal. No respiratory distress. She has no wheezes. She has no rales. She exhibits no tenderness.  GI: Soft. Bowel sounds are normal. She exhibits no distension and no mass. There is no tenderness. There is no rebound and no guarding.  Musculoskeletal: Normal range of motion. She exhibits no edema.  Lymphadenopathy:    She has no cervical adenopathy.  Neurological: She is alert. She has normal reflexes. She displays normal reflexes. No cranial nerve deficit. She exhibits normal muscle tone.  Oriented to self  Skin: Skin is warm. No rash noted. No erythema.  Psychiatric:  Unable to perform   LABORATORY PANEL:   CBC  Recent Labs Lab 01/11/15 2011  WBC 13.3*  HGB 7.7*  HCT 23.0*  PLT 192   ------------------------------------------------------------------------------------------------------------------  Chemistries   Recent Labs Lab 01/11/15 2011  NA 128*  K 3.8  CL 96*  CO2 22  GLUCOSE 431*  BUN 120*  CREATININE 4.61*  CALCIUM 8.7*   ------------------------------------------------------------------------------------------------------------------  Cardiac Enzymes  Recent Labs Lab 01/11/15 2011  TROPONINI 0.03    ------------------------------------------------------------------------------------------------------------------  RADIOLOGY:  Dg Chest Port 1 View  01/11/2015   CLINICAL DATA:  Patient with altered mental status.  EXAM: PORTABLE CHEST - 1 VIEW  COMPARISON:  Chest radiograph 08/10/2013  FINDINGS: Monitoring leads overlie the patient. Stable enlarged cardiac and mediastinal contours. Pulmonary vascular redistribution and bilateral predominately perihilar interstitial pulmonary opacities.  IMPRESSION: Cardiomegaly. Mild interstitial pulmonary opacities suggestive of mild edema.   Electronically Signed   By: Annia Belt M.D.   On: 01/11/2015 22:44    EKG:   Orders placed or performed in visit on 08/10/13  . EKG 12-Lead  Normal sinus rhythm with ventricular rate of 73 bpm, no acute ischemic changes.  IMPRESSION AND PLAN:   80 year old female, nursing home resident with history of multiple medical problems, sent from nursing home because of altered sensorium and abnormal kidney function with creatinine of 4.160. 1. Sepsis due to UTI. 2. Acute kidney injury with current creatinine 4.62, baseline creatinine 1.0. 3. Acute encephalopathy secondary to combination of sepsis, dehydration and acute kidney injury. Plan: Admit, continue IV hydration, follow-up I and os, continue IV antibiotics-vancomycin and Zosyn, follow-up CBC, blood and urine cultures, CMP. Nephrology consultation requested for further advice. 4. Uncontrolled diabetes mellitus type 2. Hold oral antidiabetic meds for now because of acute kidney injury and acute illness. Sliding scale insulin, monitor blood sugars closely. 5. Hyponatremia with sodium of 128, secondary to hyper glycemia and dehydration. Plan: IV hydration, SSI, follow-up BMP. 6. Anemia, chronic. H&H 7. 7/20 3.0. No active bleeding. Monitor H&H closely. 7. Hypertension, currently BP is low normal. Monitor BP closely.  8. Hypothyroidism,on levothyroxine. Continue  same. 9. Anxiety, on lorazepam when necessary. Continue same as needed.    All the records are reviewed and case discussed with ED provider.  CODE STATUS: Full code  TOTAL TIME TAKING CARE OF THIS PATIENT: 50 minutes.    Crissie Figures M.D on 01/11/2015 at 11:53 PM  Between 7am to 6pm - Pager - [phone]  After 6pm go to [email or web address] - password [email or web address] [facility]  [facility]  Office  [phone]  CC: Primary care physician; Nelda Bucks., MD

## 2015-01-11 NOTE — ED Notes (Addendum)
Patient presents to ED from [facility] center; sent over by [facility], NP for evaluation. Patient with acute psychosis - refusing medications, including insulin. Family in denial about psychosis. CBGs have been running over 600; exercise caution when treating glucose because patient is prone to HYPOglycemia with minimal intervention. Patient with a UTI and has been on Cipro. NP believes that Cipro has failed to treat patient; repeat UA and C&S pending with [facility] lab. Patient in ARF with a Creatinine of 4; baseline is 1. Patient with worsening anemia; Hgb has dropped from 10 to 7.7 without known cause other than hemorrhoids; platelets 80. **PATIENT IS DEAF, but reads lips very well per NP report**. Charge nurse has NPs personal contact information should  EDP need to speak with provider further regarding this patient.

## 2015-01-11 NOTE — ED Notes (Signed)
See note from [facility]

## 2015-01-12 ENCOUNTER — Inpatient Hospital Stay: Payer: Medicare Other

## 2015-01-12 DIAGNOSIS — L8990 Pressure ulcer of unspecified site, unspecified stage: Secondary | ICD-10-CM | POA: Insufficient documentation

## 2015-01-12 LAB — URINALYSIS COMPLETE WITH MICROSCOPIC (ARMC ONLY)
Specific Gravity, Urine: 1.015 (ref 1.005–1.030)
pH: 0 — ABNORMAL LOW (ref 5.0–8.0)

## 2015-01-12 LAB — COMPREHENSIVE METABOLIC PANEL
ALK PHOS: 150 U/L — AB (ref 38–126)
ALT: 11 U/L — ABNORMAL LOW (ref 14–54)
ANION GAP: 5 (ref 5–15)
AST: 30 U/L (ref 15–41)
Albumin: 1.8 g/dL — ABNORMAL LOW (ref 3.5–5.0)
BILIRUBIN TOTAL: 0.4 mg/dL (ref 0.3–1.2)
BUN: 98 mg/dL — ABNORMAL HIGH (ref 6–20)
CALCIUM: 8 mg/dL — AB (ref 8.9–10.3)
CO2: 22 mmol/L (ref 22–32)
Chloride: 102 mmol/L (ref 101–111)
Creatinine, Ser: 4.2 mg/dL — ABNORMAL HIGH (ref 0.44–1.00)
GFR calc non Af Amer: 9 mL/min — ABNORMAL LOW (ref >60)
GFR, EST AFRICAN AMERICAN: 11 mL/min — AB (ref >60)
Glucose, Bld: 437 mg/dL — ABNORMAL HIGH (ref 65–99)
Potassium: 3.5 mmol/L (ref 3.5–5.1)
SODIUM: 129 mmol/L — AB (ref 135–145)
TOTAL PROTEIN: 5.4 g/dL — AB (ref 6.5–8.1)

## 2015-01-12 LAB — GLUCOSE, CAPILLARY
GLUCOSE-CAPILLARY: 351 mg/dL — AB (ref 65–99)
GLUCOSE-CAPILLARY: 326 mg/dL — AB (ref 65–99)
Glucose-Capillary: 431 mg/dL — ABNORMAL HIGH (ref 65–99)
Glucose-Capillary: 391 mg/dL — ABNORMAL HIGH (ref 65–99)
Glucose-Capillary: 420 mg/dL — ABNORMAL HIGH (ref 65–99)
Glucose-Capillary: 326 mg/dL — ABNORMAL HIGH (ref 65–99)

## 2015-01-12 LAB — IRON AND TIBC
IRON: 20 ug/dL — AB (ref 28–170)
Saturation Ratios: 11 % (ref 10.4–31.8)
TIBC: 177 ug/dL — ABNORMAL LOW (ref 250–450)
UIBC: 157 ug/dL

## 2015-01-12 LAB — CBC
HCT: 21.8 % — ABNORMAL LOW (ref 35.0–47.0)
HEMOGLOBIN: 7.4 g/dL — AB (ref 12.0–16.0)
MCH: 31.4 pg (ref 26.0–34.0)
MCHC: 34 g/dL (ref 32.0–36.0)
MCV: 92.3 fL (ref 80.0–100.0)
Platelets: 174 10*3/uL (ref 150–440)
RBC: 2.37 MIL/uL — AB (ref 3.80–5.20)
RDW: 13.8 % (ref 11.5–14.5)
WBC: 10 10*3/uL (ref 3.6–11.0)

## 2015-01-12 LAB — PROTEIN / CREATININE RATIO, URINE
CREATININE, URINE: 68 mg/dL
Protein Creatinine Ratio: 0.93 mg/mg{Cre} — ABNORMAL HIGH (ref 0.00–0.15)
TOTAL PROTEIN, URINE: 63 mg/dL

## 2015-01-12 LAB — MRSA PCR SCREENING: MRSA BY PCR: POSITIVE — AB

## 2015-01-12 LAB — LACTIC ACID, PLASMA: Lactic Acid, Venous: 1.3 mmol/L (ref 0.5–2.0)

## 2015-01-12 LAB — FERRITIN: Ferritin: 315 ng/mL — ABNORMAL HIGH (ref 11–307)

## 2015-01-12 LAB — OCCULT BLOOD X 1 CARD TO LAB, STOOL: FECAL OCCULT BLD: NEGATIVE

## 2015-01-12 MED ORDER — SODIUM CHLORIDE 0.9 % IV SOLN
INTRAVENOUS | Status: AC
  Administered 2015-01-12: 01:00:00 via INTRAVENOUS

## 2015-01-12 MED ORDER — SODIUM CHLORIDE 0.9 % IV SOLN
INTRAVENOUS | Status: DC
  Administered 2015-01-12 – 2015-01-14 (×5): via INTRAVENOUS

## 2015-01-12 MED ORDER — PANTOPRAZOLE SODIUM 40 MG PO TBEC
40.0000 mg | DELAYED_RELEASE_TABLET | Freq: Every day | ORAL | Status: DC
  Administered 2015-01-12 – 2015-01-18 (×7): 40 mg via ORAL
  Filled 2015-01-12 (×7): qty 1

## 2015-01-12 MED ORDER — ONDANSETRON HCL 4 MG/2ML IJ SOLN: 4.0000 mg | Freq: Four times a day (QID) | INTRAMUSCULAR | Status: DC | PRN

## 2015-01-12 MED ORDER — PANTOPRAZOLE SODIUM 40 MG PO TBEC: 40.0000 mg | DELAYED_RELEASE_TABLET | Freq: Every day | ORAL | Status: DC

## 2015-01-12 MED ORDER — ACETAMINOPHEN 650 MG RE SUPP: 650.0000 mg | Freq: Four times a day (QID) | RECTAL | Status: DC | PRN

## 2015-01-12 MED ORDER — PREGABALIN 25 MG PO CAPS
25.0000 mg | ORAL_CAPSULE | Freq: Two times a day (BID) | ORAL | Status: DC
  Administered 2015-01-12 – 2015-01-18 (×13): 25 mg via ORAL
  Filled 2015-01-12 (×13): qty 1

## 2015-01-12 MED ORDER — VANCOMYCIN HCL 500 MG IV SOLR
500.0000 mg | INTRAVENOUS | Status: DC
  Filled 2015-01-12: qty 500

## 2015-01-12 MED ORDER — HEPARIN SODIUM (PORCINE) 5000 UNIT/ML IJ SOLN
5000.0000 [IU] | Freq: Three times a day (TID) | INTRAMUSCULAR | Status: DC
  Administered 2015-01-12 – 2015-01-13 (×4): 5000 [IU] via SUBCUTANEOUS
  Filled 2015-01-12 (×4): qty 1

## 2015-01-12 MED ORDER — INSULIN ASPART 100 UNIT/ML ~~LOC~~ SOLN
0.0000 [IU] | Freq: Three times a day (TID) | SUBCUTANEOUS | Status: DC
  Administered 2015-01-12 (×2): 9 [IU] via SUBCUTANEOUS
  Administered 2015-01-12: 7 [IU] via SUBCUTANEOUS
  Administered 2015-01-13: 9 [IU] via SUBCUTANEOUS
  Administered 2015-01-13 (×2): 7 [IU] via SUBCUTANEOUS
  Filled 2015-01-12 (×3): qty 9
  Filled 2015-01-12 (×2): qty 7

## 2015-01-12 MED ORDER — SODIUM CHLORIDE 0.9 % IJ SOLN
3.0000 mL | Freq: Two times a day (BID) | INTRAMUSCULAR | Status: DC
  Administered 2015-01-12 – 2015-01-18 (×8): 3 mL via INTRAVENOUS

## 2015-01-12 MED ORDER — INSULIN ASPART 100 UNIT/ML ~~LOC~~ SOLN
10.0000 [IU] | Freq: Once | SUBCUTANEOUS | Status: AC
  Administered 2015-01-12: 02:00:00 10 [IU] via SUBCUTANEOUS
  Filled 2015-01-12: qty 10

## 2015-01-12 MED ORDER — LEVOTHYROXINE SODIUM 50 MCG PO TABS
50.0000 ug | ORAL_TABLET | Freq: Every day | ORAL | Status: DC
  Administered 2015-01-12 – 2015-01-14 (×3): 50 ug via ORAL
  Filled 2015-01-12 (×3): qty 1

## 2015-01-12 MED ORDER — PIPERACILLIN SOD-TAZOBACTAM SO 3.375 (3-0.375) G IV SOLR
INTRAVENOUS | Status: AC
  Filled 2015-01-12: qty 3.38

## 2015-01-12 MED ORDER — ACETAMINOPHEN 325 MG PO TABS
650.0000 mg | ORAL_TABLET | Freq: Four times a day (QID) | ORAL | Status: DC | PRN
  Administered 2015-01-12 – 2015-01-17 (×4): 650 mg via ORAL
  Filled 2015-01-12 (×5): qty 2

## 2015-01-12 MED ORDER — LORAZEPAM 0.5 MG PO TABS
0.5000 mg | ORAL_TABLET | Freq: Four times a day (QID) | ORAL | Status: DC | PRN
  Administered 2015-01-12: 0.5 mg via ORAL
  Filled 2015-01-12: qty 1

## 2015-01-12 MED ORDER — PIPERACILLIN-TAZOBACTAM 3.375 G IVPB
3.3750 g | Freq: Two times a day (BID) | INTRAVENOUS | Status: DC
  Administered 2015-01-12 – 2015-01-14 (×5): 3.375 g via INTRAVENOUS
  Filled 2015-01-12 (×7): qty 50

## 2015-01-12 MED ORDER — ONDANSETRON HCL 4 MG PO TABS: 4.0000 mg | ORAL_TABLET | Freq: Four times a day (QID) | ORAL | Status: DC | PRN

## 2015-01-12 MED ORDER — INSULIN GLARGINE 100 UNIT/ML ~~LOC~~ SOLN
10.0000 [IU] | Freq: Every day | SUBCUTANEOUS | Status: DC
  Administered 2015-01-12 – 2015-01-13 (×2): 10 [IU] via SUBCUTANEOUS
  Filled 2015-01-12 (×4): qty 0.1

## 2015-01-12 MED ORDER — PNEUMOCOCCAL VAC POLYVALENT 25 MCG/0.5ML IJ INJ
0.5000 mL | INJECTION | INTRAMUSCULAR | Status: AC
  Administered 2015-01-13: 0.5 mL via INTRAMUSCULAR
  Filled 2015-01-12: qty 0.5

## 2015-01-12 MED ORDER — ASPIRIN EC 81 MG PO TBEC
81.0000 mg | DELAYED_RELEASE_TABLET | Freq: Every day | ORAL | Status: DC
  Administered 2015-01-12 – 2015-01-13 (×2): 81 mg via ORAL
  Filled 2015-01-12 (×2): qty 1

## 2015-01-12 MED ORDER — LATANOPROST 0.005 % OP SOLN
1.0000 [drp] | Freq: Every day | OPHTHALMIC | Status: DC
  Administered 2015-01-13 – 2015-01-16 (×4): 1 [drp] via OPHTHALMIC
  Filled 2015-01-12: qty 2.5

## 2015-01-12 NOTE — Progress Notes (Signed)
Called Dr Renae Gloss told him a set of the patients blood cultures came back as positive for gram positive rods.

## 2015-01-12 NOTE — Consult Note (Signed)
[facility] CONSULT NOTE    Date: 01/12/2015                  Patient Name:  Gina Andersen  MRN: [id number]  DOB: 06/17/1934  Age / Sex: 80 y.o., female         PCP: Raylene Miyamoto., MD                 Service Requesting Consult: Dr. Reddy/hospitalist                 Reason for Consult: Acute renal failure            History of Present Illness: Patient is a 80 y.o. female with a PMHx of diabetes mellitus type II, hyperlipidemia, atropic dermatitis, history of cholecystitis, depression, anxiety, hypertension, CKD stage III baseline egfr 11, who was admitted to [facility] on 01/11/2015 for evaluation of altered mental status.  Patient is a poor historian and unable to offer accurate history at this point in time.  She is a nursing home resident.  UA revealed TNTC WBCs along with bacteruria.  She has been started on zosyn and vancomycin.  We're asked to see her for acute renal failure in the setting of CKD stage III.  Cr was 4.61 upon admission, now down to 4.20 with IVF hydration.  No renal ultrasound available thus far.     Medications: Outpatient medications: Prescriptions prior to admission  Medication Sig Dispense Refill Last Dose  . dextrose 5 % SOLN 50 mL with cefTRIAXone 2 G SOLR 2 g Inject 2 g into the vein daily.   Taking  . fentaNYL (SUBLIMAZE) 0.05 MG/ML injection Inject 0.5-1 mLs (25-50 mcg total) into the vein every 2 (two) hours as needed for severe pain. 2 mL 0 Taking  . glimepiride (AMARYL) 1 MG tablet    Taking  . insulin aspart (NOVOLOG) 100 UNIT/ML injection Inject 2-6 Units into the skin every 4 (four) hours. 1 vial 12 Taking  . levothyroxine (SYNTHROID, LEVOTHROID) 50 MCG tablet Take 50 mcg by mouth daily before breakfast.   Taking  . LORazepam (ATIVAN) 0.5 MG tablet    Taking  . metFORMIN (GLUCOPHAGE-XR) 750 MG 24 hr tablet    Taking  . mupirocin ointment (BACTROBAN) 2 % Apply 1 application topically 2 (two) times daily. 22 g 0 Taking  .  NEXIUM 40 MG capsule    Taking  . nitrofurantoin, macrocrystal-monohydrate, (MACROBID) 100 MG capsule    Taking  . ondansetron (ZOFRAN) 8 MG tablet Take 1 tablet (8 mg total) by mouth every 6 (six) hours as needed. 20 tablet 0 Taking  . ondansetron (ZOFRAN) 8 MG/50ML SOLN Inject 8 mg into the vein every 6 (six) hours as needed.   Taking  . pantoprazole (PROTONIX) 40 MG tablet 1 tablet PO day.   Taking  . potassium chloride SA (KLOR-CON M20) 20 MEQ tablet Take 1 tablet (20 mEq total) by mouth 2 (two) times daily. 60 tablet 6 Taking  . pregabalin (LYRICA) 25 MG capsule Take 25 mg by mouth 2 (two) times daily.   Taking  . sodium chloride 0.9 % infusion Inject 10-20 mLs into the vein continuous.  0 Taking  . TRADJENTA 5 MG TABS tablet    Taking  . travoprost, benzalkonium, (TRAVATAN) 0.004 % ophthalmic solution Place 1 drop into both eyes at bedtime.    Taking    Current medications: Current Facility-Administered Medications  Medication Dose Route Frequency Provider Last Rate Last Dose  .  acetaminophen (TYLENOL) tablet 650 mg  650 mg Oral Q6H PRN Juluis Mire, MD   650 mg at 01/12/15 0143   Or  . acetaminophen (TYLENOL) suppository 650 mg  650 mg Rectal Q6H PRN Juluis Mire, MD      . aspirin EC tablet 81 mg  81 mg Oral Daily Juluis Mire, MD   81 mg at 01/12/15 0915  . heparin injection 5,000 Units  5,000 Units Subcutaneous 3 times per day Juluis Mire, MD   5,000 Units at 01/12/15 1340  . insulin aspart (novoLOG) injection 0-9 Units  0-9 Units Subcutaneous TID WC Juluis Mire, MD   9 Units at 01/12/15 1123  . latanoprost (XALATAN) 0.005 % ophthalmic solution 1 drop  1 drop Both Eyes QHS Juluis Mire, MD   1 drop at 01/12/15 0100  . levothyroxine (SYNTHROID, LEVOTHROID) tablet 50 mcg  50 mcg Oral QAC breakfast Juluis Mire, MD   50 mcg at 01/12/15 0915  . LORazepam (ATIVAN) tablet 0.5 mg  0.5 mg Oral Q6H PRN Juluis Mire, MD   0.5 mg at 01/12/15 0142  .  ondansetron (ZOFRAN) tablet 4 mg  4 mg Oral Q6H PRN Juluis Mire, MD       Or  . ondansetron [facility]) injection 4 mg  4 mg Intravenous Q6H PRN Juluis Mire, MD      . pantoprazole (PROTONIX) EC tablet 40 mg  40 mg Oral QAC breakfast Juluis Mire, MD   40 mg at 01/12/15 0915  . piperacillin-tazobactam (ZOSYN) IVPB 3.375 g  3.375 g Intravenous Q12H Juluis Mire, MD   3.375 g at 01/12/15 1208  . [START ON 01/13/2015] pneumococcal 23 valent vaccine (PNU-IMMUNE) injection 0.5 mL  0.5 mL Intramuscular Tomorrow-1000 Loletha Grayer, MD      . pregabalin (LYRICA) capsule 25 mg  25 mg Oral BID Juluis Mire, MD   25 mg at 01/12/15 1207  . sodium chloride 0.9 % injection 3 mL  3 mL Intravenous Q12H Juluis Mire, MD   3 mL at 01/12/15 0920  . [START ON 01/13/2015] vancomycin (VANCOCIN) 500 mg in sodium chloride 0.9 % 100 mL IVPB  500 mg Intravenous Q48H Charlett Nose, [facility]          Allergies: Allergies  Allergen Reactions  . Hydrocodone-Acetaminophen Other (See Comments)    Reaction:  Dizziness   . Meloxicam Nausea And Vomiting  . Penicillins Rash      Past Medical History: Past Medical History  Diagnosis Date  . Hypothyroidism 11/01/1995  . Anxiety 11/01/1995  . Diabetes mellitus type II 05/25/1996    insulin-requiring  . Hyperlipemia 06/25/2002  . Atopic dermatitis   . History of MRI of brain and brain stem 07/10/2005    w amd w/o no mass/infarct small vess dz  . Dehydration     Hosp [facility], hyperglycemia and ketoacidosis (stopped insulin)  . Pancytopenia 06/24-7/05/2007    Tissue D/O  . History of gallstones 11/21/2007    Abd U/S multip gallstones contracted GB CBD ULN Tr hydronephr R  . Acute cholecystitis 12/1/-05/20/08    [facility], s/p lap choleycystectomy CTD DM w/hypoglycemic sz in ER  . Gallstones 05/11/08    abd U/S gallstones pericholeycystic fluid [facility]  . Gastro-esophageal reflux disease without esophagitis   . Depression   . Anxiety disorder   .  Dysphagia   . Hearing impaired   . Hypertension   . Carpal tunnel syndrome   .  Weakness   . Anemia   . Glaucoma   . Mood disorder   . Kidney disease      Past Surgical History: Past Surgical History  Procedure Laterality Date  . Partial hysterectomy      OV intact  . Carpel tunn      right  . Trigger finger release      left  . Ganglionectomy    . Cholecystectomy    . Abdominal hysterectomy       Family History: Family History  Problem Relation Age of Onset  . Kidney disease Mother   . Diabetes Mother   . Hypertension Mother   . Diabetes Brother   . Stroke Brother   . Stroke Brother     CVA  . Diabetes Brother      Social History: Social History   Social History  . Marital Status: Divorced    Spouse Name: N/A  . Number of Children: 4  . Years of Education: N/A   Occupational History  . Disability     Retired disability hand injury via Tree surgeon   Social History Main Topics  . Smoking status: Never Smoker   . Smokeless tobacco: Never Used  . Alcohol Use: No  . Drug Use: No  . Sexual Activity: Not on file   Other Topics Concern  . Not on file   Social History Narrative   Divorced 1979, 4  Children, 2 marriages reported ending in divorce.   The patient is currently living with her son who is her primary caretaker, son's name is Aurther Loft     Review of Systems: Pt unable to provide accurate ROS due to altered mental status.  Vital Signs: Blood pressure 94/45, pulse 73, temperature 98.3 F (36.8 C), temperature source Oral, resp. rate 20, height 5\' 3"  (1.6 m), weight 56.7 kg (125 lb), SpO2 97 %.  Weight trends: Filed Weights   01/11/15 2020 01/12/15 0500  Weight: 55.792 kg (123 lb) 56.7 kg (125 lb)    Physical Exam: General: NAD, chronically ill appearing  Head: Normocephalic, atraumatic.  Eyes: Anicteric, EOMI  Nose: dry membranes moist, not inflammed, nonerythematous.  Throat: Oral mucoas dry.    Neck: supple  Lungs:  CTAB  normal effort  Heart: S1S2 no rubs  Abdomen:  Soft NTND BS present  Extremities: No pretibial edema.  Neurologic: Alert but disoriented, following simple commands  Skin: No visible rashes, scars.    Lab results: Basic Metabolic Panel:  Recent Labs Lab 01/11/15 2011 01/12/15 0121  NA 128* 129*  K 3.8 3.5  CL 96* 102  CO2 22 22  GLUCOSE 431* 437*  BUN 120* 98*  CREATININE 4.61* 4.20*  CALCIUM 8.7* 8.0*    Liver Function Tests:  Recent Labs Lab 01/12/15 0121  AST 30  ALT 11*  ALKPHOS 150*  BILITOT 0.4  PROT 5.4*  ALBUMIN 1.8*   No results for input(s): LIPASE, AMYLASE in the last 168 hours. No results for input(s): AMMONIA in the last 168 hours.  CBC:  Recent Labs Lab 01/11/15 2011 01/12/15 0121  WBC 13.3* 10.0  NEUTROABS 10.8*  --   HGB 7.7* 7.4*  HCT 23.0* 21.8*  MCV 92.1 92.3  PLT 192 174    Cardiac Enzymes:  Recent Labs Lab 01/11/15 2011  TROPONINI 0.03    BNP: Invalid input(s): POCBNP  CBG:  Recent Labs Lab 01/11/15 2035 01/12/15 0053 01/12/15 0849 01/12/15 1059 01/12/15 1115  GLUCAP 422* 431* 351* 420* 391*  Microbiology: Results for orders placed or performed during the hospital encounter of 01/11/15  Urine culture     Status: None (Preliminary result)   Collection Time: 01/11/15  8:47 PM  Result Value Ref Range Status   Specimen Description URINE, CLEAN CATCH  Final   Special Requests NONE  Final   Culture HOLDING FOR POSSIBLE PATHOGEN  Final   Report Status PENDING  Incomplete  Culture, blood (routine x 2)     Status: None (Preliminary result)   Collection Time: 01/11/15 10:41 PM  Result Value Ref Range Status   Specimen Description BLOOD LEFT ASSIST CONTROL  Final   Special Requests BOTTLES DRAWN AEROBIC AND ANAEROBIC 4CC  Final   Culture NO GROWTH < 12 HOURS  Final   Report Status PENDING  Incomplete  Culture, blood (routine x 2)     Status: None (Preliminary result)   Collection Time: 01/11/15 10:41 PM  Result  Value Ref Range Status   Specimen Description BLOOD RIGHT ASSIST CONTROL  Final   Special Requests BOTTLES DRAWN AEROBIC AND ANAEROBIC 4CC  Final   Culture NO GROWTH < 12 HOURS  Final   Report Status PENDING  Incomplete  MRSA PCR Screening     Status: Abnormal   Collection Time: 01/12/15  7:11 AM  Result Value Ref Range Status   MRSA by PCR POSITIVE (A) NEGATIVE Final    Comment:        The GeneXpert MRSA Assay (FDA approved for NASAL specimens only), is one component of a comprehensive MRSA colonization surveillance program. It is not intended to diagnose MRSA infection nor to guide or monitor treatment for MRSA infections. CRITICAL RESULT CALLED TO, READ BACK BY AND VERIFIED WITH: [name] HODGE AT 0946 ON 01/12/15 BY QSD     Coagulation Studies: No results for input(s): LABPROT, INR in the last 72 hours.  Urinalysis:  Recent Labs  01/11/15 2047  COLORURINE BROWN*  LABSPEC 1.015  PHURINE 0.0*  GLUCOSEU TEST NOT REPORTED DUE TO COLOR INTERFERENCE OF URINE PIGMENT*  HGBUR TEST NOT REPORTED DUE TO COLOR INTERFERENCE OF URINE PIGMENT*  BILIRUBINUR TEST NOT REPORTED DUE TO COLOR INTERFERENCE OF URINE PIGMENT*  KETONESUR TEST NOT REPORTED DUE TO COLOR INTERFERENCE OF URINE PIGMENT*  PROTEINUR TEST NOT REPORTED DUE TO COLOR INTERFERENCE OF URINE PIGMENT*  NITRITE TEST NOT REPORTED DUE TO COLOR INTERFERENCE OF URINE PIGMENT*  LEUKOCYTESUR TEST NOT REPORTED DUE TO COLOR INTERFERENCE OF URINE PIGMENT*      Imaging: Dg Chest Port 1 View  01/11/2015   CLINICAL DATA:  Patient with altered mental status.  EXAM: PORTABLE CHEST - 1 VIEW  COMPARISON:  Chest radiograph 08/10/2013  FINDINGS: Monitoring leads overlie the patient. Stable enlarged cardiac and mediastinal contours. Pulmonary vascular redistribution and bilateral predominately perihilar interstitial pulmonary opacities.  IMPRESSION: Cardiomegaly. Mild interstitial pulmonary opacities suggestive of mild edema.   Electronically  Signed   By: Lovey Newcomer M.D.   On: 01/11/2015 22:44      Assessment & Plan: Pt is a 80 y.o. female with a PMHx of diabetes mellitus type II, hyperlipidemia, atropic dermatitis, history of cholecystitis, depression, anxiety, hypertension, CKD stage III baseline egfr 89, who was admitted to [facility] on 01/11/2015 for evaluation of altered mental status.    1.  Acute renal failure/CKD stage III baseline egfr of 58.  Suspect pt became volume depleted due to UTI.   -IVF hydration reordered, in addition will check spep/upep/ana/renal US for further work up.  No urgent indication for  HD as Cr is decreasing.     2.  UTI:  Had TNTC WBCs in the urine, continue vancomycin and zosyn, once urine cultures back would narrow antibiotic spectrum.  3.  Anemia unspecified: hgb low at 7.4, consider blood transfusion for hgb of 7 or less, spep/upep ordered, will also check iron studies.

## 2015-01-12 NOTE — Progress Notes (Signed)
ANTIBIOTIC CONSULT NOTE - INITIAL  Pharmacy Consult for Zosyn/vancomycin Indication: UTI  Allergies  Allergen Reactions  . Hydrocodone-Acetaminophen Other (See Comments)    Reaction:  Dizziness   . Meloxicam Nausea And Vomiting  . Penicillins Rash    Patient Measurements: Height:  (160 cm) Weight: 123 lb (55.792 kg) IBW/kg (Calculated) : 52.4 Adjusted Body Weight: 55.8 kg  Vital Signs: Temp: 98.2 F (36.8 C) (08/20 0024) Temp Source: Oral (08/20 0024) BP: 120/50 mmHg (08/20 0024) Pulse Rate: 77 (08/20 0024) Intake/Output from previous day:   Intake/Output from this shift:    Labs:  Recent Labs  01/11/15 2011  WBC 13.3*  HGB 7.7*  PLT 192  CREATININE 4.61*   Estimated Creatinine Clearance: 8.5 mL/min (by C-G formula based on Cr of 4.61). No results for input(s): VANCOTROUGH, VANCOPEAK, VANCORANDOM, GENTTROUGH, GENTPEAK, GENTRANDOM, TOBRATROUGH, TOBRAPEAK, TOBRARND, AMIKACINPEAK, AMIKACINTROU, AMIKACIN in the last 72 hours.   Microbiology: No results found for this or any previous visit (from the past 720 hour(s)).  Medical History: Past Medical History  Diagnosis Date  . Hypothyroidism 11/01/1995  . Anxiety 11/01/1995  . Diabetes mellitus type II 05/25/1996    insulin-requiring  . Hyperlipemia 06/25/2002  . Atopic dermatitis   . History of MRI of brain and brain stem 07/10/2005    w amd w/o no mass/infarct small vess dz  . Dehydration     Hosp [facility], hyperglycemia and ketoacidosis (stopped insulin)  . Pancytopenia 06/24-7/05/2007    Tissue D/O  . History of gallstones 11/21/2007    Abd U/S multip gallstones contracted GB CBD ULN Tr hydronephr R  . Acute cholecystitis 12/1/-05/20/08    [facility], s/p lap choleycystectomy CTD DM w/hypoglycemic sz in ER  . Gallstones 05/11/08    abd U/S gallstones pericholeycystic fluid [facility]  . Gastro-esophageal reflux disease without esophagitis   . Depression   . Anxiety disorder   . Dysphagia   . Hearing  impaired   . Hypertension   . Carpal tunnel syndrome   . Weakness   . Anemia   . Glaucoma   . Mood disorder   . Kidney disease     Medications:  Infusions:  . sodium chloride     Assessment: 79 yof nursing home resident being treated for UTI with oral cipro at home presents with AMS and abnormal kidney function (baseline SCr 1, today above 4). Vd 39 L, Ke 0.011 hr-1, T1/2 60 hours.  Goal of Therapy:  Vancomycin trough level 10-15 mcg/ml.   Plan:  Expected duration 5 days with resolution of temperature and/or normalization of WBC. Entered order for Zosyn 3.375 IV Q12H EI and BMP for AM. Have not entered repeat vancomycin dose yet, will wait for AM labs.   Carola Frost, Pharm.D. Clinical Pharmacist 01/12/2015,12:52 AM

## 2015-01-12 NOTE — Progress Notes (Signed)
ANTIBIOTIC CONSULT NOTE - INITIAL  Pharmacy Consult for Zosyn/vancomycin Indication: UTI  Allergies  Allergen Reactions  . Hydrocodone-Acetaminophen Other (See Comments)    Reaction:  Dizziness   . Meloxicam Nausea And Vomiting  . Penicillins Rash    Patient Measurements: Height:  (160 cm) Weight: 125 lb (56.7 kg) IBW/kg (Calculated) : 52.4 Adjusted Body Weight: 55.8 kg  Vital Signs: Temp: 98.3 F (36.8 C) (08/20 0053) Temp Source: Oral (08/20 0053) BP: 94/45 mmHg (08/20 0053) Pulse Rate: 73 (08/20 0053) Intake/Output from previous day:   Intake/Output from this shift: Total I/O In: 500 [I.V.:500] Out: -   Labs:  Recent Labs  01/11/15 2011 01/12/15 0121  WBC 13.3* 10.0  HGB 7.7* 7.4*  PLT 192 174  CREATININE 4.61* 4.20*   Estimated Creatinine Clearance: 9.3 mL/min (by C-G formula based on Cr of 4.2). No results for input(s): VANCOTROUGH, VANCOPEAK, VANCORANDOM, GENTTROUGH, GENTPEAK, GENTRANDOM, TOBRATROUGH, TOBRAPEAK, TOBRARND, AMIKACINPEAK, AMIKACINTROU, AMIKACIN in the last 72 hours.   Microbiology: Recent Results (from the past 720 hour(s))  Culture, blood (routine x 2)     Status: None (Preliminary result)   Collection Time: 01/11/15 10:41 PM  Result Value Ref Range Status   Specimen Description BLOOD LEFT ASSIST CONTROL  Final   Special Requests BOTTLES DRAWN AEROBIC AND ANAEROBIC 4CC  Final   Culture NO GROWTH < 12 HOURS  Final   Report Status PENDING  Incomplete  Culture, blood (routine x 2)     Status: None (Preliminary result)   Collection Time: 01/11/15 10:41 PM  Result Value Ref Range Status   Specimen Description BLOOD RIGHT ASSIST CONTROL  Final   Special Requests BOTTLES DRAWN AEROBIC AND ANAEROBIC 4CC  Final   Culture NO GROWTH < 12 HOURS  Final   Report Status PENDING  Incomplete    Medical History: Past Medical History  Diagnosis Date  . Hypothyroidism 11/01/1995  . Anxiety 11/01/1995  . Diabetes mellitus type II 05/25/1996    insulin-requiring  . Hyperlipemia 06/25/2002  . Atopic dermatitis   . History of MRI of brain and brain stem 07/10/2005    w amd w/o no mass/infarct small vess dz  . Dehydration     Hosp [facility], hyperglycemia and ketoacidosis (stopped insulin)  . Pancytopenia 06/24-7/05/2007    Tissue D/O  . History of gallstones 11/21/2007    Abd U/S multip gallstones contracted GB CBD ULN Tr hydronephr R  . Acute cholecystitis 12/1/-05/20/08    [facility], s/p lap choleycystectomy CTD DM w/hypoglycemic sz in ER  . Gallstones 05/11/08    abd U/S gallstones pericholeycystic fluid [facility]  . Gastro-esophageal reflux disease without esophagitis   . Depression   . Anxiety disorder   . Dysphagia   . Hearing impaired   . Hypertension   . Carpal tunnel syndrome   . Weakness   . Anemia   . Glaucoma   . Mood disorder   . Kidney disease     Medications:  Infusions:  . sodium chloride 75 mL/hr at 01/12/15 0120   Assessment: 79 yof nursing home resident being treated for UTI with oral cipro at home presents with AMS and abnormal kidney function (baseline SCr 1, today above 4). Patient started on Zosyn EI 3.375g IV Q12hr and received vancomycin 1g IV x 1 in ED on 8/19.    Goal of Therapy:  Vancomycin trough level 10-15 mcg/ml.   Plan:  Will continue patient on Zosyn EI 3.375g IV Q12hr.    Will continue patient  on vancomycin 500mg  IV Q48hr for goal trough of 10-15. Will schedule next dose for 0800 on 8/21. Will obtain trough as clinically indicated.    Pharmacy will continue to monitor and adjust per consult.    Simpson,Michael L, Pharm.D. Clinical Pharmacist 01/12/2015,9:26 AM

## 2015-01-12 NOTE — Evaluation (Signed)
Clinical/Bedside Swallow Evaluation Patient Details  Name: Gina Andersen MRN: [id number] Date of Birth: [birth year]  Today's Date: 01/12/2015 Time: SLP Start Time (ACUTE ONLY): 0945 SLP Stop Time (ACUTE ONLY): 1011 SLP Time Calculation (min) (ACUTE ONLY): 26 min  Past Medical History:  Past Medical History  Diagnosis Date  . Hypothyroidism 11/01/1995  . Anxiety 11/01/1995  . Diabetes mellitus type II 05/25/1996    insulin-requiring  . Hyperlipemia 06/25/2002  . Atopic dermatitis   . History of MRI of brain and brain stem 07/10/2005    w amd w/o no mass/infarct small vess dz  . Dehydration     Hosp [facility], hyperglycemia and ketoacidosis (stopped insulin)  . Pancytopenia 06/24-7/05/2007    Tissue D/O  . History of gallstones 11/21/2007    Abd U/S multip gallstones contracted GB CBD ULN Tr hydronephr R  . Acute cholecystitis 12/1/-05/20/08    [facility], s/p lap choleycystectomy CTD DM w/hypoglycemic sz in ER  . Gallstones 05/11/08    abd U/S gallstones pericholeycystic fluid [facility]  . Gastro-esophageal reflux disease without esophagitis   . Depression   . Anxiety disorder   . Dysphagia   . Hearing impaired   . Hypertension   . Carpal tunnel syndrome   . Weakness   . Anemia   . Glaucoma   . Mood disorder   . Kidney disease    Past Surgical History:  Past Surgical History  Procedure Laterality Date  . Partial hysterectomy      OV intact  . Carpel tunn      right  . Trigger finger release      left  . Ganglionectomy    . Cholecystectomy    . Abdominal hysterectomy     HPI:      Assessment / Plan / Recommendation Clinical Impression  pt presents with a mild to moderate oral pharyneal dysphagia charactorized by pt coughing and throat clear with solid trials and thin via straw. pt with oral holding and pocketing of solids with pt unaware of pocketed foods. pt with no overt ssx aspiration with thin via cup or puree trials. st educated nursing per recommendations.     Aspiration Risk  Mild    Diet Recommendation Dysphagia 1 (Puree);Thin   Medication Administration: Crushed with puree Compensations: Slow rate;Small sips/bites;Check for pocketing    Other  Recommendations Oral Care Recommendations: Oral care BID   Follow Up Recommendations       Frequency and Duration min 5x/week  1 week   Pertinent Vitals/Pain No pain reported    SLP Swallow Goals     Swallow Study Prior Functional Status       General Date of Onset: 01/12/15 Type of Study: Bedside swallow evaluation Diet Prior to this Study: Regular;Thin liquids Temperature Spikes Noted: N/A Respiratory Status: Room air History of Recent Intubation: No Behavior/Cognition: Alert;Cooperative;Pleasant mood Oral Cavity - Dentition: Edentulous Self-Feeding Abilities: Needs assist Patient Positioning: Upright in bed Baseline Vocal Quality: Normal    Oral/Motor/Sensory Function Overall Oral Motor/Sensory Function: Impaired Labial Strength: Reduced Lingual Strength: Reduced   Ice Chips Ice chips: Within functional limits   Thin Liquid Thin Liquid: Within functional limits Presentation: Cup    Nectar Thick Nectar Thick Liquid: Not tested   Honey Thick Honey Thick Liquid: Not tested   Puree Puree: Within functional limits   Solid   GO    Solid: Impaired Oral Phase Impairments: Impaired mastication;Impaired anterior to posterior transit Oral Phase Functional Implications: Oral residue;Oral holding Pharyngeal Phase Impairments:  Wet Vocal Quality;Multiple swallows;Cough - Immediate;Throat Clearing - Immediate       Meredith Pel Sauber 01/12/2015,12:04 PM

## 2015-01-12 NOTE — Progress Notes (Signed)
Patient ID: Gina Andersen, female   DOB: 07/29/1934, 80 y.o.   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 She states she came into the hospital because she was weak. She feels okay otherwise. No nausea vomiting or diarrhea.  Objective: Filed Vitals:   01/12/15 0053  BP: 94/45  Pulse: 73  Temp: 98.3 F (36.8 C)  Resp: 20    Filed Weights   01/11/15 2020 01/12/15 0500  Weight: 55.792 kg (123 lb) 56.7 kg (125 lb)    ROS: Review of Systems  Constitutional: Negative for fever and chills.  Eyes: Negative for blurred vision.  Respiratory: Negative for cough and shortness of breath.   Cardiovascular: Negative for chest pain.  Gastrointestinal: Negative for nausea, vomiting, abdominal pain, diarrhea and constipation.  Genitourinary: Negative for dysuria.  Musculoskeletal: Negative for joint pain.  Neurological: Negative for dizziness and headaches.   Exam: Physical Exam  Constitutional: She is oriented to person, place, and time.  HENT:  Nose: No mucosal edema.  Mouth/Throat: No oropharyngeal exudate or posterior oropharyngeal edema.  Eyes: Conjunctivae, EOM and lids are normal. Pupils are equal, round, and reactive to light.  Neck: No JVD present. Carotid bruit is not present. No edema present. No thyroid mass and no thyromegaly present.  Cardiovascular: S1 normal and S2 normal.  Exam reveals no gallop.   No murmur heard. Pulses:      Dorsalis pedis pulses are 2+ on the right side, and 2+ on the left side.  Respiratory: No respiratory distress. She has no wheezes. She has no rhonchi. She has no rales.  GI: Soft. Bowel sounds are normal. There is tenderness in the suprapubic area.  Musculoskeletal:       Right ankle: She exhibits no swelling.       Left ankle: She exhibits no swelling.  Lymphadenopathy:    She has no cervical adenopathy.  Neurological: She is alert and  oriented to person, place, and time.  Hearing impairment  Skin: Skin is warm. No rash noted. Nails show no clubbing.  Psychiatric: She has a normal mood and affect.    Data Reviewed: Basic Metabolic Panel:  Recent Labs Lab 01/11/15 2011 01/12/15 0121  NA 128* 129*  K 3.8 3.5  CL 96* 102  CO2 22 22  GLUCOSE 431* 437*  BUN 120* 98*  CREATININE 4.61* 4.20*  CALCIUM 8.7* 8.0*   Liver Function Tests:  Recent Labs Lab 01/12/15 0121  AST 30  ALT 11*  ALKPHOS 150*  BILITOT 0.4  PROT 5.4*  ALBUMIN 1.8*   CBC:  Recent Labs Lab 01/11/15 2011 01/12/15 0121  WBC 13.3* 10.0  NEUTROABS 10.8*  --   HGB 7.7* 7.4*  HCT 23.0* 21.8*  MCV 92.1 92.3  PLT 192 174   Cardiac Enzymes:  Recent Labs Lab 01/11/15 2011  TROPONINI 0.03    CBG:  Recent Labs Lab 01/11/15 2035 01/12/15 0053 01/12/15 0849 01/12/15 1059 01/12/15 1115  GLUCAP 422* 431* 351* 420* 391*    Recent Results (from the past 240 hour(s))  Urine culture     Status: None (Preliminary result)   Collection Time: 01/11/15  8:47 PM  Result Value Ref Range Status   Specimen Description URINE, CLEAN CATCH  Final   Special Requests NONE  Final   Culture HOLDING FOR POSSIBLE PATHOGEN  Final   Report Status PENDING  Incomplete  Culture, blood (routine x 2)  Status: None (Preliminary result)   Collection Time: 01/11/15 10:41 PM  Result Value Ref Range Status   Specimen Description BLOOD LEFT ASSIST CONTROL  Final   Special Requests BOTTLES DRAWN AEROBIC AND ANAEROBIC 4CC  Final   Culture NO GROWTH < 12 HOURS  Final   Report Status PENDING  Incomplete  Culture, blood (routine x 2)     Status: None (Preliminary result)   Collection Time: 01/11/15 10:41 PM  Result Value Ref Range Status   Specimen Description BLOOD RIGHT ASSIST CONTROL  Final   Special Requests BOTTLES DRAWN AEROBIC AND ANAEROBIC 4CC  Final   Culture   Final    GRAM NEGATIVE RODS IN BOTH AEROBIC AND ANAEROBIC BOTTLES CRITICAL RESULT  CALLED TO, READ BACK BY AND VERIFIED WITH: CHRIS BENNETT,RN 01/12/2015 AT 1356 BY JRS.    Report Status PENDING  Incomplete  MRSA PCR Screening     Status: Abnormal   Collection Time: 01/12/15  7:11 AM  Result Value Ref Range Status   MRSA by PCR POSITIVE (A) NEGATIVE Final    Comment:        The GeneXpert MRSA Assay (FDA approved for NASAL specimens only), is one component of a comprehensive MRSA colonization surveillance program. It is not intended to diagnose MRSA infection nor to guide or monitor treatment for MRSA infections. CRITICAL RESULT CALLED TO, READ BACK BY AND VERIFIED WITH: [name] HODGE AT 4098 ON 01/12/15 BY QSD      Studies: Dg Chest Port 1 View  01/11/2015   CLINICAL DATA:  Patient with altered mental status.  EXAM: PORTABLE CHEST - 1 VIEW  COMPARISON:  Chest radiograph 08/10/2013  FINDINGS: Monitoring leads overlie the patient. Stable enlarged cardiac and mediastinal contours. Pulmonary vascular redistribution and bilateral predominately perihilar interstitial pulmonary opacities.  IMPRESSION: Cardiomegaly. Mild interstitial pulmonary opacities suggestive of mild edema.   Electronically Signed   By: Annia Belt M.D.   On: 01/11/2015 22:44    Scheduled Meds: . aspirin EC  81 mg Oral Daily  . heparin  5,000 Units Subcutaneous 3 times per day  . insulin aspart  0-9 Units Subcutaneous TID WC  . insulin glargine  10 Units Subcutaneous QHS  . latanoprost  1 drop Both Eyes QHS  . levothyroxine  50 mcg Oral QAC breakfast  . pantoprazole  40 mg Oral QAC breakfast  . piperacillin-tazobactam (ZOSYN)  IV  3.375 g Intravenous Q12H  . [START ON 01/13/2015] pneumococcal 23 valent vaccine  0.5 mL Intramuscular Tomorrow-1000  . pregabalin  25 mg Oral BID  . sodium chloride  3 mL Intravenous Q12H    Assessment/Plan:  1. Severe sepsis, acute cystitis without hematuria. Gram-negative rods growing in the blood and urine. I will DC vancomycin and continue the Zosyn for right now.  Once urine culture is back I will adjust antibiotics. 2. Acute renal failure- continue IV fluid hydration. Likely ATN. Patient having some lower abdominal discomfort with palpation. Asked the nurse to bladder scan. 3. Hyponatremia- continue to hydrate at this point. Hopefully this will start coming up she is eating better. 4. Diabetes type 2 uncontrolled- start low dose Lantus. 5. Hypothyroidism unspecified continue levothyroxine and check a TSH in the morning. 6. Gastroesophageal reflux disease without esophagitis continue proton pump inhibitor 7. Glaucoma unspecified continue latanoprost. 8. Hypokalemia replace potassium orally 9. Anemia unspecified. I will send off iron studies in the morning and guaiac stools.  Code Status:     Code Status Orders  Start     Ordered   01/12/15 0046  Full code   Continuous     01/12/15 0045    Advance Directive Documentation        Most Recent Value   Type of Advance Directive  Healthcare Power of Attorney   Pre-existing out of facility DNR order (yellow form or pink MOST form)     "MOST" Form in Place?       Disposition Plan: 1 sepsis better back to [facility]  Consultants:  Nephrology.  Time spent: 25 minutes  Alford Highland  [facility]

## 2015-01-12 NOTE — Progress Notes (Signed)
Pt has high blood sugar in the 430s, MD notified. Dr. Anne Hahn to put in orders.

## 2015-01-12 NOTE — Care Management Note (Signed)
Case Management Note  Patient Details  Name: Gina Andersen MRN: [id number] Date of Birth: [birth year]  Subjective/Objective:           Received a CM Consult requesting LTAC. This Clinical research associate briefly reviewed the case with Corrina at [facility] and requested that she please assess for appropriateness for LTAC. Must be inpatient for at least 3 days before LTAC can admit and Corrina will see [name] on Monday 01/14/15. [name] is from [facility] where she has reportedly been refusing meds including insulin. [name] is deaf but reads lips well.          Action/Plan:   Expected Discharge Date:  01/15/15               Expected Discharge Plan:     In-House Referral:     Discharge planning Services     Post Acute Care Choice:    Choice offered to:     DME Arranged:    DME Agency:     HH Arranged:    HH Agency:     Status of Service:     Medicare Important Message Given:    Date Medicare IM Given:    Medicare IM give by:    Date Additional Medicare IM Given:    Additional Medicare Important Message give by:     If discussed at Long Length of Stay Meetings, dates discussed:    Additional Comments:  Reade Trefz A, RN 01/12/2015, 3:24 PM

## 2015-01-12 NOTE — Plan of Care (Signed)
Problem: Discharge Progression Outcomes Goal: Discharge plan in place and appropriate Outcome: Progressing Patient is from [facility] Hx of DMII, HTN, hypothyroidism, anxiety, chronic anemia, depression, continue home medications High Fall Risk, Full code Patient is deaf, able to read lips. Goal: Other Discharge Outcomes/Goals Outcome: Progressing Pt is alert, confused. Incontinent of urine and stool. C/o stomach pain, tylenol given. Xanax given for anxiety with relief. NS going at 75 ml/hr. 10 units of Novolog given for blood sugar of 431.

## 2015-01-12 NOTE — Progress Notes (Signed)
Tc message attempted to return to : "Gina Andersen at [phone]" with "not in service" message given x 3 attempts.

## 2015-01-12 NOTE — Plan of Care (Signed)
Problem: Discharge Progression Outcomes Goal: Other Discharge Outcomes/Goals Outcome: Progressing 1. Plans to return to [facility] with case mgt referral in place. 2. Abdomen tender to touch with palpation. Rests quietly when not being care for; no pain meds this shift thus far. 3. Creatinine decreased to 4.20, Hgb 7.4 with no signs active bleeding except tiny blood clots in urine, hemoccult stool- negative. VSS.  4. Elevated FSBS's being controlled with insulin therapy with MD aware it is running in the 300-400's. Zosyn IV therapy continued. Bladder scan negative for urinary retention. + Blood culture result called to Dr. Hilton Sinclair. Nephrology consult in place. Renal US being done.  5. Diet modified to pureed, thin liquids with no straw with pt noted to pocket food. Edentulous. Requires feeding assistance; will hold cup to drink if handed to her. Tolerated diet well with good appetite. IVF's continued. 6. Turned/repositioned in bed.  Has excoriated red, tender labia and perineal area with meticulous pericare performed with barrier applied. Continued urinary incontinence. Pink Foam dgs in place over coccyx.

## 2015-01-12 NOTE — Progress Notes (Signed)
Pt back from renal US. In and Out Cath performed with urine cloudy lt brown with large amount of sediment and tiny blood clots with noted discharge between labia with leakage and sediment there. Labia edematous, red, tender to touch. 2+ RN's to do I&0 with maximum comfort efforts performed with obtained with specimen sent to lab. TC to Dr. Cherylann Ratel to verify other urine protein order with Dr. Cherylann Ratel advising only need spot protein with lab obtained-lab informed that 24 hour urine protein not needed; only need spot urine protein.

## 2015-01-13 DIAGNOSIS — N1330 Unspecified hydronephrosis: Secondary | ICD-10-CM

## 2015-01-13 DIAGNOSIS — N289 Disorder of kidney and ureter, unspecified: Secondary | ICD-10-CM

## 2015-01-13 DIAGNOSIS — A4151 Sepsis due to Escherichia coli [E. coli]: Secondary | ICD-10-CM | POA: Diagnosis not present

## 2015-01-13 DIAGNOSIS — R339 Retention of urine, unspecified: Secondary | ICD-10-CM | POA: Clinically undetermined

## 2015-01-13 DIAGNOSIS — A419 Sepsis, unspecified organism: Secondary | ICD-10-CM

## 2015-01-13 LAB — CBC
HCT: 20.2 % — ABNORMAL LOW (ref 35.0–47.0)
HEMOGLOBIN: 6.8 g/dL — AB (ref 12.0–16.0)
MCH: 31 pg (ref 26.0–34.0)
MCHC: 33.7 g/dL (ref 32.0–36.0)
MCV: 91.8 fL (ref 80.0–100.0)
Platelets: 174 10*3/uL (ref 150–440)
RBC: 2.2 MIL/uL — AB (ref 3.80–5.20)
RDW: 13.6 % (ref 11.5–14.5)
WBC: 8.7 10*3/uL (ref 3.6–11.0)

## 2015-01-13 LAB — GLUCOSE, CAPILLARY
GLUCOSE-CAPILLARY: 303 mg/dL — AB (ref 65–99)
GLUCOSE-CAPILLARY: 438 mg/dL — AB (ref 65–99)
Glucose-Capillary: 334 mg/dL — ABNORMAL HIGH (ref 65–99)
Glucose-Capillary: 379 mg/dL — ABNORMAL HIGH (ref 65–99)
Glucose-Capillary: 453 mg/dL — ABNORMAL HIGH (ref 65–99)

## 2015-01-13 LAB — IRON AND TIBC
IRON: 20 ug/dL — AB (ref 28–170)
SATURATION RATIOS: 13 % (ref 10.4–31.8)
TIBC: 159 ug/dL — ABNORMAL LOW (ref 250–450)
UIBC: 139 ug/dL

## 2015-01-13 LAB — BASIC METABOLIC PANEL
ANION GAP: 7 (ref 5–15)
BUN: 99 mg/dL — ABNORMAL HIGH (ref 6–20)
CALCIUM: 7.9 mg/dL — AB (ref 8.9–10.3)
CO2: 19 mmol/L — AB (ref 22–32)
Chloride: 106 mmol/L (ref 101–111)
Creatinine, Ser: 4.23 mg/dL — ABNORMAL HIGH (ref 0.44–1.00)
GFR calc non Af Amer: 9 mL/min — ABNORMAL LOW (ref >60)
GFR, EST AFRICAN AMERICAN: 11 mL/min — AB (ref >60)
Glucose, Bld: 332 mg/dL — ABNORMAL HIGH (ref 65–99)
Potassium: 3.9 mmol/L (ref 3.5–5.1)
Sodium: 132 mmol/L — ABNORMAL LOW (ref 135–145)

## 2015-01-13 LAB — PREPARE RBC (CROSSMATCH)

## 2015-01-13 LAB — FERRITIN: FERRITIN: 202 ng/mL (ref 11–307)

## 2015-01-13 MED ORDER — FUROSEMIDE 10 MG/ML IJ SOLN
20.0000 mg | Freq: Once | INTRAMUSCULAR | Status: AC
  Administered 2015-01-13: 14:00:00 20 mg via INTRAVENOUS
  Filled 2015-01-13: qty 2

## 2015-01-13 MED ORDER — SODIUM CHLORIDE 0.9 % IV SOLN
Freq: Once | INTRAVENOUS | Status: AC
  Administered 2015-01-13: 14:00:00 via INTRAVENOUS

## 2015-01-13 MED ORDER — INSULIN ASPART 100 UNIT/ML ~~LOC~~ SOLN: 0.0000 [IU] | Freq: Three times a day (TID) | SUBCUTANEOUS | Status: DC

## 2015-01-13 MED ORDER — CHLORHEXIDINE GLUCONATE CLOTH 2 % EX PADS
6.0000 | MEDICATED_PAD | Freq: Every day | CUTANEOUS | Status: AC
Start: 2015-01-13 — End: 2015-01-17
  Administered 2015-01-13 – 2015-01-17 (×5): 6 via TOPICAL

## 2015-01-13 MED ORDER — MUPIROCIN 2 % EX OINT
1.0000 | TOPICAL_OINTMENT | Freq: Two times a day (BID) | CUTANEOUS | Status: AC
Start: 2015-01-13 — End: 2015-01-18
  Administered 2015-01-13 – 2015-01-17 (×7): 1 via NASAL
  Filled 2015-01-13 (×2): qty 22

## 2015-01-13 MED ORDER — INSULIN ASPART 100 UNIT/ML ~~LOC~~ SOLN
0.0000 [IU] | Freq: Three times a day (TID) | SUBCUTANEOUS | Status: DC
  Administered 2015-01-13: 23:00:00 9 [IU] via SUBCUTANEOUS
  Filled 2015-01-13: qty 9

## 2015-01-13 MED ORDER — DIPHENHYDRAMINE HCL 25 MG PO CAPS
25.0000 mg | ORAL_CAPSULE | Freq: Once | ORAL | Status: AC
  Administered 2015-01-13: 14:00:00 25 mg via ORAL
  Filled 2015-01-13: qty 1

## 2015-01-13 MED ORDER — FUROSEMIDE 10 MG/ML IJ SOLN
20.0000 mg | Freq: Once | INTRAMUSCULAR | Status: AC
  Administered 2015-01-13: 20 mg via INTRAVENOUS

## 2015-01-13 MED ORDER — ACETAMINOPHEN 325 MG PO TABS
650.0000 mg | ORAL_TABLET | Freq: Once | ORAL | Status: AC
  Administered 2015-01-13: 650 mg via ORAL
  Filled 2015-01-13: qty 2

## 2015-01-13 NOTE — Clinical Social Work Note (Signed)
Clinical Social Work Assessment  Patient Details  Name: Gina Andersen MRN: [id number] Date of Birth: Jan 10, 1935  Date of referral:  01/13/15               Reason for consult:   (from Skilled facility )                Permission sought to share information with:  Facility Medical sales representative, Family Supports Permission granted to share information::  Yes, Verbal Permission Granted  Name::      (daughter Thornton Papas, daughter [name], son Gery Pray)  Agency::   ([facility])  Relationship::     Contact Information:     Housing/Transportation Living arrangements for the past 2 months:  Skilled Building surveyor of Information:  Adult Children, Facility Patient Interpreter Needed:  None Criminal Activity/Legal Involvement Pertinent to Current Situation/Hospitalization:    Significant Relationships:  Adult Children Lives with:  Facility Resident Do you feel safe going back to the place where you live?   (family does not want patient to return to [facility]) Need for family participation in patient care:     Care giving concerns:  Family is concerns with patient returning to [facility]   Social Worker assessment / plan:  Patient is a 80 year old deaf female that currently resides at [facility] for the past 8 months.  Patient reads lips well. CSW in to assess. Patient was lethargic and alert only to self while CSW in room.  Patient opened eyes, asked CSW "what is your name".  CSW introduced self.  Patient smiled and stated "that's a pretty name".  She closed eyes.    CSW was able to meet with patient's three adult children.  Patient's daughter Raynelle Fanning [phone] is her guardian, other contact if unable to reach Raynelle Fanning is her daughter [name] [phone] and son Gery Pray [phone]. Patient lives at [facility] for long term care for the past 8 months. Family does not want patient to return to [facility], stated they have spoken to [facility] and would like to go to [facility] when she is ready to  discharge.  CSW will complete FL2 and place on chart.  Will contact [facility] once patient is medically ready to discharge   Employment status:    Insurance information:  Medicaid [place], [place] PT Recommendations:  Not assessed at this time Information / Referral to community resources:  Skilled Nursing Facility  Patient/Family's Response to care:  Family appreciative of care patient is receiving at [facility].  They are prayerful that patient will be ok.   Patient/Family's Understanding of and Emotional Response to Diagnosis, Current Treatment, and Prognosis:  Family understand patient is very ill, MD talked with family.    Emotional Assessment Appearance:  Appears stated age Attitude/Demeanor/Rapport:    Affect (typically observed):  Unable to Assess Orientation:  Oriented to Self Alcohol / Substance use:    Psych involvement (Current and /or in the community):     Discharge Needs  Concerns to be addressed:    Readmission within the last 30 days:  No Current discharge risk:  Physical Impairment, Chronically ill, Cognitively Impaired, Dependent with Mobility Barriers to Discharge:  No Barriers Identified   Soundra Pilon, LCSW 01/13/2015, 2:41 PM Sammuel Hines. Theresia Majors, MSW Clinical Social Work Department Emergency Room [phone] 2:47 PM

## 2015-01-13 NOTE — Progress Notes (Signed)
Patient ID: Gina Andersen, female   DOB: April 05, 1935, 80 y.o.   MRN: [id number] I was able to reach family. They will come in and sign the blood consent. They agreed to give a blood transfusion for the low hemoglobin. Benefits and risks explained.  Spoke with nurse about blood transfusion later today after the family signs consent.  Dr. Alford Highland M.D.

## 2015-01-13 NOTE — Plan of Care (Signed)
Problem: Discharge Progression Outcomes Goal: Other Discharge Outcomes/Goals Outcome: Progressing Patient remains confused, continues to c/o of stomach pain. Tylenol given with some relief. Remains incontinent of urine and stool. NS at 75 ml/hr. Daughter Raynelle Fanning called, states she does not want patient to return to [facility], wants patient to go to [facility].

## 2015-01-13 NOTE — Progress Notes (Signed)
1823 1 unit PRBC's transfused with pt tolerating well. RN did stay with pt for first 15 minutes of blood transfusion.Family in to visit. Resting quietly.

## 2015-01-13 NOTE — Plan of Care (Signed)
Problem: Discharge Progression Outcomes Goal: Other Discharge Outcomes/Goals Outcome: Progressing 1. Family requesting pt not be transferred back to [facility] but requesting [facility].Doree Fudge and other family member in and met with SW. 2. Tylenol/benadryl given as premeds with pt resting quietly. Discomfort with pericare due to severe excoriation which has improved slightly since yesterday of labia/vaginal area with barrier cream applied. 3. Creatine more elevated at 4.23, Hgb down to 6.8, WBC normal. VSS. Color pale, generalized more weak. Abdomen remains distended. 4. Zosyn IV continued. St 18/10 foley inserted with thick, soupy beige urine with blood clots and sediment with some red blood returned. 1 unit PRBC's transfusing with pt tolerating well. Lasix IV given prior to blood start per MD order. 5. Tolerated diet well/drinking without difficulty from cup. 6. Continues at bedrest with turns/repositioning frequently with pericare done. Oral care performed. HOB elevated for meals/feeding. Meds given in applesauce.

## 2015-01-13 NOTE — Consult Note (Signed)
Subjective: I was asked to see Gina Andersen consultation for bilateral hydronephrosis with ARI and urosepsis.  She is a 80 y.o. female, nursing home resident, with a known history of diabetes mellitus type 2, hypertension, hyperlipidemia, hypothyroidism, anxiety disorder, chronic anemia who was admitted on 8/19 with altered mental status and renal insufficiency.  She had been on Cipro prior to admission and is now on vanc and zosyn.  She remains lethargic and I was unable to obtain additional history from the patient.  ROS:  Review of Systems  Unable to perform ROS: mental acuity    Allergies  Allergen Reactions  . Hydrocodone-Acetaminophen Other (See Comments)    Reaction:  Dizziness   . Meloxicam Nausea And Vomiting  . Penicillins Rash    Past Medical History  Diagnosis Date  . Hypothyroidism 11/01/1995  . Anxiety 11/01/1995  . Diabetes mellitus type II 05/25/1996    insulin-requiring  . Hyperlipemia 06/25/2002  . Atopic dermatitis   . History of MRI of brain and brain stem 07/10/2005    w amd w/o no mass/infarct small vess dz  . Dehydration     Hosp [facility], hyperglycemia and ketoacidosis (stopped insulin)  . Pancytopenia 06/24-7/05/2007    Tissue D/O  . History of gallstones 11/21/2007    Abd U/S multip gallstones contracted GB CBD ULN Tr hydronephr R  . Acute cholecystitis 12/1/-05/20/08    [facility], s/p lap choleycystectomy CTD DM w/hypoglycemic sz in ER  . Gallstones 05/11/08    abd U/S gallstones pericholeycystic fluid [facility]  . Gastro-esophageal reflux disease without esophagitis   . Depression   . Anxiety disorder   . Dysphagia   . Hearing impaired   . Hypertension   . Carpal tunnel syndrome   . Weakness   . Anemia   . Glaucoma   . Mood disorder   . Kidney disease     Past Surgical History  Procedure Laterality Date  . Partial hysterectomy      OV intact  . Carpel tunn      right  . Trigger finger release      left  . Ganglionectomy    .  Cholecystectomy    . Abdominal hysterectomy      Social History   Social History  . Marital Status: Divorced    Spouse Name: N/A  . Number of Children: 4  . Years of Education: N/A   Occupational History  . Disability     Retired disability hand injury via Tree surgeon   Social History Main Topics  . Smoking status: Never Smoker   . Smokeless tobacco: Never Used  . Alcohol Use: No  . Drug Use: No  . Sexual Activity: Not on file   Other Topics Concern  . Not on file   Social History Narrative   Divorced 1979, 4  Children, 2 marriages reported ending in divorce.   The patient is currently living with her son who is her primary caretaker, son's name is Aurther Loft    Family History  Problem Relation Age of Onset  . Kidney disease Mother   . Diabetes Mother   . Hypertension Mother   . Diabetes Brother   . Stroke Brother   . Stroke Brother     CVA  . Diabetes Brother    I have reviewed her past, family and social history.   Anti-infectives: Anti-infectives    Start     Dose/Rate Route Frequency Ordered Stop   01/13/15 0800  vancomycin (VANCOCIN)  500 mg in sodium chloride 0.9 % 100 mL IVPB  Status:  Discontinued     500 mg 100 mL/hr over 60 Minutes Intravenous Every 48 hours 01/12/15 0925 01/12/15 1400   01/12/15 1200  piperacillin-tazobactam (ZOSYN) IVPB 3.375 g     3.375 g 12.5 mL/hr over 240 Minutes Intravenous Every 12 hours 01/12/15 0052     01/12/15 0024  piperacillin-tazobactam (ZOSYN) 3.375 (3-0.375) G injection    Comments:  FLORES, LUIS: cabinet override      01/12/15 0024 01/12/15 1229   01/11/15 2230  cefTRIAXone (ROCEPHIN) 1 g in dextrose 5 % 50 mL IVPB  Status:  Discontinued     1 g 100 mL/hr over 30 Minutes Intravenous  Once 01/11/15 2222 01/11/15 2223   01/11/15 2230  vancomycin (VANCOCIN) IVPB 1000 mg/200 mL premix     1,000 mg 200 mL/hr over 60 Minutes Intravenous  Once 01/11/15 2226 01/11/15 2341   01/11/15 2230  piperacillin-tazobactam (ZOSYN)  IVPB 3.375 g     3.375 g 100 mL/hr over 30 Minutes Intravenous  Once 01/11/15 2226 01/12/15 0054      Current Facility-Administered Medications  Medication Dose Route Frequency Provider Last Rate Last Dose  . 0.9 %  sodium chloride infusion   Intravenous Continuous Alford Highland, MD 75 mL/hr at 01/13/15 0434    . acetaminophen (TYLENOL) tablet 650 mg  650 mg Oral Q6H PRN Crissie Figures, MD   650 mg at 01/12/15 2258   Or  . acetaminophen (TYLENOL) suppository 650 mg  650 mg Rectal Q6H PRN Crissie Figures, MD      . Chlorhexidine Gluconate Cloth 2 % PADS 6 each  6 each Topical Q0600 Alford Highland, MD   6 each at 01/13/15 [phone]  . insulin aspart (novoLOG) injection 0-9 Units  0-9 Units Subcutaneous TID WC Crissie Figures, MD   7 Units at 01/13/15 0740  . insulin glargine (LANTUS) injection 10 Units  10 Units Subcutaneous QHS Alford Highland, MD   10 Units at 01/12/15 2258  . latanoprost (XALATAN) 0.005 % ophthalmic solution 1 drop  1 drop Both Eyes QHS Crissie Figures, MD   1 drop at 01/12/15 0100  . levothyroxine (SYNTHROID, LEVOTHROID) tablet 50 mcg  50 mcg Oral QAC breakfast Crissie Figures, MD   50 mcg at 01/13/15 0740  . LORazepam (ATIVAN) tablet 0.5 mg  0.5 mg Oral Q6H PRN Crissie Figures, MD   0.5 mg at 01/12/15 0142  . mupirocin ointment (BACTROBAN) 2 % 1 application  1 application Nasal BID Alford Highland, MD      . ondansetron [facility]) tablet 4 mg  4 mg Oral Q6H PRN Crissie Figures, MD       Or  . ondansetron [facility]) injection 4 mg  4 mg Intravenous Q6H PRN Crissie Figures, MD      . pantoprazole (PROTONIX) EC tablet 40 mg  40 mg Oral QAC breakfast Crissie Figures, MD   40 mg at 01/13/15 0740  . piperacillin-tazobactam (ZOSYN) IVPB 3.375 g  3.375 g Intravenous Q12H Crissie Figures, MD   3.375 g at 01/13/15 1010  . pregabalin (LYRICA) capsule 25 mg  25 mg Oral BID Crissie Figures, MD   25 mg at 01/13/15 0740  . sodium chloride 0.9 % injection 3 mL  3 mL Intravenous  Q12H Crissie Figures, MD   3 mL at 01/12/15 0920     Objective: Vital signs in last 24 hours: Temp:  [  97.5 F (36.4 C)-97.8 F (36.6 C)] 97.5 F (36.4 C) (08/21 0539) Pulse Rate:  [73-81] 75 (08/21 0539) Resp:  [18-20] 18 (08/21 0539) BP: (105-121)/(50-55) 121/52 mmHg (08/21 0539) SpO2:  [98 %-100 %] 99 % (08/21 0539) Weight:  [57.153 kg (126 lb)] 57.153 kg (126 lb) (08/21 0423)  Intake/Output from previous day: 08/20 0701 - 08/21 0700 In: 2508 [P.O.:360; I.V.:2059; IV Piggyback:89] Out: 275 [Urine:275] Intake/Output this shift:     Physical Exam  Constitutional: Vital signs are normal. She appears unhealthy. She appears cachectic.  Cardiovascular: Normal rate and regular rhythm.   Pulmonary/Chest: Effort normal and breath sounds normal.  Abdominal: Normal appearance. She exhibits mass (suprapubic to umbilicus consistent with a distended bladder. ). There is tenderness in the suprapubic area.  Neurological:  She is arousable but alert only to person  Skin: Skin is warm and dry. There is pallor.    Lab Results:   Recent Labs  01/12/15 0121 01/13/15 0425  WBC 10.0 8.7  HGB 7.4* 6.8*  HCT 21.8* 20.2*  PLT 174 174   BMET  Recent Labs  01/12/15 0121 01/13/15 0425  NA 129* 132*  K 3.5 3.9  CL 102 106  CO2 22 19*  GLUCOSE 437* 332*  BUN 98* 99*  CREATININE 4.20* 4.23*  CALCIUM 8.0* 7.9*   PT/INR No results for input(s): LABPROT, INR in the last 72 hours. ABG No results for input(s): PHART, HCO3 in the last 72 hours.  Invalid input(s): PCO2, PO2  Studies/Results: US Renal  01/12/2015   CLINICAL DATA:  Patient with acute renal failure.  EXAM: RENAL / URINARY TRACT ULTRASOUND COMPLETE  COMPARISON:  CT abdomen pelvis 01/27/2013  FINDINGS: Right Kidney:  Length: 12.0 cm. Mild hydronephrosis. There are a few echogenic shadowing foci within the right kidney which are nonspecific however may represent small stones.  Left Kidney:  Length: 9.2 cm. Mild  hydronephrosis. There are multiple echogenic foci which may represent small stones.  Bladder:  There is a mixed echogenicity region within the dependent aspect of the bladder which demonstrates some mobility measuring up to 7 cm, potentially representing debris.  IMPRESSION: Bilateral hydronephrosis.  Echogenic foci within the kidneys bilaterally raising the possibility of bilateral nephrolithiasis.  Large isoechoic area within the posterior aspect of the bladder measuring up to 7 cm may represent debris however bladder mass is not excluded. Recommend correlation with urinalysis and direct visualization.   Electronically Signed   By: Annia Belt M.D.   On: 01/12/2015 16:42   Dg Chest Port 1 View  01/11/2015   CLINICAL DATA:  Patient with altered mental status.  EXAM: PORTABLE CHEST - 1 VIEW  COMPARISON:  Chest radiograph 08/10/2013  FINDINGS: Monitoring leads overlie the patient. Stable enlarged cardiac and mediastinal contours. Pulmonary vascular redistribution and bilateral predominately perihilar interstitial pulmonary opacities.  IMPRESSION: Cardiomegaly. Mild interstitial pulmonary opacities suggestive of mild edema.   Electronically Signed   By: Annia Belt M.D.   On: 01/11/2015 22:44   I have discussed the case with Dr. Renae Gloss and reviewed the [place] films and report along with her labs and hospital notes.   Assessment: Urinary retention with incomplete bladder emptying Gina Andersen is admitted with urosepsis and ARI.   A renal US shows bilateral hydro but her bladder is very full with layered debris at the base and she has a painful suprapubic mass on exam.    She has urinary retention possibly secondary to diabetic cystopathy with obstructive uropathy.  She needs foley catheter drainage with follow up US to assess for resolution of the hydronephrosis.     CC: Dr. Alford Highland.     Alp Goldwater J 01/13/2015 [phone]

## 2015-01-13 NOTE — Assessment & Plan Note (Signed)
Mrs. Boesen is admitted with urosepsis and ARI.   A renal US shows bilateral hydro but her bladder is very full with layered debris at the base and she has a painful suprapubic mass on exam.    She has urinary retention possibly secondary to diabetic cystopathy with obstructive uropathy.    She needs foley catheter drainage with follow up US to assess for resolution of the hydronephrosis.

## 2015-01-13 NOTE — Progress Notes (Signed)
[facility]  ROUNDING NOTE   Subjective:  Pt seen at bedside. Cr about the same at 4.23. UOP was only 275cc over the past 24 hours. hgb also down to 6.8.   Still lethargic but arousable.   Objective:  Vital signs in last 24 hours:  Temp:  [97.5 F (36.4 C)-97.8 F (36.6 C)] 97.5 F (36.4 C) (08/21 0539) Pulse Rate:  [73-81] 75 (08/21 0539) Resp:  [18-20] 18 (08/21 0539) BP: (105-121)/(50-55) 121/52 mmHg (08/21 0539) SpO2:  [98 %-100 %] 99 % (08/21 0539) Weight:  [57.153 kg (126 lb)] 57.153 kg (126 lb) (08/21 0423)  Weight change: 1.361 kg (3 lb) Filed Weights   01/11/15 2020 01/12/15 0500 01/13/15 0423  Weight: 55.792 kg (123 lb) 56.7 kg (125 lb) 57.153 kg (126 lb)    Intake/Output: I/O last 3 completed shifts: In: 2508 [P.O.:360; I.V.:2059; IV Piggyback:89] Out: 275 [Urine:275]   Intake/Output this shift:     Physical Exam: General: NAD, resting in bed  Head: Normocephalic, atraumatic. OMs dry  Eyes: Anicteric  Neck: Supple, trachea midline  Lungs:  Clear to auscultation normal effort  Heart: Regular rate and rhythm S1S2  Abdomen:  Soft, nontender, BS present  Extremities:  trace peripheral edema.  Neurologic: Lethargic but arousable  Skin: No lesions       Basic Metabolic Panel:  Recent Labs Lab 01/11/15 2011 01/12/15 0121 01/13/15 0425  NA 128* 129* 132*  K 3.8 3.5 3.9  CL 96* 102 106  CO2 22 22 19*  GLUCOSE 431* 437* 332*  BUN 120* 98* 99*  CREATININE 4.61* 4.20* 4.23*  CALCIUM 8.7* 8.0* 7.9*    Liver Function Tests:  Recent Labs Lab 01/12/15 0121  AST 30  ALT 11*  ALKPHOS 150*  BILITOT 0.4  PROT 5.4*  ALBUMIN 1.8*   No results for input(s): LIPASE, AMYLASE in the last 168 hours. No results for input(s): AMMONIA in the last 168 hours.  CBC:  Recent Labs Lab 01/11/15 2011 01/12/15 0121 01/13/15 0425  WBC 13.3* 10.0 8.7  NEUTROABS 10.8*  --   --   HGB 7.7* 7.4* 6.8*  HCT 23.0* 21.8* 20.2*  MCV 92.1 92.3 91.8   PLT 192 174 174    Cardiac Enzymes:  Recent Labs Lab 01/11/15 2011  TROPONINI 0.03    BNP: Invalid input(s): POCBNP  CBG:  Recent Labs Lab 01/12/15 1115 01/12/15 1708 01/12/15 2207 01/13/15 0718 01/13/15 1126  GLUCAP 391* 326* 326* 303* 334*    Microbiology: Results for orders placed or performed during the hospital encounter of 01/11/15  Urine culture     Status: None (Preliminary result)   Collection Time: 01/11/15  8:47 PM  Result Value Ref Range Status   Specimen Description URINE, CLEAN CATCH  Final   Special Requests NONE  Final   Culture HOLDING FOR POSSIBLE PATHOGEN  Final   Report Status PENDING  Incomplete  Culture, blood (routine x 2)     Status: None (Preliminary result)   Collection Time: 01/11/15 10:41 PM  Result Value Ref Range Status   Specimen Description BLOOD LEFT ASSIST CONTROL  Final   Special Requests BOTTLES DRAWN AEROBIC AND ANAEROBIC 4CC  Final   Culture  Setup Time   Final    GRAM NEGATIVE RODS AEROBIC BOTTLE ONLY CRITICAL VALUE NOTED.  VALUE IS CONSISTENT WITH PREVIOUSLY REPORTED AND CALLED VALUE.    Culture   Final    GRAM NEGATIVE RODS AEROBIC BOTTLE ONLY IDENTIFICATION AND SUSCEPTIBILITIES TO FOLLOW  Report Status PENDING  Incomplete  Culture, blood (routine x 2)     Status: None (Preliminary result)   Collection Time: 01/11/15 10:41 PM  Result Value Ref Range Status   Specimen Description BLOOD RIGHT ASSIST CONTROL  Final   Special Requests BOTTLES DRAWN AEROBIC AND ANAEROBIC 4CC  Final   Culture  Setup Time   Final    GRAM NEGATIVE RODS IN BOTH AEROBIC AND ANAEROBIC BOTTLES CRITICAL RESULT CALLED TO, READ BACK BY AND VERIFIED WITH: CHRIS [place] 01/12/2015 AT 1356 BY JRS.    Culture   Final    GRAM NEGATIVE RODS IN BOTH AEROBIC AND ANAEROBIC BOTTLES IDENTIFICATION AND SUSCEPTIBILITIES TO FOLLOW    Report Status PENDING  Incomplete  MRSA PCR Screening     Status: Abnormal   Collection Time: 01/12/15  7:11 AM   Result Value Ref Range Status   MRSA by PCR POSITIVE (A) NEGATIVE Final    Comment:        The GeneXpert MRSA Assay (FDA approved for NASAL specimens only), is one component of a comprehensive MRSA colonization surveillance program. It is not intended to diagnose MRSA infection nor to guide or monitor treatment for MRSA infections. CRITICAL RESULT CALLED TO, READ BACK BY AND VERIFIED WITH: [name] HODGE AT 0946 ON 01/12/15 BY QSD     Coagulation Studies: No results for input(s): LABPROT, INR in the last 72 hours.  Urinalysis:  Recent Labs  01/11/15 2047  COLORURINE BROWN*  LABSPEC 1.015  PHURINE 0.0*  GLUCOSEU TEST NOT REPORTED DUE TO COLOR INTERFERENCE OF URINE PIGMENT*  HGBUR TEST NOT REPORTED DUE TO COLOR INTERFERENCE OF URINE PIGMENT*  BILIRUBINUR TEST NOT REPORTED DUE TO COLOR INTERFERENCE OF URINE PIGMENT*  KETONESUR TEST NOT REPORTED DUE TO COLOR INTERFERENCE OF URINE PIGMENT*  PROTEINUR TEST NOT REPORTED DUE TO COLOR INTERFERENCE OF URINE PIGMENT*  NITRITE TEST NOT REPORTED DUE TO COLOR INTERFERENCE OF URINE PIGMENT*  LEUKOCYTESUR TEST NOT REPORTED DUE TO COLOR INTERFERENCE OF URINE PIGMENT*      Imaging: US Renal  01/12/2015   CLINICAL DATA:  Patient with acute renal failure.  EXAM: RENAL / URINARY TRACT ULTRASOUND COMPLETE  COMPARISON:  CT abdomen pelvis 01/27/2013  FINDINGS: Right Kidney:  Length: 12.0 cm. Mild hydronephrosis. There are a few echogenic shadowing foci within the right kidney which are nonspecific however may represent small stones.  Left Kidney:  Length: 9.2 cm. Mild hydronephrosis. There are multiple echogenic foci which may represent small stones.  Bladder:  There is a mixed echogenicity region within the dependent aspect of the bladder which demonstrates some mobility measuring up to 7 cm, potentially representing debris.  IMPRESSION: Bilateral hydronephrosis.  Echogenic foci within the kidneys bilaterally raising the possibility of bilateral  nephrolithiasis.  Large isoechoic area within the posterior aspect of the bladder measuring up to 7 cm may represent debris however bladder mass is not excluded. Recommend correlation with urinalysis and direct visualization.   Electronically Signed   By: Lovey Newcomer M.D.   On: 01/12/2015 16:42   Dg Chest Port 1 View  01/11/2015   CLINICAL DATA:  Patient with altered mental status.  EXAM: PORTABLE CHEST - 1 VIEW  COMPARISON:  Chest radiograph 08/10/2013  FINDINGS: Monitoring leads overlie the patient. Stable enlarged cardiac and mediastinal contours. Pulmonary vascular redistribution and bilateral predominately perihilar interstitial pulmonary opacities.  IMPRESSION: Cardiomegaly. Mild interstitial pulmonary opacities suggestive of mild edema.   Electronically Signed   By: Lovey Newcomer M.D.   On: 01/11/2015  22:44     Medications:   . sodium chloride 75 mL/hr at 01/13/15 0434   . Chlorhexidine Gluconate Cloth  6 each Topical Q0600  . insulin aspart  0-9 Units Subcutaneous TID WC  . insulin glargine  10 Units Subcutaneous QHS  . latanoprost  1 drop Both Eyes QHS  . levothyroxine  50 mcg Oral QAC breakfast  . mupirocin ointment  1 application Nasal BID  . pantoprazole  40 mg Oral QAC breakfast  . piperacillin-tazobactam (ZOSYN)  IV  3.375 g Intravenous Q12H  . pregabalin  25 mg Oral BID  . sodium chloride  3 mL Intravenous Q12H   acetaminophen **OR** acetaminophen, LORazepam, ondansetron **OR** ondansetron (ZOFRAN) IV  Assessment/ Plan:  80 y.o. female with a PMHx of diabetes mellitus type II, hyperlipidemia, atropic dermatitis, history of cholecystitis, depression, anxiety, hypertension, CKD stage III baseline egfr 47, who was admitted to [facility] on 01/11/2015 for evaluation of altered mental status.   1. Acute renal failure/CKD stage III baseline egfr of 58. Suspect pt became volume depleted due to UTI.  -Renal function about the same today, urine outpt also in the oliguric range, no  urgent indication for HD at present, but may need to consider this tomorrow if oliguria persists.  Will add anca/gbms/complements to serologic w/u as well.  2. UTI: Had TNTC WBCs in the urine, gram negative rods growing in the blood, continue zosyn at this time, may continue vancomycin for now until final cultures back.  3. Anemia unspecified D64.9: hgb down to 6.8, agree with plans for blood transfusion.   LOS: 2 Arnelle Nale 8/21/201612:14 PM

## 2015-01-13 NOTE — Progress Notes (Signed)
Dr. Anne Hahn notified of patients FSBS 453 and recheck 438. New orders entered by MD for SSI coverage at night, now. Order to give SSI per order, along with scheduled dose of Lantus and then recheck in 2 hours. Call MD if FSBS is >300.

## 2015-01-13 NOTE — Progress Notes (Signed)
Patient ID: Gina Andersen, female   DOB: 08-21-1934, 80 y.o.   MRN: [id number] [facility] PROGRESS NOTE  PCP: Nelda Bucks., MD  HPI/Subjective: Patient awakened from sleep. Feels weak. Some discomfort in the abdomen when palpating.   Objective: Filed Vitals:   01/13/15 0539  BP: 121/52  Pulse: 75  Temp: 97.5 F (36.4 C)  Resp: 18    Filed Weights   01/11/15 2020 01/12/15 0500 01/13/15 0423  Weight: 55.792 kg (123 lb) 56.7 kg (125 lb) 57.153 kg (126 lb)    ROS: Review of Systems  Constitutional: Negative for fever and chills.  Eyes: Negative for blurred vision.  Respiratory: Negative for cough and shortness of breath.   Cardiovascular: Negative for chest pain.  Gastrointestinal: Negative for nausea, vomiting, abdominal pain, diarrhea and constipation.  Genitourinary: Negative for dysuria.  Musculoskeletal: Negative for joint pain.  Neurological: Negative for dizziness and headaches.   Exam: Physical Exam  Constitutional: She is oriented to person, place, and time.  HENT:  Nose: No mucosal edema.  Mouth/Throat: No oropharyngeal exudate or posterior oropharyngeal edema.  Eyes: Conjunctivae, EOM and lids are normal. Pupils are equal, round, and reactive to light.  Neck: No JVD present. Carotid bruit is not present. No edema present. No thyroid mass and no thyromegaly present.  Cardiovascular: S1 normal and S2 normal.  Exam reveals no gallop.   No murmur heard. Pulses:      Dorsalis pedis pulses are 2+ on the right side, and 2+ on the left side.  Respiratory: No respiratory distress. She has no wheezes. She has no rhonchi. She has no rales.  GI: Soft. Bowel sounds are normal. There is tenderness in the suprapubic area.  Musculoskeletal:       Right ankle: She exhibits no swelling.       Left ankle: She exhibits no swelling.  Lymphadenopathy:    She has no cervical adenopathy.  Neurological: She is alert and oriented to person, place, and time.   Hearing impairment  Skin: Skin is warm. No rash noted. Nails show no clubbing.  Psychiatric: She has a normal mood and affect.    Data Reviewed: Basic Metabolic Panel:  Recent Labs Lab 01/11/15 2011 01/12/15 0121 01/13/15 0425  NA 128* 129* 132*  K 3.8 3.5 3.9  CL 96* 102 106  CO2 22 22 19*  GLUCOSE 431* 437* 332*  BUN 120* 98* 99*  CREATININE 4.61* 4.20* 4.23*  CALCIUM 8.7* 8.0* 7.9*   Liver Function Tests:  Recent Labs Lab 01/12/15 0121  AST 30  ALT 11*  ALKPHOS 150*  BILITOT 0.4  PROT 5.4*  ALBUMIN 1.8*   CBC:  Recent Labs Lab 01/11/15 2011 01/12/15 0121 01/13/15 0425  WBC 13.3* 10.0 8.7  NEUTROABS 10.8*  --   --   HGB 7.7* 7.4* 6.8*  HCT 23.0* 21.8* 20.2*  MCV 92.1 92.3 91.8  PLT 192 174 174   Cardiac Enzymes:  Recent Labs Lab 01/11/15 2011  TROPONINI 0.03    CBG:  Recent Labs Lab 01/12/15 1059 01/12/15 1115 01/12/15 1708 01/12/15 2207 01/13/15 0718  GLUCAP 420* 391* 326* 326* 303*    Recent Results (from the past 240 hour(s))  Urine culture     Status: None (Preliminary result)   Collection Time: 01/11/15  8:47 PM  Result Value Ref Range Status   Specimen Description URINE, CLEAN CATCH  Final   Special Requests NONE  Final   Culture HOLDING FOR POSSIBLE PATHOGEN  Final  Report Status PENDING  Incomplete  Culture, blood (routine x 2)     Status: None (Preliminary result)   Collection Time: 01/11/15 10:41 PM  Result Value Ref Range Status   Specimen Description BLOOD LEFT ASSIST CONTROL  Final   Special Requests BOTTLES DRAWN AEROBIC AND ANAEROBIC 4CC  Final   Culture  Setup Time   Final    GRAM NEGATIVE RODS AEROBIC BOTTLE ONLY CRITICAL VALUE NOTED.  VALUE IS CONSISTENT WITH PREVIOUSLY REPORTED AND CALLED VALUE.    Culture   Final    GRAM NEGATIVE RODS AEROBIC BOTTLE ONLY IDENTIFICATION AND SUSCEPTIBILITIES TO FOLLOW    Report Status PENDING  Incomplete  Culture, blood (routine x 2)     Status: None (Preliminary  result)   Collection Time: 01/11/15 10:41 PM  Result Value Ref Range Status   Specimen Description BLOOD RIGHT ASSIST CONTROL  Final   Special Requests BOTTLES DRAWN AEROBIC AND ANAEROBIC 4CC  Final   Culture  Setup Time   Final    GRAM NEGATIVE RODS IN BOTH AEROBIC AND ANAEROBIC BOTTLES CRITICAL RESULT CALLED TO, READ BACK BY AND VERIFIED WITH: CHRIS BENNETT,RN 01/12/2015 AT 1356 BY JRS.    Culture   Final    GRAM NEGATIVE RODS IN BOTH AEROBIC AND ANAEROBIC BOTTLES IDENTIFICATION AND SUSCEPTIBILITIES TO FOLLOW    Report Status PENDING  Incomplete  MRSA PCR Screening     Status: Abnormal   Collection Time: 01/12/15  7:11 AM  Result Value Ref Range Status   MRSA by PCR POSITIVE (A) NEGATIVE Final    Comment:        The GeneXpert MRSA Assay (FDA approved for NASAL specimens only), is one component of a comprehensive MRSA colonization surveillance program. It is not intended to diagnose MRSA infection nor to guide or monitor treatment for MRSA infections. CRITICAL RESULT CALLED TO, READ BACK BY AND VERIFIED WITH: [name] HODGE AT 1610 ON 01/12/15 BY QSD      Studies: US Renal  01/12/2015   CLINICAL DATA:  Patient with acute renal failure.  EXAM: RENAL / URINARY TRACT ULTRASOUND COMPLETE  COMPARISON:  CT abdomen pelvis 01/27/2013  FINDINGS: Right Kidney:  Length: 12.0 cm. Mild hydronephrosis. There are a few echogenic shadowing foci within the right kidney which are nonspecific however may represent small stones.  Left Kidney:  Length: 9.2 cm. Mild hydronephrosis. There are multiple echogenic foci which may represent small stones.  Bladder:  There is a mixed echogenicity region within the dependent aspect of the bladder which demonstrates some mobility measuring up to 7 cm, potentially representing debris.  IMPRESSION: Bilateral hydronephrosis.  Echogenic foci within the kidneys bilaterally raising the possibility of bilateral nephrolithiasis.  Large isoechoic area within the posterior  aspect of the bladder measuring up to 7 cm may represent debris however bladder mass is not excluded. Recommend correlation with urinalysis and direct visualization.   Electronically Signed   By: Annia Belt M.D.   On: 01/12/2015 16:42   Dg Chest Port 1 View  01/11/2015   CLINICAL DATA:  Patient with altered mental status.  EXAM: PORTABLE CHEST - 1 VIEW  COMPARISON:  Chest radiograph 08/10/2013  FINDINGS: Monitoring leads overlie the patient. Stable enlarged cardiac and mediastinal contours. Pulmonary vascular redistribution and bilateral predominately perihilar interstitial pulmonary opacities.  IMPRESSION: Cardiomegaly. Mild interstitial pulmonary opacities suggestive of mild edema.   Electronically Signed   By: Annia Belt M.D.   On: 01/11/2015 22:44    Scheduled Meds: . aspirin EC  81 mg Oral Daily  . Chlorhexidine Gluconate Cloth  6 each Topical Q0600  . heparin  5,000 Units Subcutaneous 3 times per day  . insulin aspart  0-9 Units Subcutaneous TID WC  . insulin glargine  10 Units Subcutaneous QHS  . latanoprost  1 drop Both Eyes QHS  . levothyroxine  50 mcg Oral QAC breakfast  . mupirocin ointment  1 application Nasal BID  . pantoprazole  40 mg Oral QAC breakfast  . piperacillin-tazobactam (ZOSYN)  IV  3.375 g Intravenous Q12H  . pregabalin  25 mg Oral BID  . sodium chloride  3 mL Intravenous Q12H    Assessment/Plan:  1. Severe sepsis, acute cystitis with hematuria. Gram-negative rods growing in the blood and urine. Continue the Zosyn for right now. Once urine culture is back I will adjust antibiotics. 2. Acute renal failure- continue IV fluid hydration. Likely ATN. Patient having some lower abdominal discomfort with palpation. On renal ultrasound, bilateral hydronephrosis and 3 in the urinary bladder. I will get a urology consultation and place a Foley catheter. Creatinine the same today as it was yesterday. 3. Hyponatremia- improved with hydration. 4. Diabetes type 2 uncontrolled-  started low dose Lantus. 5. Hypothyroidism unspecified continue levothyroxine and check a TSH in the morning. 6. Gastroesophageal reflux disease without esophagitis continue proton pump inhibitor 7. Glaucoma unspecified continue latanoprost. 8. Hypokalemia replace potassium orally 9. Anemia unspecified. Today's hemoglobin is even lower than yesterday. This is likely with dilutional with the IV fluids. I did talk to the patient about a transfusion but I'm not sure she understands me completely. I tried to contact family members about a blood transfusion but no working phone numbers in the computer. I spoke with the social worker and nursing staff about this. I need a working phone number. 10. I will do teds and SCDs for DVT prophylaxis and get rid of the heparin subcutaneous.  Code Status:     Code Status Orders        Start     Ordered   01/12/15 0046  Full code   Continuous     01/12/15 0045    Advance Directive Documentation        Most Recent Value   Type of Advance Directive  Healthcare Power of Attorney   Pre-existing out of facility DNR order (yellow form or pink MOST form)     "MOST" Form in Place?       Disposition Plan: When sepsis better back to [facility]  Consultants:  Nephrology.  Urology  Time spent: 26 minutes  Alford Highland  [facility]

## 2015-01-14 ENCOUNTER — Inpatient Hospital Stay: Payer: Medicare Other

## 2015-01-14 DIAGNOSIS — N390 Urinary tract infection, site not specified: Secondary | ICD-10-CM

## 2015-01-14 DIAGNOSIS — A419 Sepsis, unspecified organism: Secondary | ICD-10-CM

## 2015-01-14 LAB — PROTEIN / CREATININE RATIO, URINE
CREATININE, URINE: 41 mg/dL
Protein Creatinine Ratio: 0.93 mg/mg{Cre} — ABNORMAL HIGH (ref 0.00–0.15)
TOTAL PROTEIN, URINE: 38 mg/dL

## 2015-01-14 LAB — TYPE AND SCREEN
ABO/RH(D): O POS
ANTIBODY SCREEN: NEGATIVE
Unit division: 0

## 2015-01-14 LAB — BASIC METABOLIC PANEL
ANION GAP: 7 (ref 5–15)
BUN: 84 mg/dL — AB (ref 6–20)
CALCIUM: 8 mg/dL — AB (ref 8.9–10.3)
CO2: 19 mmol/L — AB (ref 22–32)
Chloride: 108 mmol/L (ref 101–111)
Creatinine, Ser: 3.98 mg/dL — ABNORMAL HIGH (ref 0.44–1.00)
GFR calc Af Amer: 12 mL/min — ABNORMAL LOW (ref >60)
GFR calc non Af Amer: 10 mL/min — ABNORMAL LOW (ref >60)
GLUCOSE: 286 mg/dL — AB (ref 65–99)
Potassium: 3.6 mmol/L (ref 3.5–5.1)
Sodium: 134 mmol/L — ABNORMAL LOW (ref 135–145)

## 2015-01-14 LAB — CULTURE, BLOOD (ROUTINE X 2)

## 2015-01-14 LAB — GLUCOSE, CAPILLARY
GLUCOSE-CAPILLARY: 445 mg/dL — AB (ref 65–99)
GLUCOSE-CAPILLARY: 233 mg/dL — AB (ref 65–99)
GLUCOSE-CAPILLARY: 144 mg/dL — AB (ref 65–99)
GLUCOSE-CAPILLARY: 344 mg/dL — AB (ref 65–99)
Glucose-Capillary: 433 mg/dL — ABNORMAL HIGH (ref 65–99)
Glucose-Capillary: 394 mg/dL — ABNORMAL HIGH (ref 65–99)
Glucose-Capillary: 331 mg/dL — ABNORMAL HIGH (ref 65–99)
Glucose-Capillary: 259 mg/dL — ABNORMAL HIGH (ref 65–99)
Glucose-Capillary: 295 mg/dL — ABNORMAL HIGH (ref 65–99)

## 2015-01-14 LAB — PROTEIN ELECTROPHORESIS, SERUM
A/G Ratio: 0.5 — ABNORMAL LOW (ref 0.7–1.7)
ALPHA-1-GLOBULIN: 0.3 g/dL (ref 0.0–0.4)
ALPHA-2-GLOBULIN: 0.7 g/dL (ref 0.4–1.0)
Albumin ELP: 1.7 g/dL — ABNORMAL LOW (ref 2.9–4.4)
Beta Globulin: 0.8 g/dL (ref 0.7–1.3)
GAMMA GLOBULIN: 1.6 g/dL (ref 0.4–1.8)
GLOBULIN, TOTAL: 3.5 g/dL (ref 2.2–3.9)
TOTAL PROTEIN ELP: 5.2 g/dL — AB (ref 6.0–8.5)

## 2015-01-14 LAB — CBC
HEMATOCRIT: 26.8 % — AB (ref 35.0–47.0)
Hemoglobin: 9.4 g/dL — ABNORMAL LOW (ref 12.0–16.0)
MCH: 31.3 pg (ref 26.0–34.0)
MCHC: 35.2 g/dL (ref 32.0–36.0)
MCV: 88.9 fL (ref 80.0–100.0)
PLATELETS: 186 10*3/uL (ref 150–440)
RBC: 3.02 MIL/uL — ABNORMAL LOW (ref 3.80–5.20)
RDW: 15.2 % — AB (ref 11.5–14.5)
WBC: 10.9 10*3/uL (ref 3.6–11.0)

## 2015-01-14 LAB — URINE CULTURE

## 2015-01-14 LAB — MPO/PR-3 (ANCA) ANTIBODIES

## 2015-01-14 LAB — GLOMERULAR BASEMENT MEMBRANE ANTIBODIES: GBM AB: 5 U (ref 0–20)

## 2015-01-14 LAB — PROTEIN ELECTRO, RANDOM URINE
ALBUMIN ELP UR: 22.4 %
Alpha-1-Globulin, U: 1.1 %
Alpha-2-Globulin, U: 10.4 %
Beta Globulin, U: 44.4 %
GAMMA GLOBULIN, U: 21.7 %
M SPIKE UR: 26.5 % — AB
Total Protein, Urine: 131.3 mg/dL

## 2015-01-14 LAB — HEMOGLOBIN A1C: Hgb A1c MFr Bld: 10.6 % — ABNORMAL HIGH (ref 4.0–6.0)

## 2015-01-14 LAB — C4 COMPLEMENT: COMPLEMENT C4, BODY FLUID: 16 mg/dL (ref 14–44)

## 2015-01-14 LAB — C3 COMPLEMENT: C3 COMPLEMENT: 111 mg/dL (ref 82–167)

## 2015-01-14 MED ORDER — INSULIN ASPART 100 UNIT/ML ~~LOC~~ SOLN
15.0000 [IU] | Freq: Once | SUBCUTANEOUS | Status: AC
  Administered 2015-01-14: 04:00:00 15 [IU] via SUBCUTANEOUS

## 2015-01-14 MED ORDER — INSULIN ASPART 100 UNIT/ML ~~LOC~~ SOLN
10.0000 [IU] | Freq: Once | SUBCUTANEOUS | Status: AC
  Administered 2015-01-14: 10 [IU] via SUBCUTANEOUS
  Filled 2015-01-14: qty 10

## 2015-01-14 MED ORDER — CEFTRIAXONE SODIUM 1 G IJ SOLR
1.0000 g | INTRAMUSCULAR | Status: DC
  Administered 2015-01-14: 1 g via INTRAVENOUS
  Filled 2015-01-14 (×2): qty 10

## 2015-01-14 MED ORDER — CEFTRIAXONE SODIUM 2 G IJ SOLR
2.0000 g | INTRAMUSCULAR | Status: DC
  Administered 2015-01-15 – 2015-01-17 (×3): 2 g via INTRAVENOUS
  Filled 2015-01-14 (×5): qty 2

## 2015-01-14 MED ORDER — INSULIN GLARGINE 100 UNIT/ML ~~LOC~~ SOLN
14.0000 [IU] | Freq: Every day | SUBCUTANEOUS | Status: DC
  Administered 2015-01-14: 14 [IU] via SUBCUTANEOUS
  Filled 2015-01-14: qty 0.14

## 2015-01-14 MED ORDER — INSULIN ASPART 100 UNIT/ML ~~LOC~~ SOLN
0.0000 [IU] | Freq: Three times a day (TID) | SUBCUTANEOUS | Status: DC
  Administered 2015-01-14: 18:00:00 8 [IU] via SUBCUTANEOUS
  Administered 2015-01-14: 11 [IU] via SUBCUTANEOUS
  Administered 2015-01-14: 2 [IU] via SUBCUTANEOUS
  Administered 2015-01-14: 8 [IU] via SUBCUTANEOUS
  Administered 2015-01-15: 08:00:00 11 [IU] via SUBCUTANEOUS
  Administered 2015-01-15 (×2): 15 [IU] via SUBCUTANEOUS
  Administered 2015-01-15: 11 [IU] via SUBCUTANEOUS
  Administered 2015-01-16 (×2): 15 [IU] via SUBCUTANEOUS
  Administered 2015-01-16: 09:00:00 11 [IU] via SUBCUTANEOUS
  Administered 2015-01-16 – 2015-01-17 (×2): 15 [IU] via SUBCUTANEOUS
  Administered 2015-01-17: 8 [IU] via SUBCUTANEOUS
  Administered 2015-01-17 (×2): 15 [IU] via SUBCUTANEOUS
  Administered 2015-01-18: 3 [IU] via SUBCUTANEOUS
  Filled 2015-01-14: qty 2
  Filled 2015-01-14: qty 15
  Filled 2015-01-14: qty 8
  Filled 2015-01-14 (×2): qty 15
  Filled 2015-01-14: qty 8
  Filled 2015-01-14 (×2): qty 15
  Filled 2015-01-14 (×3): qty 11
  Filled 2015-01-14 (×4): qty 15
  Filled 2015-01-14: qty 11
  Filled 2015-01-14: qty 3
  Filled 2015-01-14: qty 8

## 2015-01-14 NOTE — Plan of Care (Signed)
Problem: Discharge Progression Outcomes Goal: Discharge plan in place and appropriate Outcome: Progressing Plan of Care Progress to Goal: Pt's family doesn't want her to return to [facility].  Prefers she goes to [facility].  Pt's daughter Claud Kelp wants Care Mgr to call her at [phone].  U/S today showed bilateral hydronephrosis.  Had urology consult today.  Previous note mentioned possibility of bilateral stents.  Will have another U/S tomorrow.  Abx changed to rocephin.  Hgb improved to 9.4 today.

## 2015-01-14 NOTE — Progress Notes (Addendum)
Dr. Anne Hahn notified of continued elevated FSBS currently at 394. New order received to give SSI coverage according to FSBS. 394 requires 15 units, order to give 15 units Novolog now.

## 2015-01-14 NOTE — Plan of Care (Addendum)
Problem: Discharge Progression Outcomes Goal: Other Discharge Outcomes/Goals Outcome: Progressing Plan of care progress to goals: Discharge plan- pt is from [facility] and family is requesting that pt not be transferred back to [facility] and is requesting [facility]; social worker/case management aware. Pain control- discomfort noted with perineum care, foley continues to drain dark yellow cloudy urine with small blood clots and sediment noted. Complications resolved/controlled- FSBS continues to be elevated in the 390's -400's. Dr. Anne Hahn notified X's 3 this shift with new orders received each time. Pt has had a total 34 units of Novolog and 10 units of Lantus subcutaneous this shift.   Hemodynamically stable-Creatine continues to be elevated, s/p 1 unit pRBC's for Hgb 6.8, WBC normal. VSS. Increased generalized weakness. Abdomen remains distended. Tolerating diet- tolerating diet, no c/o nausea or vomiting.Meds crushed in applesauce. Activity-Bedrest with repositioning.

## 2015-01-14 NOTE — Progress Notes (Signed)
Urology progress: The patient does not communicate very well. Review of ultrasounds reveals the presence of hydronephrosis. This hydronephrosis is probably long-term creatinine is 3.6-3.9. Patient probably needs to have a bilateral stents placed. We'll consider this in 48 hours. I would do another another ultrasound in the morning to see if there is any resolution of hydronephrosis. I syndrome its long-term or the creatinine would be much higher than the it is today. I could do bilateral stents on Wednesday. I'll check with you tomorrow at noon as to the patient's status. If she can withstand anesthesia we can do bilateral stents if not the percutaneous nephrostomy certainly is a possibility but a less efficacious possibility considering the patient's pain that she has everywhere you touch. She would not tolerate bilateral stents very well.

## 2015-01-14 NOTE — Progress Notes (Signed)
Dr. Betti Cruz notified of patients recheck FSBS at 331. Per Dr. Josephina Shih is trending down, recheck in 1 hour.

## 2015-01-14 NOTE — Progress Notes (Signed)
Inpatient Diabetes Program Recommendations  AACE/ADA: New Consensus Statement on Inpatient Glycemic Control (2013)  Target Ranges:  Prepandial:   less than 140 mg/dL      Peak postprandial:   less than 180 mg/dL (1-2 hours)      Critically ill patients:  140 - 180 mg/dL   Review of Glycemic Control:  Results for BEVIN, DAS (MRN [id number]) as of 01/14/2015 10:02  Ref. Range 01/14/2015 01:02 01/14/2015 02:55 01/14/2015 04:47 01/14/2015 05:37 01/14/2015 08:09  Glucose-Capillary Latest Ref Range: 65-99 mg/dL 409 (H) 811 (H) 914 (H) 233 (H) 144 (H)    Diabetes history: Type 2 diabetes Outpatient Diabetes medications: Lantus 25 units q HS, Humalog correction, Januvia 50 mg daily Current orders for Inpatient glycemic control:  Lantus 10 units q HS, Novolog moderate tid with meals and HS  Note that patient required 25 units of Novolog correction since midnight due to CBG's greater than 400 mg/dL.  Consider increasing Lantus to 25 units q HS (this is home dose) and increase frequency of Novolog correction to q 4 hours.  May also consider checking A1C to determine pre-hospitalization glycemic control.  Thanks, Beryl Meager, RN, BC-ADM Inpatient Diabetes Coordinator Pager [phone] (8a-5p)

## 2015-01-14 NOTE — Progress Notes (Signed)
Urology Consult Follow Up  Subjective: Patient is still confused.  She is unable to respond to direct questioning.  Urine is cloudy yellow in the foley tube.    Anti-infective's: Anti-infectives    Start     Dose/Rate Route Frequency Ordered Stop   01/13/15 0800  vancomycin (VANCOCIN) 500 mg in sodium chloride 0.9 % 100 mL IVPB  Status:  Discontinued     500 mg 100 mL/hr over 60 Minutes Intravenous Every 48 hours 01/12/15 0925 01/12/15 1400   01/12/15 1200  piperacillin-tazobactam (ZOSYN) IVPB 3.375 g     3.375 g 12.5 mL/hr over 240 Minutes Intravenous Every 12 hours 01/12/15 0052     01/12/15 0024  piperacillin-tazobactam (ZOSYN) 3.375 (3-0.375) G injection    Comments:  FLORES, LUIS: cabinet override      01/12/15 0024 01/12/15 1229   01/11/15 2230  cefTRIAXone (ROCEPHIN) 1 g in dextrose 5 % 50 mL IVPB  Status:  Discontinued     1 g 100 mL/hr over 30 Minutes Intravenous  Once 01/11/15 2222 01/11/15 2223   01/11/15 2230  vancomycin (VANCOCIN) IVPB 1000 mg/200 mL premix     1,000 mg 200 mL/hr over 60 Minutes Intravenous  Once 01/11/15 2226 01/11/15 2341   01/11/15 2230  piperacillin-tazobactam (ZOSYN) IVPB 3.375 g     3.375 g 100 mL/hr over 30 Minutes Intravenous  Once 01/11/15 2226 01/12/15 0054      Current Facility-Administered Medications  Medication Dose Route Frequency Provider Last Rate Last Dose  . 0.9 %  sodium chloride infusion   Intravenous Continuous Alford Highland, MD 50 mL/hr at 01/13/15 2250    . acetaminophen (TYLENOL) tablet 650 mg  650 mg Oral Q6H PRN Crissie Figures, MD   650 mg at 01/12/15 2258   Or  . acetaminophen (TYLENOL) suppository 650 mg  650 mg Rectal Q6H PRN Crissie Figures, MD      . Chlorhexidine Gluconate Cloth 2 % PADS 6 each  6 each Topical Q0600 Alford Highland, MD   6 each at 01/14/15 [phone]  . insulin aspart (novoLOG) injection 0-15 Units  0-15 Units Subcutaneous TID AC & HS Oralia Manis, MD      . insulin glargine (LANTUS) injection 10 Units   10 Units Subcutaneous QHS Alford Highland, MD   10 Units at 01/13/15 2250  . latanoprost (XALATAN) 0.005 % ophthalmic solution 1 drop  1 drop Both Eyes QHS Crissie Figures, MD   1 drop at 01/13/15 2250  . levothyroxine (SYNTHROID, LEVOTHROID) tablet 50 mcg  50 mcg Oral QAC breakfast Crissie Figures, MD   50 mcg at 01/13/15 0740  . LORazepam (ATIVAN) tablet 0.5 mg  0.5 mg Oral Q6H PRN Crissie Figures, MD   0.5 mg at 01/12/15 0142  . mupirocin ointment (BACTROBAN) 2 % 1 application  1 application Nasal BID Alford Highland, MD   1 application at 01/13/15 2252  . ondansetron (ZOFRAN) tablet 4 mg  4 mg Oral Q6H PRN Crissie Figures, MD       Or  . ondansetron [facility]) injection 4 mg  4 mg Intravenous Q6H PRN Crissie Figures, MD      . pantoprazole (PROTONIX) EC tablet 40 mg  40 mg Oral QAC breakfast Crissie Figures, MD   40 mg at 01/13/15 0740  . piperacillin-tazobactam (ZOSYN) IVPB 3.375 g  3.375 g Intravenous Q12H Crissie Figures, MD   3.375 g at 01/13/15 2250  . pregabalin (LYRICA) capsule 25 mg  25 mg Oral BID Crissie Figures, MD   25 mg at 01/13/15 2252  . sodium chloride 0.9 % injection 3 mL  3 mL Intravenous Q12H Crissie Figures, MD   3 mL at 01/13/15 2253     Objective: Vital signs in last 24 hours: Temp:  [97.5 F (36.4 C)-98.9 F (37.2 C)] 98.9 F (37.2 C) (08/22 0449) Pulse Rate:  [78-82] 78 (08/22 0449) Resp:  [16-20] 16 (08/22 0449) BP: (112-139)/(49-63) 124/49 mmHg (08/22 0449) SpO2:  [96 %-100 %] 96 % (08/22 0449) Weight:  [131 lb 4.8 oz (59.557 kg)] 131 lb 4.8 oz (59.557 kg) (08/22 0427)  Intake/Output from previous day: 08/21 0701 - 08/22 0700 In: 1567.7 [I.V.:1203.7; Blood:275; IV Piggyback:89] Out: 2526 [Urine:2525; Stool:1] Intake/Output this shift:     Physical Exam Constitutional: Awake and unable to answer questions.  Eyes: Conjunctivae are normal. PERRL. EOMI. Head: Atraumatic. Nose: No congestion/rhinorrhea. Mouth/Throat: Mucous membranes are  moist. Oropharynx non-erythematous. Neck: No stridor.  Cardiovascular:  Good peripheral circulation. Respiratory: Normal respiratory effort. No retractions.  Gastrointestinal: Soft with faint suprapubic tenderness, no rebound or guarding.  No distention.  No CVA tenderness. Genitourinary: deferred Musculoskeletal: No lower extremity tenderness nor edema. No joint effusions. Neurologic:Dementia Skin: Skin is warm, dry and intact. No rash noted.  Lab Results:   Recent Labs  01/13/15 0425 01/14/15 0513  WBC 8.7 10.9  HGB 6.8* 9.4*  HCT 20.2* 26.8*  PLT 174 186   BMET  Recent Labs  01/13/15 0425 01/14/15 0513  NA 132* 134*  K 3.9 3.6  CL 106 108  CO2 19* 19*  GLUCOSE 332* 286*  BUN 99* 84*  CREATININE 4.23* 3.98*  CALCIUM 7.9* 8.0*   PT/INR No results for input(s): LABPROT, INR in the last 72 hours. ABG No results for input(s): PHART, HCO3 in the last 72 hours.  Invalid input(s): PCO2, PO2  Studies/Results: US Renal  01/12/2015   CLINICAL DATA:  Patient with acute renal failure.  EXAM: RENAL / URINARY TRACT ULTRASOUND COMPLETE  COMPARISON:  CT abdomen pelvis 01/27/2013  FINDINGS: Right Kidney:  Length: 12.0 cm. Mild hydronephrosis. There are a few echogenic shadowing foci within the right kidney which are nonspecific however may represent small stones.  Left Kidney:  Length: 9.2 cm. Mild hydronephrosis. There are multiple echogenic foci which may represent small stones.  Bladder:  There is a mixed echogenicity region within the dependent aspect of the bladder which demonstrates some mobility measuring up to 7 cm, potentially representing debris.  IMPRESSION: Bilateral hydronephrosis.  Echogenic foci within the kidneys bilaterally raising the possibility of bilateral nephrolithiasis.  Large isoechoic area within the posterior aspect of the bladder measuring up to 7 cm may represent debris however bladder mass is not excluded. Recommend correlation with urinalysis and  direct visualization.   Electronically Signed   By: Annia Belt M.D.   On: 01/12/2015 16:42     Assessment: Gina Andersen is a 80 year old diabetic female admitted with ARI, altered mental status and sepsis who was found to have bilateral hydronephrosis and urinary retention on RUS.  A foley catheter has been placed for decompression.  Her creatinine and UOP are improving.  Cultures are still pending.  Patient is currently on Zosyn.     Plan: Continue foley due to acute urinary retention Patient will need a follow up renal ultrasound to ensure the hydronephrosis has resolved.  Continue Zosyn until culture results are available.  Urology will continue to follow.  CC:  LOS: 3 days    [facility] 01/14/2015

## 2015-01-14 NOTE — Progress Notes (Signed)
Patient ID: Gina Andersen, female   DOB: 23-Jun-1934, 80 y.o.   MRN: [id number] [facility] PROGRESS NOTE  PCP: Nelda Bucks., MD  HPI/Subjective: Patient complaining of some abdominal pain. As per nurse, having bowel movements. Foley catheter was placed yesterday for bladder distention and debris in the bladder with hydronephrosis. As per nurse, patient complains of pain wherever you touch her.   Objective: Filed Vitals:   01/14/15 0449  BP: 124/49  Pulse: 78  Temp: 98.9 F (37.2 C)  Resp: 16    Filed Weights   01/12/15 0500 01/13/15 0423 01/14/15 0427  Weight: 56.7 kg (125 lb) 57.153 kg (126 lb) 59.557 kg (131 lb 4.8 oz)    ROS: Review of Systems  Constitutional: Negative for fever and chills.  Eyes: Negative for blurred vision.  Respiratory: Negative for cough and shortness of breath.   Cardiovascular: Negative for chest pain.  Gastrointestinal: Positive for abdominal pain. Negative for nausea, vomiting, diarrhea and constipation.  Genitourinary: Negative for dysuria.  Musculoskeletal: Positive for myalgias. Negative for joint pain.  Neurological: Negative for dizziness and headaches.   Exam: Physical Exam  HENT:  Nose: No mucosal edema.  Mouth/Throat: No oropharyngeal exudate or posterior oropharyngeal edema.  Eyes: Conjunctivae, EOM and lids are normal. Pupils are equal, round, and reactive to light.  Neck: No JVD present. Carotid bruit is not present. No edema present. No thyroid mass and no thyromegaly present.  Cardiovascular: S1 normal and S2 normal.  Exam reveals no gallop.   No murmur heard. Pulses:      Dorsalis pedis pulses are 2+ on the right side, and 2+ on the left side.  Respiratory: No respiratory distress. She has no wheezes. She has no rhonchi. She has no rales.  GI: Soft. Bowel sounds are normal. There is tenderness in the suprapubic area.  Musculoskeletal:       Right ankle: She exhibits no swelling.       Left ankle: She  exhibits no swelling.  Lymphadenopathy:    She has no cervical adenopathy.  Neurological: She is alert.  Hearing impairment  Skin: Skin is warm. No rash noted. Nails show no clubbing.  Psychiatric: She has a normal mood and affect.    Data Reviewed: Basic Metabolic Panel:  Recent Labs Lab 01/11/15 2011 01/12/15 0121 01/13/15 0425 01/14/15 0513  NA 128* 129* 132* 134*  K 3.8 3.5 3.9 3.6  CL 96* 102 106 108  CO2 22 22 19* 19*  GLUCOSE 431* 437* 332* 286*  BUN 120* 98* 99* 84*  CREATININE 4.61* 4.20* 4.23* 3.98*  CALCIUM 8.7* 8.0* 7.9* 8.0*   Liver Function Tests:  Recent Labs Lab 01/12/15 0121  AST 30  ALT 11*  ALKPHOS 150*  BILITOT 0.4  PROT 5.4*  ALBUMIN 1.8*   CBC:  Recent Labs Lab 01/11/15 2011 01/12/15 0121 01/13/15 0425 01/14/15 0513  WBC 13.3* 10.0 8.7 10.9  NEUTROABS 10.8*  --   --   --   HGB 7.7* 7.4* 6.8* 9.4*  HCT 23.0* 21.8* 20.2* 26.8*  MCV 92.1 92.3 91.8 88.9  PLT 192 174 174 186   Cardiac Enzymes:  Recent Labs Lab 01/11/15 2011  TROPONINI 0.03    CBG:  Recent Labs Lab 01/14/15 0102 01/14/15 0255 01/14/15 0447 01/14/15 0537 01/14/15 0809  GLUCAP 445* 394* 331* 233* 144*    Recent Results (from the past 240 hour(s))  Urine culture     Status: None (Preliminary result)   Collection Time: 01/11/15  8:47 PM  Result Value Ref Range Status   Specimen Description URINE, CLEAN CATCH  Final   Special Requests NONE  Final   Culture HOLDING FOR POSSIBLE PATHOGEN  Final   Report Status PENDING  Incomplete  Culture, blood (routine x 2)     Status: None (Preliminary result)   Collection Time: 01/11/15 10:41 PM  Result Value Ref Range Status   Specimen Description BLOOD LEFT ASSIST CONTROL  Final   Special Requests BOTTLES DRAWN AEROBIC AND ANAEROBIC 4CC  Final   Culture  Setup Time   Final    GRAM NEGATIVE RODS AEROBIC BOTTLE ONLY CRITICAL VALUE NOTED.  VALUE IS CONSISTENT WITH PREVIOUSLY REPORTED AND CALLED VALUE.    Culture  ESCHERICHIA COLI AEROBIC BOTTLE ONLY   Final   Report Status PENDING  Incomplete   Organism ID, Bacteria ESCHERICHIA COLI  Final      Susceptibility   Escherichia coli - MIC*    AMPICILLIN <=2 SENSITIVE Sensitive     CEFTAZIDIME <=1 SENSITIVE Sensitive     CEFAZOLIN <=4 SENSITIVE Sensitive     CEFTRIAXONE <=1 SENSITIVE Sensitive     CIPROFLOXACIN >=4 RESISTANT Resistant     GENTAMICIN <=1 SENSITIVE Sensitive     IMIPENEM <=0.25 SENSITIVE Sensitive     TRIMETH/SULFA >=320 RESISTANT Resistant     * ESCHERICHIA COLI  Culture, blood (routine x 2)     Status: None   Collection Time: 01/11/15 10:41 PM  Result Value Ref Range Status   Specimen Description BLOOD RIGHT ASSIST CONTROL  Final   Special Requests BOTTLES DRAWN AEROBIC AND ANAEROBIC 4CC  Final   Culture  Setup Time   Final    GRAM NEGATIVE RODS IN BOTH AEROBIC AND ANAEROBIC BOTTLES CRITICAL RESULT CALLED TO, READ BACK BY AND VERIFIED WITH: CHRIS BENNETT,RN 01/12/2015 AT 1356 BY JRS.    Culture   Final    ESCHERICHIA COLI IN BOTH AEROBIC AND ANAEROBIC BOTTLES    Report Status 01/14/2015 FINAL  Final   Organism ID, Bacteria ESCHERICHIA COLI  Final      Susceptibility   Escherichia coli - MIC*    AMPICILLIN <=2 SENSITIVE Sensitive     CEFTAZIDIME <=1 SENSITIVE Sensitive     CEFAZOLIN <=4 SENSITIVE Sensitive     CEFTRIAXONE <=1 SENSITIVE Sensitive     CIPROFLOXACIN >=4 RESISTANT Resistant     GENTAMICIN <=1 SENSITIVE Sensitive     IMIPENEM <=0.25 SENSITIVE Sensitive     TRIMETH/SULFA >=320 RESISTANT Resistant     PIP/TAZO Value in next row Sensitive      SENSITIVE<=4    * ESCHERICHIA COLI  MRSA PCR Screening     Status: Abnormal   Collection Time: 01/12/15  7:11 AM  Result Value Ref Range Status   MRSA by PCR POSITIVE (A) NEGATIVE Final    Comment:        The GeneXpert MRSA Assay (FDA approved for NASAL specimens only), is one component of a comprehensive MRSA colonization surveillance program. It is not intended  to diagnose MRSA infection nor to guide or monitor treatment for MRSA infections. CRITICAL RESULT CALLED TO, READ BACK BY AND VERIFIED WITH: [name] HODGE AT 1610 ON 01/12/15 BY QSD      Studies: US Renal  01/14/2015   CLINICAL DATA:  Followup hydronephrosis status post Foley catheter placement.  EXAM: RENAL / URINARY TRACT ULTRASOUND COMPLETE  COMPARISON:  01/12/2015  FINDINGS: Right Kidney:  Length: 11.5 cm. Normal renal cortical thickness and echogenicity without focal lesions.  Persistent moderate hydronephrosis. No definite renal calculi.  Left Kidney:  Length: 9.6 cm. Mild renal cortical thinning and probable scarring change. Persistent hydronephrosis.  Bladder:  Decompressed by a Foley catheter.  Other:  Small amount of ascites.  IMPRESSION: Persistent bilateral hydronephrosis.   Electronically Signed   By: Rudie Meyer M.D.   On: 01/14/2015 10:03   US Renal  01/12/2015   CLINICAL DATA:  Patient with acute renal failure.  EXAM: RENAL / URINARY TRACT ULTRASOUND COMPLETE  COMPARISON:  CT abdomen pelvis 01/27/2013  FINDINGS: Right Kidney:  Length: 12.0 cm. Mild hydronephrosis. There are a few echogenic shadowing foci within the right kidney which are nonspecific however may represent small stones.  Left Kidney:  Length: 9.2 cm. Mild hydronephrosis. There are multiple echogenic foci which may represent small stones.  Bladder:  There is a mixed echogenicity region within the dependent aspect of the bladder which demonstrates some mobility measuring up to 7 cm, potentially representing debris.  IMPRESSION: Bilateral hydronephrosis.  Echogenic foci within the kidneys bilaterally raising the possibility of bilateral nephrolithiasis.  Large isoechoic area within the posterior aspect of the bladder measuring up to 7 cm may represent debris however bladder mass is not excluded. Recommend correlation with urinalysis and direct visualization.   Electronically Signed   By: Annia Belt M.D.   On: 01/12/2015 16:42     Scheduled Meds: . cefTRIAXone (ROCEPHIN)  IV  1 g Intravenous Q24H  . Chlorhexidine Gluconate Cloth  6 each Topical Q0600  . insulin aspart  0-15 Units Subcutaneous TID AC & HS  . insulin glargine  10 Units Subcutaneous QHS  . latanoprost  1 drop Both Eyes QHS  . levothyroxine  50 mcg Oral QAC breakfast  . mupirocin ointment  1 application Nasal BID  . pantoprazole  40 mg Oral QAC breakfast  . pregabalin  25 mg Oral BID  . sodium chloride  3 mL Intravenous Q12H    Assessment/Plan:  1. Severe sepsis, acute cystitis with hematuria. Escherichia coli growing in the blood cultures. Urine culture still pending. Discontinue IV Zosyn and switched to IV Rocephin.  2. Acute renal failure- continue IV fluid hydration. Likely ATN. Patient having some lower abdominal discomfort with palpation. On renal ultrasound, bilateral hydronephrosis and debris in the bladder. Creatinine slowly improving. Continue Foley catheter. Likely will need a cystoscopy at some point. 3. Hyponatremia- improved with hydration. 4. Diabetes type 2 uncontrolled- continue low dose Lantus. 5. Hypothyroidism unspecified continue levothyroxine 6. Gastroesophageal reflux disease without esophagitis continue proton pump inhibitor 7. Glaucoma unspecified continue latanoprost. 8. Hypokalemia replaced 9. Anemia unspecified. Responded well to 1 unit of blood. 10. I will do teds and SCDs for DVT prophylaxis and get rid of the heparin subcutaneous.  Code Status:     Code Status Orders        Start     Ordered   01/12/15 0046  Full code   Continuous     01/12/15 0045    Advance Directive Documentation        Most Recent Value   Type of Advance Directive  Healthcare Power of Attorney   Pre-existing out of facility DNR order (yellow form or pink MOST form)     "MOST" Form in Place?       Disposition Plan: When sepsis better back to [facility]  Consultants:  Nephrology.  Urology  Time spent: 22  minutes  Alford Highland  [facility]

## 2015-01-14 NOTE — Progress Notes (Signed)
Subjective:  Pt seen at bedside. Appears to be confused today Cr about the same at 4.23.-> 3.98 UOP noted to be 2525 cc over the past 24 hours. hgb 9.4   Still lethargic but arousable.   Objective:  Vital signs in last 24 hours:  Temp:  [97.5 F (36.4 C)-98.9 F (37.2 C)] 98.9 F (37.2 C) (08/22 0449) Pulse Rate:  [78-82] 78 (08/22 0449) Resp:  [16-20] 16 (08/22 0449) BP: (112-139)/(49-63) 124/49 mmHg (08/22 0449) SpO2:  [96 %-100 %] 96 % (08/22 0449) Weight:  [59.557 kg (131 lb 4.8 oz)] 59.557 kg (131 lb 4.8 oz) (08/22 0427)  Weight change: 2.404 kg (5 lb 4.8 oz) Filed Weights   01/12/15 0500 01/13/15 0423 01/14/15 0427  Weight: 56.7 kg (125 lb) 57.153 kg (126 lb) 59.557 kg (131 lb 4.8 oz)    Intake/Output: I/O last 3 completed shifts: In: 2447.7 [I.V.:2033.7; Blood:275; IV Piggyback:139] Out: 2526 [Urine:2525; Stool:1]   Intake/Output this shift:     Physical Exam: General: NAD, resting in bed  Head: Normocephalic, atraumatic. OMs dry  Eyes: Anicteric  Neck: Supple, trachea midline  Lungs:  Clear to auscultation normal effort  Heart: Regular rate and rhythm S1S2  Abdomen:  Soft, nontender, BS present  Extremities:  trace peripheral edema.  Neurologic: Lethargic but arousable, confused   Skin: No lesions    Foley in place     Basic Metabolic Panel:  Recent Labs Lab 01/11/15 2011 01/12/15 0121 01/13/15 0425 01/14/15 0513  NA 128* 129* 132* 134*  K 3.8 3.5 3.9 3.6  CL 96* 102 106 108  CO2 22 22 19* 19*  GLUCOSE 431* 437* 332* 286*  BUN 120* 98* 99* 84*  CREATININE 4.61* 4.20* 4.23* 3.98*  CALCIUM 8.7* 8.0* 7.9* 8.0*    Liver Function Tests:  Recent Labs Lab 01/12/15 0121  AST 30  ALT 11*  ALKPHOS 150*  BILITOT 0.4  PROT 5.4*  ALBUMIN 1.8*   No results for input(s): LIPASE, AMYLASE in the last 168 hours. No results for input(s): AMMONIA in the last 168 hours.  CBC:  Recent Labs Lab 01/11/15 2011 01/12/15 0121 01/13/15 0425  01/14/15 0513  WBC 13.3* 10.0 8.7 10.9  NEUTROABS 10.8*  --   --   --   HGB 7.7* 7.4* 6.8* 9.4*  HCT 23.0* 21.8* 20.2* 26.8*  MCV 92.1 92.3 91.8 88.9  PLT 192 174 174 186    Cardiac Enzymes:  Recent Labs Lab 01/11/15 2011  TROPONINI 0.03    BNP: Invalid input(s): POCBNP  CBG:  Recent Labs Lab 01/14/15 0102 01/14/15 0255 01/14/15 0447 01/14/15 0537 01/14/15 0809  GLUCAP 445* 394* 331* 233* 144*    Microbiology: Results for orders placed or performed during the hospital encounter of 01/11/15  Urine culture     Status: None (Preliminary result)   Collection Time: 01/11/15  8:47 PM  Result Value Ref Range Status   Specimen Description URINE, CLEAN CATCH  Final   Special Requests NONE  Final   Culture HOLDING FOR POSSIBLE PATHOGEN  Final   Report Status PENDING  Incomplete  Culture, blood (routine x 2)     Status: None (Preliminary result)   Collection Time: 01/11/15 10:41 PM  Result Value Ref Range Status   Specimen Description BLOOD LEFT ASSIST CONTROL  Final   Special Requests BOTTLES DRAWN AEROBIC AND ANAEROBIC 4CC  Final   Culture  Setup Time   Final    GRAM NEGATIVE RODS AEROBIC BOTTLE ONLY CRITICAL VALUE NOTED.  VALUE IS CONSISTENT WITH PREVIOUSLY REPORTED AND CALLED VALUE.    Culture ESCHERICHIA COLI AEROBIC BOTTLE ONLY   Final   Report Status PENDING  Incomplete   Organism ID, Bacteria ESCHERICHIA COLI  Final      Susceptibility   Escherichia coli - MIC*    AMPICILLIN <=2 SENSITIVE Sensitive     CEFTAZIDIME <=1 SENSITIVE Sensitive     CEFAZOLIN <=4 SENSITIVE Sensitive     CEFTRIAXONE <=1 SENSITIVE Sensitive     CIPROFLOXACIN >=4 RESISTANT Resistant     GENTAMICIN <=1 SENSITIVE Sensitive     IMIPENEM <=0.25 SENSITIVE Sensitive     TRIMETH/SULFA >=320 RESISTANT Resistant     * ESCHERICHIA COLI  Culture, blood (routine x 2)     Status: None   Collection Time: 01/11/15 10:41 PM  Result Value Ref Range Status   Specimen Description BLOOD RIGHT  ASSIST CONTROL  Final   Special Requests BOTTLES DRAWN AEROBIC AND ANAEROBIC 4CC  Final   Culture  Setup Time   Final    GRAM NEGATIVE RODS IN BOTH AEROBIC AND ANAEROBIC BOTTLES CRITICAL RESULT CALLED TO, READ BACK BY AND VERIFIED WITH: CHRIS BENNETT,RN 01/12/2015 AT 1356 BY JRS.    Culture   Final    ESCHERICHIA COLI IN BOTH AEROBIC AND ANAEROBIC BOTTLES    Report Status 01/14/2015 FINAL  Final   Organism ID, Bacteria ESCHERICHIA COLI  Final      Susceptibility   Escherichia coli - MIC*    AMPICILLIN <=2 SENSITIVE Sensitive     CEFTAZIDIME <=1 SENSITIVE Sensitive     CEFAZOLIN <=4 SENSITIVE Sensitive     CEFTRIAXONE <=1 SENSITIVE Sensitive     CIPROFLOXACIN >=4 RESISTANT Resistant     GENTAMICIN <=1 SENSITIVE Sensitive     IMIPENEM <=0.25 SENSITIVE Sensitive     TRIMETH/SULFA >=320 RESISTANT Resistant     PIP/TAZO Value in next row Sensitive      SENSITIVE<=4    * ESCHERICHIA COLI  MRSA PCR Screening     Status: Abnormal   Collection Time: 01/12/15  7:11 AM  Result Value Ref Range Status   MRSA by PCR POSITIVE (A) NEGATIVE Final    Comment:        The GeneXpert MRSA Assay (FDA approved for NASAL specimens only), is one component of a comprehensive MRSA colonization surveillance program. It is not intended to diagnose MRSA infection nor to guide or monitor treatment for MRSA infections. CRITICAL RESULT CALLED TO, READ BACK BY AND VERIFIED WITH: [name] HODGE AT 0946 ON 01/12/15 BY QSD     Coagulation Studies: No results for input(s): LABPROT, INR in the last 72 hours.  Urinalysis:  Recent Labs  01/11/15 2047  COLORURINE BROWN*  LABSPEC 1.015  PHURINE 0.0*  GLUCOSEU TEST NOT REPORTED DUE TO COLOR INTERFERENCE OF URINE PIGMENT*  HGBUR TEST NOT REPORTED DUE TO COLOR INTERFERENCE OF URINE PIGMENT*  BILIRUBINUR TEST NOT REPORTED DUE TO COLOR INTERFERENCE OF URINE PIGMENT*  KETONESUR TEST NOT REPORTED DUE TO COLOR INTERFERENCE OF URINE PIGMENT*  PROTEINUR TEST NOT  REPORTED DUE TO COLOR INTERFERENCE OF URINE PIGMENT*  NITRITE TEST NOT REPORTED DUE TO COLOR INTERFERENCE OF URINE PIGMENT*  LEUKOCYTESUR TEST NOT REPORTED DUE TO COLOR INTERFERENCE OF URINE PIGMENT*      Imaging: US Renal  01/14/2015   CLINICAL DATA:  Followup hydronephrosis status post Foley catheter placement.  EXAM: RENAL / URINARY TRACT ULTRASOUND COMPLETE  COMPARISON:  01/12/2015  FINDINGS: Right Kidney:  Length: 11.5 cm. Normal renal  cortical thickness and echogenicity without focal lesions. Persistent moderate hydronephrosis. No definite renal calculi.  Left Kidney:  Length: 9.6 cm. Mild renal cortical thinning and probable scarring change. Persistent hydronephrosis.  Bladder:  Decompressed by a Foley catheter.  Other:  Small amount of ascites.  IMPRESSION: Persistent bilateral hydronephrosis.   Electronically Signed   By: Marijo Sanes M.D.   On: 01/14/2015 10:03   US Renal  01/12/2015   CLINICAL DATA:  Patient with acute renal failure.  EXAM: RENAL / URINARY TRACT ULTRASOUND COMPLETE  COMPARISON:  CT abdomen pelvis 01/27/2013  FINDINGS: Right Kidney:  Length: 12.0 cm. Mild hydronephrosis. There are a few echogenic shadowing foci within the right kidney which are nonspecific however may represent small stones.  Left Kidney:  Length: 9.2 cm. Mild hydronephrosis. There are multiple echogenic foci which may represent small stones.  Bladder:  There is a mixed echogenicity region within the dependent aspect of the bladder which demonstrates some mobility measuring up to 7 cm, potentially representing debris.  IMPRESSION: Bilateral hydronephrosis.  Echogenic foci within the kidneys bilaterally raising the possibility of bilateral nephrolithiasis.  Large isoechoic area within the posterior aspect of the bladder measuring up to 7 cm may represent debris however bladder mass is not excluded. Recommend correlation with urinalysis and direct visualization.   Electronically Signed   By: Lovey Newcomer M.D.    On: 01/12/2015 16:42     Medications:   . sodium chloride 50 mL/hr at 01/13/15 2250   . Chlorhexidine Gluconate Cloth  6 each Topical Q0600  . insulin aspart  0-15 Units Subcutaneous TID AC & HS  . insulin glargine  10 Units Subcutaneous QHS  . latanoprost  1 drop Both Eyes QHS  . levothyroxine  50 mcg Oral QAC breakfast  . mupirocin ointment  1 application Nasal BID  . pantoprazole  40 mg Oral QAC breakfast  . piperacillin-tazobactam (ZOSYN)  IV  3.375 g Intravenous Q12H  . pregabalin  25 mg Oral BID  . sodium chloride  3 mL Intravenous Q12H   acetaminophen **OR** acetaminophen, LORazepam, ondansetron **OR** ondansetron (ZOFRAN) IV  Assessment/ Plan:  80 y.o. female with a PMHx of diabetes mellitus type II, hyperlipidemia, atropic dermatitis, history of cholecystitis, depression, anxiety, hypertension, CKD stage III baseline egfr 69, who was admitted to [facility] on 01/11/2015 for evaluation of altered mental status.   1. Acute renal failure/CKD stage III baseline egfr of 58. Suspect pt became volume depleted due to UTI. Bilateral hydronephrosis noted on renal ultrasound. - Urine output 2525 cc. Serum creatinine improved to 3.98.  - Foley in place. Continue to monitor.   2. UTI: Had TNTC WBCs in the urine, E . Coli growing in the blood,   3. Anemia unspecified D64.9: hgImproved to 9.4 today  4. DM-2/ CKD. Obtain HbA1c and Urine Pr/Cr ratio   LOS: 3 Janira Mandell 8/22/201610:32 AM

## 2015-01-14 NOTE — Evaluation (Signed)
Physical Therapy Evaluation Patient Details Name: Gina Andersen MRN: [id number] DOB: 09-08-1934 Today's Date: 01/14/2015   History of Present Illness  Pt is a 80 y.o. female presenting to hospital with AMS and abnormal kidney function and admitted with UTI and s/p 1 unit PRBC's transfusion.  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  US renal 01/14/15 shows persistent B hydronephrosis.  PMH:  DM type 2, htn, anxiety, chronic anemia.  Clinical Impression  Currently pt demonstrates impairments with strength, ROM, pain, and limitations with functional mobility.  Pt did not consistently keep her eyes open complicating communication via lip reading.  Attempted to call pt's daughter (SW gave PT phone number) but no one answered (to assist with PLOF questions).  Per chart, pt was at LTC at [facility] prior to admission.  Currently pt is max assist with bed mobility but pt resisted trying to sit on edge of bed d/t c/o R>L LE pain with any touch or movement.  Pt would benefit from skilled PT to address above noted impairments and functional limitations.  Recommend pt discharge to LTC vs STR (pending further clarification of prior level of function) when medically appropriate.     Follow Up Recommendations Other (comment) (SNF vs LTC pending prior level of function)    Equipment Recommendations       Recommendations for Other Services       Precautions / Restrictions Precautions Precautions: Fall Precaution Comments: Deaf (reads lips) Restrictions Weight Bearing Restrictions: No      Mobility  Bed Mobility Overal bed mobility: Needs Assistance Bed Mobility: Rolling Rolling: Max assist (logroll to R)         General bed mobility comments: Pt requiring assist for B UE hand holds on B side rails and positioning LE's (knees bent) and with mod assist of therapist able to scoot pt up in bed; unable to get pt to sit on edge of bed d/t significant c/o pain with movement/touch of R>L LE  Transfers                     Ambulation/Gait                Stairs            Wheelchair Mobility    Modified Rankin (Stroke Patients Only)       Balance                                             Pertinent Vitals/Pain Pain Assessment: Faces Faces Pain Scale: Hurts whole lot Pain Location: R>L LE with any touch Pain Descriptors / Indicators: Grimacing;Guarding Pain Intervention(s): Limited activity within patient's tolerance;Monitored during session;Repositioned  Vitals stable and WFL throughout treatment session.    Home Living Family/patient expects to be discharged to::  [facility] LTC resident about 8 months per chart; per chart pt's family wants pt to d/c to [facility] instead)                      Prior Function           Comments: Pt unable to verbalize PLOF and attempted to call pt's daughter Raynelle Fanning but no answer via phone number given to PT by SW.     Hand Dominance        Extremity/Trunk Assessment   Upper Extremity Assessment: Generalized  weakness           Lower Extremity Assessment: Generalized weakness;RLE deficits/detail;LLE deficits/detail RLE Deficits / Details: DF approximately 20 degrees short of neutral (may be limited ROM d/t pt guarding?) LLE Deficits / Details: DF approximately 20 degrees short of neutral (may be limited ROM d/t pt guarding?)     Communication   Communication: Deaf (reads lips)  Cognition Arousal/Alertness: Awake/alert Behavior During Therapy: WFL for tasks assessed/performed Overall Cognitive Status: No family/caregiver present to determine baseline cognitive functioning (Pt oriented to self only)                      General Comments   Nursing cleared pt for participation in physical therapy.  Pt agreeable to PT session once woken.    Exercises        Assessment/Plan    PT Assessment Patient needs continued PT services  PT Diagnosis Generalized weakness;Difficulty  walking   PT Problem List Decreased strength;Decreased range of motion;Decreased activity tolerance;Decreased mobility;Pain  PT Treatment Interventions DME instruction;Gait training;Functional mobility training;Therapeutic activities;Therapeutic exercise;Balance training;Patient/family education;Wheelchair mobility training   PT Goals (Current goals can be found in the Care Plan section) Acute Rehab PT Goals Patient Stated Goal: to have less pain PT Goal Formulation: With patient Time For Goal Achievement: 01/20/2015 Potential to Achieve Goals: Fair    Frequency Min 2X/week   Barriers to discharge Decreased caregiver support      Co-evaluation               End of Session   Activity Tolerance: Patient limited by pain Patient left: in bed;with call bell/phone within reach;with bed alarm set           Time: 2956-2130 PT Time Calculation (min) (ACUTE ONLY): 15 min   Charges:   PT Evaluation $Initial PT Evaluation Tier I: 1 Procedure     PT G CodesHendricks Limes 01-20-2015, 4:01 PM Hendricks Limes, PT [phone]

## 2015-01-14 NOTE — Progress Notes (Signed)
Initial Nutrition Assessment   INTERVENTION:   Meals and Snacks: Cater to patient preferences Medical Food Supplement Therapy: will recommend Might[employer] Shakes on meal trays TID for added nutrition (each shake provides 300kcals and 9g protein)   NUTRITION DIAGNOSIS:   Swallowing difficulty related to dysphagia as evidenced by  (diet order, SLP following).  GOAL:   Patient will meet greater than or equal to 90% of their needs  MONITOR:    (Energy Intake, Electrolyte and renal Profile, Digestive System, Anthropometrics)  REASON FOR ASSESSMENT:   Malnutrition Screening Tool    ASSESSMENT:   Pt admitted with sepsis d/t UTI with urinary retention and acute kidney injury.  Past Medical History  Diagnosis Date  . Hypothyroidism 11/01/1995  . Anxiety 11/01/1995  . Diabetes mellitus type II 05/25/1996    insulin-requiring  . Hyperlipemia 06/25/2002  . Atopic dermatitis   . History of MRI of brain and brain stem 07/10/2005    w amd w/o no mass/infarct small vess dz  . Dehydration     Hosp [facility], hyperglycemia and ketoacidosis (stopped insulin)  . Pancytopenia 06/24-7/05/2007    Tissue D/O  . History of gallstones 11/21/2007    Abd U/S multip gallstones contracted GB CBD ULN Tr hydronephr R  . Acute cholecystitis 12/1/-05/20/08    [facility], s/p lap choleycystectomy CTD DM w/hypoglycemic sz in ER  . Gallstones 05/11/08    abd U/S gallstones pericholeycystic fluid [facility]  . Gastro-esophageal reflux disease without esophagitis   . Depression   . Anxiety disorder   . Dysphagia   . Hearing impaired   . Hypertension   . Carpal tunnel syndrome   . Weakness   . Anemia   . Glaucoma   . Mood disorder   . Kidney disease      Diet Order:  DIET - DYS 1 Room service appropriate?: Yes; Fluid consistency:: Thin    Current Nutrition: Pt ate 100% of breakfast this am with CNA Ayo. CNA reports pt seemed very hungry. Recorded pt ate 75% of dinner last night.  Food/Nutrition-Related  History: Per MST pt with decreased appetite PTA, no family present on visit. Unable to clarify with pt.   Medications: NS at 13mL/hr, Lantus, Novolog  Electrolyte/Renal Profile and Glucose Profile:   Recent Labs Lab 01/12/15 0121 01/13/15 0425 01/14/15 0513  NA 129* 132* 134*  K 3.5 3.9 3.6  CL 102 106 108  CO2 22 19* 19*  BUN 98* 99* 84*  CREATININE 4.20* 4.23* 3.98*  CALCIUM 8.0* 7.9* 8.0*  GLUCOSE 437* 332* 286*   Protein Profile:  Recent Labs Lab 01/12/15 0121  ALBUMIN 1.8*    Gastrointestinal Profile: Last BM: 01/13/2015   Nutrition-Focused Physical Exam Findings:  Unable to complete Nutrition-Focused physical exam at this time as pt not able to fully participate. Pt with severe orbital wasting without some muscle depletion in certain areas, however difficult to clarify further on visit today.   Weight Change: Per CHL, weight has fluctuated, with weight gain within the past year.  Height:   Ht Readings from Last 1 Encounters:  01/11/15  (1.6 m)    Weight:   Wt Readings from Last 1 Encounters:  01/14/15 131 lb 4.8 oz (59.557 kg)    Wt Readings from Last 10 Encounters:  01/14/15 131 lb 4.8 oz (59.557 kg)  08/07/13 111 lb (50.349 kg)  12/06/12 147 lb 7.8 oz (66.9 kg)  01/08/11 128 lb 12 oz (58.401 kg)  10/01/10 129 lb 8 oz (58.741 kg)  09/04/10 130 lb 4 oz (59.081 kg)  06/27/10 129 lb (58.514 kg)  05/08/10 125 lb 4 oz (56.813 kg)  04/09/10 128 lb (58.06 kg)  06/11/09 125 lb 2.1 oz (56.759 kg)    BMI:  Body mass index is 23.26 kg/(m^2).  Estimated Nutritional Needs:   Kcal:  1386-1638kcals, BEE: 1050kcals, TEE: (IF 1.1-1.3)(AF 1.2)   Protein:  48-60g protein (0.8-1.0g/kg)  Fluid:  1490-1754mL of fluid (25-40mL/kg)  EDUCATION NEEDS:   No education needs identified at this time   MODERATE Care Level  Leda Quail, RD, LDN Pager [phone]

## 2015-01-14 NOTE — Progress Notes (Addendum)
Dr. Anne Hahn notified of patients  FSBS 433 and recheck 445. New order entered by MD for Novolog 10units subcutaneous once now and recheck in one hour.

## 2015-01-15 LAB — BASIC METABOLIC PANEL
ANION GAP: 5 (ref 5–15)
BUN: 78 mg/dL — ABNORMAL HIGH (ref 6–20)
CALCIUM: 8 mg/dL — AB (ref 8.9–10.3)
CO2: 20 mmol/L — AB (ref 22–32)
Chloride: 111 mmol/L (ref 101–111)
Creatinine, Ser: 3.85 mg/dL — ABNORMAL HIGH (ref 0.44–1.00)
GFR calc Af Amer: 12 mL/min — ABNORMAL LOW (ref >60)
GFR calc non Af Amer: 10 mL/min — ABNORMAL LOW (ref >60)
GLUCOSE: 363 mg/dL — AB (ref 65–99)
Potassium: 4.4 mmol/L (ref 3.5–5.1)
Sodium: 136 mmol/L (ref 135–145)

## 2015-01-15 LAB — GLUCOSE, CAPILLARY
GLUCOSE-CAPILLARY: 397 mg/dL — AB (ref 65–99)
GLUCOSE-CAPILLARY: 336 mg/dL — AB (ref 65–99)
GLUCOSE-CAPILLARY: 420 mg/dL — AB (ref 65–99)
Glucose-Capillary: 318 mg/dL — ABNORMAL HIGH (ref 65–99)

## 2015-01-15 LAB — CBC
HEMATOCRIT: 26.1 % — AB (ref 35.0–47.0)
Hemoglobin: 8.9 g/dL — ABNORMAL LOW (ref 12.0–16.0)
MCH: 30.6 pg (ref 26.0–34.0)
MCHC: 34 g/dL (ref 32.0–36.0)
MCV: 89.8 fL (ref 80.0–100.0)
Platelets: 184 10*3/uL (ref 150–440)
RBC: 2.91 MIL/uL — ABNORMAL LOW (ref 3.80–5.20)
RDW: 15.3 % — AB (ref 11.5–14.5)
WBC: 9.3 10*3/uL (ref 3.6–11.0)

## 2015-01-15 LAB — TSH: TSH: 19.342 u[IU]/mL — ABNORMAL HIGH (ref 0.350–4.500)

## 2015-01-15 MED ORDER — LEVOTHYROXINE SODIUM 75 MCG PO TABS
75.0000 ug | ORAL_TABLET | Freq: Every day | ORAL | Status: DC
  Administered 2015-01-15 – 2015-01-18 (×4): 75 ug via ORAL
  Filled 2015-01-15 (×4): qty 1

## 2015-01-15 MED ORDER — INSULIN GLARGINE 100 UNIT/ML ~~LOC~~ SOLN
18.0000 [IU] | Freq: Every day | SUBCUTANEOUS | Status: DC
  Administered 2015-01-15: 18 [IU] via SUBCUTANEOUS
  Filled 2015-01-15 (×2): qty 0.18

## 2015-01-15 NOTE — Progress Notes (Addendum)
Subjective:  Pt seen at bedside. Resting quietly. Answers minimal questions. Cr about the same at 4.23.-> 3.98-> 3.85 UOP noted to be 1200 cc over the past 24 hours. hgb 8.9   Still lethargic but arousable.   Objective:  Vital signs in last 24 hours:  Temp:  [97.4 F (36.3 C)-99.6 F (37.6 C)] 99.6 F (37.6 C) (08/23 0543) Pulse Rate:  [76-83] 83 (08/23 0543) Resp:  [16-19] 16 (08/23 0543) BP: (113-126)/(50-53) 119/53 mmHg (08/23 0543) SpO2:  [96 %-99 %] 96 % (08/23 0543)  Weight change:  Filed Weights   01/12/15 0500 01/13/15 0423 01/14/15 0427  Weight: 56.7 kg (125 lb) 57.153 kg (126 lb) 59.557 kg (131 lb 4.8 oz)    Intake/Output: I/O last 3 completed shifts: In: 1324.2 [I.V.:1274.2; IV Piggyback:50] Out: 2652 [Urine:2650; Stool:2]   Intake/Output this shift:  Total I/O In: 240 [P.O.:240] Out: -   Physical Exam: General: NAD, resting in bed  Head: Normocephalic, atraumatic. OMs dry  Eyes: Anicteric  Neck: Supple, trachea midline  Lungs:  Clear to auscultation normal effort  Heart: Regular rate and rhythm S1S2  Abdomen:  Soft, mild diffuse tenderness, BS present  Extremities:  trace peripheral edema.  Neurologic: Lethargic but arousable, answers only a few questions   Skin: No acute  lesions    Foley in place     Basic Metabolic Panel:  Recent Labs Lab 01/11/15 2011 01/12/15 0121 01/13/15 0425 01/14/15 0513 01/15/15 0445  NA 128* 129* 132* 134* 136  K 3.8 3.5 3.9 3.6 4.4  CL 96* 102 106 108 111  CO2 22 22 19* 19* 20*  GLUCOSE 431* 437* 332* 286* 363*  BUN 120* 98* 99* 84* 78*  CREATININE 4.61* 4.20* 4.23* 3.98* 3.85*  CALCIUM 8.7* 8.0* 7.9* 8.0* 8.0*    Liver Function Tests:  Recent Labs Lab 01/12/15 0121  AST 30  ALT 11*  ALKPHOS 150*  BILITOT 0.4  PROT 5.4*  ALBUMIN 1.8*   No results for input(s): LIPASE, AMYLASE in the last 168 hours. No results for input(s): AMMONIA in the last 168 hours.  CBC:  Recent Labs Lab  01/11/15 2011 01/12/15 0121 01/13/15 0425 01/14/15 0513 01/15/15 0445  WBC 13.3* 10.0 8.7 10.9 9.3  NEUTROABS 10.8*  --   --   --   --   HGB 7.7* 7.4* 6.8* 9.4* 8.9*  HCT 23.0* 21.8* 20.2* 26.8* 26.1*  MCV 92.1 92.3 91.8 88.9 89.8  PLT 192 174 174 186 184    Cardiac Enzymes:  Recent Labs Lab 01/11/15 2011  TROPONINI 0.03    BNP: Invalid input(s): POCBNP  CBG:  Recent Labs Lab 01/14/15 0809 01/14/15 1226 01/14/15 1711 01/14/15 2151 01/15/15 0720  GLUCAP 144* 259* 295* 344* 318*    Microbiology: Results for orders placed or performed during the hospital encounter of 01/11/15  Urine culture     Status: None   Collection Time: 01/11/15  8:47 PM  Result Value Ref Range Status   Specimen Description URINE, CLEAN CATCH  Final   Special Requests NONE  Final   Culture >=100,000 COLONIES/mL ESCHERICHIA COLI  Final   Report Status 01/14/2015 FINAL  Final   Organism ID, Bacteria ESCHERICHIA COLI  Final      Susceptibility   Escherichia coli - MIC*    AMPICILLIN <=2 SENSITIVE Sensitive     CEFAZOLIN <=4 SENSITIVE Sensitive     CEFTRIAXONE <=1 SENSITIVE Sensitive     CIPROFLOXACIN >=4 RESISTANT Resistant     GENTAMICIN <=1  SENSITIVE Sensitive     IMIPENEM 0.5 SENSITIVE Sensitive     NITROFURANTOIN <=16 SENSITIVE Sensitive     TRIMETH/SULFA >=320 RESISTANT Resistant     PIP/TAZO Value in next row Sensitive      SENSITIVE<=4    * >=100,000 COLONIES/mL ESCHERICHIA COLI  Culture, blood (routine x 2)     Status: None (Preliminary result)   Collection Time: 01/11/15 10:41 PM  Result Value Ref Range Status   Specimen Description BLOOD LEFT ASSIST CONTROL  Final   Special Requests BOTTLES DRAWN AEROBIC AND ANAEROBIC 4CC  Final   Culture  Setup Time   Final    GRAM NEGATIVE RODS AEROBIC BOTTLE ONLY CRITICAL VALUE NOTED.  VALUE IS CONSISTENT WITH PREVIOUSLY REPORTED AND CALLED VALUE.    Culture ESCHERICHIA COLI AEROBIC BOTTLE ONLY   Final   Report Status PENDING   Incomplete   Organism ID, Bacteria ESCHERICHIA COLI  Final      Susceptibility   Escherichia coli - MIC*    AMPICILLIN <=2 SENSITIVE Sensitive     CEFTAZIDIME <=1 SENSITIVE Sensitive     CEFAZOLIN <=4 SENSITIVE Sensitive     CEFTRIAXONE <=1 SENSITIVE Sensitive     CIPROFLOXACIN >=4 RESISTANT Resistant     GENTAMICIN <=1 SENSITIVE Sensitive     IMIPENEM <=0.25 SENSITIVE Sensitive     TRIMETH/SULFA >=320 RESISTANT Resistant     * ESCHERICHIA COLI  Culture, blood (routine x 2)     Status: None   Collection Time: 01/11/15 10:41 PM  Result Value Ref Range Status   Specimen Description BLOOD RIGHT ASSIST CONTROL  Final   Special Requests BOTTLES DRAWN AEROBIC AND ANAEROBIC 4CC  Final   Culture  Setup Time   Final    GRAM NEGATIVE RODS IN BOTH AEROBIC AND ANAEROBIC BOTTLES CRITICAL RESULT CALLED TO, READ BACK BY AND VERIFIED WITH: CHRIS BENNETT,RN 01/12/2015 AT 1356 BY JRS.    Culture   Final    ESCHERICHIA COLI IN BOTH AEROBIC AND ANAEROBIC BOTTLES    Report Status 01/14/2015 FINAL  Final   Organism ID, Bacteria ESCHERICHIA COLI  Final      Susceptibility   Escherichia coli - MIC*    AMPICILLIN <=2 SENSITIVE Sensitive     CEFTAZIDIME <=1 SENSITIVE Sensitive     CEFAZOLIN <=4 SENSITIVE Sensitive     CEFTRIAXONE <=1 SENSITIVE Sensitive     CIPROFLOXACIN >=4 RESISTANT Resistant     GENTAMICIN <=1 SENSITIVE Sensitive     IMIPENEM <=0.25 SENSITIVE Sensitive     TRIMETH/SULFA >=320 RESISTANT Resistant     PIP/TAZO Value in next row Sensitive      SENSITIVE<=4    * ESCHERICHIA COLI  MRSA PCR Screening     Status: Abnormal   Collection Time: 01/12/15  7:11 AM  Result Value Ref Range Status   MRSA by PCR POSITIVE (A) NEGATIVE Final    Comment:        The GeneXpert MRSA Assay (FDA approved for NASAL specimens only), is one component of a comprehensive MRSA colonization surveillance program. It is not intended to diagnose MRSA infection nor to guide or monitor treatment for MRSA  infections. CRITICAL RESULT CALLED TO, READ BACK BY AND VERIFIED WITH: [name] HODGE AT 0946 ON 01/12/15 BY QSD     Coagulation Studies: No results for input(s): LABPROT, INR in the last 72 hours.  Urinalysis: No results for input(s): COLORURINE, LABSPEC, PHURINE, GLUCOSEU, HGBUR, BILIRUBINUR, KETONESUR, PROTEINUR, UROBILINOGEN, NITRITE, LEUKOCYTESUR in the last 72 hours.  Invalid  input(s): APPERANCEUR    Imaging: US Renal  01/14/2015   CLINICAL DATA:  Followup hydronephrosis status post Foley catheter placement.  EXAM: RENAL / URINARY TRACT ULTRASOUND COMPLETE  COMPARISON:  01/12/2015  FINDINGS: Right Kidney:  Length: 11.5 cm. Normal renal cortical thickness and echogenicity without focal lesions. Persistent moderate hydronephrosis. No definite renal calculi.  Left Kidney:  Length: 9.6 cm. Mild renal cortical thinning and probable scarring change. Persistent hydronephrosis.  Bladder:  Decompressed by a Foley catheter.  Other:  Small amount of ascites.  IMPRESSION: Persistent bilateral hydronephrosis.   Electronically Signed   By: Marijo Sanes M.D.   On: 01/14/2015 10:03     Medications:   . sodium chloride 50 mL/hr at 01/14/15 1931   . cefTRIAXone (ROCEPHIN)  IV  2 g Intravenous Q24H  . Chlorhexidine Gluconate Cloth  6 each Topical Q0600  . insulin aspart  0-15 Units Subcutaneous TID AC & HS  . insulin glargine  18 Units Subcutaneous QHS  . latanoprost  1 drop Both Eyes QHS  . levothyroxine  75 mcg Oral QAC breakfast  . mupirocin ointment  1 application Nasal BID  . pantoprazole  40 mg Oral QAC breakfast  . pregabalin  25 mg Oral BID  . sodium chloride  3 mL Intravenous Q12H   acetaminophen **OR** acetaminophen, LORazepam, ondansetron **OR** ondansetron (ZOFRAN) IV  Assessment/ Plan:  80 y.o. female with a PMHx of diabetes mellitus type II, hyperlipidemia, atropic dermatitis, history of cholecystitis, depression, anxiety, hypertension, CKD stage III baseline egfr 84, who was  admitted to [facility] on 01/11/2015 for evaluation of altered mental status.   1. Acute renal failure/CKD stage III baseline egfr of 58. Likely combination of UTI/sepsis causing ATN, hydronephrosis  - Bilateral hydronephrosis noted on renal ultrasound. - Urine output 1200 cc. Serum creatinine improved slightly to 3.85.  - Foley in place. Continue to monitor.   2. UTI, sepsis: Had TNTC WBCs in the urine, E . Coli growing in the blood,  - IV Rocephin  3. Anemia unspecified D64.9:  - Received blood transfusion this admission  4. DM-2/ CKD. Obtain HbA1c and Urine Pr/Cr ratio - Poorly controlled - Hemoglobin A1c 10.6  5. Contact precautions for MRSA    LOS: 4 Derry Arbogast 8/23/201611:00 AM

## 2015-01-15 NOTE — Progress Notes (Signed)
Inpatient Diabetes Program Recommendations  AACE/ADA: New Consensus Statement on Inpatient Glycemic Control (2013)  Target Ranges:  Prepandial:   less than 140 mg/dL      [facility] postprandial:   less than 180 mg/dL (1-2 hours)      Critically ill patients:  140 - 180 mg/dL    Diabetes history: Type 2 diabetes Outpatient Diabetes medications: Lantus 25 units q HS, Humalog correction, Januvia 50 mg daily Current orders for Inpatient glycemic control:  Lantus 10 units q HS, Novolog moderate tid with meals and HS  Large amounts of Novolog required in the past two days-fasting blood sugar today 318 mg/dl.  Consider increasing Lantus to 25 units q HS (this is home dose) and increase frequency of Novolog correction to q 4 hours.  Susette Racer, RN, BA, MHA, CDE Diabetes Coordinator Inpatient Diabetes Program  [phone] (Team Pager) [phone] [facility] [place] Office) 01/15/2015 8:42 AM

## 2015-01-15 NOTE — Plan of Care (Signed)
Individualization of Care Hx of hypothyroidism, anxiety, DM2, HLD, HTN, gallstones, depression, anemia, weakness, kidney disease and hearing impairment.   Problem: Discharge Progression Outcomes Goal: Other Discharge Outcomes/Goals Outcome: Progressing Plan of care progress to goals: 1.  Discharge plan:  Patient is currently from [facility].  Family requesting that she be transferred to [facility]; social worker/case management aware. 2.  Pain:  Patient without pain this shift based on PAINAD pain scale.  Resting comfortably throughout shift. 3.  Hemodynamically stable:  Creatine continues to be elevated.  Patient afebrile and VSS.  BP 126/53 mmHg  Pulse 76  Temp(Src) 98.7 F (37.1 C) (Oral)  Resp 17  Ht  (1.6 m)  Wt 131 lb 4.8 oz (59.557 kg)  BMI 23.26 kg/m2  SpO2 99% 4.  Complications:  Discomfort noted with perineum care, foley continues to drain dark yellow cloudy urine with small blood clots and sediment noted.  Abdomen distended and patient expresses discomfort during palpation.  Patient has  generalized weakness.  Patient very hearing impaired - can read lips. 5. Diet:  Tolerating diet- tolerating diet, no c/o nausea or vomiting.Meds crushed in applesauce. 6.  Activity:  Bedrest with repositioning.

## 2015-01-15 NOTE — Progress Notes (Signed)
Patient ID: Gina Andersen, female   DOB: 1934/06/14, 80 y.o.   MRN: [id number] [facility] PROGRESS NOTE  PCP: Nelda Bucks., MD  HPI/Subjective: Patient with some confusion but does answer questions. She does read lips. Pain when palpating over the abdomen.  Objective: Filed Vitals:   01/15/15 0543  BP: 119/53  Pulse: 83  Temp: 99.6 F (37.6 C)  Resp: 16    Filed Weights   01/12/15 0500 01/13/15 0423 01/14/15 0427  Weight: 56.7 kg (125 lb) 57.153 kg (126 lb) 59.557 kg (131 lb 4.8 oz)    ROS: Review of Systems  Constitutional: Negative for fever and chills.  Eyes: Negative for blurred vision.  Respiratory: Negative for cough and shortness of breath.   Cardiovascular: Negative for chest pain.  Gastrointestinal: Positive for abdominal pain. Negative for nausea, vomiting, diarrhea and constipation.  Genitourinary: Negative for dysuria.  Musculoskeletal: Positive for myalgias and joint pain.  Neurological: Negative for dizziness and headaches.   Exam: Physical Exam  HENT:  Nose: No mucosal edema.  Mouth/Throat: No oropharyngeal exudate or posterior oropharyngeal edema.  Eyes: Conjunctivae, EOM and lids are normal. Pupils are equal, round, and reactive to light.  Neck: No JVD present. Carotid bruit is not present. No edema present. No thyroid mass and no thyromegaly present.  Cardiovascular: S1 normal and S2 normal.  Exam reveals no gallop.   No murmur heard. Pulses:      Dorsalis pedis pulses are 2+ on the right side, and 2+ on the left side.  Respiratory: No respiratory distress. She has no wheezes. She has no rhonchi. She has no rales.  GI: Soft. Bowel sounds are normal. There is tenderness in the suprapubic area.  Musculoskeletal:       Right ankle: She exhibits no swelling.       Left ankle: She exhibits no swelling.  Lymphadenopathy:    She has no cervical adenopathy.  Neurological: She is alert. No cranial nerve deficit.  Skin: Skin is warm.  No rash noted. Nails show no clubbing.  Psychiatric: She has a normal mood and affect.    Data Reviewed: Basic Metabolic Panel:  Recent Labs Lab 01/11/15 2011 01/12/15 0121 01/13/15 0425 01/14/15 0513 01/15/15 0445  NA 128* 129* 132* 134* 136  K 3.8 3.5 3.9 3.6 4.4  CL 96* 102 106 108 111  CO2 22 22 19* 19* 20*  GLUCOSE 431* 437* 332* 286* 363*  BUN 120* 98* 99* 84* 78*  CREATININE 4.61* 4.20* 4.23* 3.98* 3.85*  CALCIUM 8.7* 8.0* 7.9* 8.0* 8.0*   Liver Function Tests:  Recent Labs Lab 01/12/15 0121  AST 30  ALT 11*  ALKPHOS 150*  BILITOT 0.4  PROT 5.4*  ALBUMIN 1.8*   CBC:  Recent Labs Lab 01/11/15 2011 01/12/15 0121 01/13/15 0425 01/14/15 0513 01/15/15 0445  WBC 13.3* 10.0 8.7 10.9 9.3  NEUTROABS 10.8*  --   --   --   --   HGB 7.7* 7.4* 6.8* 9.4* 8.9*  HCT 23.0* 21.8* 20.2* 26.8* 26.1*  MCV 92.1 92.3 91.8 88.9 89.8  PLT 192 174 174 186 184    CBG:  Recent Labs Lab 01/14/15 0809 01/14/15 1226 01/14/15 1711 01/14/15 2151 01/15/15 0720  GLUCAP 144* 259* 295* 344* 318*    Recent Results (from the past 240 hour(s))  Urine culture     Status: None   Collection Time: 01/11/15  8:47 PM  Result Value Ref Range Status   Specimen Description URINE, CLEAN CATCH  Final   Special Requests NONE  Final   Culture >=100,000 COLONIES/mL ESCHERICHIA COLI  Final   Report Status 01/14/2015 FINAL  Final   Organism ID, Bacteria ESCHERICHIA COLI  Final      Susceptibility   Escherichia coli - MIC*    AMPICILLIN <=2 SENSITIVE Sensitive     CEFAZOLIN <=4 SENSITIVE Sensitive     CEFTRIAXONE <=1 SENSITIVE Sensitive     CIPROFLOXACIN >=4 RESISTANT Resistant     GENTAMICIN <=1 SENSITIVE Sensitive     IMIPENEM 0.5 SENSITIVE Sensitive     NITROFURANTOIN <=16 SENSITIVE Sensitive     TRIMETH/SULFA >=320 RESISTANT Resistant     PIP/TAZO Value in next row Sensitive      SENSITIVE<=4    * >=100,000 COLONIES/mL ESCHERICHIA COLI  Culture, blood (routine x 2)     Status:  None (Preliminary result)   Collection Time: 01/11/15 10:41 PM  Result Value Ref Range Status   Specimen Description BLOOD LEFT ASSIST CONTROL  Final   Special Requests BOTTLES DRAWN AEROBIC AND ANAEROBIC 4CC  Final   Culture  Setup Time   Final    GRAM NEGATIVE RODS AEROBIC BOTTLE ONLY CRITICAL VALUE NOTED.  VALUE IS CONSISTENT WITH PREVIOUSLY REPORTED AND CALLED VALUE.    Culture ESCHERICHIA COLI AEROBIC BOTTLE ONLY   Final   Report Status PENDING  Incomplete   Organism ID, Bacteria ESCHERICHIA COLI  Final      Susceptibility   Escherichia coli - MIC*    AMPICILLIN <=2 SENSITIVE Sensitive     CEFTAZIDIME <=1 SENSITIVE Sensitive     CEFAZOLIN <=4 SENSITIVE Sensitive     CEFTRIAXONE <=1 SENSITIVE Sensitive     CIPROFLOXACIN >=4 RESISTANT Resistant     GENTAMICIN <=1 SENSITIVE Sensitive     IMIPENEM <=0.25 SENSITIVE Sensitive     TRIMETH/SULFA >=320 RESISTANT Resistant     * ESCHERICHIA COLI  Culture, blood (routine x 2)     Status: None   Collection Time: 01/11/15 10:41 PM  Result Value Ref Range Status   Specimen Description BLOOD RIGHT ASSIST CONTROL  Final   Special Requests BOTTLES DRAWN AEROBIC AND ANAEROBIC 4CC  Final   Culture  Setup Time   Final    GRAM NEGATIVE RODS IN BOTH AEROBIC AND ANAEROBIC BOTTLES CRITICAL RESULT CALLED TO, READ BACK BY AND VERIFIED WITH: CHRIS BENNETT,RN 01/12/2015 AT 1356 BY JRS.    Culture   Final    ESCHERICHIA COLI IN BOTH AEROBIC AND ANAEROBIC BOTTLES    Report Status 01/14/2015 FINAL  Final   Organism ID, Bacteria ESCHERICHIA COLI  Final      Susceptibility   Escherichia coli - MIC*    AMPICILLIN <=2 SENSITIVE Sensitive     CEFTAZIDIME <=1 SENSITIVE Sensitive     CEFAZOLIN <=4 SENSITIVE Sensitive     CEFTRIAXONE <=1 SENSITIVE Sensitive     CIPROFLOXACIN >=4 RESISTANT Resistant     GENTAMICIN <=1 SENSITIVE Sensitive     IMIPENEM <=0.25 SENSITIVE Sensitive     TRIMETH/SULFA >=320 RESISTANT Resistant     PIP/TAZO Value in next row  Sensitive      SENSITIVE<=4    * ESCHERICHIA COLI  MRSA PCR Screening     Status: Abnormal   Collection Time: 01/12/15  7:11 AM  Result Value Ref Range Status   MRSA by PCR POSITIVE (A) NEGATIVE Final    Comment:        The GeneXpert MRSA Assay (FDA approved for NASAL specimens only), is one component  of a comprehensive MRSA colonization surveillance program. It is not intended to diagnose MRSA infection nor to guide or monitor treatment for MRSA infections. CRITICAL RESULT CALLED TO, READ BACK BY AND VERIFIED WITH: [name] HODGE AT 1610 ON 01/12/15 BY QSD      Studies: US Renal  01/14/2015   CLINICAL DATA:  Followup hydronephrosis status post Foley catheter placement.  EXAM: RENAL / URINARY TRACT ULTRASOUND COMPLETE  COMPARISON:  01/12/2015  FINDINGS: Right Kidney:  Length: 11.5 cm. Normal renal cortical thickness and echogenicity without focal lesions. Persistent moderate hydronephrosis. No definite renal calculi.  Left Kidney:  Length: 9.6 cm. Mild renal cortical thinning and probable scarring change. Persistent hydronephrosis.  Bladder:  Decompressed by a Foley catheter.  Other:  Small amount of ascites.  IMPRESSION: Persistent bilateral hydronephrosis.   Electronically Signed   By: Rudie Meyer M.D.   On: 01/14/2015 10:03    Scheduled Meds: . cefTRIAXone (ROCEPHIN)  IV  2 g Intravenous Q24H  . Chlorhexidine Gluconate Cloth  6 each Topical Q0600  . insulin aspart  0-15 Units Subcutaneous TID AC & HS  . insulin glargine  18 Units Subcutaneous QHS  . latanoprost  1 drop Both Eyes QHS  . levothyroxine  75 mcg Oral QAC breakfast  . mupirocin ointment  1 application Nasal BID  . pantoprazole  40 mg Oral QAC breakfast  . pregabalin  25 mg Oral BID  . sodium chloride  3 mL Intravenous Q12H   Continuous Infusions: . sodium chloride 50 mL/hr at 01/14/15 1931    Assessment/Plan:  1. Clinical sepsis, acute cystitis with hematuria. Escherichia coli growing in both blood and urine  cultures. Antibiotics changed to Rocephin yesterday. Will need 2 weeks worth of antibiotics. 2. Bilateral hydronephrosis, possible bladder mass- likely will end up needing a cystoscopy by urology at some point. 3. Acute renal failure- GFR still very poor. Creatinine very slow to improve. On gentle IV fluid hydration 4. Hyponatremia- improved with IV fluid hydration 5. Diabetes uncontrolled- I'm hesitant with the acute renal failure to go higher on the Lantus insulin. I increased it up to 18 units for tonight. 6. Hypothyroidism unspecified- TSH high. Increase levothyroxine to 75 g daily. 7. Gastroesophageal reflux disease without esophagitis continue Protonix.  Code Status:     Code Status Orders        Start     Ordered   01/12/15 0046  Full code   Continuous     01/12/15 0045    Advance Directive Documentation        Most Recent Value   Type of Advance Directive  Healthcare Power of Attorney   Pre-existing out of facility DNR order (yellow form or pink MOST form)     "MOST" Form in Place?       Family Communication: Spoke with daughter [name] on the phone (confirmed full CODE STATUS). Disposition Plan: [facility] once better.  Consultants:  Nephrology  Urology  Antibiotics:  Rocephin  Time spent: 25 minutes  Alford Highland  [facility]

## 2015-01-15 NOTE — Progress Notes (Signed)
Patient's creatinine is improving comparison of her ultrasound reveals decreasing hydronephrosis. I do not think she is a good candidate for bilateral stents. [place] plan of care at this point should be for the patient to become more stabilized and in the future as an outpatient find a reason for her urinary retention. I do not plan to do bilateral retrogrades and stents on this patient this time. We happy to see her in follow-up in the office after a week or 2 of Foley drainage. If she goes back to nursing home I would recommend removing the catheter intermittently cathing patient with a catheter every the 6 hours. That should help take care of the urinary retention and the hydronephrosis from retention.

## 2015-01-15 NOTE — Care Management Note (Signed)
Case Management Note  Patient Details  Name: Gina Andersen MRN: [id number] Date of Birth: [birth year]  Subjective/Objective:  Dr Hilton Sinclair reports that [name] is not yet ready for discharge per Creatinine=3.85 today, and pending Urology recommendation about a possible bladder mass. Social Work is following per [name] is from [facility] and her family is requesting discharge to [facility].                    Action/Plan:   Expected Discharge Date:  01/15/15               Expected Discharge Plan:     In-House Referral:     Discharge planning Services     Post Acute Care Choice:    Choice offered to:     DME Arranged:    DME Agency:     HH Arranged:    HH Agency:     Status of Service:     Medicare Important Message Given:  Yes-second notification given Date Medicare IM Given:    Medicare IM give by:    Date Additional Medicare IM Given:    Additional Medicare Important Message give by:     If discussed at Long Length of Stay Meetings, dates discussed:    Additional Comments:  Phillipa Morden A, RN 01/15/2015, 10:55 AM

## 2015-01-15 NOTE — Plan of Care (Signed)
Problem: Discharge Progression Outcomes Goal: Activity appropriate for discharge plan Outcome: Progressing Plan to go to [facility] at discharge.

## 2015-01-15 NOTE — Progress Notes (Signed)
Urology Consult Follow Up  Subjective: Patient is still lethargic and unable to answer questions correctly.   She complains of pain wherever she is touched on her body.  She continues to have good urine output and her creatinine has improved from 4.23 upon admission to 3.85 today.    Anti-infective's: Anti-infectives    Start     Dose/Rate Route Frequency Ordered Stop   01/15/15 1130  cefTRIAXone (ROCEPHIN) 2 g in dextrose 5 % 50 mL IVPB     2 g 100 mL/hr over 30 Minutes Intravenous Every 24 hours 01/14/15 1253     01/14/15 1130  cefTRIAXone (ROCEPHIN) 1 g in dextrose 5 % 50 mL IVPB  Status:  Discontinued     1 g 100 mL/hr over 30 Minutes Intravenous Every 24 hours 01/14/15 1123 01/14/15 1253   01/13/15 0800  vancomycin (VANCOCIN) 500 mg in sodium chloride 0.9 % 100 mL IVPB  Status:  Discontinued     500 mg 100 mL/hr over 60 Minutes Intravenous Every 48 hours 01/12/15 0925 01/12/15 1400   01/12/15 1200  piperacillin-tazobactam (ZOSYN) IVPB 3.375 g  Status:  Discontinued     3.375 g 12.5 mL/hr over 240 Minutes Intravenous Every 12 hours 01/12/15 0052 01/14/15 1123   01/12/15 0024  piperacillin-tazobactam (ZOSYN) 3.375 (3-0.375) G injection    Comments:  FLORES, LUIS: cabinet override      01/12/15 0024 01/12/15 1229   01/11/15 2230  cefTRIAXone (ROCEPHIN) 1 g in dextrose 5 % 50 mL IVPB  Status:  Discontinued     1 g 100 mL/hr over 30 Minutes Intravenous  Once 01/11/15 2222 01/11/15 2223   01/11/15 2230  vancomycin (VANCOCIN) IVPB 1000 mg/200 mL premix     1,000 mg 200 mL/hr over 60 Minutes Intravenous  Once 01/11/15 2226 01/11/15 2341   01/11/15 2230  piperacillin-tazobactam (ZOSYN) IVPB 3.375 g     3.375 g 100 mL/hr over 30 Minutes Intravenous  Once 01/11/15 2226 01/12/15 0054      Current Facility-Administered Medications  Medication Dose Route Frequency Provider Last Rate Last Dose  . 0.9 %  sodium chloride infusion   Intravenous Continuous Alford Highland, MD 50 mL/hr at  01/14/15 1931    . acetaminophen (TYLENOL) tablet 650 mg  650 mg Oral Q6H PRN Crissie Figures, MD   650 mg at 01/14/15 1025   Or  . acetaminophen (TYLENOL) suppository 650 mg  650 mg Rectal Q6H PRN Crissie Figures, MD      . cefTRIAXone (ROCEPHIN) 2 g in dextrose 5 % 50 mL IVPB  2 g Intravenous Q24H Alford Highland, MD      . Chlorhexidine Gluconate Cloth 2 % PADS 6 each  6 each Topical Q0600 Alford Highland, MD   6 each at 01/15/15 [phone]  . insulin aspart (novoLOG) injection 0-15 Units  0-15 Units Subcutaneous TID AC & HS Oralia Manis, MD   11 Units at 01/14/15 2213  . insulin glargine (LANTUS) injection 18 Units  18 Units Subcutaneous QHS Alford Highland, MD      . latanoprost (XALATAN) 0.005 % ophthalmic solution 1 drop  1 drop Both Eyes QHS Crissie Figures, MD   1 drop at 01/14/15 2213  . levothyroxine (SYNTHROID, LEVOTHROID) tablet 75 mcg  75 mcg Oral QAC breakfast Alford Highland, MD      . LORazepam (ATIVAN) tablet 0.5 mg  0.5 mg Oral Q6H PRN Crissie Figures, MD   0.5 mg at 01/12/15 0142  . mupirocin ointment (  BACTROBAN) 2 % 1 application  1 application Nasal BID Alford Highland, MD   1 application at 01/14/15 2327  . ondansetron (ZOFRAN) tablet 4 mg  4 mg Oral Q6H PRN Crissie Figures, MD       Or  . ondansetron [facility]) injection 4 mg  4 mg Intravenous Q6H PRN Crissie Figures, MD      . pantoprazole (PROTONIX) EC tablet 40 mg  40 mg Oral QAC breakfast Crissie Figures, MD   40 mg at 01/14/15 1025  . pregabalin (LYRICA) capsule 25 mg  25 mg Oral BID Crissie Figures, MD   25 mg at 01/14/15 2213  . sodium chloride 0.9 % injection 3 mL  3 mL Intravenous Q12H Crissie Figures, MD   3 mL at 01/13/15 2253     Objective: Vital signs in last 24 hours: Temp:  [97.4 F (36.3 C)-99.6 F (37.6 C)] 99.6 F (37.6 C) (08/23 0543) Pulse Rate:  [76-83] 83 (08/23 0543) Resp:  [16-19] 16 (08/23 0543) BP: (113-126)/(50-53) 119/53 mmHg (08/23 0543) SpO2:  [96 %-99 %] 96 % (08/23  0543)  Intake/Output from previous day: 08/22 0701 - 08/23 0700 In: 652.5 [I.V.:652.5] Out: 1201 [Urine:1200; Stool:1] Intake/Output this shift:     Physical Exam Constitutional: lethargic HEENT: [place] AT, moist mucus membranes. Trachea midline, no masses. Cardiovascular: No clubbing, cyanosis, or edema. Respiratory: Normal respiratory effort, no increased work of breathing. GI: Abdomen is soft, tender, distended, no abdominal masses GU: No CVA tenderness.  No bladder fullness or masses.  Skin: No rashes, bruises or suspicious lesions. Lymph: No cervical or inguinal adenopathy. Neurologic: Grossly intact, no focal deficits, moving all 4 extremities. Psychiatric: Dementia.  Lab Results:   Recent Labs  01/14/15 0513 01/15/15 0445  WBC 10.9 9.3  HGB 9.4* 8.9*  HCT 26.8* 26.1*  PLT 186 184   BMET  Recent Labs  01/14/15 0513 01/15/15 0445  NA 134* 136  K 3.6 4.4  CL 108 111  CO2 19* 20*  GLUCOSE 286* 363*  BUN 84* 78*  CREATININE 3.98* 3.85*  CALCIUM 8.0* 8.0*   PT/INR No results for input(s): LABPROT, INR in the last 72 hours. ABG No results for input(s): PHART, HCO3 in the last 72 hours.  Invalid input(s): PCO2, PO2  Studies/Results: US Renal  01/14/2015   CLINICAL DATA:  Followup hydronephrosis status post Foley catheter placement.  EXAM: RENAL / URINARY TRACT ULTRASOUND COMPLETE  COMPARISON:  01/12/2015  FINDINGS: Right Kidney:  Length: 11.5 cm. Normal renal cortical thickness and echogenicity without focal lesions. Persistent moderate hydronephrosis. No definite renal calculi.  Left Kidney:  Length: 9.6 cm. Mild renal cortical thinning and probable scarring change. Persistent hydronephrosis.  Bladder:  Decompressed by a Foley catheter.  Other:  Small amount of ascites.  IMPRESSION: Persistent bilateral hydronephrosis.   Electronically Signed   By: Rudie Meyer M.D.   On: 01/14/2015 10:03     Assessment: Patient is a 80 year old white female who was  admitted for ARI, change in mental status and UTI with sepsis.   Patient was found to have bilateral hydronephrosis and foley catheter has been placed for decompression.  Since then, her creatinine and urinary output have been improving.  She complains of pain when you palpate her abdomen, but she also cries out in pain when you touch her leg or arm.  She did have a small amount of ascites on renal ultrasound.  Her repeated renal ultrasound noted persistent bilateral hydronephrosis,  but when compared with the renal ultrasound performed on admission, there is some mild improvement.  Blood and urine culture results and sensitivities are now available and they are positive for E. Coli.   Plan: Continue with foley drainage.  Blood and urine culture results and sensitivities are now available and they are positive for E. Coli.  Adjust antibiotics as appropriate.  Dr. Edwyna Shell to see the patient this afternoon.    CC:     LOS: 4 days    [facility] 01/15/2015

## 2015-01-15 NOTE — Plan of Care (Signed)
Problem: Discharge Progression Outcomes Goal: Other Discharge Outcomes/Goals Outcome: Progressing Pt slept most of shift, discussed treatment plan with rounding physician. Code status remains full, pt is alert and oriented to self only. Pt is going to be discharge to [facility] when stable. Contacted the diabetes coordinator regarding elevated blood sugars and she will discuss with physican. VSS and labs improving. Foley intact and draining a straw color urine with small amounts of sediment. Nephrology and Urology consulted ordered and modified.

## 2015-01-16 ENCOUNTER — Inpatient Hospital Stay: Payer: Medicare Other

## 2015-01-16 LAB — CULTURE, BLOOD (ROUTINE X 2)

## 2015-01-16 LAB — BASIC METABOLIC PANEL
ANION GAP: 6 (ref 5–15)
BUN: 68 mg/dL — AB (ref 6–20)
CALCIUM: 8.2 mg/dL — AB (ref 8.9–10.3)
CO2: 18 mmol/L — ABNORMAL LOW (ref 22–32)
Chloride: 112 mmol/L — ABNORMAL HIGH (ref 101–111)
Creatinine, Ser: 3.35 mg/dL — ABNORMAL HIGH (ref 0.44–1.00)
GFR calc Af Amer: 14 mL/min — ABNORMAL LOW (ref >60)
GFR, EST NON AFRICAN AMERICAN: 12 mL/min — AB (ref >60)
GLUCOSE: 399 mg/dL — AB (ref 65–99)
Potassium: 4.2 mmol/L (ref 3.5–5.1)
Sodium: 136 mmol/L (ref 135–145)

## 2015-01-16 LAB — GLUCOSE, CAPILLARY
GLUCOSE-CAPILLARY: 342 mg/dL — AB (ref 65–99)
GLUCOSE-CAPILLARY: 418 mg/dL — AB (ref 65–99)
Glucose-Capillary: 370 mg/dL — ABNORMAL HIGH (ref 65–99)
Glucose-Capillary: 381 mg/dL — ABNORMAL HIGH (ref 65–99)
Glucose-Capillary: 417 mg/dL — ABNORMAL HIGH (ref 65–99)

## 2015-01-16 LAB — HEMOGLOBIN: Hemoglobin: 8.5 g/dL — ABNORMAL LOW (ref 12.0–16.0)

## 2015-01-16 MED ORDER — POLYETHYLENE GLYCOL 3350 17 G PO PACK
17.0000 g | PACK | Freq: Every day | ORAL | Status: DC
  Filled 2015-01-16: qty 1

## 2015-01-16 MED ORDER — INSULIN ASPART 100 UNIT/ML ~~LOC~~ SOLN: 6.0000 [IU] | Freq: Three times a day (TID) | SUBCUTANEOUS | Status: DC

## 2015-01-16 MED ORDER — GUAIFENESIN 100 MG/5ML PO SOLN
5.0000 mL | ORAL | Status: DC | PRN
  Administered 2015-01-16: 100 mg via ORAL
  Filled 2015-01-16: qty 10

## 2015-01-16 MED ORDER — INSULIN GLARGINE 100 UNIT/ML ~~LOC~~ SOLN
25.0000 [IU] | Freq: Every day | SUBCUTANEOUS | Status: DC
  Administered 2015-01-16: 22:00:00 25 [IU] via SUBCUTANEOUS
  Filled 2015-01-16: qty 0.25

## 2015-01-16 MED ORDER — FERROUS SULFATE 325 (65 FE) MG PO TABS
325.0000 mg | ORAL_TABLET | Freq: Two times a day (BID) | ORAL | Status: DC
  Administered 2015-01-16 – 2015-01-18 (×4): 325 mg via ORAL
  Filled 2015-01-16 (×4): qty 1

## 2015-01-16 NOTE — Progress Notes (Signed)
Spoke with Dr. Renae Gloss about duplicate order for novolog.  MD gave verbal order to d/c 6 unit order of novolog.  Orson Ape, RN

## 2015-01-16 NOTE — Plan of Care (Signed)
Problem: Discharge Progression Outcomes Goal: Other Discharge Outcomes/Goals Outcome: Progressing  Individualization of Care Hx of hypothyroidism, anxiety, DM2, HLD, HTN, gallstones, depression, anemia, weakness, kidney disease and hearing impairment.  Plan of care progress to goals: 1. Discharge plan: Patient is currently from [facility]. Family requesting that she be transferred to [facility]; social worker/case management aware.  According to physician note, patient not ready for discharge due to elevated creatinine. 2. Pain: Patient without pain this shift based on PAINAD pain scale. Resting comfortably throughout shift. 3. Hemodynamically stable: Creatine continues to be elevated. Patient afebrile and VSS. BP 134/50 mmHg  Pulse 77  Temp(Src) 98.6 F (37 C) (Oral)  Resp 18  Ht  (1.6 m)  Wt 131 lb 4.8 oz (59.557 kg)  BMI 23.26 kg/m2  SpO2 97% 4. Complications: Discomfort noted with perineum care, foley continues to straw yellow cloudy urine with sediment. Abdomen distended and patient expresses discomfort during palpation. Patient has  generalized weakness. Patient very hearing impaired - can read lips.  Patient more aware this shift - talking to nurse about family and hospital care. 5. Diet: Tolerating diet- tolerating diet, no c/o nausea or vomiting.  Meds crushed in applesauce. 6. Activity: Bedrest with repositioning.

## 2015-01-16 NOTE — Plan of Care (Signed)
Problem: Discharge Progression Outcomes Goal: Other Discharge Outcomes/Goals Outcome: Progressing Plan of care progress to goal for: 1. Pain-no c/o pain this shift 2. Hemodynamically-             -VSS, pt remains afebrile this shift             -IV ABT continues per orders 3. Complications-pt's foley removed at 1756 per orders, pt continues to have high BS, SSI coverage given per orders 4. Diet-pt had good po intake this shift 5. Activity-pt is on bedrest

## 2015-01-16 NOTE — Progress Notes (Signed)
Pt's daughter called and added list of medications pt cannot take because it causes altered mental status in pt.  Medications have been added to allergy list.  Orson Ape, RN

## 2015-01-16 NOTE — Progress Notes (Signed)
Patient ID: Gina Andersen, female   DOB: Jan 17, 1935, 80 y.o.   MRN: [id number] [facility] PROGRESS NOTE  PCP: Nelda Bucks., MD  HPI/Subjective: Patient complaining of cough. States she's been coughing a lot. Does have some abdominal pain when palpating. Leg pain when palpating.  Objective: Filed Vitals:   01/16/15 0540  BP: 112/51  Pulse: 73  Temp: 98.8 F (37.1 C)  Resp: 28    Filed Weights   01/13/15 0423 01/14/15 0427 01/16/15 0540  Weight: 57.153 kg (126 lb) 59.557 kg (131 lb 4.8 oz) 61.735 kg (136 lb 1.6 oz)    ROS: Review of Systems  Constitutional: Negative for fever and chills.  Eyes: Negative for blurred vision.  Respiratory: Positive for cough. Negative for sputum production and shortness of breath.   Cardiovascular: Negative for chest pain.  Gastrointestinal: Positive for abdominal pain. Negative for nausea, vomiting, diarrhea and constipation.  Genitourinary: Negative for dysuria.  Musculoskeletal: Positive for myalgias and joint pain.  Neurological: Negative for dizziness and headaches.   Exam: Physical Exam  HENT:  Nose: No mucosal edema.  Mouth/Throat: No oropharyngeal exudate or posterior oropharyngeal edema.  Eyes: Conjunctivae, EOM and lids are normal. Pupils are equal, round, and reactive to light.  Neck: No JVD present. Carotid bruit is not present. No edema present. No thyroid mass and no thyromegaly present.  Cardiovascular: S1 normal and S2 normal.  Exam reveals no gallop.   No murmur heard. Pulses:      Dorsalis pedis pulses are 2+ on the right side, and 2+ on the left side.  Respiratory: No respiratory distress. She has no wheezes. She has no rhonchi. She has no rales.  GI: Soft. Bowel sounds are normal. There is tenderness in the suprapubic area.  Musculoskeletal:       Right ankle: She exhibits no swelling.       Left ankle: She exhibits no swelling.  Lymphadenopathy:    She has no cervical adenopathy.  Neurological:  She is alert. No cranial nerve deficit.  Skin: Skin is warm. No rash noted. Nails show no clubbing.  Psychiatric: She has a normal mood and affect.    Data Reviewed: Basic Metabolic Panel:  Recent Labs Lab 01/12/15 0121 01/13/15 0425 01/14/15 0513 01/15/15 0445 01/16/15 0441  NA 129* 132* 134* 136 136  K 3.5 3.9 3.6 4.4 4.2  CL 102 106 108 111 112*  CO2 22 19* 19* 20* 18*  GLUCOSE 437* 332* 286* 363* 399*  BUN 98* 99* 84* 78* 68*  CREATININE 4.20* 4.23* 3.98* 3.85* 3.35*  CALCIUM 8.0* 7.9* 8.0* 8.0* 8.2*   Liver Function Tests:  Recent Labs Lab 01/12/15 0121  AST 30  ALT 11*  ALKPHOS 150*  BILITOT 0.4  PROT 5.4*  ALBUMIN 1.8*   CBC:  Recent Labs Lab 01/11/15 2011 01/12/15 0121 01/13/15 0425 01/14/15 0513 01/15/15 0445 01/16/15 0441  WBC 13.3* 10.0 8.7 10.9 9.3  --   NEUTROABS 10.8*  --   --   --   --   --   HGB 7.7* 7.4* 6.8* 9.4* 8.9* 8.5*  HCT 23.0* 21.8* 20.2* 26.8* 26.1*  --   MCV 92.1 92.3 91.8 88.9 89.8  --   PLT 192 174 174 186 184  --     CBG:  Recent Labs Lab 01/15/15 0720 01/15/15 1100 01/15/15 1642 01/15/15 2203 01/16/15 0725  GLUCAP 318* 397* 336* 420* 342*    Scheduled Meds: . cefTRIAXone (ROCEPHIN)  IV  2 g Intravenous  Q24H  . Chlorhexidine Gluconate Cloth  6 each Topical Q0600  . ferrous sulfate  325 mg Oral BID WC  . insulin aspart  0-15 Units Subcutaneous TID AC & HS  . insulin aspart  6 Units Subcutaneous TID WC  . insulin glargine  25 Units Subcutaneous QHS  . latanoprost  1 drop Both Eyes QHS  . levothyroxine  75 mcg Oral QAC breakfast  . mupirocin ointment  1 application Nasal BID  . pantoprazole  40 mg Oral QAC breakfast  . pregabalin  25 mg Oral BID  . sodium chloride  3 mL Intravenous Q12H    Assessment/Plan:  1. Clinical sepsis, acute cystitis with hematuria. Escherichia coli growing in both blood and urine cultures. Antibiotics changed to Rocephin. Will need 2 weeks worth of antibiotics, I will switch to oral  Keflex upon discharge  2. Bilateral hydronephrosis, possible bladder mass- likely will end up needing a cystoscopy by urology at some point. Urology once the follow-up as outpatient. They recommend DC the Foley and catheterizations every 6 hours.  3. Acute renal failure- GFR still very poor. Creatinine very slow to improve. We'll check creatinine tomorrow morning. If this continues to improve I may be able to send her out to rehabilitation tomorrow  4. Hyponatremia- improved with IV fluid hydration.  5. Diabetes uncontrolled- Lantus increased to 25 units subcutaneous injection every HS and NovoLog 6 units 3 times a day  6. Hypothyroidism unspecified- TSH high. Increased levothyroxine to 75 g daily. 7. Gastroesophageal reflux disease without esophagitis continue Protonix. 8. Cough- we'll get a chest x-ray to see if fluid overload. Start Robitussin. IV fluids held. 9. Anemia- patient responded to one unit of blood. Start ferrous sulfate.  Code Status:     Code Status Orders        Start     Ordered   01/12/15 0046  Full code   Continuous     01/12/15 0045    Advance Directive Documentation        Most Recent Value   Type of Advance Directive  Healthcare Power of Attorney   Pre-existing out of facility DNR order (yellow form or pink MOST form)     "MOST" Form in Place?       Family Communication: Spoke with daughter [name]. Disposition Plan: [facility] once better.  Consultants:  Nephrology  Urology  Antibiotics:  Rocephin  Time spent: 20 minutes  Alford Highland  [facility]

## 2015-01-16 NOTE — Clinical Social Work Note (Signed)
CSW asked by Lead CSW to fax patient's referral to [facility] for review as family has requested to see if [facility] will take patient. FL2 updated and referral sent to [facility] for review. York Spaniel MSW,LCSW [phone]

## 2015-01-16 NOTE — Progress Notes (Signed)
Urology Consult Follow Up  Subjective: Patient is more responsive today.  She is able to answer questions more appropriately.  She still complains of pain when palpating her abdomen or squeezing her legs.    Anti-infective's: Anti-infectives    Start     Dose/Rate Route Frequency Ordered Stop   01/15/15 1130  cefTRIAXone (ROCEPHIN) 2 g in dextrose 5 % 50 mL IVPB     2 g 100 mL/hr over 30 Minutes Intravenous Every 24 hours 01/14/15 1253     01/14/15 1130  cefTRIAXone (ROCEPHIN) 1 g in dextrose 5 % 50 mL IVPB  Status:  Discontinued     1 g 100 mL/hr over 30 Minutes Intravenous Every 24 hours 01/14/15 1123 01/14/15 1253   01/13/15 0800  vancomycin (VANCOCIN) 500 mg in sodium chloride 0.9 % 100 mL IVPB  Status:  Discontinued     500 mg 100 mL/hr over 60 Minutes Intravenous Every 48 hours 01/12/15 0925 01/12/15 1400   01/12/15 1200  piperacillin-tazobactam (ZOSYN) IVPB 3.375 g  Status:  Discontinued     3.375 g 12.5 mL/hr over 240 Minutes Intravenous Every 12 hours 01/12/15 0052 01/14/15 1123   01/12/15 0024  piperacillin-tazobactam (ZOSYN) 3.375 (3-0.375) G injection    Comments:  FLORES, LUIS: cabinet override      01/12/15 0024 01/12/15 1229   01/11/15 2230  cefTRIAXone (ROCEPHIN) 1 g in dextrose 5 % 50 mL IVPB  Status:  Discontinued     1 g 100 mL/hr over 30 Minutes Intravenous  Once 01/11/15 2222 01/11/15 2223   01/11/15 2230  vancomycin (VANCOCIN) IVPB 1000 mg/200 mL premix     1,000 mg 200 mL/hr over 60 Minutes Intravenous  Once 01/11/15 2226 01/11/15 2341   01/11/15 2230  piperacillin-tazobactam (ZOSYN) IVPB 3.375 g     3.375 g 100 mL/hr over 30 Minutes Intravenous  Once 01/11/15 2226 01/12/15 0054      Current Facility-Administered Medications  Medication Dose Route Frequency Provider Last Rate Last Dose  . acetaminophen (TYLENOL) tablet 650 mg  650 mg Oral Q6H PRN Crissie Figures, MD   650 mg at 01/14/15 1025   Or  . acetaminophen (TYLENOL) suppository 650 mg  650 mg  Rectal Q6H PRN Crissie Figures, MD      . cefTRIAXone (ROCEPHIN) 2 g in dextrose 5 % 50 mL IVPB  2 g Intravenous Q24H Alford Highland, MD   2 g at 01/15/15 0949  . Chlorhexidine Gluconate Cloth 2 % PADS 6 each  6 each Topical Q0600 Alford Highland, MD   6 each at 01/16/15 [phone]  . ferrous sulfate tablet 325 mg  325 mg Oral BID WC Alford Highland, MD      . insulin aspart (novoLOG) injection 0-15 Units  0-15 Units Subcutaneous TID AC & HS Oralia Manis, MD   11 Units at 01/16/15 [phone]  . insulin aspart (novoLOG) injection 6 Units  6 Units Subcutaneous TID WC Alford Highland, MD      . insulin glargine (LANTUS) injection 25 Units  25 Units Subcutaneous QHS Alford Highland, MD      . latanoprost (XALATAN) 0.005 % ophthalmic solution 1 drop  1 drop Both Eyes QHS Crissie Figures, MD   1 drop at 01/15/15 2319  . levothyroxine (SYNTHROID, LEVOTHROID) tablet 75 mcg  75 mcg Oral QAC breakfast Alford Highland, MD   75 mcg at 01/16/15 [phone]  . LORazepam (ATIVAN) tablet 0.5 mg  0.5 mg Oral Q6H PRN Crissie Figures, MD  0.5 mg at 01/12/15 0142  . mupirocin ointment (BACTROBAN) 2 % 1 application  1 application Nasal BID Alford Highland, MD   1 application at 01/15/15 2319  . ondansetron (ZOFRAN) tablet 4 mg  4 mg Oral Q6H PRN Crissie Figures, MD       Or  . ondansetron [facility]) injection 4 mg  4 mg Intravenous Q6H PRN Crissie Figures, MD      . pantoprazole (PROTONIX) EC tablet 40 mg  40 mg Oral QAC breakfast Crissie Figures, MD   40 mg at 01/16/15 1610  . pregabalin (LYRICA) capsule 25 mg  25 mg Oral BID Crissie Figures, MD   25 mg at 01/16/15 0910  . sodium chloride 0.9 % injection 3 mL  3 mL Intravenous Q12H Crissie Figures, MD   3 mL at 01/16/15 0910     Objective: Vital signs in last 24 hours: Temp:  [98.6 F (37 C)-98.8 F (37.1 C)] 98.8 F (37.1 C) (08/24 0540) Pulse Rate:  [73-77] 73 (08/24 0540) Resp:  [18-28] 28 (08/24 0540) BP: (112-134)/(50-51) 112/51 mmHg (08/24 0540) SpO2:  [95 %-97  %] 95 % (08/24 0540) Weight:  [136 lb 1.6 oz (61.735 kg)] 136 lb 1.6 oz (61.735 kg) (08/24 0540)  Intake/Output from previous day: 08/23 0701 - 08/24 0700 In: 240 [P.O.:240] Out: 1650 [Urine:1650] Intake/Output this shift:     Physical Exam Constitutional: More alert today. Answering simple questions appropriately. HEENT:  AT, moist mucus membranes. Trachea midline, no masses. Cardiovascular: No clubbing, cyanosis, or edema. Respiratory: Normal respiratory effort, no increased work of breathing. GI: Abdomen is soft, nontender, nondistended, no abdominal masses GU: No CVA tenderness.  Abdomen is tender with palpation. No bladder fullness or masses.  Skin: No rashes, bruises or suspicious lesions. Lymph: No cervical or inguinal adenopathy. Neurologic: Grossly intact, no focal deficits, moving all 4 extremities.   Lab Results:   Recent Labs  01/14/15 0513 01/15/15 0445 01/16/15 0441  WBC 10.9 9.3  --   HGB 9.4* 8.9* 8.5*  HCT 26.8* 26.1*  --   PLT 186 184  --    BMET  Recent Labs  01/15/15 0445 01/16/15 0441  NA 136 136  K 4.4 4.2  CL 111 112*  CO2 20* 18*  GLUCOSE 363* 399*  BUN 78* 68*  CREATININE 3.85* 3.35*  CALCIUM 8.0* 8.2*   PT/INR No results for input(s): LABPROT, INR in the last 72 hours. ABG No results for input(s): PHART, HCO3 in the last 72 hours.  Invalid input(s): PCO2, PO2  Studies/Results: No results found.   Assessment: Gina Andersen is a 80 year old white female who was admitted with ARI, UTI and bilateral hydronephrosis.   A Foley was placed for decompression and her creatinine continues to improve. It was 3.98 upon admission today it is 3.35. She does have urine cultures positive for Escherichia coli and blood cultures positive for Escherichia coli. Both which are sensitive to the Rocephin. Her mental status is improving. She was able to briefly converse and answer simple questions.    Plan: At this point it is not felt that any  emergent urological intervention is needed. We'll be happy to follow her as an outpatient.  Would recommend intermittent catheterization versus continuous Foley when patient is discharged to skilled nursing facility.  These contact us with any questions.     LOS: 5 days    [facility] 01/16/2015

## 2015-01-16 NOTE — Progress Notes (Signed)
Subjective:  Pt seen at bedside. Resting quietly. Answers minimal questions.moaning Cr about the same at 4.23.-> 3.98-> 3.85->3.35 UOP noted to be 1650 cc over the past 24 hours.  Still lethargic but arousable.   Objective:  Vital signs in last 24 hours:  Temp:  [97.1 F (36.2 C)-98.8 F (37.1 C)] 97.1 F (36.2 C) (08/24 1342) Pulse Rate:  [72-77] 72 (08/24 1342) Resp:  [18-28] 19 (08/24 1342) BP: (112-134)/(50-78) 118/78 mmHg (08/24 1342) SpO2:  [95 %-100 %] 100 % (08/24 1342) Weight:  [61.735 kg (136 lb 1.6 oz)] 61.735 kg (136 lb 1.6 oz) (08/24 0540)  Weight change:  Filed Weights   01/13/15 0423 01/14/15 0427 01/16/15 0540  Weight: 57.153 kg (126 lb) 59.557 kg (131 lb 4.8 oz) 61.735 kg (136 lb 1.6 oz)    Intake/Output: I/O last 3 completed shifts: In: 892.5 [P.O.:240; I.V.:652.5] Out: 2850 [Urine:2850]   Intake/Output this shift:     Physical Exam: General: NAD, resting in bed  Head: Normocephalic, atraumatic. Decreased hearing  Eyes: Anicteric  Neck: Supple, trachea midline  Lungs:  Clear to auscultation normal effort  Heart: Regular rate and rhythm S1S2  Abdomen:  Soft, mild diffuse tenderness, BS present  Extremities:  trace peripheral edema.  Neurologic: Lethargic but arousable, answers only a few questions   Skin: No acute  lesions    Foley in place     Basic Metabolic Panel:  Recent Labs Lab 01/12/15 0121 01/13/15 0425 01/14/15 0513 01/15/15 0445 01/16/15 0441  NA 129* 132* 134* 136 136  K 3.5 3.9 3.6 4.4 4.2  CL 102 106 108 111 112*  CO2 22 19* 19* 20* 18*  GLUCOSE 437* 332* 286* 363* 399*  BUN 98* 99* 84* 78* 68*  CREATININE 4.20* 4.23* 3.98* 3.85* 3.35*  CALCIUM 8.0* 7.9* 8.0* 8.0* 8.2*    Liver Function Tests:  Recent Labs Lab 01/12/15 0121  AST 30  ALT 11*  ALKPHOS 150*  BILITOT 0.4  PROT 5.4*  ALBUMIN 1.8*   No results for input(s): LIPASE, AMYLASE in the last 168 hours. No results for input(s): AMMONIA in the last 168  hours.  CBC:  Recent Labs Lab 01/11/15 2011 01/12/15 0121 01/13/15 0425 01/14/15 0513 01/15/15 0445 01/16/15 0441  WBC 13.3* 10.0 8.7 10.9 9.3  --   NEUTROABS 10.8*  --   --   --   --   --   HGB 7.7* 7.4* 6.8* 9.4* 8.9* 8.5*  HCT 23.0* 21.8* 20.2* 26.8* 26.1*  --   MCV 92.1 92.3 91.8 88.9 89.8  --   PLT 192 174 174 186 184  --     Cardiac Enzymes:  Recent Labs Lab 01/11/15 2011  TROPONINI 0.03    BNP: Invalid input(s): POCBNP  CBG:  Recent Labs Lab 01/15/15 1642 01/15/15 2203 01/16/15 0725 01/16/15 1058 01/16/15 1617  GLUCAP 336* 420* 342* 370* 381*    Microbiology: Results for orders placed or performed during the hospital encounter of 01/11/15  Urine culture     Status: None   Collection Time: 01/11/15  8:47 PM  Result Value Ref Range Status   Specimen Description URINE, CLEAN CATCH  Final   Special Requests NONE  Final   Culture >=100,000 COLONIES/mL ESCHERICHIA COLI  Final   Report Status 01/14/2015 FINAL  Final   Organism ID, Bacteria ESCHERICHIA COLI  Final      Susceptibility   Escherichia coli - MIC*    AMPICILLIN <=2 SENSITIVE Sensitive     CEFAZOLIN <=  4 SENSITIVE Sensitive     CEFTRIAXONE <=1 SENSITIVE Sensitive     CIPROFLOXACIN >=4 RESISTANT Resistant     GENTAMICIN <=1 SENSITIVE Sensitive     IMIPENEM 0.5 SENSITIVE Sensitive     NITROFURANTOIN <=16 SENSITIVE Sensitive     TRIMETH/SULFA >=320 RESISTANT Resistant     PIP/TAZO Value in next row Sensitive      SENSITIVE<=4    * >=100,000 COLONIES/mL ESCHERICHIA COLI  Culture, blood (routine x 2)     Status: None   Collection Time: 01/11/15 10:41 PM  Result Value Ref Range Status   Specimen Description BLOOD LEFT ASSIST CONTROL  Final   Special Requests BOTTLES DRAWN AEROBIC AND ANAEROBIC 4CC  Final   Culture  Setup Time   Final    GRAM NEGATIVE RODS AEROBIC BOTTLE ONLY CRITICAL VALUE NOTED.  VALUE IS CONSISTENT WITH PREVIOUSLY REPORTED AND CALLED VALUE.    Culture ESCHERICHIA  COLI AEROBIC BOTTLE ONLY   Final   Report Status 01/16/2015 FINAL  Final   Organism ID, Bacteria ESCHERICHIA COLI  Final      Susceptibility   Escherichia coli - MIC*    AMPICILLIN <=2 SENSITIVE Sensitive     CEFTAZIDIME <=1 SENSITIVE Sensitive     CEFAZOLIN <=4 SENSITIVE Sensitive     CEFTRIAXONE <=1 SENSITIVE Sensitive     CIPROFLOXACIN >=4 RESISTANT Resistant     GENTAMICIN <=1 SENSITIVE Sensitive     IMIPENEM <=0.25 SENSITIVE Sensitive     TRIMETH/SULFA >=320 RESISTANT Resistant     * ESCHERICHIA COLI  Culture, blood (routine x 2)     Status: None   Collection Time: 01/11/15 10:41 PM  Result Value Ref Range Status   Specimen Description BLOOD RIGHT ASSIST CONTROL  Final   Special Requests BOTTLES DRAWN AEROBIC AND ANAEROBIC 4CC  Final   Culture  Setup Time   Final    GRAM NEGATIVE RODS IN BOTH AEROBIC AND ANAEROBIC BOTTLES CRITICAL RESULT CALLED TO, READ BACK BY AND VERIFIED WITH: CHRIS BENNETT,RN 01/12/2015 AT 1356 BY JRS.    Culture   Final    ESCHERICHIA COLI IN BOTH AEROBIC AND ANAEROBIC BOTTLES    Report Status 01/14/2015 FINAL  Final   Organism ID, Bacteria ESCHERICHIA COLI  Final      Susceptibility   Escherichia coli - MIC*    AMPICILLIN <=2 SENSITIVE Sensitive     CEFTAZIDIME <=1 SENSITIVE Sensitive     CEFAZOLIN <=4 SENSITIVE Sensitive     CEFTRIAXONE <=1 SENSITIVE Sensitive     CIPROFLOXACIN >=4 RESISTANT Resistant     GENTAMICIN <=1 SENSITIVE Sensitive     IMIPENEM <=0.25 SENSITIVE Sensitive     TRIMETH/SULFA >=320 RESISTANT Resistant     PIP/TAZO Value in next row Sensitive      SENSITIVE<=4    * ESCHERICHIA COLI  MRSA PCR Screening     Status: Abnormal   Collection Time: 01/12/15  7:11 AM  Result Value Ref Range Status   MRSA by PCR POSITIVE (A) NEGATIVE Final    Comment:        The GeneXpert MRSA Assay (FDA approved for NASAL specimens only), is one component of a comprehensive MRSA colonization surveillance program. It is not intended to  diagnose MRSA infection nor to guide or monitor treatment for MRSA infections. CRITICAL RESULT CALLED TO, READ BACK BY AND VERIFIED WITH: [name] HODGE AT 0946 ON 01/12/15 BY QSD     Coagulation Studies: No results for input(s): LABPROT, INR in the last 72 hours.  Urinalysis: No results for input(s): COLORURINE, LABSPEC, PHURINE, GLUCOSEU, HGBUR, BILIRUBINUR, KETONESUR, PROTEINUR, UROBILINOGEN, NITRITE, LEUKOCYTESUR in the last 72 hours.  Invalid input(s): APPERANCEUR    Imaging: Dg Chest Port 1 View  01/16/2015   CLINICAL DATA:  Altered mental status and abnormal renal function with UTIs, cough  EXAM: PORTABLE CHEST - 1 VIEW  COMPARISON:  01/11/2015  FINDINGS: Cardiac shadow is stable. Lungs are well aerated bilaterally. No acute bony abnormality is seen.  IMPRESSION: No acute abnormality noted.   Electronically Signed   By: Inez Catalina M.D.   On: 01/16/2015 13:06     Medications:     . cefTRIAXone (ROCEPHIN)  IV  2 g Intravenous Q24H  . Chlorhexidine Gluconate Cloth  6 each Topical Q0600  . ferrous sulfate  325 mg Oral BID WC  . insulin aspart  0-15 Units Subcutaneous TID AC & HS  . insulin glargine  25 Units Subcutaneous QHS  . latanoprost  1 drop Both Eyes QHS  . levothyroxine  75 mcg Oral QAC breakfast  . mupirocin ointment  1 application Nasal BID  . pantoprazole  40 mg Oral QAC breakfast  . polyethylene glycol  17 g Oral Daily  . pregabalin  25 mg Oral BID  . sodium chloride  3 mL Intravenous Q12H   acetaminophen **OR** acetaminophen, guaiFENesin, LORazepam, ondansetron **OR** ondansetron (ZOFRAN) IV  Assessment/ Plan:  80 y.o. female with a PMHx of diabetes mellitus type II, hyperlipidemia, atropic dermatitis, history of cholecystitis, depression, anxiety, hypertension, CKD stage III baseline egfr 47, who was admitted to [facility] on 01/11/2015 for evaluation of altered mental status.   1. Acute renal failure/CKD stage III baseline egfr of 58. Likely combination of  UTI/sepsis causing ATN, hydronephrosis  - Bilateral hydronephrosis noted on renal ultrasound. - Urine output 1650 cc. Serum creatinine improved slightly to 3.35.  - Foley in place. Continue to monitor.   2. UTI, sepsis: Had TNTC WBCs in the urine, E . Coli growing in the blood,  - IV Rocephin  3. Anemia unspecified D64.9:  - Received blood transfusion this admission  4. DM-2/ CKD. Obtain HbA1c and Urine Pr/Cr ratio - Poorly controlled - Hemoglobin A1c 10.6  5. Contact precautions for MRSA    LOS: 5 Gina Andersen 8/24/20165:02 PM

## 2015-01-16 NOTE — Progress Notes (Signed)
Inpatient Diabetes Program Recommendations  AACE/ADA: New Consensus Statement on Inpatient Glycemic Control (2013)  Target Ranges:  Prepandial:   less than 140 mg/dL      Peak postprandial:   less than 180 mg/dL (1-2 hours)      Critically ill patients:  140 - 180 mg/dL  Results for KALEEA, PENNER (MRN [id number]) as of 01/16/2015 08:10  Ref. Range 01/15/2015 07:20 01/15/2015 11:00 01/15/2015 16:42 01/15/2015 22:03 01/16/2015 07:25  Glucose-Capillary Latest Ref Range: 65-99 mg/dL 409 (H) 811 (H) 914 (H) 420 (H) 342 (H)   Results for PEARLENE, TEAT (MRN [id number]) as of 01/16/2015 08:10  Ref. Range 01/14/2015 05:13  Hemoglobin A1C Latest Ref Range: 4.0-6.0 % 10.6 (H)    Current orders for Inpatient glycemic control: Lantus 25 units QHS, Novolog 0-15 units ACHS  Inpatient Diabetes Program Recommendations Insulin - Basal: Noted Lantus was increased from 18 units to 25 units so patient will receive Lantus 25 units tonight at bedtime. Insulin - Meal Coverage: If patient is eating at least 50% of meals, please consider ordering Novolog 6 units TID with meals (in additon to Novolog correction). HgbA1C: A1C 10.6% on 01/14/15 indicating poor outpatient glycemic control over the past 2-3 months.  Thanks, Orlando Penner, RN, MSN, CCRN, CDE Diabetes Coordinator Inpatient Diabetes Program [phone] (Team Pager from 8am to 5pm) [phone] ([facility] office) [phone] [facility] office) [phone] [facility] office)

## 2015-01-17 LAB — GLUCOSE, CAPILLARY
GLUCOSE-CAPILLARY: 505 mg/dL — AB (ref 65–99)
GLUCOSE-CAPILLARY: 558 mg/dL — AB (ref 65–99)
GLUCOSE-CAPILLARY: 416 mg/dL — AB (ref 65–99)
GLUCOSE-CAPILLARY: 495 mg/dL — AB (ref 65–99)
GLUCOSE-CAPILLARY: 386 mg/dL — AB (ref 65–99)
GLUCOSE-CAPILLARY: 276 mg/dL — AB (ref 65–99)

## 2015-01-17 LAB — BASIC METABOLIC PANEL
ANION GAP: 5 (ref 5–15)
BUN: 68 mg/dL — ABNORMAL HIGH (ref 6–20)
CHLORIDE: 109 mmol/L (ref 101–111)
CO2: 19 mmol/L — AB (ref 22–32)
Calcium: 8.3 mg/dL — ABNORMAL LOW (ref 8.9–10.3)
Creatinine, Ser: 3.1 mg/dL — ABNORMAL HIGH (ref 0.44–1.00)
GFR calc non Af Amer: 13 mL/min — ABNORMAL LOW (ref >60)
GFR, EST AFRICAN AMERICAN: 16 mL/min — AB (ref >60)
Glucose, Bld: 543 mg/dL (ref 65–99)
POTASSIUM: 4.8 mmol/L (ref 3.5–5.1)
Sodium: 133 mmol/L — ABNORMAL LOW (ref 135–145)

## 2015-01-17 LAB — HEMOGLOBIN: Hemoglobin: 8.6 g/dL — ABNORMAL LOW (ref 12.0–16.0)

## 2015-01-17 MED ORDER — INSULIN GLARGINE 100 UNIT/ML ~~LOC~~ SOLN
20.0000 [IU] | Freq: Two times a day (BID) | SUBCUTANEOUS | Status: DC
  Administered 2015-01-17: 20 [IU] via SUBCUTANEOUS
  Filled 2015-01-17 (×2): qty 0.2

## 2015-01-17 MED ORDER — INSULIN ASPART 100 UNIT/ML ~~LOC~~ SOLN
6.0000 [IU] | Freq: Once | SUBCUTANEOUS | Status: AC
  Administered 2015-01-17: 6 [IU] via SUBCUTANEOUS
  Filled 2015-01-17: qty 6

## 2015-01-17 MED ORDER — SODIUM CHLORIDE 0.9 % IV SOLN
INTRAVENOUS | Status: DC
  Administered 2015-01-17: 06:00:00 via INTRAVENOUS

## 2015-01-17 MED ORDER — MORPHINE SULFATE (PF) 2 MG/ML IV SOLN
2.0000 mg | INTRAVENOUS | Status: DC | PRN
  Administered 2015-01-17: 2 mg via INTRAVENOUS
  Filled 2015-01-17: qty 1

## 2015-01-17 MED ORDER — INSULIN ASPART 100 UNIT/ML ~~LOC~~ SOLN
6.0000 [IU] | Freq: Three times a day (TID) | SUBCUTANEOUS | Status: DC
  Administered 2015-01-17 (×2): 6 [IU] via SUBCUTANEOUS
  Filled 2015-01-17 (×2): qty 6

## 2015-01-17 MED ORDER — INSULIN GLARGINE 100 UNIT/ML ~~LOC~~ SOLN
16.0000 [IU] | Freq: Two times a day (BID) | SUBCUTANEOUS | Status: DC
  Administered 2015-01-17: 16 [IU] via SUBCUTANEOUS
  Filled 2015-01-17 (×3): qty 0.16

## 2015-01-17 NOTE — Progress Notes (Signed)
Patient ID: Gina Andersen, female   DOB: Sep 29, 1934, 80 y.o.   MRN: [id number] [facility] PROGRESS NOTE  PCP: Gina Bucks., MD  HPI/Subjective: Sugars up above 500 this morning. Patient feels okay. She does not like anybody touching her, so the in and out catheterizations Did not work out too well. Foley catheter replaced last night.  Objective: Filed Vitals:   01/17/15 0530  BP: 131/59  Pulse: 81  Temp: 99.4 F (37.4 C)  Resp: 18    Filed Weights   01/14/15 0427 01/16/15 0540 01/17/15 0552  Weight: 59.557 kg (131 lb 4.8 oz) 61.735 kg (136 lb 1.6 oz) 65.273 kg (143 lb 14.4 oz)    ROS: Review of Systems  Constitutional: Negative for fever and chills.  Eyes: Negative for blurred vision.  Respiratory: Positive for cough. Negative for sputum production and shortness of breath.   Cardiovascular: Negative for chest pain.  Gastrointestinal: Positive for abdominal pain. Negative for nausea, vomiting, diarrhea and constipation.  Genitourinary: Negative for dysuria.  Musculoskeletal: Positive for myalgias and joint pain.  Neurological: Negative for dizziness and headaches.   Exam: Physical Exam  HENT:  Nose: No mucosal edema.  Mouth/Throat: No oropharyngeal exudate or posterior oropharyngeal edema.  Eyes: Conjunctivae, EOM and lids are normal. Pupils are equal, round, and reactive to light.  Neck: No JVD present. Carotid bruit is not present. No edema present. No thyroid mass and no thyromegaly present.  Cardiovascular: S1 normal and S2 normal.  Exam reveals no gallop.   No murmur heard. Pulses:      Dorsalis pedis pulses are 2+ on the right side, and 2+ on the left side.  Respiratory: No respiratory distress. She has no wheezes. She has no rhonchi. She has no rales.  GI: Soft. Bowel sounds are normal. There is tenderness in the suprapubic area.  Musculoskeletal:       Right ankle: She exhibits no swelling.       Left ankle: She exhibits no swelling.   Lymphadenopathy:    She has no cervical adenopathy.  Neurological: She is alert. No cranial nerve deficit.  Skin: Skin is warm. No rash noted. Nails show no clubbing.  Psychiatric: She has a normal mood and affect.    Data Reviewed: Basic Metabolic Panel:  Recent Labs Lab 01/13/15 0425 01/14/15 0513 01/15/15 0445 01/16/15 0441 01/17/15 0430  NA 132* 134* 136 136 133*  K 3.9 3.6 4.4 4.2 4.8  CL 106 108 111 112* 109  CO2 19* 19* 20* 18* 19*  GLUCOSE 332* 286* 363* 399* 543*  BUN 99* 84* 78* 68* 68*  CREATININE 4.23* 3.98* 3.85* 3.35* 3.10*  CALCIUM 7.9* 8.0* 8.0* 8.2* 8.3*   Liver Function Tests:  Recent Labs Lab 01/12/15 0121  AST 30  ALT 11*  ALKPHOS 150*  BILITOT 0.4  PROT 5.4*  ALBUMIN 1.8*   CBC:  Recent Labs Lab 01/11/15 2011 01/12/15 0121 01/13/15 0425 01/14/15 0513 01/15/15 0445 01/16/15 0441 01/17/15 0430  WBC 13.3* 10.0 8.7 10.9 9.3  --   --   NEUTROABS 10.8*  --   --   --   --   --   --   HGB 7.7* 7.4* 6.8* 9.4* 8.9* 8.5* 8.6*  HCT 23.0* 21.8* 20.2* 26.8* 26.1*  --   --   MCV 92.1 92.3 91.8 88.9 89.8  --   --   PLT 192 174 174 186 184  --   --     CBG:  Recent Labs  Lab 01/16/15 2109 01/16/15 2121 01/17/15 0527 01/17/15 0726 01/17/15 0936  GLUCAP 417* 418* 505* 558* 416*    Scheduled Meds: . cefTRIAXone (ROCEPHIN)  IV  2 g Intravenous Q24H  . ferrous sulfate  325 mg Oral BID WC  . insulin aspart  0-15 Units Subcutaneous TID AC & HS  . insulin aspart  6 Units Subcutaneous TID WC  . insulin glargine  20 Units Subcutaneous BID  . latanoprost  1 drop Both Eyes QHS  . levothyroxine  75 mcg Oral QAC breakfast  . mupirocin ointment  1 application Nasal BID  . pantoprazole  40 mg Oral QAC breakfast  . polyethylene glycol  17 g Oral Daily  . pregabalin  25 mg Oral BID  . sodium chloride  3 mL Intravenous Q12H    Assessment/Plan:  1. Clinical sepsis, acute cystitis with hematuria. Escherichia coli growing in both blood and urine  cultures. Antibiotics changed to Rocephin. Will need 2 weeks worth of antibiotics, I will switch to oral Keflex upon discharge  2. Bilateral hydronephrosis, possible bladder mass- likely will end up needing a cystoscopy by urology at some point. Urology once the follow-up as outpatient. Foley catheter had to be replaced last night she would not want the in and out catheters. 3. Acute renal failure- GFR still very poor. Creatinine very slow to improve. 4. Hyponatremia- improved with IV fluid hydration.  5. Diabetes uncontrolled- Lantus increased to 20 units subcutaneous injection twice a day and NovoLog 6 units 3 times a day with the sliding scale 6. Hypothyroidism unspecified- TSH high. Increased levothyroxine to 75 g daily. 7. Gastroesophageal reflux disease without esophagitis continue Protonix. 8. Cough-  9. An chest x-ray negativeemia- patient responded to one unit of blood. continue  ferrous sulfate.  Code Status:     Code Status Orders        Start     Ordered   01/12/15 0046  Full code   Continuous     01/12/15 0045    Advance Directive Documentation        Most Recent Value   Type of Advance Directive  Healthcare Power of Attorney   Pre-existing out of facility DNR order (yellow form or pink MOST form)     "MOST" Form in Place?       Family Communication: Spoke with daughter Gina Andersen. Disposition Plan: [facility] potentially tomorrow once sugars trending better.   Consultants:  Nephrology  Urology  Antibiotics:  Rocephin  Time spent: 20 minutes  Alford Highland  [facility]

## 2015-01-17 NOTE — Progress Notes (Signed)
Pt's CBG is 558.  Dr. Renae Gloss gave telephone order for Novolog 6 unit in addition to 15 units of SSI.  Will recheck in 1 hour.  Orson Ape, RN

## 2015-01-17 NOTE — Progress Notes (Signed)
Subjective:  Pt seen at bedside. Resting quietly. Answers minimal questions.   Cr about the same at 4.23.-> 3.98-> 3.85->3.35->3.10 Still lethargic but arousable. Blood sugars are high this AM A1c 10.6  Objective:  Vital signs in last 24 hours:  Temp:  [97.1 F (36.2 C)-99.4 F (37.4 C)] 99.4 F (37.4 C) (08/25 0530) Pulse Rate:  [72-81] 81 (08/25 0530) Resp:  [18-19] 18 (08/25 0530) BP: (100-131)/(59-82) 131/59 mmHg (08/25 0530) SpO2:  [96 %-100 %] 96 % (08/25 0530) Weight:  [65.273 kg (143 lb 14.4 oz)] 65.273 kg (143 lb 14.4 oz) (08/25 0552)  Weight change: 3.538 kg (7 lb 12.8 oz) Filed Weights   01/14/15 0427 01/16/15 0540 01/17/15 0552  Weight: 59.557 kg (131 lb 4.8 oz) 61.735 kg (136 lb 1.6 oz) 65.273 kg (143 lb 14.4 oz)    Intake/Output: I/O last 3 completed shifts: In: -  Out: 2051 [Urine:2050; Stool:1]   Intake/Output this shift:     Physical Exam: General: NAD, resting in bed  Head: Normocephalic, atraumatic. Decreased hearing  Eyes: Anicteric  Neck: Supple, trachea midline  Lungs:  Clear to auscultation normal effort  Heart: Regular rate and rhythm S1S2  Abdomen:  Soft, mild diffuse tenderness, BS present, mild distention  Extremities:  trace peripheral edema.  Neurologic: Lethargic but arousable, answers only a few questions   Skin: No acute  lesions    Foley in place     Basic Metabolic Panel:  Recent Labs Lab 01/13/15 0425 01/14/15 0513 01/15/15 0445 01/16/15 0441 01/17/15 0430  NA 132* 134* 136 136 133*  K 3.9 3.6 4.4 4.2 4.8  CL 106 108 111 112* 109  CO2 19* 19* 20* 18* 19*  GLUCOSE 332* 286* 363* 399* 543*  BUN 99* 84* 78* 68* 68*  CREATININE 4.23* 3.98* 3.85* 3.35* 3.10*  CALCIUM 7.9* 8.0* 8.0* 8.2* 8.3*    Liver Function Tests:  Recent Labs Lab 01/12/15 0121  AST 30  ALT 11*  ALKPHOS 150*  BILITOT 0.4  PROT 5.4*  ALBUMIN 1.8*   No results for input(s): LIPASE, AMYLASE in the last 168 hours. No results for input(s):  AMMONIA in the last 168 hours.  CBC:  Recent Labs Lab 01/11/15 2011 01/12/15 0121 01/13/15 0425 01/14/15 0513 01/15/15 0445 01/16/15 0441 01/17/15 0430  WBC 13.3* 10.0 8.7 10.9 9.3  --   --   NEUTROABS 10.8*  --   --   --   --   --   --   HGB 7.7* 7.4* 6.8* 9.4* 8.9* 8.5* 8.6*  HCT 23.0* 21.8* 20.2* 26.8* 26.1*  --   --   MCV 92.1 92.3 91.8 88.9 89.8  --   --   PLT 192 174 174 186 184  --   --     Cardiac Enzymes:  Recent Labs Lab 01/11/15 2011  TROPONINI 0.03    BNP: Invalid input(s): POCBNP  CBG:  Recent Labs Lab 01/16/15 1617 01/16/15 2109 01/16/15 2121 01/17/15 0527 01/17/15 0726  GLUCAP 381* 417* 418* 505* 558*    Microbiology: Results for orders placed or performed during the hospital encounter of 01/11/15  Urine culture     Status: None   Collection Time: 01/11/15  8:47 PM  Result Value Ref Range Status   Specimen Description URINE, CLEAN CATCH  Final   Special Requests NONE  Final   Culture >=100,000 COLONIES/mL ESCHERICHIA COLI  Final   Report Status 01/14/2015 FINAL  Final   Organism ID, Bacteria ESCHERICHIA COLI  Final  Susceptibility   Escherichia coli - MIC*    AMPICILLIN <=2 SENSITIVE Sensitive     CEFAZOLIN <=4 SENSITIVE Sensitive     CEFTRIAXONE <=1 SENSITIVE Sensitive     CIPROFLOXACIN >=4 RESISTANT Resistant     GENTAMICIN <=1 SENSITIVE Sensitive     IMIPENEM 0.5 SENSITIVE Sensitive     NITROFURANTOIN <=16 SENSITIVE Sensitive     TRIMETH/SULFA >=320 RESISTANT Resistant     PIP/TAZO Value in next row Sensitive      SENSITIVE<=4    * >=100,000 COLONIES/mL ESCHERICHIA COLI  Culture, blood (routine x 2)     Status: None   Collection Time: 01/11/15 10:41 PM  Result Value Ref Range Status   Specimen Description BLOOD LEFT ASSIST CONTROL  Final   Special Requests BOTTLES DRAWN AEROBIC AND ANAEROBIC 4CC  Final   Culture  Setup Time   Final    GRAM NEGATIVE RODS AEROBIC BOTTLE ONLY CRITICAL VALUE NOTED.  VALUE IS CONSISTENT WITH  PREVIOUSLY REPORTED AND CALLED VALUE.    Culture ESCHERICHIA COLI AEROBIC BOTTLE ONLY   Final   Report Status 01/16/2015 FINAL  Final   Organism ID, Bacteria ESCHERICHIA COLI  Final      Susceptibility   Escherichia coli - MIC*    AMPICILLIN <=2 SENSITIVE Sensitive     CEFTAZIDIME <=1 SENSITIVE Sensitive     CEFAZOLIN <=4 SENSITIVE Sensitive     CEFTRIAXONE <=1 SENSITIVE Sensitive     CIPROFLOXACIN >=4 RESISTANT Resistant     GENTAMICIN <=1 SENSITIVE Sensitive     IMIPENEM <=0.25 SENSITIVE Sensitive     TRIMETH/SULFA >=320 RESISTANT Resistant     * ESCHERICHIA COLI  Culture, blood (routine x 2)     Status: None   Collection Time: 01/11/15 10:41 PM  Result Value Ref Range Status   Specimen Description BLOOD RIGHT ASSIST CONTROL  Final   Special Requests BOTTLES DRAWN AEROBIC AND ANAEROBIC 4CC  Final   Culture  Setup Time   Final    GRAM NEGATIVE RODS IN BOTH AEROBIC AND ANAEROBIC BOTTLES CRITICAL RESULT CALLED TO, READ BACK BY AND VERIFIED WITH: CHRIS BENNETT,RN 01/12/2015 AT 1356 BY JRS.    Culture   Final    ESCHERICHIA COLI IN BOTH AEROBIC AND ANAEROBIC BOTTLES    Report Status 01/14/2015 FINAL  Final   Organism ID, Bacteria ESCHERICHIA COLI  Final      Susceptibility   Escherichia coli - MIC*    AMPICILLIN <=2 SENSITIVE Sensitive     CEFTAZIDIME <=1 SENSITIVE Sensitive     CEFAZOLIN <=4 SENSITIVE Sensitive     CEFTRIAXONE <=1 SENSITIVE Sensitive     CIPROFLOXACIN >=4 RESISTANT Resistant     GENTAMICIN <=1 SENSITIVE Sensitive     IMIPENEM <=0.25 SENSITIVE Sensitive     TRIMETH/SULFA >=320 RESISTANT Resistant     PIP/TAZO Value in next row Sensitive      SENSITIVE<=4    * ESCHERICHIA COLI  MRSA PCR Screening     Status: Abnormal   Collection Time: 01/12/15  7:11 AM  Result Value Ref Range Status   MRSA by PCR POSITIVE (A) NEGATIVE Final    Comment:        The GeneXpert MRSA Assay (FDA approved for NASAL specimens only), is one component of a comprehensive MRSA  colonization surveillance program. It is not intended to diagnose MRSA infection nor to guide or monitor treatment for MRSA infections. CRITICAL RESULT CALLED TO, READ BACK BY AND VERIFIED WITH: [name] HODGE AT 5027 ON 01/12/15  BY QSD     Coagulation Studies: No results for input(s): LABPROT, INR in the last 72 hours.  Urinalysis: No results for input(s): COLORURINE, LABSPEC, PHURINE, GLUCOSEU, HGBUR, BILIRUBINUR, KETONESUR, PROTEINUR, UROBILINOGEN, NITRITE, LEUKOCYTESUR in the last 72 hours.  Invalid input(s): APPERANCEUR    Imaging: Dg Chest Port 1 View  01/16/2015   CLINICAL DATA:  Altered mental status and abnormal renal function with UTIs, cough  EXAM: PORTABLE CHEST - 1 VIEW  COMPARISON:  01/11/2015  FINDINGS: Cardiac shadow is stable. Lungs are well aerated bilaterally. No acute bony abnormality is seen.  IMPRESSION: No acute abnormality noted.   Electronically Signed   By: Inez Catalina M.D.   On: 01/16/2015 13:06     Medications:   . sodium chloride 100 mL/hr at 01/17/15 0629   . cefTRIAXone (ROCEPHIN)  IV  2 g Intravenous Q24H  . Chlorhexidine Gluconate Cloth  6 each Topical Q0600  . ferrous sulfate  325 mg Oral BID WC  . insulin aspart  0-15 Units Subcutaneous TID AC & HS  . insulin glargine  16 Units Subcutaneous BID  . latanoprost  1 drop Both Eyes QHS  . levothyroxine  75 mcg Oral QAC breakfast  . mupirocin ointment  1 application Nasal BID  . pantoprazole  40 mg Oral QAC breakfast  . polyethylene glycol  17 g Oral Daily  . pregabalin  25 mg Oral BID  . sodium chloride  3 mL Intravenous Q12H   acetaminophen **OR** acetaminophen, guaiFENesin, LORazepam, morphine injection, ondansetron **OR** ondansetron (ZOFRAN) IV  Assessment/ Plan:  80 y.o. female with a PMHx of diabetes mellitus type II, hyperlipidemia, atropic dermatitis, history of cholecystitis, depression, anxiety, hypertension, CKD stage III baseline egfr 55, who was admitted to [facility] on 01/11/2015 for  evaluation of altered mental status.   1. Acute renal failure/CKD stage III baseline egfr of 58. Likely combination of UTI/sepsis causing ATN, hydronephrosis  - Bilateral hydronephrosis noted on renal ultrasound. - Serum creatinine further improved slightly to 3.10.  - Foley in place. Continue to monitor.  - outpatient follow up  2. UTI, sepsis: Had TNTC WBCs in the urine, E . Coli growing in the blood,  - treated with IV Rocephin  3. Anemia unspecified D64.9:  - Received blood transfusion this admission  4. DM-2/ CKD.Urine Pr/Cr ratio 0.9 - Poorly controlled - Hemoglobin A1c 10.6  5. Contact precautions for MRSA    LOS: 6 Madeline Bebout 8/25/20168:39 AM

## 2015-01-17 NOTE — Progress Notes (Signed)
Inpatient Diabetes Program Recommendations  AACE/ADA: New Consensus Statement on Inpatient Glycemic Control (2013)  Target Ranges:  Prepandial:   less than 140 mg/dL      Peak postprandial:   less than 180 mg/dL (1-2 hours)      Critically ill patients:  140 - 180 mg/dL    Current orders for Inpatient glycemic control: Lantus 16 units bid, Novolog 0-15 units ACHS  Inpatient Diabetes Program Recommendations Insulin - Basal: Noted Lantus was increased from 25 units qhs to 16 units bid beginning this am  Insulin - Meal Coverage: If patient is eating at least 50% of meals, please consider ordering Novolog 6 units TID with meals (in additon to Novolog correction).  HgbA1C: A1C 10.6% on 01/14/15 indicating poor outpatient glycemic control over the past 2-3 months.  Susette Racer, RN, BA, MHA, CDE Diabetes Coordinator Inpatient Diabetes Program  [phone] (Team Pager) [phone] [facility] Office) 01/17/2015 7:09 AM

## 2015-01-17 NOTE — Plan of Care (Signed)
Problem: Discharge Progression Outcomes Goal: Other Discharge Outcomes/Goals Outcome: Progressing Plan of care progress to goal for: 1. Pain-no c/o pain this shift 2. Hemodynamically-             -VSS, pt remains afebrile this shift             -IV ABT continues per orders 3. Complications-pt continues to have high BS, SSI coverage given per orders 4. Diet-pt had good po intake this shift 5. Activity-pt is on bedrest

## 2015-01-17 NOTE — Clinical Social Work Note (Signed)
CSW spoke to [place] pt's daughter to notify her that [facility] was not able to make an offer.  Pt's daughter asked if bed search could be extended but she did not know what count she wanted the search extended to.  Pt's daughter stated that she would call back once she had made her decision.  CSW will continue to follow.

## 2015-01-17 NOTE — Progress Notes (Signed)
PT Cancellation Note  Patient Details Name: Gina Andersen MRN: [id number] DOB: 01-Jan-1935   Cancelled Treatment:    Reason Eval/Treat Not Completed: Medical issues which prohibited therapy. Patient noted to have blood sugars in the 540s this morning, given her overall clinical picture and level of consciousness on PT attempt yesterday PT will hold at this time and re-attempt when patient is more appropriate medically.  Kerin Ransom, PT, DPT    01/17/2015, 12:08 PM

## 2015-01-17 NOTE — Progress Notes (Signed)
PT Cancellation Note  Patient Details Name: MEMORIE YOKOYAMA MRN: [id number] DOB: 1934-07-15   Cancelled Treatment:     PT entered the room and patient was asleep. PT attempted sternal rub and patient awoke briefly and began repeating lines about taking care of her son. PT deferred treatment as patient was not able to fully participate. PT will continue to attempt as appropriate and able.   Kerin Ransom, PT, DPT    01/17/2015, 8:20 AM

## 2015-01-17 NOTE — Progress Notes (Signed)
Notified about blood sugar of 500. Likely secondary to diabetes in the setting of sepsis. The patient is on iv antibiotics. I started the patient on fluid and we will dose sliding scale prior to breakfast.

## 2015-01-17 NOTE — Plan of Care (Signed)
Problem: Discharge Progression Outcomes Goal: Other Discharge Outcomes/Goals Outcome: Progressing Plan of Care Progress to Goal:   Pt was unable to void after six hours. Pt report abdominal pain. MD was paged and Dr. Clint Guy ordered morphine for pain and reinsertion of foley for bladder scan of . Pt has been resting well after foley insertion. Pt BS is high. Dr. Clint Guy is aware of pt BS of 418. A recheck of BS this am is 505. Dr. Sheryle Hail has ordered fluids for pt. MD is aware of pt edema and abdominal distention.

## 2015-01-18 DIAGNOSIS — N183 Chronic kidney disease, stage 3 (moderate): Secondary | ICD-10-CM

## 2015-01-18 DIAGNOSIS — E871 Hypo-osmolality and hyponatremia: Secondary | ICD-10-CM

## 2015-01-18 DIAGNOSIS — R1310 Dysphagia, unspecified: Secondary | ICD-10-CM

## 2015-01-18 DIAGNOSIS — E43 Unspecified severe protein-calorie malnutrition: Secondary | ICD-10-CM

## 2015-01-18 DIAGNOSIS — B9562 Methicillin resistant Staphylococcus aureus infection as the cause of diseases classified elsewhere: Secondary | ICD-10-CM

## 2015-01-18 DIAGNOSIS — B9629 Other Escherichia coli [E. coli] as the cause of diseases classified elsewhere: Secondary | ICD-10-CM

## 2015-01-18 DIAGNOSIS — F0280 Dementia in other diseases classified elsewhere without behavioral disturbance: Secondary | ICD-10-CM

## 2015-01-18 DIAGNOSIS — N178 Other acute kidney failure: Secondary | ICD-10-CM

## 2015-01-18 DIAGNOSIS — Z515 Encounter for palliative care: Secondary | ICD-10-CM

## 2015-01-18 LAB — BASIC METABOLIC PANEL
Anion gap: 1 — ABNORMAL LOW (ref 5–15)
BUN: 60 mg/dL — AB (ref 6–20)
CALCIUM: 8.4 mg/dL — AB (ref 8.9–10.3)
CO2: 23 mmol/L (ref 22–32)
Chloride: 116 mmol/L — ABNORMAL HIGH (ref 101–111)
Creatinine, Ser: 2.79 mg/dL — ABNORMAL HIGH (ref 0.44–1.00)
GFR calc Af Amer: 18 mL/min — ABNORMAL LOW (ref >60)
GFR calc non Af Amer: 15 mL/min — ABNORMAL LOW (ref >60)
GLUCOSE: 112 mg/dL — AB (ref 65–99)
Potassium: 4.1 mmol/L (ref 3.5–5.1)
Sodium: 140 mmol/L (ref 135–145)

## 2015-01-18 LAB — GLUCOSE, CAPILLARY
Glucose-Capillary: 72 mg/dL (ref 65–99)
Glucose-Capillary: 76 mg/dL (ref 65–99)
Glucose-Capillary: 159 mg/dL — ABNORMAL HIGH (ref 65–99)

## 2015-01-18 MED ORDER — CEPHALEXIN 250 MG PO CAPS: 250.0000 mg | ORAL_CAPSULE | Freq: Two times a day (BID) | ORAL | Status: DC

## 2015-01-18 MED ORDER — INSULIN GLARGINE 100 UNIT/ML ~~LOC~~ SOLN: 32.0000 [IU] | Freq: Every day | SUBCUTANEOUS | Status: AC

## 2015-01-18 MED ORDER — LEVOTHYROXINE SODIUM 75 MCG PO TABS: 75.0000 ug | ORAL_TABLET | Freq: Every day | ORAL | Status: DC

## 2015-01-18 MED ORDER — FERROUS SULFATE 325 (65 FE) MG PO TABS
325.0000 mg | ORAL_TABLET | Freq: Two times a day (BID) | ORAL | Status: DC
Start: 2015-01-18 — End: 2015-06-29

## 2015-01-18 MED ORDER — CEPHALEXIN 250 MG PO CAPS
250.0000 mg | ORAL_CAPSULE | Freq: Two times a day (BID) | ORAL | Status: DC
  Administered 2015-01-18: 250 mg via ORAL
  Filled 2015-01-18 (×3): qty 1

## 2015-01-18 MED ORDER — LORAZEPAM 0.5 MG PO TABS: 0.5000 mg | ORAL_TABLET | Freq: Four times a day (QID) | ORAL | Status: DC | PRN

## 2015-01-18 MED ORDER — INSULIN GLARGINE 100 UNIT/ML ~~LOC~~ SOLN
30.0000 [IU] | Freq: Every day | SUBCUTANEOUS | Status: DC
  Filled 2015-01-18: qty 0.3

## 2015-01-18 MED ORDER — PREGABALIN 25 MG PO CAPS
25.0000 mg | ORAL_CAPSULE | Freq: Two times a day (BID) | ORAL | Status: DC
Start: 2015-01-18 — End: 2015-02-07

## 2015-01-18 MED ORDER — INSULIN GLARGINE 100 UNIT/ML ~~LOC~~ SOLN
17.0000 [IU] | Freq: Two times a day (BID) | SUBCUTANEOUS | Status: DC
  Filled 2015-01-18 (×3): qty 0.17

## 2015-01-18 MED ORDER — INSULIN ASPART 100 UNIT/ML ~~LOC~~ SOLN: 0.0000 [IU] | Freq: Three times a day (TID) | SUBCUTANEOUS | Status: DC

## 2015-01-18 NOTE — Plan of Care (Signed)
Problem: Discharge Progression Outcomes Goal: Other Discharge Outcomes/Goals Outcome: Progressing Pt is alert to self, c/o pain in the stomach, tylenol given with relief. Foley remains in place, continues to be incontinent of stool. Awaiting placement at this time.

## 2015-01-18 NOTE — Progress Notes (Signed)
Inpatient Diabetes Program Recommendations  AACE/ADA: New Consensus Statement on Inpatient Glycemic Control (2013)  Target Ranges:  Prepandial:   less than 140 mg/dL      Peak postprandial:   less than 180 mg/dL (1-2 hours)      Critically ill patients:  140 - 180 mg/dL  Results for SENA, HOOPINGARNER (MRN [id number]) as of 01/18/2015 07:53  Ref. Range 01/17/2015 09:36 01/17/2015 11:28 01/17/2015 16:05 01/17/2015 21:47 01/18/2015 07:06  Glucose-Capillary Latest Ref Range: 65-99 mg/dL 782 (H) 956 (H) 213 (H) 276 (H) 72   Agree with current orders for Inpatient glycemic control   Insulin Basal: Lantus 17 units bid  Insulin Meal Coverage:  Novolog 6 units TID with meals (in additon to Novolog correction).  Insulin correction: Novolog 0-15 units tid and hs   Susette Racer, RN, [place], [place], CDE Diabetes Coordinator Inpatient Diabetes Program  [phone] (Team Pager) [phone] [facility] Office) 01/18/2015 7:57 AM

## 2015-01-18 NOTE — Clinical Social Work Note (Signed)
CSW spoke to pt's daughter and notified her that only [facility] is the only facility that is willing to take the pt at this time.  If she did not want to go back to this facility then her only other option would be to take the pt home.  CSW is checking with all [place] facilities again to verify that no other facility will be able to take pt.  CSW will f/u once facilities have responded.

## 2015-01-18 NOTE — Discharge Instructions (Signed)
Sepsis °Sepsis is a serious infection of your blood or tissues that affects your whole body. The infection that causes sepsis may be bacterial, viral, fungal, or parasitic. Sepsis may be life threatening. Sepsis can cause your blood pressure to drop. This may result in shock. Shock causes your central nervous system and your organs to stop working correctly.  °RISK FACTORS °Sepsis can happen in anyone, but it is more likely to happen in people who have weakened immune systems. °SIGNS AND SYMPTOMS  °Symptoms of sepsis can include: °· Fever or low body temperature (hypothermia). °· Rapid breathing (hyperventilation). °· Chills. °· Rapid heartbeat (tachycardia). °· Confusion or light-headedness. °· Trouble breathing. °· Urinating much less than usual. °· Cool, clammy skin or red, flushed skin. °· Other problems with the heart, kidneys, or brain. °DIAGNOSIS  °Your health care provider will likely do tests to look for an infection, to see if the infection has spread to your blood, and to see how serious your condition is. Tests can include: °· Blood tests, including cultures of your blood. °· Cultures of other fluids from your body, such as: °¨ Urine. °¨ Pus from wounds. °¨ Mucus coughed up from your lungs. °· Urine tests other than cultures. °· X-ray exams or other imaging tests. °TREATMENT  °Treatment will begin with elimination of the source of infection. If your sepsis is likely caused by a bacterial or fungal infection, you will be given antibiotic or antifungal medicines. °You may also receive: °· Oxygen. °· Fluids through an IV tube. °· Medicines to increase your blood pressure. °· A machine to clean your blood (dialysis) if your kidneys fail. °· A machine to help you breathe if your lungs fail. °SEEK IMMEDIATE MEDICAL CARE IF: °You get an infection or develop any of the signs and symptoms of sepsis after surgery or a hospitalization. °Document Released: 02/07/2003 Document Revised: 05/16/2013 Document Reviewed:  01/16/2013 °ExitCare® Patient Information ©2015 [employer]. This information is not intended to replace advice given to you by your health care provider. Make sure you discuss any questions you have with your health care provider. ° °

## 2015-01-18 NOTE — Discharge Summary (Signed)
[facility] [place] at [facility]   PATIENT NAME: Gina Andersen    MR#:  [id number]  DATE OF BIRTH:  Jan 06, 1935  DATE OF ADMISSION:  01/11/2015 ADMITTING PHYSICIAN: Crissie Figures, MD  DATE OF DISCHARGE: 01/18/2015  PRIMARY CARE PHYSICIAN: Nelda Bucks., MD    ADMISSION DIAGNOSIS:  Sepsis, due to unspecified organism [A41.9] Urinary tract infection with hematuria, site unspecified [N39.0, R31.9] Acute renal failure, unspecified acute renal failure type [N17.9]  DISCHARGE DIAGNOSIS:  Principal Problem:   Sepsis due to urinary tract infection Active Problems:   Acute kidney injury   Acute encephalopathy   DM (diabetes mellitus), type 2, uncontrolled   Chronic anemia   Anemia   Pressure ulcer   Urinary retention with incomplete bladder emptying   SECONDARY DIAGNOSIS:   Past Medical History  Diagnosis Date  . Hypothyroidism 11/01/1995  . Anxiety 11/01/1995  . Diabetes mellitus type II 05/25/1996    insulin-requiring  . Hyperlipemia 06/25/2002  . Atopic dermatitis   . History of MRI of brain and brain stem 07/10/2005    w amd w/o no mass/infarct small vess dz  . Dehydration     Hosp [facility], hyperglycemia and ketoacidosis (stopped insulin)  . Pancytopenia 06/24-7/05/2007    Tissue D/O  . History of gallstones 11/21/2007    Abd U/S multip gallstones contracted GB CBD ULN Tr hydronephr R  . Acute cholecystitis 12/1/-05/20/08    [facility], s/p lap choleycystectomy CTD DM w/hypoglycemic sz in ER  . Gallstones 05/11/08    abd U/S gallstones pericholeycystic fluid [facility]  . Gastro-esophageal reflux disease without esophagitis   . Depression   . Anxiety disorder   . Dysphagia   . Hearing impaired   . Hypertension   . Carpal tunnel syndrome   . Weakness   . Anemia   . Glaucoma   . Mood disorder   . Kidney disease     HOSPITAL COURSE:   1. Clinical sepsis, acute cystitis with hematuria. Escherichia coli grew out of urine and blood  cultures. Patient initially on vancomycin and Zosyn. When gram-negative rods grew out of cultures the vancomycin was discontinued. When Escherichia coli grew out of the cultures the Zosyn was switched over to Rocephin. Upon discharge Rocephin switched over to Keflex for completion of 14 days of treatment. 2. Bilateral hydronephrosis, debris in the bladder. Patient was seen in consultation by urology. They recommended initially Foley catheter. Upon going to the nursing home they recommended straight catheter's. The patient was having too much discomfort with the straight catheter and Foley was left in at this point. Foley catheter care needed at the nursing home facility. Follow-up with urology in 2 weeks as outpatient. Hopefully catheter can be removed at that time. At some point patient may end up needing a cystoscopy to rule out bladder mass. 3. Acute renal failure likely ATN. Creatinine very slow to improve during the entire hospital course despite IV fluid hydration creatinine upon discharge 2.79. Patient will follow-up with Dr. Thedore Mins nephrology as outpatient for another creatinine check. 4. Anemia likely of chronic disease. Patient was started on iron. Patient received 1 unit of packed red blood cells during the hospital course with good response. 5. Hyponatremia that improved with IV fluid hydration. 6. Diabetes uncontrolled- hemoglobin A1c elevated at 10.6. I've been slowly going up on her Lantus insulin. Yesterday her sugars were in the 500 range. Today sugar 72. I will switch Lantus to daily at bedtime dosing at 32 to units daily  at bedtime with sliding scale. If sugars still remain high you can do 6 units prior to meals plus the sliding scale but at this point I will just go with sliding scale. 7. Hypothyroidism unspecified- with a TSH being high 8 did increase the levothyroxine to 75 g daily 8. Gastroesophageal reflux disease without esophagitis on PPI 9. Patient does have a cough chest x-ray was  negative.  DISCHARGE CONDITIONS:   Satisfactory  CONSULTS OBTAINED:  Treatment Team:  Mady Haagensen, MD Bjorn Pippin, MD  DRUG ALLERGIES:   Allergies  Allergen Reactions  . Sulfa Antibiotics Other (See Comments)    unknown  . Hydrocodone-Acetaminophen Other (See Comments)    Reaction:  Dizziness   . Lamictal [Lamotrigine] Other (See Comments)    Altered Mental Status  . Lyrica [Pregabalin] Other (See Comments)    Altered Mental Status  . Meloxicam Nausea And Vomiting  . Prednisone Other (See Comments)    Altered Mental Status  . Seroquel [Quetiapine] Other (See Comments)    Altered Mental Status  . Zoloft [Sertraline] Other (See Comments)    Altered Mental Status  . Penicillins Rash    DISCHARGE MEDICATIONS:   Current Discharge Medication List    START taking these medications   Details  cephALEXin (KEFLEX) 250 MG capsule Take 1 capsule (250 mg total) by mouth 2 (two) times daily. Qty: 14 capsule, Refills: 0    ferrous sulfate 325 (65 FE) MG tablet Take 1 tablet (325 mg total) by mouth 2 (two) times daily with a meal. Refills: 3    insulin aspart (NOVOLOG) 100 UNIT/ML injection Inject 0-15 Units into the skin 4 (four) times daily -  before meals and at bedtime. Qty: 10 mL, Refills: 11      CONTINUE these medications which have CHANGED   Details  insulin glargine (LANTUS) 100 UNIT/ML injection Inject 0.32 mLs (32 Units total) into the skin at bedtime. Qty: 10 mL, Refills: 0    levothyroxine (SYNTHROID, LEVOTHROID) 75 MCG tablet Take 1 tablet (75 mcg total) by mouth daily before breakfast.    LORazepam (ATIVAN) 0.5 MG tablet Take 1 tablet (0.5 mg total) by mouth every 6 (six) hours as needed for anxiety. Qty: 30 tablet, Refills: 0    pregabalin (LYRICA) 25 MG capsule Take 1 capsule (25 mg total) by mouth 2 (two) times daily.      CONTINUE these medications which have NOT CHANGED   Details  bisacodyl (DULCOLAX) 5 MG EC tablet Take 10 mg by mouth daily as  needed for moderate constipation.    cholecalciferol (VITAMIN D) 1000 UNITS tablet Take 500 Units by mouth daily.    lidocaine (XYLOCAINE) 5 % ointment Apply 1 application topically daily as needed.    omeprazole (PRILOSEC) 20 MG capsule Take 20 mg by mouth daily.    travoprost, benzalkonium, (TRAVATAN) 0.004 % ophthalmic solution Place 1 drop into both eyes daily.       STOP taking these medications     hydrocortisone (ANUSOL-HC) 2.5 % rectal cream      insulin lispro (HUMALOG) 100 UNIT/ML injection      lamoTRIgine (LAMICTAL) 25 MG tablet      LORazepam (ATIVAN) 2 MG/ML concentrated solution      promethazine (PHENERGAN) 25 MG/ML injection      sitaGLIPtin (JANUVIA) 50 MG tablet      NEXIUM 40 MG capsule          DISCHARGE INSTRUCTIONS:   Follow-up with Dr. 1 day at rehabilitation Follow-up  with Dr. Thedore Mins nephrology one week Follow-up with Dr. Edwyna Shell urology 2 weeks Foley care needed. Change Foley every 3 weeks.  If you experience worsening of your admission symptoms, develop shortness of breath, life threatening emergency, suicidal or homicidal thoughts you must seek medical attention immediately by calling 911 or calling your MD immediately  if symptoms less severe.  You Must read complete instructions/literature along with all the possible adverse reactions/side effects for all the Medicines you take and that have been prescribed to you. Take any new Medicines after you have completely understood and accept all the possible adverse reactions/side effects.   Please note  You were cared for by a hospitalist during your hospital stay. If you have any questions about your discharge medications or the care you received while you were in the hospital after you are discharged, you can call the unit and asked to speak with the hospitalist on call if the hospitalist that took care of you is not available. Once you are discharged, your primary care physician will handle any  further medical issues. Please note that NO REFILLS for any discharge medications will be authorized once you are discharged, as it is imperative that you return to your primary care physician (or establish a relationship with a primary care physician if you do not have one) for your aftercare needs so that they can reassess your need for medications and monitor your lab values.    Today   CHIEF COMPLAINT:   Chief Complaint  Patient presents with  . Altered Mental Status    HISTORY OF PRESENT ILLNESS:  Shetara Launer  is a 80 y.o. female with a known history of altered mental status. Patient was found to have clinical sepsis.   VITAL SIGNS:  Blood pressure 110/59, pulse 64, temperature 97.5 F (36.4 C), temperature source Oral, resp. rate 17, height 5\' 3"  (1.6 m), weight 67.405 kg (148 lb 9.6 oz), SpO2 99 %.  I/O:   Intake/Output Summary (Last 24 hours) at 01/18/15 0812 Last data filed at 01/17/15 1050  Gross per 24 hour  Intake     50 ml  Output      0 ml  Net     50 ml    PHYSICAL EXAMINATION:  GENERAL:  80 y.o.-year-old patient lying in the bed with no acute distress.  EYES: Pupils equal, round, reactive to light and accommodation. No scleral icterus. Extraocular muscles intact.  HEENT: Head atraumatic, normocephalic. Oropharynx and nasopharynx clear.  NECK:  Supple, no jugular venous distention. No thyroid enlargement, no tenderness.  LUNGS: Normal breath sounds bilaterally, no wheezing, rales,rhonchi or crepitation. No use of accessory muscles of respiration.  CARDIOVASCULAR: S1, S2 normal. No murmurs, rubs, or gallops.  ABDOMEN: Soft, slight tenderness over bladder. Bowel sounds present. No organomegaly or mass.  EXTREMITIES: No pedal edema, cyanosis, or clubbing.  NEUROLOGIC: Cranial nerves II through XII are intact. Muscle strength 5/5 in all extremities. Sensation intact. Gait not checked.  PSYCHIATRIC: The patient is alert.  SKIN: No obvious rash, lesion, or ulcer.    DATA REVIEW:   CBC  Recent Labs Lab 01/15/15 0445  01/17/15 0430  WBC 9.3  --   --   HGB 8.9*  < > 8.6*  HCT 26.1*  --   --   PLT 184  --   --   < > = values in this interval not displayed.  Chemistries   Recent Labs Lab 01/12/15 0121  01/18/15 0446  NA 129*  < >  140  K 3.5  < > 4.1  CL 102  < > 116*  CO2 22  < > 23  GLUCOSE 437*  < > 112*  BUN 98*  < > 60*  CREATININE 4.20*  < > 2.79*  CALCIUM 8.0*  < > 8.4*  AST 30  --   --   ALT 11*  --   --   ALKPHOS 150*  --   --   BILITOT 0.4  --   --   < > = values in this interval not displayed.  Cardiac Enzymes  Recent Labs Lab 01/11/15 2011  TROPONINI 0.03    Microbiology Results  Results for orders placed or performed during the hospital encounter of 01/11/15  Urine culture     Status: None   Collection Time: 01/11/15  8:47 PM  Result Value Ref Range Status   Specimen Description URINE, CLEAN CATCH  Final   Special Requests NONE  Final   Culture >=100,000 COLONIES/mL ESCHERICHIA COLI  Final   Report Status 01/14/2015 FINAL  Final   Organism ID, Bacteria ESCHERICHIA COLI  Final      Susceptibility   Escherichia coli - MIC*    AMPICILLIN <=2 SENSITIVE Sensitive     CEFAZOLIN <=4 SENSITIVE Sensitive     CEFTRIAXONE <=1 SENSITIVE Sensitive     CIPROFLOXACIN >=4 RESISTANT Resistant     GENTAMICIN <=1 SENSITIVE Sensitive     IMIPENEM 0.5 SENSITIVE Sensitive     NITROFURANTOIN <=16 SENSITIVE Sensitive     TRIMETH/SULFA >=320 RESISTANT Resistant     PIP/TAZO Value in next row Sensitive      SENSITIVE<=4    * >=100,000 COLONIES/mL ESCHERICHIA COLI  Culture, blood (routine x 2)     Status: None   Collection Time: 01/11/15 10:41 PM  Result Value Ref Range Status   Specimen Description BLOOD LEFT ASSIST CONTROL  Final   Special Requests BOTTLES DRAWN AEROBIC AND ANAEROBIC 4CC  Final   Culture  Setup Time   Final    GRAM NEGATIVE RODS AEROBIC BOTTLE ONLY CRITICAL VALUE NOTED.  VALUE IS CONSISTENT WITH  PREVIOUSLY REPORTED AND CALLED VALUE.    Culture ESCHERICHIA COLI AEROBIC BOTTLE ONLY   Final   Report Status 01/16/2015 FINAL  Final   Organism ID, Bacteria ESCHERICHIA COLI  Final      Susceptibility   Escherichia coli - MIC*    AMPICILLIN <=2 SENSITIVE Sensitive     CEFTAZIDIME <=1 SENSITIVE Sensitive     CEFAZOLIN <=4 SENSITIVE Sensitive     CEFTRIAXONE <=1 SENSITIVE Sensitive     CIPROFLOXACIN >=4 RESISTANT Resistant     GENTAMICIN <=1 SENSITIVE Sensitive     IMIPENEM <=0.25 SENSITIVE Sensitive     TRIMETH/SULFA >=320 RESISTANT Resistant     * ESCHERICHIA COLI  Culture, blood (routine x 2)     Status: None   Collection Time: 01/11/15 10:41 PM  Result Value Ref Range Status   Specimen Description BLOOD RIGHT ASSIST CONTROL  Final   Special Requests BOTTLES DRAWN AEROBIC AND ANAEROBIC 4CC  Final   Culture  Setup Time   Final    GRAM NEGATIVE RODS IN BOTH AEROBIC AND ANAEROBIC BOTTLES CRITICAL RESULT CALLED TO, READ BACK BY AND VERIFIED WITH: CHRIS BENNETT,RN 01/12/2015 AT 1356 BY JRS.    Culture   Final    ESCHERICHIA COLI IN BOTH AEROBIC AND ANAEROBIC BOTTLES    Report Status 01/14/2015 FINAL  Final   Organism ID, Bacteria ESCHERICHIA COLI  Final      Susceptibility   Escherichia coli - MIC*    AMPICILLIN <=2 SENSITIVE Sensitive     CEFTAZIDIME <=1 SENSITIVE Sensitive     CEFAZOLIN <=4 SENSITIVE Sensitive     CEFTRIAXONE <=1 SENSITIVE Sensitive     CIPROFLOXACIN >=4 RESISTANT Resistant     GENTAMICIN <=1 SENSITIVE Sensitive     IMIPENEM <=0.25 SENSITIVE Sensitive     TRIMETH/SULFA >=320 RESISTANT Resistant     PIP/TAZO Value in next row Sensitive      SENSITIVE<=4    * ESCHERICHIA COLI  MRSA PCR Screening     Status: Abnormal   Collection Time: 01/12/15  7:11 AM  Result Value Ref Range Status   MRSA by PCR POSITIVE (A) NEGATIVE Final    Comment:        The GeneXpert MRSA Assay (FDA approved for NASAL specimens only), is one component of a comprehensive MRSA  colonization surveillance program. It is not intended to diagnose MRSA infection nor to guide or monitor treatment for MRSA infections. CRITICAL RESULT CALLED TO, READ BACK BY AND VERIFIED WITH: [name] HODGE AT 1610 ON 01/12/15 BY QSD     RADIOLOGY:  Dg Chest Port 1 View  01/16/2015   CLINICAL DATA:  Altered mental status and abnormal renal function with UTIs, cough  EXAM: PORTABLE CHEST - 1 VIEW  COMPARISON:  01/11/2015  FINDINGS: Cardiac shadow is stable. Lungs are well aerated bilaterally. No acute bony abnormality is seen.  IMPRESSION: No acute abnormality noted.   Electronically Signed   By: Alcide Clever M.D.   On: 01/16/2015 13:06    Management plans discussed with the patient, family and they are in agreement.  CODE STATUS:     Code Status Orders        Start     Ordered   01/12/15 0046  Full code   Continuous     01/12/15 0045    Advance Directive Documentation        Most Recent Value   Type of Advance Directive  Healthcare Power of Attorney   Pre-existing out of facility DNR order (yellow form or pink MOST form)     "MOST" Form in Place?        TOTAL TIME TAKING CARE OF THIS PATIENT: 35 minutes.    Alford Highland M.D on 01/18/2015 at 8:12 AM  Between 7am to 6pm - Pager - [phone]  After 6pm go to [email or web address] - password [email or web address] [facility]  [place] [facility]  Office  [phone]  CC: Primary care physician; Nelda Bucks., MD

## 2015-01-18 NOTE — Progress Notes (Signed)
Pt transferred to [facility] by EMS per orders via stretcher, glasses in pt belonging bag sent with EMS

## 2015-01-18 NOTE — Progress Notes (Signed)
Spoke with Leotis Shames, [facility] rep at [phone], to notify of non-emergent EMS transport.  Auth notification reference given as [id number].   Service date range good from 01/18/15 - 04/18/15.   Gap exception requested to determine if services can be considered at an in-network level.

## 2015-01-18 NOTE — Progress Notes (Signed)
Subjective:  Pt seen at bedside. Resting quietly. Answers minimal questions.   Cr slowly improving. 4.23.-> 3.98-> 3.85->3.35->3.10->2.79 Still lethargic but arousable. Blood sugars are high this AM A1c 10.6  Objective:  Vital signs in last 24 hours:  Temp:  [97.5 F (36.4 C)-99.5 F (37.5 C)] 97.5 F (36.4 C) (08/26 0454) Pulse Rate:  [64-79] 64 (08/26 0454) Resp:  [17-18] 17 (08/26 0454) BP: (110-124)/(52-59) 110/59 mmHg (08/26 0454) SpO2:  [97 %-99 %] 99 % (08/26 0454) Weight:  [67.405 kg (148 lb 9.6 oz)] 67.405 kg (148 lb 9.6 oz) (08/26 0500)  Weight change: 2.132 kg (4 lb 11.2 oz) Filed Weights   01/16/15 0540 01/17/15 0552 01/18/15 0500  Weight: 61.735 kg (136 lb 1.6 oz) 65.273 kg (143 lb 14.4 oz) 67.405 kg (148 lb 9.6 oz)    Intake/Output: I/O last 3 completed shifts: In: 20 [IV Piggyback:50] Out: 24 [Urine:400; Stool:1]   Intake/Output this shift:     Physical Exam: General: NAD, resting in bed  Head: Normocephalic, atraumatic. Decreased hearing  Eyes: Anicteric  Neck: Supple, trachea midline  Lungs:  Clear to auscultation normal effort  Heart: Regular rate and rhythm S1S2  Abdomen:  Soft, mild diffuse tenderness, BS present, mild distention  Extremities:  trace peripheral edema.  Neurologic: Lethargic but arousable, answers only a few questions   Skin: No acute  lesions    Foley in place     Basic Metabolic Panel:  Recent Labs Lab 01/14/15 0513 01/15/15 0445 01/16/15 0441 01/17/15 0430 01/18/15 0446  NA 134* 136 136 133* 140  K 3.6 4.4 4.2 4.8 4.1  CL 108 111 112* 109 116*  CO2 19* 20* 18* 19* 23  GLUCOSE 286* 363* 399* 543* 112*  BUN 84* 78* 68* 68* 60*  CREATININE 3.98* 3.85* 3.35* 3.10* 2.79*  CALCIUM 8.0* 8.0* 8.2* 8.3* 8.4*    Liver Function Tests:  Recent Labs Lab 01/12/15 0121  AST 30  ALT 11*  ALKPHOS 150*  BILITOT 0.4  PROT 5.4*  ALBUMIN 1.8*   No results for input(s): LIPASE, AMYLASE in the last 168 hours. No results  for input(s): AMMONIA in the last 168 hours.  CBC:  Recent Labs Lab 01/11/15 2011 01/12/15 0121 01/13/15 0425 01/14/15 0513 01/15/15 0445 01/16/15 0441 01/17/15 0430  WBC 13.3* 10.0 8.7 10.9 9.3  --   --   NEUTROABS 10.8*  --   --   --   --   --   --   HGB 7.7* 7.4* 6.8* 9.4* 8.9* 8.5* 8.6*  HCT 23.0* 21.8* 20.2* 26.8* 26.1*  --   --   MCV 92.1 92.3 91.8 88.9 89.8  --   --   PLT 192 174 174 186 184  --   --     Cardiac Enzymes:  Recent Labs Lab 01/11/15 2011  TROPONINI 0.03    BNP: Invalid input(s): POCBNP  CBG:  Recent Labs Lab 01/17/15 1128 01/17/15 1605 01/17/15 2147 01/18/15 0706 01/18/15 0905  GLUCAP 495* 386* 276* 72 19    Microbiology: Results for orders placed or performed during the hospital encounter of 01/11/15  Urine culture     Status: None   Collection Time: 01/11/15  8:47 PM  Result Value Ref Range Status   Specimen Description URINE, CLEAN CATCH  Final   Special Requests NONE  Final   Culture >=100,000 COLONIES/mL ESCHERICHIA COLI  Final   Report Status 01/14/2015 FINAL  Final   Organism ID, Bacteria ESCHERICHIA COLI  Final  Susceptibility   Escherichia coli - MIC*    AMPICILLIN <=2 SENSITIVE Sensitive     CEFAZOLIN <=4 SENSITIVE Sensitive     CEFTRIAXONE <=1 SENSITIVE Sensitive     CIPROFLOXACIN >=4 RESISTANT Resistant     GENTAMICIN <=1 SENSITIVE Sensitive     IMIPENEM 0.5 SENSITIVE Sensitive     NITROFURANTOIN <=16 SENSITIVE Sensitive     TRIMETH/SULFA >=320 RESISTANT Resistant     PIP/TAZO Value in next row Sensitive      SENSITIVE<=4    * >=100,000 COLONIES/mL ESCHERICHIA COLI  Culture, blood (routine x 2)     Status: None   Collection Time: 01/11/15 10:41 PM  Result Value Ref Range Status   Specimen Description BLOOD LEFT ASSIST CONTROL  Final   Special Requests BOTTLES DRAWN AEROBIC AND ANAEROBIC 4CC  Final   Culture  Setup Time   Final    GRAM NEGATIVE RODS AEROBIC BOTTLE ONLY CRITICAL VALUE NOTED.  VALUE IS  CONSISTENT WITH PREVIOUSLY REPORTED AND CALLED VALUE.    Culture ESCHERICHIA COLI AEROBIC BOTTLE ONLY   Final   Report Status 01/16/2015 FINAL  Final   Organism ID, Bacteria ESCHERICHIA COLI  Final      Susceptibility   Escherichia coli - MIC*    AMPICILLIN <=2 SENSITIVE Sensitive     CEFTAZIDIME <=1 SENSITIVE Sensitive     CEFAZOLIN <=4 SENSITIVE Sensitive     CEFTRIAXONE <=1 SENSITIVE Sensitive     CIPROFLOXACIN >=4 RESISTANT Resistant     GENTAMICIN <=1 SENSITIVE Sensitive     IMIPENEM <=0.25 SENSITIVE Sensitive     TRIMETH/SULFA >=320 RESISTANT Resistant     * ESCHERICHIA COLI  Culture, blood (routine x 2)     Status: None   Collection Time: 01/11/15 10:41 PM  Result Value Ref Range Status   Specimen Description BLOOD RIGHT ASSIST CONTROL  Final   Special Requests BOTTLES DRAWN AEROBIC AND ANAEROBIC 4CC  Final   Culture  Setup Time   Final    GRAM NEGATIVE RODS IN BOTH AEROBIC AND ANAEROBIC BOTTLES CRITICAL RESULT CALLED TO, READ BACK BY AND VERIFIED WITH: CHRIS BENNETT,RN 01/12/2015 AT 1356 BY JRS.    Culture   Final    ESCHERICHIA COLI IN BOTH AEROBIC AND ANAEROBIC BOTTLES    Report Status 01/14/2015 FINAL  Final   Organism ID, Bacteria ESCHERICHIA COLI  Final      Susceptibility   Escherichia coli - MIC*    AMPICILLIN <=2 SENSITIVE Sensitive     CEFTAZIDIME <=1 SENSITIVE Sensitive     CEFAZOLIN <=4 SENSITIVE Sensitive     CEFTRIAXONE <=1 SENSITIVE Sensitive     CIPROFLOXACIN >=4 RESISTANT Resistant     GENTAMICIN <=1 SENSITIVE Sensitive     IMIPENEM <=0.25 SENSITIVE Sensitive     TRIMETH/SULFA >=320 RESISTANT Resistant     PIP/TAZO Value in next row Sensitive      SENSITIVE<=4    * ESCHERICHIA COLI  MRSA PCR Screening     Status: Abnormal   Collection Time: 01/12/15  7:11 AM  Result Value Ref Range Status   MRSA by PCR POSITIVE (A) NEGATIVE Final    Comment:        The GeneXpert MRSA Assay (FDA approved for NASAL specimens only), is one component of  a comprehensive MRSA colonization surveillance program. It is not intended to diagnose MRSA infection nor to guide or monitor treatment for MRSA infections. CRITICAL RESULT CALLED TO, READ BACK BY AND VERIFIED WITH: [name] HODGE AT 6294 ON 01/12/15  BY QSD     Coagulation Studies: No results for input(s): LABPROT, INR in the last 72 hours.  Urinalysis: No results for input(s): COLORURINE, LABSPEC, PHURINE, GLUCOSEU, HGBUR, BILIRUBINUR, KETONESUR, PROTEINUR, UROBILINOGEN, NITRITE, LEUKOCYTESUR in the last 72 hours.  Invalid input(s): APPERANCEUR    Imaging: Dg Chest Port 1 View  01/16/2015   CLINICAL DATA:  Altered mental status and abnormal renal function with UTIs, cough  EXAM: PORTABLE CHEST - 1 VIEW  COMPARISON:  01/11/2015  FINDINGS: Cardiac shadow is stable. Lungs are well aerated bilaterally. No acute bony abnormality is seen.  IMPRESSION: No acute abnormality noted.   Electronically Signed   By: Inez Catalina M.D.   On: 01/16/2015 13:06     Medications:     . cephALEXin  250 mg Oral Q12H  . ferrous sulfate  325 mg Oral BID WC  . insulin aspart  0-15 Units Subcutaneous TID AC & HS  . insulin glargine  30 Units Subcutaneous QHS  . latanoprost  1 drop Both Eyes QHS  . levothyroxine  75 mcg Oral QAC breakfast  . pantoprazole  40 mg Oral QAC breakfast  . pregabalin  25 mg Oral BID  . sodium chloride  3 mL Intravenous Q12H   acetaminophen **OR** acetaminophen, guaiFENesin, LORazepam, morphine injection, ondansetron **OR** ondansetron (ZOFRAN) IV  Assessment/ Plan:  80 y.o. female with a PMHx of diabetes mellitus type II, hyperlipidemia, atropic dermatitis, history of cholecystitis, depression, anxiety, hypertension, CKD stage III baseline egfr 80, who was admitted to [facility] on 01/11/2015 for evaluation of altered mental status.   1. Acute renal failure/CKD stage III baseline egfr of 58. Likely combination of UTI/sepsis causing ATN, hydronephrosis  - Bilateral  hydronephrosis noted on renal ultrasound. - Serum creatinine further improved slightly to 3.10.  - Foley in place. Urology follow up. Discomfort with I/O cath - outpatient follow up  2. UTI, sepsis: Had TNTC WBCs in the urine, E . Coli growing in the blood,  - oral keflex now  3. Anemia unspecified D64.9:  - Received blood transfusion this admission  4. DM-2/ CKD.Urine Pr/Cr ratio 0.9 - Poorly controlled - Hemoglobin A1c 10.6  5. Contact precautions for MRSA    LOS: 7 Mette Southgate 8/26/201610:31 AM

## 2015-01-18 NOTE — Consult Note (Addendum)
Palliative Medicine Inpatient Consult Note   Name: Gina Andersen Date: 01/18/2015 MRN: [id number]  DOB: February 03, 1935  Referring Physician: Alford Highland, MD  Palliative Care consult requested for this 80 y.o. female for goals of medical therapy in patient with acute renal failure  Palliative Care Plan: I was not able to reach pts daughter at the listed number.  I know others have been able to get in touch with her during this hospitalization --but I do not (at this moment) have any other contact info for the daughter.  I am aware that  Julie's children can be reached for urgent matters. Since this was a non-urgent matter (related to possible palliative care needs) and since pt is being transferred imminently to a facility, and since pt cannot have a complex discussion about healthcare decisions, I am unable to assist with addressing any goals of care at this time.    REVIEW OF SYSTEMS:  Patient is not able to provide ROS due to mild dementia, lethargy,  and distraction about her being discharged soon.  SPIRITUAL SUPPORT SYSTEM: Yes  -notes reference prayers by family..  SOCIAL HISTORY:  reports that she has never smoked. She has never used smokeless tobacco. She reports that she does not drink alcohol or use illicit drugs.  Has resided at [facility]  (skilled facility) for last 8 mos.  Pt to go to [facility]. She will be a Long Term Care pt not rehab pt.  Family aware.    LEGAL DOCUMENTS:  Pt reportedly has HCPOA = daughter Raynelle Fanning listed at [phone] ---this is not a working phone number.  I tried to call and it is listed as a nonworking number.   CODE STATUS: Full code  PAST MEDICAL HISTORY: Past Medical History  Diagnosis Date  . Hypothyroidism 11/01/1995  . Anxiety 11/01/1995  . Diabetes mellitus type II 05/25/1996    insulin-requiring  . Hyperlipemia 06/25/2002  . Atopic dermatitis   . History of MRI of brain and brain stem 07/10/2005    w amd w/o no mass/infarct small vess dz   . Dehydration     Hosp [facility], hyperglycemia and ketoacidosis (stopped insulin)  . Pancytopenia 06/24-7/05/2007    Tissue D/O  . History of gallstones 11/21/2007    Abd U/S multip gallstones contracted GB CBD ULN Tr hydronephr R  . Acute cholecystitis 12/1/-05/20/08    [facility], s/p lap choleycystectomy CTD DM w/hypoglycemic sz in ER  . Gallstones 05/11/08    abd U/S gallstones pericholeycystic fluid [facility]  . Gastro-esophageal reflux disease without esophagitis   . Depression   . Anxiety disorder   . Dysphagia   . Hearing impaired   . Hypertension   . Carpal tunnel syndrome   . Weakness   . Anemia   . Glaucoma   . Mood disorder   . Kidney disease     PAST SURGICAL HISTORY:  Past Surgical History  Procedure Laterality Date  . Partial hysterectomy      OV intact  . Carpel tunn      right  . Trigger finger release      left  . Ganglionectomy    . Cholecystectomy    . Abdominal hysterectomy      ALLERGIES:  is allergic to sulfa antibiotics; hydrocodone-acetaminophen; lamictal; lyrica; meloxicam; prednisone; seroquel; zoloft; and penicillins.  MEDICATIONS:  Current Facility-Administered Medications  Medication Dose Route Frequency Provider Last Rate Last Dose  . acetaminophen (TYLENOL) tablet 650 mg  650 mg Oral Q6H PRN  Crissie Figures, MD   650 mg at 01/17/15 2305   Or  . acetaminophen (TYLENOL) suppository 650 mg  650 mg Rectal Q6H PRN Crissie Figures, MD      . cephALEXin [facility]) capsule 250 mg  250 mg Oral Q12H Alford Highland, MD   250 mg at 01/18/15 1052  . ferrous sulfate tablet 325 mg  325 mg Oral BID WC Alford Highland, MD   325 mg at 01/18/15 0913  . guaiFENesin (ROBITUSSIN) 100 MG/5ML solution 100 mg  5 mL Oral Q4H PRN Alford Highland, MD   100 mg at 01/16/15 2351  . insulin aspart (novoLOG) injection 0-15 Units  0-15 Units Subcutaneous TID AC & HS Oralia Manis, MD   3 Units at 01/18/15 1217  . insulin glargine (LANTUS) injection 30 Units  30 Units  Subcutaneous QHS Alford Highland, MD      . latanoprost (XALATAN) 0.005 % ophthalmic solution 1 drop  1 drop Both Eyes QHS Crissie Figures, MD   1 drop at 01/16/15 2219  . levothyroxine (SYNTHROID, LEVOTHROID) tablet 75 mcg  75 mcg Oral QAC breakfast Alford Highland, MD   75 mcg at 01/18/15 0913  . LORazepam (ATIVAN) tablet 0.5 mg  0.5 mg Oral Q6H PRN Crissie Figures, MD   0.5 mg at 01/12/15 0142  . morphine 2 MG/ML injection 2 mg  2 mg Intravenous Q4H PRN Wyatt Haste, MD   2 mg at 01/17/15 0026  . ondansetron (ZOFRAN) tablet 4 mg  4 mg Oral Q6H PRN Crissie Figures, MD       Or  . ondansetron [facility]) injection 4 mg  4 mg Intravenous Q6H PRN Crissie Figures, MD      . pantoprazole (PROTONIX) EC tablet 40 mg  40 mg Oral QAC breakfast Crissie Figures, MD   40 mg at 01/18/15 0914  . pregabalin (LYRICA) capsule 25 mg  25 mg Oral BID Crissie Figures, MD   25 mg at 01/18/15 0913  . sodium chloride 0.9 % injection 3 mL  3 mL Intravenous Q12H Crissie Figures, MD   3 mL at 01/18/15 0914    Vital Signs: BP 120/55 mmHg  Pulse 70  Temp(Src) 98.5 F (36.9 C) (Oral)  Resp 20  Ht  (1.6 m)  Wt 67.405 kg (148 lb 9.6 oz)  BMI 26.33 kg/m2  SpO2 99% Filed Weights   01/16/15 0540 01/17/15 0552 01/18/15 0500  Weight: 61.735 kg (136 lb 1.6 oz) 65.273 kg (143 lb 14.4 oz) 67.405 kg (148 lb 9.6 oz)    Estimated body mass index is 26.33 kg/(m^2) as calculated from the following:   Height as of this encounter:  (1.6 m).   Weight as of this encounter: 67.405 kg (148 lb 9.6 oz).  PERFORMANCE STATUS (ECOG) : 3 - Symptomatic, >50% confined to bed  PHYSICAL EXAM: Lying in bed, appears to be in NAD She is talking some with staff --who are busy with getting her ready for transfer to facility. Heart rrr no mgr Lungs cta anteriorly Temporal wasting is noted Skin is pale, warm, and dry She is hard of hearing  She is moving extremeties on her own   LABS: CBC:    Component Value Date/Time    WBC 9.3 01/15/2015 0445   WBC 4.3 09/25/2013 1355   WBC 4.5 03/17/2007 1345   HGB 8.6* 01/17/2015 0430   HGB 9.8* 09/25/2013 1355   HGB 11.3* 03/17/2007 1345  HCT 26.1* 01/15/2015 0445   HCT 28.9* 09/25/2013 1355   HCT 33.2* 03/17/2007 1345   PLT 184 01/15/2015 0445   PLT 77* 09/25/2013 1355   PLT 119* 03/17/2007 1345   MCV 89.8 01/15/2015 0445   MCV 97 09/25/2013 1355   MCV 90 03/17/2007 1345   NEUTROABS 10.8* 01/11/2015 2011   NEUTROABS 1.3* 05/21/2013 0535   NEUTROABS 2.6 03/17/2007 1345   LYMPHSABS 1.0 01/11/2015 2011   LYMPHSABS 1.2 05/21/2013 0535   LYMPHSABS 1.3 03/17/2007 1345   MONOABS 1.2* 01/11/2015 2011   MONOABS 0.3 05/21/2013 0535   EOSABS 0.3 01/11/2015 2011   EOSABS 0.2 05/21/2013 0535   EOSABS 0.2 03/17/2007 1345   BASOSABS 0.0 01/11/2015 2011   BASOSABS 0.0 05/21/2013 0535   BASOSABS 0.0 03/17/2007 1345   Comprehensive Metabolic Panel:    Component Value Date/Time   NA 140 01/18/2015 0446   NA 142 05/29/2014 1432   K 4.1 01/18/2015 0446   K 3.6 05/29/2014 1432   CL 116* 01/18/2015 0446   CL 106 05/29/2014 1432   CO2 23 01/18/2015 0446   CO2 28 05/29/2014 1432   BUN 60* 01/18/2015 0446   BUN 14 05/29/2014 1432   CREATININE 2.79* 01/18/2015 0446   CREATININE 0.99 05/29/2014 1432   GLUCOSE 112* 01/18/2015 0446   GLUCOSE 350* 05/29/2014 1432   CALCIUM 8.4* 01/18/2015 0446   CALCIUM 9.0 05/29/2014 1432   AST 30 01/12/2015 0121   AST 38* 05/29/2014 1432   ALT 11* 01/12/2015 0121   ALT 18 05/29/2014 1432   ALKPHOS 150* 01/12/2015 0121   ALKPHOS 58 05/29/2014 1432   BILITOT 0.4 01/12/2015 0121   BILITOT 1.0 05/29/2014 1432   PROT 5.4* 01/12/2015 0121   PROT 6.5 05/29/2014 1432   ALBUMIN 1.8* 01/12/2015 0121   ALBUMIN 2.9* 05/29/2014 1432    IMPRESSION: 1. Acute renal failure on CKD stage III ----Due to combination of UTI/sepsis causing ATN, hydronephrosis  - Bilateral hydronephrosis noted on renal ultrasound. - Serum creatinine further  improved slightly to 3.10.  - Foley in place. To have Urology follow up as outpt 2. UTI with severe sepsis with bacteremia  Due to UTI ( E . Coli )--on oral ABX now 3. Anemia (sp transfusion this adm) etiology unclear--said to be chronic 4.  Uncontrolled DM-2 with Hemoglobin A1c 10.6 5.  CKD due to DM2 6.  MRSA positive by screen 7.  Metabolic Encephalopathy in presence of probably Dementia --is usually oriented to self and follows commands and can convey simple needs 8.  HTN 9.  Dyslipidemia 10.Hypothyroidism 11.Anxiety 12. Mild to Moderate oral pharyngeal dysphagia (with coughing and throat clearing) ---to be on Dysphagia 1 (pureed) with thin liquids  13.  Deafness (reads lips well) 14.  GERD 15.  Hyponatremia 16.  Severe Malnutrition   See top of note for plan   Time Spent:  40 minutes

## 2015-01-18 NOTE — Progress Notes (Signed)
Report called to [employer] to [facility] per orders.  Pt awaiting transport by EMS to facility

## 2015-01-18 NOTE — Clinical Social Work Note (Signed)
CSW spoke to pt's daughter again.  [facility] was able to make a bed offer. Facility is aware that pt is a LTC pt.  CSW notified the pt's daughter about this and after re confirming bed offers with [place] facilities.  Pt's daughter agreed [facility] was ok for pt.  CSW notified [facility] that pt would not be returning to their facility.  CSW also confirmed that pt's daughter had [place] DSS phone #.  Advised that she contact them to help with pt's transport for medical appointments.  F/U apts were highlighted on the pt's FL2 to the facility.  CSW signing off.

## 2015-02-04 ENCOUNTER — Other Ambulatory Visit
Admission: RE | Admit: 2015-02-04 | Discharge: 2015-02-04 | Disposition: A | Discharge status: Home / Self Care | Payer: Medicare Other | Source: Skilled Nursing Facility | Attending: Family Medicine | Admitting: Family Medicine

## 2015-02-04 DIAGNOSIS — R509 Fever, unspecified: Secondary | ICD-10-CM | POA: Insufficient documentation

## 2015-02-04 LAB — BASIC METABOLIC PANEL
Anion gap: 5 (ref 5–15)
BUN: 54 mg/dL — AB (ref 6–20)
CHLORIDE: 96 mmol/L — AB (ref 101–111)
CO2: 23 mmol/L (ref 22–32)
CREATININE: 2.96 mg/dL — AB (ref 0.44–1.00)
Calcium: 7.7 mg/dL — ABNORMAL LOW (ref 8.9–10.3)
GFR calc Af Amer: 17 mL/min — ABNORMAL LOW (ref >60)
GFR calc non Af Amer: 14 mL/min — ABNORMAL LOW (ref >60)
GLUCOSE: 913 mg/dL — AB (ref 65–99)
POTASSIUM: 5.6 mmol/L — AB (ref 3.5–5.1)
SODIUM: 124 mmol/L — AB (ref 135–145)

## 2015-02-04 LAB — CBC WITH DIFFERENTIAL/PLATELET
Basophils Absolute: 0 10*3/uL (ref 0–0.1)
Basophils Relative: 1 %
Eosinophils Absolute: 0.2 10*3/uL (ref 0–0.7)
Eosinophils Relative: 4 %
HEMATOCRIT: 23.8 % — AB (ref 35.0–47.0)
HEMOGLOBIN: 7.6 g/dL — AB (ref 12.0–16.0)
LYMPHS ABS: 1.2 10*3/uL (ref 1.0–3.6)
LYMPHS PCT: 22 %
MCH: 29.9 pg (ref 26.0–34.0)
MCHC: 31.9 g/dL — AB (ref 32.0–36.0)
MCV: 93.9 fL (ref 80.0–100.0)
MONOS PCT: 11 %
Monocytes Absolute: 0.6 10*3/uL (ref 0.2–0.9)
NEUTROS ABS: 3.4 10*3/uL (ref 1.4–6.5)
NEUTROS PCT: 62 %
Platelets: 136 10*3/uL — ABNORMAL LOW (ref 150–440)
RBC: 2.53 MIL/uL — AB (ref 3.80–5.20)
RDW: 14 % (ref 11.5–14.5)
WBC: 5.5 10*3/uL (ref 3.6–11.0)

## 2015-02-05 ENCOUNTER — Ambulatory Visit: Payer: Self-pay | Admitting: Obstetrics and Gynecology

## 2015-02-05 ENCOUNTER — Other Ambulatory Visit
Admission: RE | Admit: 2015-02-05 | Discharge: 2015-02-05 | Disposition: A | Discharge status: Home / Self Care | Payer: Medicare Other | Attending: Family Medicine | Admitting: Family Medicine

## 2015-02-05 DIAGNOSIS — K7460 Unspecified cirrhosis of liver: Secondary | ICD-10-CM | POA: Diagnosis present

## 2015-02-05 DIAGNOSIS — R739 Hyperglycemia, unspecified: Secondary | ICD-10-CM | POA: Insufficient documentation

## 2015-02-05 LAB — BASIC METABOLIC PANEL
Anion gap: 8 (ref 5–15)
BUN: 58 mg/dL — AB (ref 6–20)
CALCIUM: 8 mg/dL — AB (ref 8.9–10.3)
CO2: 23 mmol/L (ref 22–32)
CREATININE: 3.06 mg/dL — AB (ref 0.44–1.00)
Chloride: 96 mmol/L — ABNORMAL LOW (ref 101–111)
GFR calc Af Amer: 16 mL/min — ABNORMAL LOW (ref >60)
GFR, EST NON AFRICAN AMERICAN: 14 mL/min — AB (ref >60)
GLUCOSE: 263 mg/dL — AB (ref 65–99)
POTASSIUM: 4.7 mmol/L (ref 3.5–5.1)
SODIUM: 127 mmol/L — AB (ref 135–145)

## 2015-02-05 LAB — LIPASE, BLOOD: LIPASE: 19 U/L — AB (ref 22–51)

## 2015-02-05 LAB — AST: AST: 42 U/L — ABNORMAL HIGH (ref 15–41)

## 2015-02-05 LAB — ALT: ALT: 12 U/L — ABNORMAL LOW (ref 14–54)

## 2015-02-05 LAB — PHOSPHORUS: PHOSPHORUS: 3.6 mg/dL (ref 2.5–4.6)

## 2015-02-05 LAB — GAMMA GT: GGT: 159 U/L — ABNORMAL HIGH (ref 7–50)

## 2015-02-05 LAB — AMYLASE: Amylase: 38 U/L (ref 28–100)

## 2015-02-07 ENCOUNTER — Encounter: Payer: Self-pay | Admitting: Obstetrics and Gynecology

## 2015-02-07 ENCOUNTER — Ambulatory Visit (INDEPENDENT_AMBULATORY_CARE_PROVIDER_SITE_OTHER): Payer: Medicare Other | Admitting: Obstetrics and Gynecology

## 2015-02-07 VITALS — BP 147/78 | HR 77 | Ht 61.0 in | Wt 132.0 lb

## 2015-02-07 DIAGNOSIS — N390 Urinary tract infection, site not specified: Secondary | ICD-10-CM

## 2015-02-07 DIAGNOSIS — N1330 Unspecified hydronephrosis: Secondary | ICD-10-CM | POA: Diagnosis not present

## 2015-02-07 DIAGNOSIS — R339 Retention of urine, unspecified: Secondary | ICD-10-CM

## 2015-02-07 NOTE — Progress Notes (Signed)
02/07/2015 1:54 PM   Gina Andersen 1935-02-09 [id number]  Referring provider: Nelda Bucks, MD [address] [place], [place] [address]  Chief Complaint  Patient presents with  . Urinary Tract Infection  . Hematuria    HPI: Gina Andersen is a 80 year old deaf female, currently a resident at a skilled nursing facility, presenting today with her daughter. Significant history of poorly controlled diabetes with neuropathy.  She was recently admitted with urosepsis and acute renal failure. She was found to be in urinary retention with resulting hydroureteronephrosis elevated Creatinine levels. Foley catheter was placed while she was inpatient with subsequent improvement of creatinine levels and hydronephrosis. She was discharge with recommendations for either continuation of indwelling Foley for initiation of CIC.  Urine culture was recently sent by skilled nursing facility positive for ESBL she was started on IM Ertapenem per nursing staff this morning.  Our CMA in office spoke with nursing staff at facility and was informed that CIC was attempted at one point but patient became combative with staff and ultimately indwelling Foley was replaced.  02/05/15 Creatinine 3.06 02/01/15   Creatinine 2.96  12/2014  Creatinine 2.79   PMH: Past Medical History  Diagnosis Date  . Hypothyroidism 11/01/1995  . Anxiety 11/01/1995  . Diabetes mellitus type II 05/25/1996    insulin-requiring  . Hyperlipemia 06/25/2002  . Atopic dermatitis   . History of MRI of brain and brain stem 07/10/2005    w amd w/o no mass/infarct small vess dz  . Dehydration     Hosp [facility], hyperglycemia and ketoacidosis (stopped insulin)  . Pancytopenia 06/24-7/05/2007    Tissue D/O  . History of gallstones 11/21/2007    Abd U/S multip gallstones contracted GB CBD ULN Tr hydronephr R  . Acute cholecystitis 12/1/-05/20/08    [facility], s/p lap choleycystectomy CTD DM w/hypoglycemic sz in ER  . Gallstones 05/11/08    abd  U/S gallstones pericholeycystic fluid [facility]  . Gastro-esophageal reflux disease without esophagitis   . Depression   . Anxiety disorder   . Dysphagia   . Hearing impaired   . Hypertension   . Carpal tunnel syndrome   . Weakness   . Anemia   . Glaucoma   . Mood disorder   . Kidney disease     Surgical History: Past Surgical History  Procedure Laterality Date  . Partial hysterectomy      OV intact  . Carpel tunn      right  . Trigger finger release      left  . Ganglionectomy    . Cholecystectomy    . Abdominal hysterectomy      Home Medications:    Medication List       This list is accurate as of: 02/07/15 11:59 PM.  Always use your most recent med list.               bisacodyl 5 MG EC tablet  Commonly known as:  DULCOLAX  Take 10 mg by mouth daily as needed for moderate constipation.     cephALEXin 250 MG capsule  Commonly known as:  KEFLEX  Take 1 capsule (250 mg total) by mouth 2 (two) times daily.     cholecalciferol 1000 UNITS tablet  Commonly known as:  VITAMIN D  Take 500 Units by mouth daily.     ferrous sulfate 325 (65 FE) MG tablet  Take 1 tablet (325 mg total) by mouth 2 (two) times daily with a meal.  insulin glargine 100 UNIT/ML injection  Commonly known as:  LANTUS  Inject into the skin at bedtime.     insulin lispro 100 UNIT/ML injection  Commonly known as:  HUMALOG  Inject into the skin 3 (three) times daily before meals.     levothyroxine 75 MCG tablet  Commonly known as:  SYNTHROID, LEVOTHROID  Take 1 tablet (75 mcg total) by mouth daily before breakfast.     lidocaine 5 % ointment  Commonly known as:  XYLOCAINE  Apply 1 application topically daily as needed.     LORazepam 0.5 MG tablet  Commonly known as:  ATIVAN  Take 1 tablet (0.5 mg total) by mouth every 6 (six) hours as needed for anxiety.     omeprazole 20 MG capsule  Commonly known as:  PRILOSEC  Take 20 mg by mouth daily.     travoprost (benzalkonium) 0.004 %  ophthalmic solution  Commonly known as:  TRAVATAN  Place 1 drop into both eyes daily.        Allergies:  Allergies  Allergen Reactions  . Sulfa Antibiotics Other (See Comments)    unknown  . Hydrocodone-Acetaminophen Other (See Comments)    Reaction:  Dizziness   . Lamictal [Lamotrigine] Other (See Comments)    Altered Mental Status  . Lyrica [Pregabalin] Other (See Comments)    Altered Mental Status  . Meloxicam Nausea And Vomiting  . Prednisone Other (See Comments)    Altered Mental Status  . Seroquel [Quetiapine] Other (See Comments)    Altered Mental Status  . Zoloft [Sertraline] Other (See Comments)    Altered Mental Status  . Penicillins Rash    Family History: Family History  Problem Relation Age of Onset  . Kidney disease Mother   . Diabetes Mother   . Hypertension Mother   . Diabetes Brother   . Stroke Brother   . Stroke Brother     CVA  . Diabetes Brother     Social History:  reports that she has never smoked. She has never used smokeless tobacco. She reports that she does not drink alcohol or use illicit drugs.  ROS: UROLOGY Frequent Urination?: No Hard to postpone urination?: No Burning/pain with urination?: No Get up at night to urinate?: No Leakage of urine?: No Urine stream starts and stops?: No Trouble starting stream?: No Do you have to strain to urinate?: No Blood in urine?: No Urinary tract infection?: Yes Sexually transmitted disease?: No Injury to kidneys or bladder?: No Painful intercourse?: No Weak stream?: No Currently pregnant?: No Vaginal bleeding?: No Last menstrual period?: n  Gastrointestinal Nausea?: No Vomiting?: Yes Indigestion/heartburn?: Yes Diarrhea?: Yes Constipation?: No  Constitutional Fever: Yes Night sweats?: No Weight loss?: No Fatigue?: Yes  Skin Skin rash/lesions?: No Itching?: Yes  Eyes Blurred vision?: Yes Double vision?: No  Ears/Nose/Throat Sore throat?: No Sinus problems?:  No  Hematologic/Lymphatic Swollen glands?: No Easy bruising?: Yes  Cardiovascular Leg swelling?: No Chest pain?: No  Respiratory Cough?: No Shortness of breath?: Yes  Endocrine Excessive thirst?: Yes  Musculoskeletal Back pain?: Yes Joint pain?: Yes  Neurological Headaches?: No Dizziness?: No  Psychologic Depression?: No Anxiety?: No  Physical Exam: BP 147/78 mmHg  Pulse 77  Ht 5\' 1"  (1.549 m)  Wt 132 lb (59.875 kg)  BMI 24.95 kg/m2  Constitutional:  Alert, No acute distress. HEENT: [place] AT, moist mucus membranes.  Trachea midline, no masses. Cardiovascular: No clubbing, cyanosis, or edema. Respiratory: Normal respiratory effort, no increased work of breathing. GI: Abdomen is  soft, nontender, nondistended, no abdominal masses GU: No CVA tenderness. Foley catheter in place Skin: No rashes, bruises or suspicious lesions. Lymph: No cervical or inguinal adenopathy. Neurologic: Grossly intact, no focal deficits, moving all 4 extremities, generalized weakness with unsteady gait, patient uses wheelchair and transfers with two-person assistance Psychiatric: Normal mood and affect.  Laboratory Data:   Urinalysis    Component Value Date/Time   COLORURINE BROWN* 01/11/2015 2047   COLORURINE Yellow 05/29/2014 1432   APPEARANCEUR * 01/11/2015 2047    TEST NOT REPORTED DUE TO COLOR INTERFERENCE OF URINE PIGMENT   APPEARANCEUR Hazy 05/29/2014 1432   LABSPEC 1.015 01/11/2015 2047   LABSPEC 1.017 05/29/2014 1432   PHURINE 0.0* 01/11/2015 2047   PHURINE 5.0 05/29/2014 1432   GLUCOSEU * 01/11/2015 2047    TEST NOT REPORTED DUE TO COLOR INTERFERENCE OF URINE PIGMENT   GLUCOSEU >=500 05/29/2014 1432   HGBUR * 01/11/2015 2047    TEST NOT REPORTED DUE TO COLOR INTERFERENCE OF URINE PIGMENT   HGBUR 1+ 05/29/2014 1432   BILIRUBINUR * 01/11/2015 2047    TEST NOT REPORTED DUE TO COLOR INTERFERENCE OF URINE PIGMENT   BILIRUBINUR Negative 05/29/2014 1432   KETONESUR *  01/11/2015 2047    TEST NOT REPORTED DUE TO COLOR INTERFERENCE OF URINE PIGMENT   KETONESUR Trace 05/29/2014 1432   PROTEINUR * 01/11/2015 2047    TEST NOT REPORTED DUE TO COLOR INTERFERENCE OF URINE PIGMENT   PROTEINUR 30 mg/dL 16/02/9603 5409   UROBILINOGEN 0.2 12/11/2012 1906   NITRITE * 01/11/2015 2047    TEST NOT REPORTED DUE TO COLOR INTERFERENCE OF URINE PIGMENT   NITRITE Negative 05/29/2014 1432   LEUKOCYTESUR * 01/11/2015 2047    TEST NOT REPORTED DUE TO COLOR INTERFERENCE OF URINE PIGMENT   LEUKOCYTESUR 2+ 05/29/2014 1432    Pertinent Imaging: Pertinent labs & imaging results that were available during my care of the patient were reviewed by me and considered in my medical decision making (see chart for details).  01/14/15 CLINICAL DATA: Followup hydronephrosis status post Foley catheter placement. EXAM: RENAL / URINARY TRACT ULTRASOUND COMPLETE COMPARISON: 01/12/2015 FINDINGS: Right Kidney: Length: 11.5 cm. Normal renal cortical thickness and echogenicity without focal lesions. Persistent moderate hydronephrosis. No definite renal calculi. Left Kidney: Length: 9.6 cm. Mild renal cortical thinning and probable scarring change. Persistent hydronephrosis. Bladder: Decompressed by a Foley catheter. Other: Small amount of ascites.  IMPRESSION: Persistent bilateral hydronephrosis.  Assessment & Plan:    1. Recurrent UTI- recurrent UTI related to indwelling Foley for chronic urinary retention. Per nursing facility last urine culture was positive for ESBL and she was started on treatment with ertapenem this morning. Given patient's positive ESBL culture I will place a referral for her to be seen by infectious disease for consultation. - Urinalysis, Complete - BLADDER SCAN AMB NON-IMAGING  2. 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 We were unable to perform a pelvic exam today due to patient's mobility issues and the fact that  she did not arrive with appropriate staff to transfer. Ideal management would be with CIC to decrease risk of recurrent urinary tract infections. Orders written to attempt CIC per patient's tolerance and to repeat urine culture once treatment with ertapenem is finished.   3. Bilateral Hydronephrosis- Repeat renal ultrasound ordered today.   4. Elevated Serum Creatinine- Creatinine remains elevated despite continued indwelling Foley. We'll continue to monitor. Patient may need another nephrology consultation. 02/04/15 Cr 2.96 01/18/15 Cr 2.79 01/17/15 Cr 3.10  No Follow-up on file.  These notes generated with voice recognition software. I apologize for typographical errors.  Earlie Lou, FNP  [facility] [address] [place], [place] [address] [phone]

## 2015-02-07 NOTE — Progress Notes (Signed)
Catheter Removal  Patient is present today for a catheter removal.  10ml of water was drained from the balloon. A 16FR foley cath was removed from the bladder no complications were noted . Patient tolerated well.  Preformed by: Cid Agena CMA   

## 2015-02-08 ENCOUNTER — Telehealth: Payer: Self-pay | Admitting: Obstetrics and Gynecology

## 2015-02-08 DIAGNOSIS — K219 Gastro-esophageal reflux disease without esophagitis: Secondary | ICD-10-CM | POA: Insufficient documentation

## 2015-02-08 DIAGNOSIS — H409 Unspecified glaucoma: Secondary | ICD-10-CM | POA: Insufficient documentation

## 2015-02-08 DIAGNOSIS — R82998 Other abnormal findings in urine: Secondary | ICD-10-CM

## 2015-02-08 DIAGNOSIS — E079 Disorder of thyroid, unspecified: Secondary | ICD-10-CM | POA: Insufficient documentation

## 2015-02-08 DIAGNOSIS — K649 Unspecified hemorrhoids: Secondary | ICD-10-CM | POA: Insufficient documentation

## 2015-02-08 NOTE — Telephone Encounter (Signed)
After further review of patient's records I would like her to have a repeat renal ultrasound. I put order and we please notify patient's nursing facility. I believe she is a resident at [facility]. Please also notify her daughter we have a number available. I would like her to have this done prior to her next visit. Thank you

## 2015-02-11 NOTE — Telephone Encounter (Signed)
Spoke with pt daughter, Raynelle Fanning, and made aware for the need of another renal u/s. Raynelle Fanning voiced understanding. Orders were placed.

## 2015-02-18 ENCOUNTER — Other Ambulatory Visit
Admission: RE | Admit: 2015-02-18 | Discharge: 2015-02-18 | Disposition: A | Discharge status: Home / Self Care | Payer: Medicare Other | Source: Skilled Nursing Facility | Attending: Family Medicine | Admitting: Family Medicine

## 2015-02-18 DIAGNOSIS — E1121 Type 2 diabetes mellitus with diabetic nephropathy: Secondary | ICD-10-CM | POA: Diagnosis present

## 2015-02-18 LAB — BASIC METABOLIC PANEL
ANION GAP: 5 (ref 5–15)
BUN: 49 mg/dL — AB (ref 6–20)
CHLORIDE: 107 mmol/L (ref 101–111)
CO2: 22 mmol/L (ref 22–32)
Calcium: 8.7 mg/dL — ABNORMAL LOW (ref 8.9–10.3)
Creatinine, Ser: 3.33 mg/dL — ABNORMAL HIGH (ref 0.44–1.00)
GFR calc Af Amer: 14 mL/min — ABNORMAL LOW (ref >60)
GFR, EST NON AFRICAN AMERICAN: 12 mL/min — AB (ref >60)
GLUCOSE: 409 mg/dL — AB (ref 65–99)
POTASSIUM: 5.8 mmol/L — AB (ref 3.5–5.1)
Sodium: 134 mmol/L — ABNORMAL LOW (ref 135–145)

## 2015-02-18 LAB — CBC WITH DIFFERENTIAL/PLATELET
BASOS ABS: 0 10*3/uL (ref 0–0.1)
Basophils Relative: 0 %
EOS PCT: 1 %
Eosinophils Absolute: 0.1 10*3/uL (ref 0–0.7)
HEMATOCRIT: 21.1 % — AB (ref 35.0–47.0)
HEMOGLOBIN: 6.9 g/dL — AB (ref 12.0–16.0)
LYMPHS ABS: 1 10*3/uL (ref 1.0–3.6)
LYMPHS PCT: 18 %
MCH: 29.2 pg (ref 26.0–34.0)
MCHC: 32.9 g/dL (ref 32.0–36.0)
MCV: 88.7 fL (ref 80.0–100.0)
Monocytes Absolute: 0.3 10*3/uL (ref 0.2–0.9)
Monocytes Relative: 6 %
NEUTROS ABS: 4.5 10*3/uL (ref 1.4–6.5)
Neutrophils Relative %: 75 %
PLATELETS: 131 10*3/uL — AB (ref 150–440)
RBC: 2.38 MIL/uL — AB (ref 3.80–5.20)
RDW: 14.7 % — ABNORMAL HIGH (ref 11.5–14.5)
WBC: 6 10*3/uL (ref 3.6–11.0)

## 2015-02-20 ENCOUNTER — Ambulatory Visit
Admission: RE | Admit: 2015-02-20 | Discharge: 2015-02-20 | Disposition: A | Discharge status: Home / Self Care | Payer: Medicare Other | Source: Ambulatory Visit | Attending: Internal Medicine | Admitting: Internal Medicine

## 2015-02-20 DIAGNOSIS — R739 Hyperglycemia, unspecified: Secondary | ICD-10-CM | POA: Insufficient documentation

## 2015-02-20 DIAGNOSIS — K7460 Unspecified cirrhosis of liver: Secondary | ICD-10-CM | POA: Diagnosis not present

## 2015-02-20 LAB — HEMOGLOBIN: Hemoglobin: 7.3 g/dL — ABNORMAL LOW (ref 12.0–16.0)

## 2015-02-20 LAB — PREPARE RBC (CROSSMATCH)

## 2015-02-20 LAB — GLUCOSE, CAPILLARY: GLUCOSE-CAPILLARY: 171 mg/dL — AB (ref 65–99)

## 2015-02-20 MED ORDER — SODIUM CHLORIDE 0.9 % IV SOLN
Freq: Once | INTRAVENOUS | Status: AC
  Administered 2015-02-20: 10:00:00 via INTRAVENOUS

## 2015-02-20 MED ORDER — LORAZEPAM 2 MG/ML IJ SOLN
INTRAMUSCULAR | Status: AC
  Administered 2015-02-20: 1 mg via INTRAMUSCULAR
  Filled 2015-02-20: qty 1

## 2015-02-20 MED ORDER — LORAZEPAM 2 MG/ML IJ SOLN
1.0000 mg | Freq: Once | INTRAMUSCULAR | Status: AC
  Administered 2015-02-20: 1 mg via INTRAMUSCULAR
  Filled 2015-02-20: qty 0.5

## 2015-02-20 NOTE — OR Nursing (Signed)
Per documentation from [facility] - has pressure sores. Records show pt has catheter - caregiver states that has been removed

## 2015-02-20 NOTE — OR Nursing (Signed)
Pt present to sds via wheelchair accompanied by caregiver for blood transfusion. Assisted to stretcher warm blanket given.

## 2015-02-20 NOTE — OR Nursing (Signed)
Pt assisted to wheelchair. Pt in stable condition accompanied by Ardelia Mems.

## 2015-03-11 LAB — TYPE AND SCREEN
ABO/RH(D): O POS
ANTIBODY SCREEN: NEGATIVE
UNIT DIVISION: 0

## 2015-03-13 ENCOUNTER — Other Ambulatory Visit
Admission: RE | Admit: 2015-03-13 | Discharge: 2015-03-13 | Disposition: A | Discharge status: Home / Self Care | Payer: Medicare Other | Source: Skilled Nursing Facility | Attending: Family Medicine | Admitting: Family Medicine

## 2015-03-13 DIAGNOSIS — D649 Anemia, unspecified: Secondary | ICD-10-CM | POA: Insufficient documentation

## 2015-03-13 LAB — CBC WITH DIFFERENTIAL/PLATELET
Basophils Absolute: 0 10*3/uL (ref 0–0.1)
Basophils Relative: 0 %
Eosinophils Absolute: 0.1 10*3/uL (ref 0–0.7)
Eosinophils Relative: 1 %
HEMATOCRIT: 24.8 % — AB (ref 35.0–47.0)
HEMOGLOBIN: 8.5 g/dL — AB (ref 12.0–16.0)
LYMPHS ABS: 1.4 10*3/uL (ref 1.0–3.6)
Lymphocytes Relative: 19 %
MCH: 30.3 pg (ref 26.0–34.0)
MCHC: 34.2 g/dL (ref 32.0–36.0)
MCV: 88.6 fL (ref 80.0–100.0)
MONOS PCT: 6 %
Monocytes Absolute: 0.4 10*3/uL (ref 0.2–0.9)
NEUTROS ABS: 5.4 10*3/uL (ref 1.4–6.5)
NEUTROS PCT: 74 %
Platelets: 130 10*3/uL — ABNORMAL LOW (ref 150–440)
RBC: 2.8 MIL/uL — ABNORMAL LOW (ref 3.80–5.20)
RDW: 16.2 % — ABNORMAL HIGH (ref 11.5–14.5)
WBC: 7.3 10*3/uL (ref 3.6–11.0)

## 2015-03-13 LAB — COMPREHENSIVE METABOLIC PANEL
ALBUMIN: 2.3 g/dL — AB (ref 3.5–5.0)
ALK PHOS: 131 U/L — AB (ref 38–126)
ALT: 21 U/L (ref 14–54)
ANION GAP: 5 (ref 5–15)
AST: 39 U/L (ref 15–41)
BILIRUBIN TOTAL: 0.2 mg/dL — AB (ref 0.3–1.2)
BUN: 77 mg/dL — ABNORMAL HIGH (ref 6–20)
CALCIUM: 9.3 mg/dL (ref 8.9–10.3)
CO2: 23 mmol/L (ref 22–32)
CREATININE: 2.98 mg/dL — AB (ref 0.44–1.00)
Chloride: 109 mmol/L (ref 101–111)
GFR calc non Af Amer: 14 mL/min — ABNORMAL LOW (ref >60)
GFR, EST AFRICAN AMERICAN: 16 mL/min — AB (ref >60)
GLUCOSE: 219 mg/dL — AB (ref 65–99)
Potassium: 5.7 mmol/L — ABNORMAL HIGH (ref 3.5–5.1)
Sodium: 137 mmol/L (ref 135–145)
TOTAL PROTEIN: 7 g/dL (ref 6.5–8.1)

## 2015-03-17 ENCOUNTER — Other Ambulatory Visit
Admission: RE | Admit: 2015-03-17 | Discharge: 2015-03-17 | Disposition: A | Discharge status: Home / Self Care | Payer: Medicare Other | Source: Ambulatory Visit | Attending: Hematology and Oncology | Admitting: Hematology and Oncology

## 2015-03-17 DIAGNOSIS — N184 Chronic kidney disease, stage 4 (severe): Secondary | ICD-10-CM | POA: Insufficient documentation

## 2015-03-17 DIAGNOSIS — E1122 Type 2 diabetes mellitus with diabetic chronic kidney disease: Secondary | ICD-10-CM | POA: Insufficient documentation

## 2015-03-17 DIAGNOSIS — R1312 Dysphagia, oropharyngeal phase: Secondary | ICD-10-CM | POA: Insufficient documentation

## 2015-03-17 DIAGNOSIS — E1121 Type 2 diabetes mellitus with diabetic nephropathy: Secondary | ICD-10-CM | POA: Diagnosis not present

## 2015-03-17 LAB — CBC WITH DIFFERENTIAL/PLATELET
Basophils Absolute: 0 10*3/uL (ref 0–0.1)
Basophils Relative: 0 %
Eosinophils Absolute: 0.3 10*3/uL (ref 0–0.7)
Eosinophils Relative: 4 %
HCT: 27.5 % — ABNORMAL LOW (ref 35.0–47.0)
HEMOGLOBIN: 9.3 g/dL — AB (ref 12.0–16.0)
LYMPHS ABS: 1.5 10*3/uL (ref 1.0–3.6)
LYMPHS PCT: 20 %
MCH: 29.7 pg (ref 26.0–34.0)
MCHC: 34 g/dL (ref 32.0–36.0)
MCV: 87.3 fL (ref 80.0–100.0)
Monocytes Absolute: 0.4 10*3/uL (ref 0.2–0.9)
Monocytes Relative: 5 %
NEUTROS PCT: 71 %
Neutro Abs: 5 10*3/uL (ref 1.4–6.5)
Platelets: 133 10*3/uL — ABNORMAL LOW (ref 150–440)
RBC: 3.15 MIL/uL — AB (ref 3.80–5.20)
RDW: 15.7 % — ABNORMAL HIGH (ref 11.5–14.5)
WBC: 7.2 10*3/uL (ref 3.6–11.0)

## 2015-03-17 LAB — BASIC METABOLIC PANEL
Anion gap: 7 (ref 5–15)
BUN: 77 mg/dL — AB (ref 6–20)
CHLORIDE: 110 mmol/L (ref 101–111)
CO2: 26 mmol/L (ref 22–32)
CREATININE: 3.05 mg/dL — AB (ref 0.44–1.00)
Calcium: 9.6 mg/dL (ref 8.9–10.3)
GFR calc Af Amer: 16 mL/min — ABNORMAL LOW (ref >60)
GFR calc non Af Amer: 14 mL/min — ABNORMAL LOW (ref >60)
Glucose, Bld: 141 mg/dL — ABNORMAL HIGH (ref 65–99)
Potassium: 4 mmol/L (ref 3.5–5.1)
SODIUM: 143 mmol/L (ref 135–145)

## 2015-03-18 ENCOUNTER — Encounter: Payer: Self-pay | Admitting: Hematology and Oncology

## 2015-03-18 ENCOUNTER — Inpatient Hospital Stay: Payer: Medicare Other | Attending: Hematology and Oncology | Admitting: Hematology and Oncology

## 2015-03-18 ENCOUNTER — Telehealth: Payer: Self-pay | Admitting: *Deleted

## 2015-03-18 VITALS — BP 136/78 | HR 92 | Temp 96.0°F | Resp 18 | Ht 62.0 in

## 2015-03-18 DIAGNOSIS — F418 Other specified anxiety disorders: Secondary | ICD-10-CM | POA: Insufficient documentation

## 2015-03-18 DIAGNOSIS — E039 Hypothyroidism, unspecified: Secondary | ICD-10-CM

## 2015-03-18 DIAGNOSIS — Z794 Long term (current) use of insulin: Secondary | ICD-10-CM

## 2015-03-18 DIAGNOSIS — E785 Hyperlipidemia, unspecified: Secondary | ICD-10-CM

## 2015-03-18 DIAGNOSIS — I129 Hypertensive chronic kidney disease with stage 1 through stage 4 chronic kidney disease, or unspecified chronic kidney disease: Secondary | ICD-10-CM | POA: Diagnosis not present

## 2015-03-18 DIAGNOSIS — R109 Unspecified abdominal pain: Secondary | ICD-10-CM | POA: Diagnosis not present

## 2015-03-18 DIAGNOSIS — Z79899 Other long term (current) drug therapy: Secondary | ICD-10-CM | POA: Diagnosis not present

## 2015-03-18 DIAGNOSIS — K219 Gastro-esophageal reflux disease without esophagitis: Secondary | ICD-10-CM

## 2015-03-18 DIAGNOSIS — D649 Anemia, unspecified: Secondary | ICD-10-CM

## 2015-03-18 DIAGNOSIS — E119 Type 2 diabetes mellitus without complications: Secondary | ICD-10-CM | POA: Diagnosis not present

## 2015-03-18 DIAGNOSIS — N184 Chronic kidney disease, stage 4 (severe): Secondary | ICD-10-CM

## 2015-03-18 DIAGNOSIS — R5381 Other malaise: Secondary | ICD-10-CM

## 2015-03-18 DIAGNOSIS — R634 Abnormal weight loss: Secondary | ICD-10-CM | POA: Diagnosis not present

## 2015-03-18 LAB — T4, FREE: Free T4: 0.65 ng/dL (ref 0.61–1.12)

## 2015-03-18 LAB — FERRITIN: Ferritin: 191 ng/mL (ref 11–307)

## 2015-03-18 LAB — RETICULOCYTES
RBC.: 2.89 MIL/uL — ABNORMAL LOW (ref 3.80–5.20)
Retic Count, Absolute: 98.3 10*3/uL (ref 19.0–183.0)
Retic Ct Pct: 3.4 % — ABNORMAL HIGH (ref 0.4–3.1)

## 2015-03-18 LAB — FOLATE: Folate: 20.5 ng/mL (ref >5.9)

## 2015-03-18 LAB — IRON AND TIBC
Iron: 65 ug/dL (ref 28–170)
Saturation Ratios: 24 % (ref 10.4–31.8)
TIBC: 267 ug/dL (ref 250–450)
UIBC: 202 ug/dL

## 2015-03-18 LAB — VITAMIN B12: Vitamin B-12: 324 pg/mL (ref 180–914)

## 2015-03-18 NOTE — Progress Notes (Signed)
[facility] day:  03/18/2015  Chief Complaint: Gina Andersen is a 80 y.o. female with stage IV chronic kidney disease and anemia who is referred in consultation by Heidi Dach, NP.  HPI: The patient is unable to provide much medical history.  Notes from [facility] noted admission from 08/19 until 01/18/2015 for Escherichia coli sepsis. E Coli grew out of her blood and urine. Notes indicate she had a bilateral hydronephrosis with debris in the bladder. She was seen by urology and nephrology. She was noted to have acute renal failure likely due to ATN. She was also noted to have anemia likely of chronic disease. She was started on oral iron. She received 1 unit of packed red blood cells.   Initial labs on admission included a BUN of 120 and creatinine 4.61 (CrCl 8 ml/minute). CBC included a hematocrit 23, hemoglobin 7.7 MCV 92.1, platelets 192,000, and white count 13,300 with an ANC of 10,800. Nadir CBC on 01/12/2005 revealed a hematocrit of 20.2 and hemoglobin of 6.8. Hemoglobin on 01/17/2015 was 8.6. At discharge, BUN was 60 and a creatinine of 2.69 (GFR 15 ml/min).  She has been transfused several times (07/18, 08/29, and 02/21/2015). SPEP on 01/12/2015 revealed no monoclonal protein. TSH on 01/15/2015 was elevated at 19.342. Ferritin was 202 on 01/13/2015.  Notes from Dr. Candiss Norse on 02/28/2015 indicate that she is a candidate for erythropoietin.  There were discussions with the family regarding plans for either palliative care or aggressive care.  Per her driver today, she is not a candidate for dialysis.    Available labs from [facility] from 01/25/2015 until 02/22/2015 revealed a hematocrit site a creatinine ranging between 2.5 and 2.99 (GFR 16.1-19.8 mL/minute). During that period of time, hematocrit has fluctuated between 22.1 and 25.4 with a hemoglobin of 6.9 to 8.3. CBC on 03/17/2015 revealed a hematocrit of 27.5, hemoglobin  9.3, MCV 87.3, platelets 133,000, white count 7200 with an ANC of 5000  She states that her diet consists of pancakes, milk, and apple juice. She has lost an unspecified amount of weight. She denies any melena or hematochezia.  It is unclear when she had her last colonoscopy.  Past Medical History  Diagnosis Date  . Hypothyroidism 11/01/1995  . Anxiety 11/01/1995  . Diabetes mellitus type II 05/25/1996    insulin-requiring  . Hyperlipemia 06/25/2002  . Atopic dermatitis   . History of MRI of brain and brain stem 07/10/2005    w amd w/o no mass/infarct small vess dz  . Dehydration     Hosp [facility], hyperglycemia and ketoacidosis (stopped insulin)  . Pancytopenia 06/24-7/05/2007    Tissue D/O  . History of gallstones 11/21/2007    Abd U/S multip gallstones contracted GB CBD ULN Tr hydronephr R  . Acute cholecystitis 12/1/-05/20/08    [facility], s/p lap choleycystectomy CTD DM w/hypoglycemic sz in ER  . Gallstones 05/11/08    abd U/S gallstones pericholeycystic fluid [facility]  . Gastro-esophageal reflux disease without esophagitis   . Depression   . Anxiety disorder   . Dysphagia   . Hearing impaired   . Hypertension   . Carpal tunnel syndrome   . Weakness   . Anemia   . Glaucoma   . Mood disorder ([place])   . Kidney disease     Past Surgical History  Procedure Laterality Date  . Partial hysterectomy      OV intact  . Carpel tunn  right  . Trigger finger release      left  . Ganglionectomy    . Cholecystectomy    . Abdominal hysterectomy      Family History  Problem Relation Age of Onset  . Kidney disease Mother   . Diabetes Mother   . Hypertension Mother   . Diabetes Brother   . Stroke Brother   . Stroke Brother     CVA  . Diabetes Brother     Social History:  reports that she has never smoked. She has never used smokeless tobacco. She reports that she does not drink alcohol or use illicit drugs.  The patient is accompanied by a Peter Congo, a driver from [place] today.  Allergies:  Allergies  Allergen Reactions  . Sulfa Antibiotics Other (See Comments)    unknown  . Hydrocodone-Acetaminophen Other (See Comments)    Reaction:  Dizziness   . Lamictal [Lamotrigine] Other (See Comments)    Altered Mental Status  . Lyrica [Pregabalin] Other (See Comments)    Altered Mental Status  . Meloxicam Nausea And Vomiting  . Prednisone Other (See Comments)    Altered Mental Status  . Seroquel [Quetiapine] Other (See Comments)    Altered Mental Status  . Zoloft [Sertraline] Other (See Comments)    Altered Mental Status  . Penicillins Rash    Current Medications: Current Outpatient Prescriptions  Medication Sig Dispense Refill  . bisacodyl (DULCOLAX) 5 MG EC tablet Take 10 mg by mouth daily as needed for moderate constipation.    . cholecalciferol (VITAMIN D) 1000 UNITS tablet Take 500 Units by mouth daily.    . ferrous sulfate 325 (65 FE) MG tablet Take 1 tablet (325 mg total) by mouth 2 (two) times daily with a meal.  3  . insulin glargine (LANTUS) 100 UNIT/ML injection Inject into the skin at bedtime.    . insulin lispro (HUMALOG) 100 UNIT/ML injection Inject into the skin 3 (three) times daily before meals.    Marland Kitchen levothyroxine (SYNTHROID, LEVOTHROID) 75 MCG tablet Take 1 tablet (75 mcg total) by mouth daily before breakfast.    . lidocaine (XYLOCAINE) 5 % ointment Apply 1 application topically daily as needed.    Marland Kitchen LORazepam (ATIVAN) 0.5 MG tablet Take 1 tablet (0.5 mg total) by mouth every 6 (six) hours as needed for anxiety. 30 tablet 0  . omeprazole (PRILOSEC) 20 MG capsule Take 20 mg by mouth daily.    . travoprost, benzalkonium, (TRAVATAN) 0.004 % ophthalmic solution Place 1 drop into both eyes daily.     . cephALEXin (KEFLEX) 250 MG capsule Take 1 capsule (250 mg total) by mouth 2 (two) times daily. (Patient not taking: Reported on 03/18/2015) 14 capsule 0   No current facility-administered medications for this visit.    Review  of Systems:  GENERAL:  Fatigue.  Minimally active.  No fevers or sweats.  Unspecified weight loss. PERFORMANCE STATUS (ECOG):  2-3 HEENT:  No visual changes, runny nose, sore throat, mouth sores or tenderness. Lungs: No shortness of breath or cough.  No hemoptysis. Cardiac:  No chest pain, palpitations, orthopnea, or PND. GI:  Abdomen hurts  today.  No nausea, vomiting, diarrhea, constipation, melena or hematochezia. GU:  No urgency, frequency, dysuria, or hematuria. Musculoskeletal:  No back pain.  No joint pain.  No muscle tenderness. Extremities:  No pain or swelling. Skin:  No rashes or skin changes. Neuro:  No headache, numbness or weakness, balance or coordination issues. Endocrine:  Diabetes.  Hypothyroid on supplimentation.  No hot flashes or night sweats. Psych:  Anxiety.  No mood changes or depression. Pain:  No focal pain. Review of systems:  All other systems reviewed and found to be negative.  Physical Exam: Blood pressure 136/78, pulse 92, temperature 96 F (35.6 C), temperature source Tympanic, resp. rate 18, height $RemoveBe'5\' 2"'bpULsyYHK$  (1.575 m). GENERAL:  Elderly woman sitting comfortably in a wheelchair in the exam room in no acute distress. MENTAL STATUS:  Poorly verbal.  Alert to person and place. HEAD:  Short gray hair.  Temporal wasting.  Normocephalic, atraumatic, face symmetric, no Cushingoid features. EYES:  Blue eyes.  Pupils equal round and reactive to light and accomodation.  No conjunctivitis or scleral icterus. ENT:  Oropharynx clear without lesion.  Edentulous.  Tongue normal. Mucous membranes moist.  RESPIRATORY:  Clear to auscultation without rales, wheezes or rhonchi. CARDIOVASCULAR:  Regular rate and rhythm without murmur, rub or gallop. ABDOMEN:  Soft, non-tender, with active bowel sounds, and no hepatosplenomegaly.  No guarding or rebound.  No masses. SKIN:  No rashes, ulcers or lesions. EXTREMITIES: Lower extremities thin.  No edema, no skin discoloration or  tenderness.  No palpable cords. LYMPH NODES: No palpable cervical, supraclavicular, axillary or inguinal adenopathy  NEUROLOGICAL: Unremarkable. PSYCH:  Appropriate.  Tearful at times.  Hospital Outpatient Visit on 03/17/2015  Component Date Value Ref Range Status  . WBC 03/17/2015 7.2  3.6 - 11.0 K/uL Final  . RBC 03/17/2015 3.15* 3.80 - 5.20 MIL/uL Final  . Hemoglobin 03/17/2015 9.3* 12.0 - 16.0 g/dL Final  . HCT 03/17/2015 27.5* 35.0 - 47.0 % Final  . MCV 03/17/2015 87.3  80.0 - 100.0 fL Final  . MCH 03/17/2015 29.7  26.0 - 34.0 pg Final  . MCHC 03/17/2015 34.0  32.0 - 36.0 g/dL Final  . RDW 03/17/2015 15.7* 11.5 - 14.5 % Final  . Platelets 03/17/2015 133* 150 - 440 K/uL Final  . Neutrophils Relative % 03/17/2015 71   Final  . Neutro Abs 03/17/2015 5.0  1.4 - 6.5 K/uL Final  . Lymphocytes Relative 03/17/2015 20   Final  . Lymphs Abs 03/17/2015 1.5  1.0 - 3.6 K/uL Final  . Monocytes Relative 03/17/2015 5   Final  . Monocytes Absolute 03/17/2015 0.4  0.2 - 0.9 K/uL Final  . Eosinophils Relative 03/17/2015 4   Final  . Eosinophils Absolute 03/17/2015 0.3  0 - 0.7 K/uL Final  . Basophils Relative 03/17/2015 0   Final  . Basophils Absolute 03/17/2015 0.0  0 - 0.1 K/uL Final  . Sodium 03/17/2015 143  135 - 145 mmol/L Final  . Potassium 03/17/2015 4.0  3.5 - 5.1 mmol/L Final  . Chloride 03/17/2015 110  101 - 111 mmol/L Final  . CO2 03/17/2015 26  22 - 32 mmol/L Final  . Glucose, Bld 03/17/2015 141* 65 - 99 mg/dL Final  . BUN 03/17/2015 77* 6 - 20 mg/dL Final  . Creatinine, Ser 03/17/2015 3.05* 0.44 - 1.00 mg/dL Final  . Calcium 03/17/2015 9.6  8.9 - 10.3 mg/dL Final  . GFR calc non Af Amer 03/17/2015 14* >60 mL/min Final  . GFR calc Af Amer 03/17/2015 16* >60 mL/min Final   Comment: (NOTE) The eGFR has been calculated using the CKD EPI equation. This calculation has not been validated in all clinical situations. eGFR's persistently <60 mL/min signify possible Chronic  Kidney Disease.   . Anion gap 03/17/2015 7  5 - 15 Final    Assessment:  Gina Andersen is a 80 y.o. female with a normocytic anemia likely secondary to anemia of chronic renal disease (CrCl 16-20 ml/min).  She is not a candidate for dialysis (per report today). She has required transfusion support.  She was admitted to [facility] with E coli sepsis.  Diet is poor.  She denies any bleeding.  It is unclear when she had her last colonoscopy.  SPEP on 01/12/2015 revealed no monoclonal protein. TSH on 01/15/2015 was 19.342 (elevated).  Ferritin was 202 on 01/13/2015.  Symptomatically, she is debilitated.  She is a resident at [facility].  Plan: 1. Discuss work-up of anemia.  Discuss differential diagnosis.  Discuss lab draw today and collection of 24 hour urine. 2. Labs today:  ferritin, iron studies, T4, B12, folate, retic, free light chains. 3. Collect 24 hour urine for UPEP and free light chains. 4. Guaiac cards x 3. 5. Complete facility form. 6. Preauth Procrit. 7. RTC in 7-10 days for review of work-up and discussion regarding direction of therapy.   Lequita Asal, MD  03/18/2015, 12:04 PM

## 2015-03-18 NOTE — Telephone Encounter (Signed)
States she has POA and was unable to make appt today, asking someone call her to discuss findings

## 2015-03-18 NOTE — Telephone Encounter (Addendum)
Paper work from facility says [name] is her responsible party and is her primary contact. She is listed as her primary decision maker per facilty, but no Advanced Directives or POA is listed

## 2015-03-18 NOTE — Telephone Encounter (Signed)
  Do we have confirmation of this?  M

## 2015-03-19 ENCOUNTER — Other Ambulatory Visit
Admission: RE | Admit: 2015-03-19 | Discharge: 2015-03-19 | Disposition: A | Discharge status: Home / Self Care | Payer: Medicare Other | Source: Skilled Nursing Facility | Attending: Family Medicine | Admitting: Family Medicine

## 2015-03-19 DIAGNOSIS — D649 Anemia, unspecified: Secondary | ICD-10-CM | POA: Insufficient documentation

## 2015-03-19 DIAGNOSIS — E039 Hypothyroidism, unspecified: Secondary | ICD-10-CM | POA: Diagnosis not present

## 2015-03-19 LAB — BASIC METABOLIC PANEL
Anion gap: 8 (ref 5–15)
BUN: 74 mg/dL — AB (ref 6–20)
CHLORIDE: 111 mmol/L (ref 101–111)
CO2: 21 mmol/L — ABNORMAL LOW (ref 22–32)
Calcium: 8.8 mg/dL — ABNORMAL LOW (ref 8.9–10.3)
Creatinine, Ser: 2.85 mg/dL — ABNORMAL HIGH (ref 0.44–1.00)
GFR calc Af Amer: 17 mL/min — ABNORMAL LOW (ref >60)
GFR calc non Af Amer: 15 mL/min — ABNORMAL LOW (ref >60)
Glucose, Bld: 222 mg/dL — ABNORMAL HIGH (ref 65–99)
POTASSIUM: 5 mmol/L (ref 3.5–5.1)
SODIUM: 140 mmol/L (ref 135–145)

## 2015-03-19 LAB — LIPID PANEL
Cholesterol: 119 mg/dL (ref 0–200)
HDL: 41 mg/dL (ref >40)
LDL CALC: 50 mg/dL (ref 0–99)
Total CHOL/HDL Ratio: 2.9 RATIO
Triglycerides: 139 mg/dL (ref <150)
VLDL: 28 mg/dL (ref 0–40)

## 2015-03-19 LAB — ERYTHROPOIETIN: Erythropoietin: 15.8 m[IU]/mL (ref 2.6–18.5)

## 2015-03-19 LAB — CBC WITH DIFFERENTIAL/PLATELET
Basophils Absolute: 0 10*3/uL (ref 0–0.1)
Basophils Relative: 0 %
EOS ABS: 0.1 10*3/uL (ref 0–0.7)
Eosinophils Relative: 1 %
HCT: 24.7 % — ABNORMAL LOW (ref 35.0–47.0)
HEMOGLOBIN: 8.1 g/dL — AB (ref 12.0–16.0)
LYMPHS ABS: 0.7 10*3/uL — AB (ref 1.0–3.6)
LYMPHS PCT: 6 %
MCH: 29.2 pg (ref 26.0–34.0)
MCHC: 32.6 g/dL (ref 32.0–36.0)
MCV: 89.6 fL (ref 80.0–100.0)
Monocytes Absolute: 0.4 10*3/uL (ref 0.2–0.9)
Monocytes Relative: 3 %
NEUTROS PCT: 90 %
Neutro Abs: 11.9 10*3/uL — ABNORMAL HIGH (ref 1.4–6.5)
Platelets: 108 10*3/uL — ABNORMAL LOW (ref 150–440)
RBC: 2.76 MIL/uL — AB (ref 3.80–5.20)
RDW: 16.7 % — ABNORMAL HIGH (ref 11.5–14.5)
WBC: 13.2 10*3/uL — AB (ref 3.6–11.0)

## 2015-03-19 LAB — KAPPA/LAMBDA LIGHT CHAINS
Kappa free light chain: 235.51 mg/L — ABNORMAL HIGH (ref 3.30–19.40)
Kappa, lambda light chain ratio: 1.05 (ref 0.26–1.65)
Lambda free light chains: 224.79 mg/L — ABNORMAL HIGH (ref 5.71–26.30)

## 2015-03-19 LAB — TSH: TSH: 47.146 u[IU]/mL — ABNORMAL HIGH (ref 0.350–4.500)

## 2015-03-20 ENCOUNTER — Other Ambulatory Visit
Admission: RE | Admit: 2015-03-20 | Discharge: 2015-03-20 | Disposition: A | Discharge status: Home / Self Care | Payer: Medicare Other | Source: Skilled Nursing Facility | Attending: Family Medicine | Admitting: Family Medicine

## 2015-03-20 DIAGNOSIS — D72829 Elevated white blood cell count, unspecified: Secondary | ICD-10-CM | POA: Diagnosis present

## 2015-03-21 ENCOUNTER — Telehealth: Payer: Self-pay | Admitting: *Deleted

## 2015-03-21 LAB — URINALYSIS COMPLETE WITH MICROSCOPIC (ARMC ONLY)
Bilirubin Urine: NEGATIVE
Glucose, UA: 150 mg/dL — AB
Ketones, ur: NEGATIVE mg/dL
Nitrite: NEGATIVE
Protein, ur: 100 mg/dL — AB
Specific Gravity, Urine: 1.01 (ref 1.005–1.030)
Squamous Epithelial / HPF: NONE SEEN
pH: 5 (ref 5.0–8.0)

## 2015-03-21 NOTE — Telephone Encounter (Signed)
Returned call adn spoke with Gina Andersen, she will get message to [place] and they will check with PMD about inserting foley cath for 24 hours

## 2015-03-21 NOTE — Telephone Encounter (Signed)
24 hour urine collection ordered and patient is incontinent. Asking if something else can be ordered to get what results we need or for an order to insert foley cath for 24 hours to collect 24 hour urine

## 2015-03-21 NOTE — Telephone Encounter (Signed)
  Will need to first discuss with PCP regarding a Foley catheter.  M

## 2015-03-22 LAB — URINE CULTURE

## 2015-03-23 ENCOUNTER — Other Ambulatory Visit
Admission: RE | Admit: 2015-03-23 | Discharge: 2015-03-23 | Disposition: A | Discharge status: Home / Self Care | Payer: Medicare Other | Source: Ambulatory Visit | Attending: Family Medicine | Admitting: Family Medicine

## 2015-03-23 DIAGNOSIS — N179 Acute kidney failure, unspecified: Secondary | ICD-10-CM | POA: Diagnosis present

## 2015-03-23 DIAGNOSIS — N184 Chronic kidney disease, stage 4 (severe): Secondary | ICD-10-CM | POA: Insufficient documentation

## 2015-03-23 DIAGNOSIS — N3000 Acute cystitis without hematuria: Secondary | ICD-10-CM | POA: Diagnosis present

## 2015-03-23 DIAGNOSIS — E1122 Type 2 diabetes mellitus with diabetic chronic kidney disease: Secondary | ICD-10-CM | POA: Insufficient documentation

## 2015-03-25 ENCOUNTER — Other Ambulatory Visit
Admission: RE | Admit: 2015-03-25 | Discharge: 2015-03-25 | Disposition: A | Discharge status: Home / Self Care | Payer: Medicare Other | Source: Other Acute Inpatient Hospital | Attending: Nurse Practitioner | Admitting: Nurse Practitioner

## 2015-03-25 LAB — UPEP/TP, 24-HR URINE
ALBUMIN, U: 41.8 %
Alpha 1, Urine: 5.1 %
Alpha 2, Urine: 7.5 %
Beta, Urine: 14.7 %
GAMMA UR: 30.9 %
TOTAL PROTEIN, URINE-UPE24: 172.7 mg/dL
TOTAL PROTEIN, URINE-UR/DAY: 2676.9 mg/(24.h) — AB (ref 30.0–150.0)
Total Volume: 1550

## 2015-03-26 LAB — FREE K+L LT CHAINS,QN,UR
FREE LT CHN EXCR RATE: 390 mg/L — AB (ref 1.35–24.19)
Free Kappa/Lambda Ratio: 6.07 (ref 2.04–10.37)
Free Lambda Lt Chains,Ur: 64.3 mg/L — ABNORMAL HIGH (ref 0.24–6.66)
TOTAL VOLUME: 1550

## 2015-03-28 ENCOUNTER — Inpatient Hospital Stay: Payer: Medicare Other | Attending: Hematology and Oncology | Admitting: Hematology and Oncology

## 2015-03-28 DIAGNOSIS — Z794 Long term (current) use of insulin: Secondary | ICD-10-CM | POA: Insufficient documentation

## 2015-03-28 DIAGNOSIS — Z79899 Other long term (current) drug therapy: Secondary | ICD-10-CM | POA: Insufficient documentation

## 2015-03-28 DIAGNOSIS — I129 Hypertensive chronic kidney disease with stage 1 through stage 4 chronic kidney disease, or unspecified chronic kidney disease: Secondary | ICD-10-CM | POA: Insufficient documentation

## 2015-03-28 DIAGNOSIS — D631 Anemia in chronic kidney disease: Secondary | ICD-10-CM | POA: Insufficient documentation

## 2015-03-28 DIAGNOSIS — F418 Other specified anxiety disorders: Secondary | ICD-10-CM | POA: Insufficient documentation

## 2015-03-28 DIAGNOSIS — N184 Chronic kidney disease, stage 4 (severe): Secondary | ICD-10-CM | POA: Insufficient documentation

## 2015-03-28 DIAGNOSIS — E119 Type 2 diabetes mellitus without complications: Secondary | ICD-10-CM | POA: Insufficient documentation

## 2015-03-28 DIAGNOSIS — Z90710 Acquired absence of both cervix and uterus: Secondary | ICD-10-CM | POA: Insufficient documentation

## 2015-03-28 DIAGNOSIS — E039 Hypothyroidism, unspecified: Secondary | ICD-10-CM | POA: Insufficient documentation

## 2015-04-03 ENCOUNTER — Telehealth: Payer: Self-pay

## 2015-04-03 NOTE — Telephone Encounter (Signed)
Brandy from Dr. Jarrett AblesFitzgerald's office called asking why pt was referred for their care. Per Lillia AbedLindsay pt was referred at pt last visit on 02/07/15 for a UTI that was resistant to oral abx. Gearldine BienenstockBrandy stated pt currently has a PICC line and facility states pt currently does not have an infection. It is unknown who placed PICC line. Per Lillia AbedLindsay pt is present for UTI that was present at the time of office visit at [facility].

## 2015-04-05 ENCOUNTER — Telehealth: Payer: Self-pay | Admitting: Obstetrics and Gynecology

## 2015-04-05 NOTE — Telephone Encounter (Signed)
Brandy from Dr. Jarrett AblesFitzgerald's office called about this patient.  He has completed his care with her.  He is suggesting that we make an appointment with you for an in/out catheter.  Please advise.

## 2015-04-05 NOTE — Telephone Encounter (Signed)
Will you please investigate this and schedule an appointment for her.  thanks

## 2015-04-08 ENCOUNTER — Ambulatory Visit (INDEPENDENT_AMBULATORY_CARE_PROVIDER_SITE_OTHER): Payer: Medicare Other | Admitting: Podiatry

## 2015-04-08 DIAGNOSIS — B351 Tinea unguium: Secondary | ICD-10-CM | POA: Diagnosis not present

## 2015-04-08 DIAGNOSIS — M79676 Pain in unspecified toe(s): Secondary | ICD-10-CM

## 2015-04-08 NOTE — Progress Notes (Signed)
She presents today to pick up her diabetic shoes and I have her toenails trimmed because they are long and painful.  Objective: Vital signs are stable she is alert. Pulses are palpable bilateral. No open lesions are noted. Her nails are thick yellow dystrophic with mycotic and painful palpation.  Assessment: Diabetes mellitus with diabetic peripheral vascular disease and diabetic peripheral neuropathy hammertoe deformities are noted. Pain in limb secondary to onychomycosis.  Plan: Debridement of nails 1 through 5 bilateral covered service secondary to pain. Follow-up with her as needed.  Arbutus Pedodd Kaneesha Constantino DPM

## 2015-04-10 NOTE — Telephone Encounter (Signed)
Can you please schedule this

## 2015-04-12 NOTE — Telephone Encounter (Signed)
This pt has an appt for a RUS on 12/8 with f/u with you on 12/15. Do we need to move these appt up or schedule another appt for her to be seen before these appts? Please advise.

## 2015-04-12 NOTE — Telephone Encounter (Signed)
I would like her to have another appointment scheduled prior to her renal ultrasound to have a catheterized urine specimen obtained. She can be put on my schedule. Will you please notify her nursing home staff that she needs to have appropriate staff members accompanying her for transfer. Thank you

## 2015-04-16 ENCOUNTER — Encounter: Payer: Self-pay | Admitting: Hematology and Oncology

## 2015-04-16 ENCOUNTER — Inpatient Hospital Stay (HOSPITAL_BASED_OUTPATIENT_CLINIC_OR_DEPARTMENT_OTHER): Payer: Medicare Other | Admitting: Hematology and Oncology

## 2015-04-16 ENCOUNTER — Inpatient Hospital Stay: Payer: Medicare Other

## 2015-04-16 ENCOUNTER — Other Ambulatory Visit: Payer: Self-pay

## 2015-04-16 VITALS — BP 104/67 | HR 84 | Temp 96.0°F | Resp 18

## 2015-04-16 DIAGNOSIS — I129 Hypertensive chronic kidney disease with stage 1 through stage 4 chronic kidney disease, or unspecified chronic kidney disease: Secondary | ICD-10-CM | POA: Diagnosis present

## 2015-04-16 DIAGNOSIS — N185 Chronic kidney disease, stage 5: Secondary | ICD-10-CM | POA: Diagnosis not present

## 2015-04-16 DIAGNOSIS — E119 Type 2 diabetes mellitus without complications: Secondary | ICD-10-CM | POA: Diagnosis not present

## 2015-04-16 DIAGNOSIS — I120 Hypertensive chronic kidney disease with stage 5 chronic kidney disease or end stage renal disease: Secondary | ICD-10-CM | POA: Diagnosis not present

## 2015-04-16 DIAGNOSIS — F418 Other specified anxiety disorders: Secondary | ICD-10-CM | POA: Diagnosis not present

## 2015-04-16 DIAGNOSIS — K219 Gastro-esophageal reflux disease without esophagitis: Secondary | ICD-10-CM

## 2015-04-16 DIAGNOSIS — N184 Chronic kidney disease, stage 4 (severe): Secondary | ICD-10-CM | POA: Diagnosis not present

## 2015-04-16 DIAGNOSIS — D649 Anemia, unspecified: Secondary | ICD-10-CM

## 2015-04-16 DIAGNOSIS — E785 Hyperlipidemia, unspecified: Secondary | ICD-10-CM

## 2015-04-16 DIAGNOSIS — E1122 Type 2 diabetes mellitus with diabetic chronic kidney disease: Secondary | ICD-10-CM | POA: Diagnosis not present

## 2015-04-16 DIAGNOSIS — K649 Unspecified hemorrhoids: Secondary | ICD-10-CM

## 2015-04-16 DIAGNOSIS — Z794 Long term (current) use of insulin: Secondary | ICD-10-CM | POA: Diagnosis not present

## 2015-04-16 DIAGNOSIS — E039 Hypothyroidism, unspecified: Secondary | ICD-10-CM

## 2015-04-16 DIAGNOSIS — Z90710 Acquired absence of both cervix and uterus: Secondary | ICD-10-CM | POA: Diagnosis not present

## 2015-04-16 DIAGNOSIS — D631 Anemia in chronic kidney disease: Secondary | ICD-10-CM | POA: Insufficient documentation

## 2015-04-16 DIAGNOSIS — Z79899 Other long term (current) drug therapy: Secondary | ICD-10-CM | POA: Diagnosis not present

## 2015-04-16 DIAGNOSIS — R5381 Other malaise: Secondary | ICD-10-CM

## 2015-04-16 DIAGNOSIS — F419 Anxiety disorder, unspecified: Secondary | ICD-10-CM

## 2015-04-16 NOTE — Progress Notes (Signed)
[facility]-  Cancer Center  Clinic day:  04/16/2015  Chief Complaint: Gina Andersen is a 80 y.o. female with stage V chronic kidney disease and anemia who is referred in consultation by Gina Levelsiane Burke, NP.  HPI: The patient was seen in the medical oncology clinic on 03/18/2015.  At that time, she was seen for initial consultation regarding anemia.  Anemia was normocytic and felt likely secondary to anemia of chronic renal disease (CrCl 16-20 ml/min).  She was not a candidate for dialysis (per report today). She had required transfusion support.  She had been [facility]2015 with E coli sepsis.  Diet was poor.  She denied any bleeding.  It was unclear when she had her last colonoscopy.  She underwent a work-up.  CBC included a hematocrit of 27.5, hemoglobin 9.3, MCV 87.3, platelets 133,000, white count 7200 with an ANC of 5000. Differential was unremarkable.  BUN was 77 with a creatinine of 3.05 (creatinine clearance 14 ml/minute).  B12 was 324 and folate 20.5.  Reticulocyte count 3.4%.  Kappa free light chains to 35.5, lambda free light chains to 24.79 with a ratio of 1.05 (normal).  Free T4 was 0.65. Ferritin was 191. Iron studies included a saturation of 13% and a TIBC of 159 (low).  Erythropoietin level was 15.8 (inappropriately low).  24-hour urine returned on 03/23/2015 revealed 2.68 grams of protein per 24 hours. There was no monoclonal protein. Kappa free light chains were 390.00, lambda free light chains 64.3, with a ratio of 6.07 (normal).  SPEP on 01/12/2015 revealed no monoclonal protein. TSH on 01/15/2015 was 19.342 (elevated).  Ferritin was 202 on 01/13/2015.  During the interim, she notes issues with hemorrhoids.  She also apparently ate to much junk food recently and felt sick.  Past Medical History  Diagnosis Date  . Hypothyroidism 11/01/1995  . Anxiety 11/01/1995  . Diabetes mellitus type II 05/25/1996    insulin-requiring  . Hyperlipemia  06/25/2002  . Atopic dermatitis   . History of MRI of brain and brain stem 07/10/2005    w amd w/o no mass/infarct small vess dz  . Dehydration     [facility]osp [facility], hyperglycemia and ketoacidosis (stopped insulin)  . Pancytopenia 06/24-7/05/2007    Tissue D/O  . History of gallstones 11/21/2007    Abd U/S multip gallstones contracted GB CBD ULN Tr hydronephr R  . Acute cholecystitis 12/1/-05/20/08    [facility], s/p lap choleycystectomy CTD DM w/hypoglycemic sz in ER  . Gallstones 05/11/08    abd U/S gallstones pericholeycystic fluid [facility]  . Gastro-esophageal reflux disease without esophagitis   . Depression   . Anxiety disorder   . Dysphagia   . Hearing impaired   . Hypertension   . Carpal tunnel syndrome   . Weakness   . Anemia   . Glaucoma   . Mood disorder (HCC)   . Kidney disease     Past Surgical History  Procedure Laterality Date  . Partial hysterectomy      OV intact  . Carpel tunn      right  . Trigger finger release      left  . Ganglionectomy    . Cholecystectomy    . Abdominal hysterectomy      Family History  Problem Relation Age of Onset  . Kidney disease Mother   . Diabetes Mother   . Hypertension Mother   . Diabetes Brother   . Stroke Brother   . Stroke Brother  CVA  . Diabetes Brother     Social History:  reports that she has never smoked. She has never used smokeless tobacco. She reports that she does not drink alcohol or use illicit drugs.  The patient is accompanied by a Gina Andersen, an aide from [facility] [place] today.  Allergies:  Allergies  Allergen Reactions  . Sulfa Antibiotics Other (See Comments)    unknown  . Hydrocodone-Acetaminophen Other (See Comments)    Reaction:  Dizziness   . Lamictal [Lamotrigine] Other (See Comments)    Altered Mental Status  . Lyrica [Pregabalin] Other (See Comments)    Altered Mental Status  . Meloxicam Nausea And Vomiting  . Prednisone Other (See Comments)    Altered Mental Status  . Seroquel  [Quetiapine] Other (See Comments)    Altered Mental Status  . Zoloft [Sertraline] Other (See Comments)    Altered Mental Status  . Penicillins Rash    Current Medications: Current Outpatient Prescriptions  Medication Sig Dispense Refill  . bisacodyl (DULCOLAX) 5 MG EC tablet Take 10 mg by mouth daily as needed for moderate constipation.    . cholecalciferol (VITAMIN D) 1000 UNITS tablet Take 500 Units by mouth daily.    . ferrous sulfate 325 (65 FE) MG tablet Take 1 tablet (325 mg total) by mouth 2 (two) times daily with a meal.  3  . insulin glargine (LANTUS) 100 UNIT/ML injection Inject into the skin at bedtime.    . insulin lispro (HUMALOG) 100 UNIT/ML injection Inject into the skin 3 (three) times daily before meals.    Marland Kitchen levothyroxine (SYNTHROID, LEVOTHROID) 75 MCG tablet Take 1 tablet (75 mcg total) by mouth daily before breakfast.    . lidocaine (XYLOCAINE) 5 % ointment Apply 1 application topically daily as needed.    Marland Kitchen LORazepam (ATIVAN) 0.5 MG tablet Take 1 tablet (0.5 mg total) by mouth every 6 (six) hours as needed for anxiety. 30 tablet 0  . omeprazole (PRILOSEC) 20 MG capsule Take 20 mg by mouth daily.    . travoprost, benzalkonium, (TRAVATAN) 0.004 % ophthalmic solution Place 1 drop into both eyes daily.      No current facility-administered medications for this visit.    Review of Systems:  GENERAL:  Minimally active.  No fevers or sweats.  No new weight. PERFORMANCE STATUS (ECOG):  3 HEENT:  No visual changes, runny nose, sore throat, mouth sores or tenderness. Lungs: No shortness of breath or cough.  No hemoptysis. Cardiac:  No chest pain, palpitations, orthopnea, or PND. GI:  Painful hemorrhoids.  Upset stomach after eating too much junk food.  No nausea, vomiting, diarrhea, constipation, melena or hematochezia. GU:  No urgency, frequency, dysuria, or hematuria. Musculoskeletal:  No back pain.  No joint pain.  No muscle tenderness. Extremities:  No pain or  swelling. Skin:  No rashes or skin changes. Neuro:  Recent headache.  No numbness or weakness, balance or coordination issues. Endocrine:  Diabetes.  Hypothyroid on supplimentation.  No hot flashes or night sweats. Psych:  Anxiety.  No mood changes or depression. Pain:  No focal pain. Review of systems:  All other systems reviewed and found to be negative.  Physical Exam: Blood pressure 104/67, pulse 84, temperature 96 F (35.6 C), temperature source Tympanic, resp. rate 18. GENERAL:  Elderly woman sitting comfortably in a wheelchair in the exam room in no acute distress. MENTAL STATUS:  Verbal.  Talks about her dog, Raynelle Fanning.  Alert to person and place. HEAD:  Short gray  hair.  Temporal wasting.  Normocephalic, atraumatic, face symmetric, no Cushingoid features. EYES:  Glasses.  Blue eyes.  Pupils equal round and reactive to light and accomodation.  No conjunctivitis or scleral icterus. ENT:  Oropharynx clear without lesion.  Edentulous.  Tongue normal. Mucous membranes moist.  RESPIRATORY:  Poor respiratory excursion.  Clear to auscultation without rales, wheezes or rhonchi. CARDIOVASCULAR:  Regular rate and rhythm without murmur, rub or gallop. ABDOMEN:  Soft, non-tender, with active bowel sounds, and no hepatosplenomegaly.  No guarding or rebound.  No masses. SKIN:  No rashes, ulcers or lesions. EXTREMITIES: Lower extremities thin.  No edema, no skin discoloration or tenderness.  No palpable cords. LYMPH NODES: No palpable cervical, supraclavicular, axillary or inguinal adenopathy  NEUROLOGICAL: Unremarkable. PSYCH:  Appropriate.  No visits with results within 3 Day(s) from this visit. Latest known visit with results is:  Hospital Outpatient Visit on 03/25/2015  Component Date Value Ref Range Status  . Free Lt Chn Excr Rate 03/23/2015 390.00* 1.35 - 24.19 mg/L Final   **Results verified by repeat testing**  . Free Lambda Lt Chains,Ur 03/23/2015 64.30* 0.24 - 6.66 mg/L Final  . Free  Kappa/Lambda Ratio 03/23/2015 6.07  2.04 - 10.37 Final   Comment: (NOTE) Performed At: [facility] [address] [place], [place] [id number] Mila Homer MD WG:[id number]   . Total Volume 03/23/2015 1550   Final    Assessment:  Gina Andersen is a 80 y.o. female with a normocytic anemia secondary to anemia of chronic renal disease (CrCl 14 ml/min).  She is not a candidate for dialysis (per report). She has required transfusion support.  She was admitted to [facility] 01/11/2015 with E coli sepsis.  Diet is poor.  She denies any bleeding.  It is unclear when she had her last colonoscopy.  Work-up on 03/17/2015 revealed a hematocrit of 27.5, hemoglobin 9.3, MCV 87.3, platelets 133,000, white count 7200 with an ANC of 5000. Differential was unremarkable.  Creatinine was 3.05 (creatinine clearance 14 ml/minute).  Normal studies included a B12, folate 20.5, SPEP, free light chain ratio, TSH, free T4.  Ferritin was 191 with a saturation of 13% and a TIBC of 159 (low).  Erythropoietin level was 15.8 (inappropriately low).  24-hour urine on 03/23/2015 for UPEP and free light chains revealed no monoclonal protein and a normal free light chain ratio.  Symptomatically, she is debilitated.  She is a resident at [facility].  Plan: 1.  Discuss lab results from anemia work-up.  Discuss anemia of chronic kidney disease and use of Procrit.   2.  Await preauth for Procrit. 3.  Complete facility form. 4.  RTC next week to initiate Procrit then every 2 weeks. 5.  RTC every 2 weeks for Hgb +/- Procrit (hold for hemoglobin >11). 6.  RTC in 2 months for MD assessment and labs (CBC with diff) +/- Procrit.   [name], MD  04/16/2015, 9:16 AM

## 2015-04-23 ENCOUNTER — Inpatient Hospital Stay: Payer: Medicare Other

## 2015-04-23 ENCOUNTER — Other Ambulatory Visit: Payer: Self-pay | Admitting: Hematology and Oncology

## 2015-04-23 VITALS — BP 134/67 | HR 71 | Temp 97.0°F | Resp 18

## 2015-04-23 DIAGNOSIS — N185 Chronic kidney disease, stage 5: Principal | ICD-10-CM

## 2015-04-23 DIAGNOSIS — I129 Hypertensive chronic kidney disease with stage 1 through stage 4 chronic kidney disease, or unspecified chronic kidney disease: Secondary | ICD-10-CM | POA: Diagnosis not present

## 2015-04-23 DIAGNOSIS — D631 Anemia in chronic kidney disease: Secondary | ICD-10-CM

## 2015-04-23 MED ORDER — EPOETIN ALFA 20000 UNIT/ML IJ SOLN
20000.0000 [IU] | Freq: Once | INTRAMUSCULAR | Status: AC
  Administered 2015-04-23: 20000 [IU] via SUBCUTANEOUS
  Filled 2015-04-23: qty 1

## 2015-04-26 ENCOUNTER — Encounter: Payer: Self-pay | Admitting: Obstetrics and Gynecology

## 2015-04-26 ENCOUNTER — Ambulatory Visit: Payer: Medicare Other | Admitting: Obstetrics and Gynecology

## 2015-04-30 ENCOUNTER — Ambulatory Visit: Payer: Medicare Other

## 2015-04-30 ENCOUNTER — Other Ambulatory Visit: Payer: Medicare Other

## 2015-05-02 ENCOUNTER — Ambulatory Visit: Payer: Medicare Other

## 2015-05-03 ENCOUNTER — Ambulatory Visit
Admission: RE | Admit: 2015-05-03 | Discharge: 2015-05-03 | Disposition: A | Discharge status: Home / Self Care | Payer: Medicare Other | Source: Ambulatory Visit | Attending: Obstetrics and Gynecology | Admitting: Obstetrics and Gynecology

## 2015-05-03 DIAGNOSIS — R82998 Other abnormal findings in urine: Secondary | ICD-10-CM

## 2015-05-03 DIAGNOSIS — R8299 Other abnormal findings in urine: Secondary | ICD-10-CM | POA: Insufficient documentation

## 2015-05-04 ENCOUNTER — Other Ambulatory Visit: Payer: Self-pay | Admitting: Hematology and Oncology

## 2015-05-07 ENCOUNTER — Ambulatory Visit: Payer: Medicare Other

## 2015-05-07 ENCOUNTER — Other Ambulatory Visit: Payer: Medicare Other

## 2015-05-07 ENCOUNTER — Inpatient Hospital Stay: Payer: Medicare Other | Attending: Hematology and Oncology

## 2015-05-07 ENCOUNTER — Inpatient Hospital Stay: Payer: Medicare Other

## 2015-05-07 VITALS — BP 121/65 | HR 74

## 2015-05-07 DIAGNOSIS — D649 Anemia, unspecified: Secondary | ICD-10-CM | POA: Insufficient documentation

## 2015-05-07 DIAGNOSIS — N185 Chronic kidney disease, stage 5: Principal | ICD-10-CM

## 2015-05-07 DIAGNOSIS — I129 Hypertensive chronic kidney disease with stage 1 through stage 4 chronic kidney disease, or unspecified chronic kidney disease: Secondary | ICD-10-CM | POA: Insufficient documentation

## 2015-05-07 DIAGNOSIS — D631 Anemia in chronic kidney disease: Secondary | ICD-10-CM

## 2015-05-07 DIAGNOSIS — N184 Chronic kidney disease, stage 4 (severe): Secondary | ICD-10-CM | POA: Insufficient documentation

## 2015-05-07 LAB — HEMOGLOBIN: Hemoglobin: 9.1 g/dL — ABNORMAL LOW (ref 12.0–16.0)

## 2015-05-07 MED ORDER — EPOETIN ALFA 20000 UNIT/ML IJ SOLN
20000.0000 [IU] | Freq: Once | INTRAMUSCULAR | Status: AC
  Administered 2015-05-07: 20000 [IU] via SUBCUTANEOUS
  Filled 2015-05-07: qty 1

## 2015-05-09 ENCOUNTER — Encounter: Payer: Self-pay | Admitting: Obstetrics and Gynecology

## 2015-05-09 ENCOUNTER — Ambulatory Visit (INDEPENDENT_AMBULATORY_CARE_PROVIDER_SITE_OTHER): Payer: Medicare Other | Admitting: Obstetrics and Gynecology

## 2015-05-09 VITALS — BP 137/83 | HR 78 | Resp 16 | Ht 61.5 in

## 2015-05-09 DIAGNOSIS — N762 Acute vulvitis: Secondary | ICD-10-CM

## 2015-05-09 DIAGNOSIS — N390 Urinary tract infection, site not specified: Secondary | ICD-10-CM | POA: Diagnosis not present

## 2015-05-09 DIAGNOSIS — N952 Postmenopausal atrophic vaginitis: Secondary | ICD-10-CM | POA: Diagnosis not present

## 2015-05-09 DIAGNOSIS — N1330 Unspecified hydronephrosis: Secondary | ICD-10-CM | POA: Diagnosis not present

## 2015-05-09 DIAGNOSIS — R339 Retention of urine, unspecified: Secondary | ICD-10-CM | POA: Diagnosis not present

## 2015-05-09 LAB — MICROSCOPIC EXAMINATION
Epithelial Cells (non renal): NONE SEEN /hpf (ref 0–10)
RBC, UA: NONE SEEN /hpf (ref 0–?)
WBC, UA: >30 /hpf — ABNORMAL HIGH (ref 0–?)

## 2015-05-09 LAB — URINALYSIS, COMPLETE
BILIRUBIN UA: NEGATIVE
KETONES UA: NEGATIVE
Nitrite, UA: POSITIVE — AB
SPEC GRAV UA: 1.025 (ref 1.005–1.030)
Urobilinogen, Ur: 0.2 mg/dL (ref 0.2–1.0)
pH, UA: 5 (ref 5.0–7.5)

## 2015-05-09 MED ORDER — ESTROGENS, CONJUGATED 0.625 MG/GM VA CREA: TOPICAL_CREAM | VAGINAL | Status: DC

## 2015-05-09 MED ORDER — ZINC OXIDE 16 % EX OINT: 1.0000 "application " | TOPICAL_OINTMENT | Freq: Three times a day (TID) | CUTANEOUS | Status: DC | PRN

## 2015-05-09 NOTE — Progress Notes (Signed)
In and Out Catheterization  Patient is present today for a I & O catheterization due to recurrent . Patient was cleaned and prepped in a sterile fashion with betadine and Lidocaine 2% jelly was instilled into the urethra.  A 14FR cath was inserted no complications were noted , 60 ml of urine return was noted, urine was "milky yellow" in color. A clean urine sample was collected for UA and culture. Bladder was drained  And catheter was removed with out difficulty.    Preformed by: Natividad BroodSteve Irish Piech, CMA

## 2015-05-09 NOTE — Progress Notes (Signed)
10:23 AM   Gina Andersen April 27, 1935 [id number]  Referring provider: Nelda Bucks, MD [address] [place], [place] [address]  Chief Complaint  Patient presents with  . Recurrent UTI    HPI: Gina Andersen is a 80 year old hearing impaired female with significant dementia, currently a resident at a skilled nursing facility, presenting today with a staff member from SNF. Significant history of poorly controlled diabetes with neuropathy.  She was recently admitted with urosepsis and acute renal failure. She was found to be in urinary retention with resulting hydroureteronephrosis elevated Creatinine levels. Foley catheter was placed while she was inpatient with subsequent improvement of creatinine levels and hydronephrosis. She was discharge with recommendations for either continuation of indwelling Foley for initiation of CIC.  History ESBL positive urine culture treated with IM Ertapenem.  Our CMA in office spoke with nursing staff at facility and was informed that CIC was attempted at one point but patient became combative with staff and ultimately indwelling Foley was replaced.  Interval History: Since last visit she was seen by Dr. Sampson Goon with infectious disease. At that time she had already had a PICC line placed and was receiving IV antibiotics. Patient presents today with staff member from her nursing facility. She does not have a Foley catheter in place at this time. Patient and staff member unable to provide any further information on patient's voiding status.  Follow up RUS demonstrating bilateral renal lobulated contour. Mild fullness to of the right renal pelvis without frank hydronephrosis. Mild to moderate left hydronephrosis. No renal calculi. Foley catheter within decompressed urinary bladder.  03/19/15 Cr 2.85 02/05/15   Cr 3.06 02/01/15     Cr 2.96  12/2014    Cr 2.79   PMH: Past Medical History  Diagnosis Date  . Hypothyroidism 11/01/1995  . Anxiety 11/01/1995   . Diabetes mellitus type II 05/25/1996    insulin-requiring  . Hyperlipemia 06/25/2002  . Atopic dermatitis   . History of MRI of brain and brain stem 07/10/2005    w amd w/o no mass/infarct small vess dz  . Dehydration     [facility]osp [facility], hyperglycemia and ketoacidosis (stopped insulin)  . Pancytopenia 06/24-7/05/2007    Tissue D/O  . History of gallstones 11/21/2007    Abd U/S multip gallstones contracted GB CBD ULN Tr hydronephr R  . Acute cholecystitis 12/1/-05/20/08    [facility], s/p lap choleycystectomy CTD DM w/hypoglycemic sz in ER  . Gallstones 05/11/08    abd U/S gallstones pericholeycystic fluid [facility]  . Gastro-esophageal reflux disease without esophagitis   . Depression   . Anxiety disorder   . Dysphagia   . Hearing impaired   . Hypertension   . Carpal tunnel syndrome   . Weakness   . Anemia   . Glaucoma   . Mood disorder (HCC)   . Kidney disease     Surgical History: Past Surgical History  Procedure Laterality Date  . Partial hysterectomy      OV intact  . Carpel tunn      right  . Trigger finger release      left  . Ganglionectomy    . Cholecystectomy    . Abdominal hysterectomy      Home Medications:    Medication List       This list is accurate as of: 05/09/15 11:59 PM.  Always use your most recent med list.               bisacodyl 5 MG  EC tablet  Commonly known as:  DULCOLAX  Take 10 mg by mouth daily as needed for moderate constipation.     cholecalciferol 1000 UNITS tablet  Commonly known as:  VITAMIN D  Take 500 Units by mouth daily.     conjugated estrogens vaginal cream  Commonly known as:  PREMARIN  Apply blueberry sized amount to vaginal opening (urethra) nightly x 2 weeks, then 3 times weekly at bedtime     ferrous sulfate 325 (65 FE) MG tablet  Take 1 tablet (325 mg total) by mouth 2 (two) times daily with a meal.     glucagon 1 MG injection  Inject 1 mg into the vein once as needed.     insulin detemir 100 UNIT/ML  injection  Commonly known as:  LEVEMIR  Inject into the skin at bedtime.     insulin lispro 100 UNIT/ML injection  Commonly known as:  HUMALOG  Inject into the skin 3 (three) times daily before meals.     levothyroxine 75 MCG tablet  Commonly known as:  SYNTHROID, LEVOTHROID  Take 1 tablet (75 mcg total) by mouth daily before breakfast.     lidocaine 5 % ointment  Commonly known as:  XYLOCAINE  Apply 1 application topically daily as needed.     LORazepam 0.5 MG tablet  Commonly known as:  ATIVAN  Take 1 tablet (0.5 mg total) by mouth every 6 (six) hours as needed for anxiety.     omeprazole 20 MG capsule  Commonly known as:  PRILOSEC  Take 20 mg by mouth daily.     polyethylene glycol packet  Commonly known as:  MIRALAX / GLYCOLAX  Take 17 g by mouth daily.     risperiDONE 0.25 MG tablet  Commonly known as:  RISPERDAL  Take 0.25 mg by mouth at bedtime.     travoprost (benzalkonium) 0.004 % ophthalmic solution  Commonly known as:  TRAVATAN  Place 1 drop into both eyes daily.     traZODone 50 MG tablet  Commonly known as:  DESYREL  Take 50 mg by mouth at bedtime.     Zinc Oxide 16 % Oint  Commonly known as:  BOUDREAUXS BUTT PASTE  Apply 1 application topically 3 (three) times daily as needed.        Allergies:  Allergies  Allergen Reactions  . Sulfa Antibiotics Other (See Comments)    unknown  . Hydrocodone-Acetaminophen Other (See Comments)    Reaction:  Dizziness   . Lamictal [Lamotrigine] Other (See Comments)    Altered Mental Status  . Lyrica [Pregabalin] Other (See Comments)    Altered Mental Status  . Meloxicam Nausea And Vomiting  . Prednisone Other (See Comments)    Altered Mental Status  . Seroquel [Quetiapine] Other (See Comments)    Altered Mental Status  . Zoloft [Sertraline] Other (See Comments)    Altered Mental Status  . Penicillins Rash    Family History: Family History  Problem Relation Age of Onset  . Kidney disease Mother   .  Diabetes Mother   . Hypertension Mother   . Diabetes Brother   . Stroke Brother   . Stroke Brother     CVA  . Diabetes Brother     Social History:  reports that she has never smoked. She has never used smokeless tobacco. She reports that she does not drink alcohol or use illicit drugs.  ROS:  Physical Exam: BP 137/83 mmHg  Pulse 78  Resp 16  Ht 5' 1.5" (1.562 m)  Wt   Constitutional:  Alert, No acute distress. HEENT: [place] AT, moist mucus membranes.  Trachea midline, no masses. Cardiovascular: No clubbing, cyanosis, or edema. Respiratory: Normal respiratory effort, no increased work of breathing. GI: Abdomen is soft, nontender, nondistended, no abdominal masses GU: No CVA tenderness.  External vaginal demonstrating diffuse erythema, swelling and irritation of the vulva, labia majora and perineal area. Vulva extremely tender to palpation  Skin: No rashes, bruises or suspicious lesions. Lymph: No cervical or inguinal adenopathy. Neurologic: Grossly intact, no focal deficits, moving all 4 extremities, generalized weakness with unsteady gait, patient uses wheelchair and transfers with two-person assistance Psychiatric: Normal mood and affect.  Laboratory Data:   Urinalysis    Component Value Date/Time   COLORURINE YELLOW* 03/20/2015 2258   COLORURINE Yellow 05/29/2014 1432   APPEARANCEUR TURBID* 03/20/2015 2258   APPEARANCEUR Hazy 05/29/2014 1432   LABSPEC 1.010 03/20/2015 2258   LABSPEC 1.017 05/29/2014 1432   PHURINE 5.0 03/20/2015 2258   PHURINE 5.0 05/29/2014 1432   GLUCOSEU 3+* 05/09/2015 1450   GLUCOSEU >=500 05/29/2014 1432   HGBUR 1+* 03/20/2015 2258   HGBUR 1+ 05/29/2014 1432   BILIRUBINUR Negative 05/09/2015 1450   BILIRUBINUR NEGATIVE 03/20/2015 2258   BILIRUBINUR Negative 05/29/2014 1432   KETONESUR NEGATIVE 03/20/2015 2258   KETONESUR Trace 05/29/2014 1432   PROTEINUR 100* 03/20/2015 2258    PROTEINUR 30 mg/dL 40/98/119101/09/2014 47821432   UROBILINOGEN 0.2 12/11/2012 1906   NITRITE Positive* 05/09/2015 1450   NITRITE NEGATIVE 03/20/2015 2258   NITRITE Negative 05/29/2014 1432   LEUKOCYTESUR 3+* 05/09/2015 1450   LEUKOCYTESUR 3+* 03/20/2015 2258   LEUKOCYTESUR 2+ 05/29/2014 1432    Pertinent Imaging: Pertinent labs & imaging results that were available during my care of the patient were reviewed by me and considered in my medical decision making (see chart for details).  CLINICAL DATA: Abnormal urine creatinine, history of bilateral hydronephrosis  EXAM: RENAL / URINARY TRACT ULTRASOUND COMPLETE  COMPARISON: 01/14/2015  FINDINGS: Right Kidney:  Length: 10.4 cm. Mild lobular renal contour. Mild fullness of the right renal pelvis without frank hydronephrosis.  Left Kidney:  Length: 9.2 cm. Normal echogenicity. Mild to moderate left hydronephrosis. Mild lobular cortical prominence midpole probable dromedary hump without significant change from prior exam.  No renal calculi are noted bilaterally.  Bladder:  There is Foley catheter within decompressed urinary bladder.  IMPRESSION: 1. Bilateral renal lobulated contour. Mild fullness to of the right renal pelvis without frank hydronephrosis. Mild to moderate left hydronephrosis. No renal calculi. Foley catheter within decompressed urinary bladder. Electronically Signed  By: Natasha MeadLiviu Pop M.D.  On: 05/03/2015 16:21   Assessment & Plan:    1. Recurrent UTI- History of ESBL infection. Catheterized specimen obtained today.  - Urinalysis, Complete - BLADDER SCAN AMB NON-IMAGING  2. 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 Foley has been removed since last visit. Patient and nurse aid unable to provide further information. I am assuming patient has been voiding well on her own. Catheterized specimen obtained today and bladder was emptied without excessive urine output.   Orders written for continued monitoring for urinary retention.  3. Bilateral Hydronephrosis- Repeat renal ultrasound with Foley catheter in place showing resolution of previous right hydronephrosis but continued mild to moderate left hydronephrosis. CT ordered for further evaluation. Unable to use IV contrast due to patient's decreased renal function.  4. Vulvar Irritation- Significant  vulvar and perineal irritation, erythema and tenderness upon examination today. Specific instructions written for good perineal hygiene as well as application of inferior cream prescribed prednisone patient's paperwork to return to her skilled nursing facility.  5. Vaginal atrophy- Orders and prescription written for administration of vaginal estrogen cream to be started in 2 weeks once patient's vulvar irritation has improved. This will hopefully help prevent further urinary tract infections.  6. Elevated Serum Creatinine- Creatinine remains elevated despite continued indwelling Foley. We'll continue to monitor. Patient may need another nephrology consultation.  03/19/15 Cr 2.85 02/04/15 Cr 2.96 01/18/15 Cr 2.79 01/17/15 Cr 3.10   Return for f/u for CT results.  These notes generated with voice recognition software. I apologize for typographical errors.  Earlie Lou, FNP  [facility] [address] [place], [place] [address] [phone]

## 2015-05-12 LAB — CULTURE, URINE COMPREHENSIVE

## 2015-05-14 ENCOUNTER — Telehealth: Payer: Self-pay

## 2015-05-14 NOTE — Telephone Encounter (Signed)
Spoke with Caroline, nurse of pt, in [place] to +ucx. [place] CitizenCaroline stated she would obtain another specimen and send for ucx. [place] CitizenCaroline also stated she would have results faxed over to [facility].

## 2015-05-14 NOTE — Telephone Encounter (Signed)
-----   Message from Fernanda DrumLindsay C Overton, FNP sent at 05/14/2015  8:47 AM EST ----- Please notify patient's nursing facility that her urine culture did grow out a significant amount of bacteria though there were multiple organisms recovered and susceptibilities were not able to be performed. We please write an order for them to obtain another urine specimen to be sent for culture and have results forwarded to us if possible. Thanks

## 2015-05-16 ENCOUNTER — Ambulatory Visit
Admission: RE | Admit: 2015-05-16 | Discharge: 2015-05-16 | Disposition: A | Discharge status: Home / Self Care | Payer: Medicare Other | Source: Ambulatory Visit | Attending: Obstetrics and Gynecology | Admitting: Obstetrics and Gynecology

## 2015-05-16 DIAGNOSIS — N3289 Other specified disorders of bladder: Secondary | ICD-10-CM | POA: Diagnosis not present

## 2015-05-16 DIAGNOSIS — K4090 Unilateral inguinal hernia, without obstruction or gangrene, not specified as recurrent: Secondary | ICD-10-CM | POA: Insufficient documentation

## 2015-05-16 DIAGNOSIS — N390 Urinary tract infection, site not specified: Secondary | ICD-10-CM | POA: Insufficient documentation

## 2015-05-16 DIAGNOSIS — N1330 Unspecified hydronephrosis: Secondary | ICD-10-CM | POA: Insufficient documentation

## 2015-05-21 ENCOUNTER — Other Ambulatory Visit: Payer: Self-pay | Admitting: Hematology and Oncology

## 2015-05-21 ENCOUNTER — Ambulatory Visit: Payer: Medicare Other | Admitting: Obstetrics and Gynecology

## 2015-05-22 ENCOUNTER — Inpatient Hospital Stay: Payer: Medicare Other

## 2015-05-23 ENCOUNTER — Ambulatory Visit: Payer: Medicare Other

## 2015-05-23 ENCOUNTER — Inpatient Hospital Stay: Payer: Medicare Other

## 2015-05-23 ENCOUNTER — Other Ambulatory Visit: Payer: Self-pay | Admitting: Hematology and Oncology

## 2015-05-23 DIAGNOSIS — N185 Chronic kidney disease, stage 5: Principal | ICD-10-CM

## 2015-05-23 DIAGNOSIS — D631 Anemia in chronic kidney disease: Secondary | ICD-10-CM

## 2015-05-23 DIAGNOSIS — D649 Anemia, unspecified: Secondary | ICD-10-CM | POA: Diagnosis not present

## 2015-05-23 LAB — HEMOGLOBIN: Hemoglobin: 10.7 g/dL — ABNORMAL LOW (ref 12.0–16.0)

## 2015-05-28 ENCOUNTER — Encounter: Payer: Self-pay | Admitting: Obstetrics and Gynecology

## 2015-05-28 ENCOUNTER — Ambulatory Visit (INDEPENDENT_AMBULATORY_CARE_PROVIDER_SITE_OTHER): Payer: Medicare Other | Admitting: Obstetrics and Gynecology

## 2015-05-28 VITALS — BP 137/75 | HR 68 | Resp 18

## 2015-05-28 DIAGNOSIS — R339 Retention of urine, unspecified: Secondary | ICD-10-CM

## 2015-05-28 DIAGNOSIS — N1339 Other hydronephrosis: Secondary | ICD-10-CM

## 2015-05-28 DIAGNOSIS — R8271 Bacteriuria: Secondary | ICD-10-CM

## 2015-05-28 DIAGNOSIS — N289 Disorder of kidney and ureter, unspecified: Secondary | ICD-10-CM

## 2015-05-28 LAB — BLADDER SCAN AMB NON-IMAGING: SCAN RESULT: 195

## 2015-05-28 NOTE — Progress Notes (Signed)
9:00 AM   Gina Andersen 01-10-1935 [id number]  Referring provider: Nelda Bucks, MD [address] [place], [place] [address]  Chief Complaint  Patient presents with  . Results    CT  . Recurrent UTI    HPI: Gina Andersen is a 81 year old hearing impaired female with significant dementia, Stage 5 kidney disease, currently a resident at a skilled nursing facility, presenting today with a staff member from SNF. Significant history of poorly controlled diabetes with neuropathy.  She was recently admitted with urosepsis and acute renal failure. She was found to be in urinary retention with resulting hydroureteronephrosis elevated Creatinine levels. Foley catheter was placed while she was inpatient with subsequent improvement of creatinine levels and hydronephrosis. She was discharge with recommendations for either continuation of indwelling Foley for initiation of CIC.  History ESBL positive urine culture treated with IM Ertapenem.  Our CMA in office spoke with nursing staff at facility and was informed that CIC was attempted at one point but patient became combative with staff and ultimately indwelling Foley was replaced.  Interval History: Since last visit she was seen by Dr. Sampson Goon with infectious disease. At that time she had already had a PICC line placed and was receiving IV antibiotics. Patient presents today with staff member from her nursing facility. She does not have a Foley catheter in place at this time. Patient and staff member unable to provide any further information on patient's voiding status.  Follow up RUS demonstrating bilateral renal lobulated contour. Mild fullness to of the right renal pelvis without frank hydronephrosis. Mild to moderate left hydronephrosis. No renal calculi. Foley catheter within decompressed urinary bladder.  03/19/15 Cr 2.85 02/05/15   Cr 3.06 02/01/15     Cr 2.96  12/2014    Cr 2.79   PMH: Past Medical History  Diagnosis Date  .  Hypothyroidism 11/01/1995  . Anxiety 11/01/1995  . Diabetes mellitus type II 05/25/1996    insulin-requiring  . Hyperlipemia 06/25/2002  . Atopic dermatitis   . History of MRI of brain and brain stem 07/10/2005    w amd w/o no mass/infarct small vess dz  . Dehydration     Hosp [facility], hyperglycemia and ketoacidosis (stopped insulin)  . Pancytopenia 06/24-7/05/2007    Tissue D/O  . History of gallstones 11/21/2007    Abd U/S multip gallstones contracted GB CBD ULN Tr hydronephr R  . Acute cholecystitis 12/1/-05/20/08    [facility], s/p lap choleycystectomy CTD DM w/hypoglycemic sz in ER  . Gallstones 05/11/08    abd U/S gallstones pericholeycystic fluid [facility]  . Gastro-esophageal reflux disease without esophagitis   . Depression   . Anxiety disorder   . Dysphagia   . Hearing impaired   . Hypertension   . Carpal tunnel syndrome   . Weakness   . Anemia   . Glaucoma   . Mood disorder (HCC)   . Kidney disease     Surgical History: Past Surgical History  Procedure Laterality Date  . Partial hysterectomy      OV intact  . Carpel tunn      right  . Trigger finger release      left  . Ganglionectomy    . Cholecystectomy    . Abdominal hysterectomy      Home Medications:    Medication List       This list is accurate as of: 05/28/15  9:00 AM.  Always use your most recent med list.  bisacodyl 5 MG EC tablet  Commonly known as:  DULCOLAX  Take 10 mg by mouth daily as needed for moderate constipation.     cholecalciferol 1000 units tablet  Commonly known as:  VITAMIN D  Take 500 Units by mouth daily.     conjugated estrogens vaginal cream  Commonly known as:  PREMARIN  Apply blueberry sized amount to vaginal opening (urethra) nightly x 2 weeks, then 3 times weekly at bedtime     ferrous sulfate 325 (65 FE) MG tablet  Take 1 tablet (325 mg total) by mouth 2 (two) times daily with a meal.     glucagon 1 MG injection  Inject 1 mg into the vein once as  needed.     insulin detemir 100 UNIT/ML injection  Commonly known as:  LEVEMIR  Inject into the skin at bedtime.     insulin lispro 100 UNIT/ML injection  Commonly known as:  HUMALOG  Inject into the skin 3 (three) times daily before meals.     levothyroxine 75 MCG tablet  Commonly known as:  SYNTHROID, LEVOTHROID  Take 1 tablet (75 mcg total) by mouth daily before breakfast.     lidocaine 5 % ointment  Commonly known as:  XYLOCAINE  Apply 1 application topically daily as needed.     LORazepam 0.5 MG tablet  Commonly known as:  ATIVAN  Take 1 tablet (0.5 mg total) by mouth every 6 (six) hours as needed for anxiety.     omeprazole 20 MG capsule  Commonly known as:  PRILOSEC  Take 20 mg by mouth daily.     polyethylene glycol packet  Commonly known as:  MIRALAX / GLYCOLAX  Take 17 g by mouth daily.     risperiDONE 0.25 MG tablet  Commonly known as:  RISPERDAL  Take 0.25 mg by mouth at bedtime.     travoprost (benzalkonium) 0.004 % ophthalmic solution  Commonly known as:  TRAVATAN  Place 1 drop into both eyes daily.     traZODone 50 MG tablet  Commonly known as:  DESYREL  Take 50 mg by mouth at bedtime.     Zinc Oxide 16 % Oint  Commonly known as:  BOUDREAUXS BUTT PASTE  Apply 1 application topically 3 (three) times daily as needed.        Allergies:  Allergies  Allergen Reactions  . Sulfa Antibiotics Other (See Comments)    unknown  . Hydrocodone-Acetaminophen Other (See Comments)    Reaction:  Dizziness   . Lamictal [Lamotrigine] Other (See Comments)    Altered Mental Status  . Lyrica [Pregabalin] Other (See Comments)    Altered Mental Status  . Meloxicam Nausea And Vomiting  . Prednisone Other (See Comments)    Altered Mental Status  . Seroquel [Quetiapine] Other (See Comments)    Altered Mental Status  . Zoloft [Sertraline] Other (See Comments)    Altered Mental Status  . Penicillins Rash    Family History: Family History  Problem Relation Age  of Onset  . Kidney disease Mother   . Diabetes Mother   . Hypertension Mother   . Diabetes Brother   . Stroke Brother   . Stroke Brother     CVA  . Diabetes Brother     Social History:  reports that she has never smoked. She has never used smokeless tobacco. She reports that she does not drink alcohol or use illicit drugs.  ROS: UROLOGY Frequent Urination?: No Hard to postpone urination?: No Burning/pain with urination?:  No Get up at night to urinate?: No Leakage of urine?: No Urine stream starts and stops?: No Trouble starting stream?: No Do you have to strain to urinate?: No Blood in urine?: No Urinary tract infection?: No Sexually transmitted disease?: No Injury to kidneys or bladder?: No Painful intercourse?: No Weak stream?: No Currently pregnant?: No Vaginal bleeding?: No Last menstrual period?: n  Gastrointestinal Nausea?: No Vomiting?: No Indigestion/heartburn?: No Diarrhea?: Yes Constipation?: No  Constitutional Fever: No Night sweats?: No Weight loss?: Yes Fatigue?: No  Skin Skin rash/lesions?: No Itching?: Yes  Eyes Blurred vision?: No Double vision?: Yes  Ears/Nose/Throat Sore throat?: No Sinus problems?: No  Hematologic/Lymphatic Swollen glands?: No Easy bruising?: Yes  Cardiovascular Leg swelling?: No Chest pain?: No  Respiratory Cough?: Yes Shortness of breath?: No  Endocrine Excessive thirst?: Yes  Musculoskeletal Back pain?: No Joint pain?: No  Neurological Headaches?: Yes Dizziness?: No  Psychologic Depression?: Yes Anxiety?: No  Physical Exam: BP 137/75 mmHg  Pulse 68  Resp 18  Constitutional:  Alert, No acute distress. HEENT: [place] AT, moist mucus membranes.  Trachea midline, no masses. Cardiovascular: No clubbing, cyanosis, or edema. Respiratory: Normal respiratory effort, no increased work of breathing. GI: Abdomen is soft, nontender, nondistended, no abdominal masses GU: No CVA tenderness.  External  vaginal demonstrating diffuse erythema, swelling and irritation of the vulva, labia majora and perineal area. Vulva extremely tender to palpation  Skin: No rashes, bruises or suspicious lesions. Lymph: No cervical or inguinal adenopathy. Neurologic: Grossly intact, no focal deficits, moving all 4 extremities, generalized weakness with unsteady gait, patient uses wheelchair and transfers with two-person assistance Psychiatric: Normal mood and affect.  Laboratory Data:   Urinalysis    Component Value Date/Time   COLORURINE YELLOW* 03/20/2015 2258   COLORURINE Yellow 05/29/2014 1432   APPEARANCEUR TURBID* 03/20/2015 2258   APPEARANCEUR Hazy 05/29/2014 1432   LABSPEC 1.010 03/20/2015 2258   LABSPEC 1.017 05/29/2014 1432   PHURINE 5.0 03/20/2015 2258   PHURINE 5.0 05/29/2014 1432   GLUCOSEU 3+* 05/09/2015 1450   GLUCOSEU >=500 05/29/2014 1432   HGBUR 1+* 03/20/2015 2258   HGBUR 1+ 05/29/2014 1432   BILIRUBINUR Negative 05/09/2015 1450   BILIRUBINUR NEGATIVE 03/20/2015 2258   BILIRUBINUR Negative 05/29/2014 1432   KETONESUR NEGATIVE 03/20/2015 2258   KETONESUR Trace 05/29/2014 1432   PROTEINUR 100* 03/20/2015 2258   PROTEINUR 30 mg/dL 29/56/213001/09/2014 [id number]   UROBILINOGEN 0.2 12/11/2012 1906   NITRITE Positive* 05/09/2015 1450   NITRITE NEGATIVE 03/20/2015 2258   NITRITE Negative 05/29/2014 1432   LEUKOCYTESUR 3+* 05/09/2015 1450   LEUKOCYTESUR 3+* 03/20/2015 2258   LEUKOCYTESUR 2+ 05/29/2014 1432    Pertinent Imaging: Pertinent labs & imaging results that were available during my care of the patient were reviewed by me and considered in my medical decision making (see chart for details).  CLINICAL DATA: Abnormal urine creatinine, history of bilateral hydronephrosis  EXAM: RENAL / URINARY TRACT ULTRASOUND COMPLETE  COMPARISON: 01/14/2015  FINDINGS: Right Kidney:  Length: 10.4 cm. Mild lobular renal contour. Mild fullness of the right renal pelvis without frank  hydronephrosis.  Left Kidney:  Length: 9.2 cm. Normal echogenicity. Mild to moderate left hydronephrosis. Mild lobular cortical prominence midpole probable dromedary hump without significant change from prior exam.  No renal calculi are noted bilaterally.  Bladder:  There is Foley catheter within decompressed urinary bladder.  IMPRESSION: 1. Bilateral renal lobulated contour. Mild fullness to of the right renal pelvis without frank hydronephrosis. Mild to moderate left hydronephrosis. No  renal calculi. Foley catheter within decompressed urinary bladder. Electronically Signed  By: Natasha Mead M.D.  On: 05/03/2015 16:21   Assessment & Plan:    1. Recurrent UTI- History of ESBL infection. Catheterized specimen obtained today. Only treat if symptomatic. - Urinalysis, Complete - BLADDER SCAN AMB NON-IMAGING  2. 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 Foley has been removed since last visit. Orders written for continued monitoring for urinary retention. CIC in am and pm.  3. Bilateral Hydronephrosis- Repeat renal ultrasound with Foley catheter in place showing resolution of previous right hydronephrosis but continued mild to moderate left hydronephrosis. CT ordered for further evaluation. Unable to use IV contrast due to patient's decreased renal function. RUS 6 months  4. Vulvar Irritation- Significant vulvar and perineal irritation, erythema and tenderness upon examination today. Specific instructions written for good perineal hygiene as well as application of inferior cream prescribed prednisone patient's paperwork to return to her skilled nursing facility.  5. Vaginal atrophy- Orders and prescription written for administration of vaginal estrogen cream to be started in 2 weeks once patient's vulvar irritation has improved. This will hopefully help prevent further urinary tract infections.  6. Elevated Serum Creatinine- BMP checked  today. 03/19/15 Cr 2.85 02/04/15 Cr 2.96 01/18/15 Cr 2.79 01/17/15 Cr 3.10   No Follow-up on file.  These notes generated with voice recognition software. I apologize for typographical errors.  Earlie Lou, FNP  [facility] [address] [place], [place] [address] [phone]

## 2015-05-29 ENCOUNTER — Other Ambulatory Visit: Payer: Self-pay | Admitting: Obstetrics and Gynecology

## 2015-05-29 DIAGNOSIS — R339 Retention of urine, unspecified: Secondary | ICD-10-CM

## 2015-05-29 DIAGNOSIS — N1330 Unspecified hydronephrosis: Secondary | ICD-10-CM

## 2015-05-29 LAB — BASIC METABOLIC PANEL
BUN/Creatinine Ratio: 23 (ref 11–26)
BUN: 70 mg/dL — AB (ref 8–27)
CO2: 16 mmol/L — AB (ref 18–29)
CREATININE: 3.1 mg/dL — AB (ref 0.57–1.00)
Calcium: 9.1 mg/dL (ref 8.7–10.3)
Chloride: 102 mmol/L (ref 96–106)
GFR calc Af Amer: 16 mL/min/{1.73_m2} — ABNORMAL LOW (ref >59)
GFR calc non Af Amer: 14 mL/min/{1.73_m2} — ABNORMAL LOW (ref >59)
GLUCOSE: 237 mg/dL — AB (ref 65–99)
Potassium: 4.6 mmol/L (ref 3.5–5.2)
Sodium: 136 mmol/L (ref 134–144)

## 2015-05-30 ENCOUNTER — Telehealth: Payer: Self-pay

## 2015-05-30 NOTE — Telephone Encounter (Signed)
Spoke with pt daughter in reference to VCUG. Daughter voiced understanding.  Sp[facility] with [facility] and made aware of VCUG testing and needing a f/u appt. Gavin PoundDeborah stated she would let the nurse know.

## 2015-05-30 NOTE — Telephone Encounter (Signed)
-----   Message from Fernanda DrumLindsay C Overton, FNP sent at 05/29/2015  1:52 PM EST ----- Please notify patient, family member or nursing staff that her kidney function levels are worsening.  I would like for her to have a VCUG.  I placed the order and it will need to be scheduled. She will need to be scheduled for follow up to review results.  Thanks

## 2015-06-04 ENCOUNTER — Other Ambulatory Visit: Payer: Self-pay | Admitting: Hematology and Oncology

## 2015-06-05 ENCOUNTER — Inpatient Hospital Stay: Payer: Medicare Other | Attending: Hematology and Oncology

## 2015-06-05 ENCOUNTER — Inpatient Hospital Stay: Payer: Medicare Other

## 2015-06-05 VITALS — BP 135/68 | HR 80 | Resp 20

## 2015-06-05 DIAGNOSIS — F418 Other specified anxiety disorders: Secondary | ICD-10-CM | POA: Diagnosis not present

## 2015-06-05 DIAGNOSIS — Z794 Long term (current) use of insulin: Secondary | ICD-10-CM | POA: Insufficient documentation

## 2015-06-05 DIAGNOSIS — E039 Hypothyroidism, unspecified: Secondary | ICD-10-CM | POA: Diagnosis not present

## 2015-06-05 DIAGNOSIS — R5381 Other malaise: Secondary | ICD-10-CM | POA: Insufficient documentation

## 2015-06-05 DIAGNOSIS — K219 Gastro-esophageal reflux disease without esophagitis: Secondary | ICD-10-CM | POA: Insufficient documentation

## 2015-06-05 DIAGNOSIS — K649 Unspecified hemorrhoids: Secondary | ICD-10-CM | POA: Insufficient documentation

## 2015-06-05 DIAGNOSIS — E785 Hyperlipidemia, unspecified: Secondary | ICD-10-CM | POA: Insufficient documentation

## 2015-06-05 DIAGNOSIS — N185 Chronic kidney disease, stage 5: Principal | ICD-10-CM

## 2015-06-05 DIAGNOSIS — E1122 Type 2 diabetes mellitus with diabetic chronic kidney disease: Secondary | ICD-10-CM | POA: Insufficient documentation

## 2015-06-05 DIAGNOSIS — D631 Anemia in chronic kidney disease: Secondary | ICD-10-CM | POA: Diagnosis not present

## 2015-06-05 DIAGNOSIS — I120 Hypertensive chronic kidney disease with stage 5 chronic kidney disease or end stage renal disease: Secondary | ICD-10-CM | POA: Diagnosis present

## 2015-06-05 DIAGNOSIS — Z79899 Other long term (current) drug therapy: Secondary | ICD-10-CM | POA: Insufficient documentation

## 2015-06-05 LAB — HEMOGLOBIN: Hemoglobin: 9.8 g/dL — ABNORMAL LOW (ref 12.0–16.0)

## 2015-06-05 MED ORDER — EPOETIN ALFA 20000 UNIT/ML IJ SOLN
20000.0000 [IU] | Freq: Once | INTRAMUSCULAR | Status: AC
  Administered 2015-06-05: 20000 [IU] via SUBCUTANEOUS
  Filled 2015-06-05: qty 1

## 2015-06-10 ENCOUNTER — Encounter: Payer: Self-pay | Admitting: Podiatry

## 2015-06-10 ENCOUNTER — Ambulatory Visit (INDEPENDENT_AMBULATORY_CARE_PROVIDER_SITE_OTHER): Payer: Medicare Other | Admitting: Podiatry

## 2015-06-10 ENCOUNTER — Other Ambulatory Visit: Payer: Self-pay | Admitting: Obstetrics and Gynecology

## 2015-06-10 DIAGNOSIS — M79671 Pain in right foot: Secondary | ICD-10-CM

## 2015-06-10 DIAGNOSIS — N1339 Other hydronephrosis: Secondary | ICD-10-CM

## 2015-06-10 DIAGNOSIS — M79672 Pain in left foot: Secondary | ICD-10-CM | POA: Diagnosis not present

## 2015-06-10 DIAGNOSIS — B351 Tinea unguium: Secondary | ICD-10-CM

## 2015-06-10 NOTE — Progress Notes (Signed)
She presents today with chief complaint of ingrown toenail hallux right also thick yellow dystrophic onychomycotic nails which are painful.  Objective: Vital signs are stable. She is alert and oriented. She knows me. His heart understand because of her speech impediment. Her toenails are thick yellow dystrophic clinic mycotic and painful palpation hallux right does demonstrate a sharp incurvated nail margin with mild erythema no purulence no malodor just tenderness.  Assessment: Pain in limb second onychomycosis. Mild paronychia hallux right.  Plan: Debridement of her toenails bilateral. Follow up with her in 3 months.

## 2015-06-17 ENCOUNTER — Inpatient Hospital Stay (HOSPITAL_BASED_OUTPATIENT_CLINIC_OR_DEPARTMENT_OTHER): Payer: Medicare Other | Admitting: Hematology and Oncology

## 2015-06-17 VITALS — BP 163/83 | HR 83 | Temp 97.3°F | Resp 18 | Ht 61.5 in

## 2015-06-17 DIAGNOSIS — R5381 Other malaise: Secondary | ICD-10-CM

## 2015-06-17 DIAGNOSIS — D631 Anemia in chronic kidney disease: Secondary | ICD-10-CM

## 2015-06-17 DIAGNOSIS — Z79899 Other long term (current) drug therapy: Secondary | ICD-10-CM

## 2015-06-17 DIAGNOSIS — D649 Anemia, unspecified: Secondary | ICD-10-CM

## 2015-06-17 DIAGNOSIS — I120 Hypertensive chronic kidney disease with stage 5 chronic kidney disease or end stage renal disease: Secondary | ICD-10-CM

## 2015-06-17 DIAGNOSIS — Z794 Long term (current) use of insulin: Secondary | ICD-10-CM

## 2015-06-17 DIAGNOSIS — K649 Unspecified hemorrhoids: Secondary | ICD-10-CM

## 2015-06-17 DIAGNOSIS — K219 Gastro-esophageal reflux disease without esophagitis: Secondary | ICD-10-CM

## 2015-06-17 DIAGNOSIS — E785 Hyperlipidemia, unspecified: Secondary | ICD-10-CM

## 2015-06-17 DIAGNOSIS — N185 Chronic kidney disease, stage 5: Secondary | ICD-10-CM

## 2015-06-17 DIAGNOSIS — E1122 Type 2 diabetes mellitus with diabetic chronic kidney disease: Secondary | ICD-10-CM | POA: Diagnosis not present

## 2015-06-17 DIAGNOSIS — F418 Other specified anxiety disorders: Secondary | ICD-10-CM

## 2015-06-17 DIAGNOSIS — E039 Hypothyroidism, unspecified: Secondary | ICD-10-CM

## 2015-06-17 NOTE — Progress Notes (Signed)
[facility]  Clinic day:  06/17/2015  Chief Complaint: Gina Andersen is a 81 y.o. female with stage V chronic kidney disease and anemia who is seen for 2 month assessment on Procrit.  HPI: The patient was seen in the medical oncology clinic on 04/16/2015.  At that time, she was seen for review of initial work-up.  Work-up confirmed anemia secondary to chronic kidney disease.  Preauth was pending for initiation of Procrit.  She began Procrit on 04/23/2015.  Prior to initiation, hematocrit was 24.7 with a hemoglobin of 8.1 on 03/19/2015.  Hemoglobin was 9.1 on 05/07/2015 and 9.8 on 06/05/2015.  She has received Procrit 20,000 units a total of 3 times since her last visit.  Symptomatically, she voices no concerns.  She notes a history of hemorrhoidal bleeding which is "all right now".  She has a voiding cystogram scheduled for tomorrow.    Past Medical History  Diagnosis Date  . Hypothyroidism 11/01/1995  . Anxiety 11/01/1995  . Diabetes mellitus type II 05/25/1996    insulin-requiring  . Hyperlipemia 06/25/2002  . Atopic dermatitis   . History of MRI of brain and brain stem 07/10/2005    w amd w/o no mass/infarct small vess dz  . Dehydration     Hosp [facility], hyperglycemia and ketoacidosis (stopped insulin)  . Pancytopenia 06/24-7/05/2007    Tissue D/O  . History of gallstones 11/21/2007    Abd U/S multip gallstones contracted GB CBD ULN Tr hydronephr R  . Acute cholecystitis 12/1/-05/20/08    [facility], s/p lap choleycystectomy CTD DM w/hypoglycemic sz in ER  . Gallstones 05/11/08    abd U/S gallstones pericholeycystic fluid [facility]  . Gastro-esophageal reflux disease without esophagitis   . Depression   . Anxiety disorder   . Dysphagia   . Hearing impaired   . Hypertension   . Carpal tunnel syndrome   . Weakness   . Anemia   . Glaucoma   . Mood disorder (HCC)   . Kidney disease     Past Surgical History  Procedure Laterality Date  .  Partial hysterectomy      OV intact  . Carpel tunn      right  . Trigger finger release      left  . Ganglionectomy    . Cholecystectomy    . Abdominal hysterectomy      Family History  Problem Relation Age of Onset  . Kidney disease Mother   . Diabetes Mother   . Hypertension Mother   . Diabetes Brother   . Stroke Brother   . Stroke Brother     CVA  . Diabetes Brother     Social History:  reports that she has never smoked. She has never used smokeless tobacco. She reports that she does not drink alcohol or use illicit drugs.  She used to sign for the deaf.  The patient is accompanied by a Stanton Kidney from [facility] today.  Allergies:  Allergies  Allergen Reactions  . Sulfa Antibiotics Other (See Comments)    unknown  . Hydrocodone-Acetaminophen Other (See Comments)    Reaction:  Dizziness   . Lamictal [Lamotrigine] Other (See Comments)    Altered Mental Status  . Lyrica [Pregabalin] Other (See Comments)    Altered Mental Status  . Meloxicam Nausea And Vomiting  . Prednisone Other (See Comments)    Altered Mental Status  . Seroquel [Quetiapine] Other (See Comments)    Altered Mental Status  .  Zoloft [Sertraline] Other (See Comments)    Altered Mental Status  . Penicillins Rash    Current Medications: Current Outpatient Prescriptions  Medication Sig Dispense Refill  . bisacodyl (DULCOLAX) 5 MG EC tablet Take 10 mg by mouth daily as needed for moderate constipation.    . cholecalciferol (VITAMIN D) 1000 UNITS tablet Take 500 Units by mouth daily.    Marland Kitchen conjugated estrogens (PREMARIN) vaginal cream Apply blueberry sized amount to vaginal opening (urethra) nightly x 2 weeks, then 3 times weekly at bedtime 42.5 g 12  . ferrous sulfate 325 (65 FE) MG tablet Take 1 tablet (325 mg total) by mouth 2 (two) times daily with a meal.  3  . glucagon 1 MG injection Inject 1 mg into the vein once as needed.    . insulin detemir (LEVEMIR) 100 UNIT/ML injection Inject into  the skin at bedtime.    . insulin lispro (HUMALOG) 100 UNIT/ML injection Inject into the skin 3 (three) times daily before meals.    Marland Kitchen levothyroxine (SYNTHROID, LEVOTHROID) 75 MCG tablet Take 1 tablet (75 mcg total) by mouth daily before breakfast.    . lidocaine (XYLOCAINE) 5 % ointment Apply 1 application topically daily as needed.    Marland Kitchen LORazepam (ATIVAN) 0.5 MG tablet Take 1 tablet (0.5 mg total) by mouth every 6 (six) hours as needed for anxiety. 30 tablet 0  . omeprazole (PRILOSEC) 20 MG capsule Take 20 mg by mouth daily.    . polyethylene glycol (MIRALAX / GLYCOLAX) packet Take 17 g by mouth daily.    . risperiDONE (RISPERDAL) 0.25 MG tablet Take 0.25 mg by mouth at bedtime.    . travoprost, benzalkonium, (TRAVATAN) 0.004 % ophthalmic solution Place 1 drop into both eyes daily.     . traZODone (DESYREL) 50 MG tablet Take 50 mg by mouth at bedtime.    . Zinc Oxide (BOUDREAUXS BUTT PASTE) 16 % OINT Apply 1 application topically 3 (three) times daily as needed. 1 Tube 3   No current facility-administered medications for this visit.    Review of Systems:  GENERAL:  Minimally active.  No fevers or sweats.  No new weight. PERFORMANCE STATUS (ECOG):  3 HEENT:  No visual changes, runny nose, sore throat, mouth sores or tenderness. Lungs: No shortness of breath or cough.  No hemoptysis. Cardiac:  No chest pain, palpitations, orthopnea, or PND. GI:  Hemorrhoids.  No nausea, vomiting, diarrhea, constipation, melena or hematochezia. GU:  Urology evaluation underway.  No urgency, frequency, dysuria, or hematuria. Musculoskeletal:  No back pain.  No joint pain.  No muscle tenderness. Extremities:  No pain or swelling. Skin:  No rashes or skin changes. Neuro:  No headache, numbness or weakness, balance or coordination issues. Endocrine:  Diabetes.  Hypothyroid on supplimentation.  No hot flashes or night sweats. Psych:  Anxiety.  No mood changes or depression. Pain:  No focal pain. Review of  systems:  All other systems reviewed and found to be negative.  Physical Exam: Blood pressure 163/83, pulse 83, temperature 97.3 F (36.3 C), temperature source Tympanic, resp. rate 18, height 5' 1.5" (1.562 m). GENERAL:  Elderly woman sitting comfortably in a wheelchair in the exam room in no acute distress. MENTAL STATUS:  Verbal.  Alert to person and place. HEAD:  Short gray hair.  Temporal wasting.  Normocephalic, atraumatic, face symmetric, no Cushingoid features. EYES:  Glasses.  Blue eyes.  Pupils equal round and reactive to light and accomodation.  No conjunctivitis or scleral icterus. ENT:  Oropharynx clear without lesion.  Edentulous.  Tongue normal. Mucous membranes moist.  RESPIRATORY:  Poor respiratory excursion.  Clear to auscultation without rales, wheezes or rhonchi. CARDIOVASCULAR:  Regular rate and rhythm without murmur, rub or gallop. ABDOMEN:  Soft, non-tender, with active bowel sounds, and no hepatosplenomegaly.  No guarding or rebound.  No masses. SKIN:  No rashes, ulcers or lesions. EXTREMITIES: Lower extremities thin.  No edema, no skin discoloration or tenderness.  No palpable cords. LYMPH NODES: No palpable cervical, supraclavicular, axillary or inguinal adenopathy  NEUROLOGICAL: Unremarkable. PSYCH:  Appropriate.  No visits with results within 3 Day(s) from this visit. Latest known visit with results is:  Appointment on 06/05/2015  Component Date Value Ref Range Status  . Hemoglobin 06/05/2015 9.8* 12.0 - 16.0 g/dL Final    Assessment:  TAHNEE CIFUENTES is a 81 y.o. female with a normocytic anemia secondary to anemia of chronic renal disease (CrCl 14 ml/min).  She is not a candidate for dialysis (per report). She has required transfusion support.  She was admitted to [facility] 01/11/2015 with E coli sepsis.  Diet is poor.  She denies any bleeding.  It is unclear when she had her last colonoscopy.  Work-up on 03/17/2015 revealed a hematocrit of 27.5, hemoglobin 9.3,  MCV 87.3, platelets 133,000, white count 7200 with an ANC of 5000. Differential was unremarkable.  Creatinine was 3.05 (creatinine clearance 14 ml/minute).  Normal studies included a B12, folate 20.5, SPEP, free light chain ratio, TSH, free T4.  Ferritin was 191 with a saturation of 13% and a TIBC of 159 (low).  Erythropoietin level was 15.8 (inappropriately low).  24-hour urine on 03/23/2015 for UPEP and free light chains revealed no monoclonal protein and a normal free light chain ratio.  She began Procrit on 04/23/2015.  Her hemoglobin has improved from 8.1 on 03/19/2015 to 9.8 on 06/05/2015.  Procrit is held for a hemoglobin > 11.  Symptomatically, she is debilitated.  She is a resident at [facility].  She is undergoing a urology evaluation.  She describes issues with hemorrhoidal bleeding.  Plan: 1.  Review interim labs and response to Procrit.  Discuss switch from every 2 week to every 3 weeks.. 2.  Follow-up with gastroenterology to ensure up to date on colonoscopy. 3.  RTC on 06/26/2015 for labs (Hgb) and +/- Procrit. 4.  RTC on 07/17/2015 for labs (Hgb), and +/- Procrit.. 5.  RTC on 08/08/2015 for MD assessment, labs (CBC with diff) and Hgb +/- Procrit.   Gina Bath, MD  06/17/2015, 11:47 AM

## 2015-06-18 ENCOUNTER — Ambulatory Visit: Payer: Medicare Other

## 2015-06-23 ENCOUNTER — Encounter: Payer: Self-pay | Admitting: Hematology and Oncology

## 2015-06-24 ENCOUNTER — Other Ambulatory Visit: Admission: RE | Admit: 2015-06-24 | Payer: Medicare Other | Source: Ambulatory Visit | Admitting: *Deleted

## 2015-06-24 ENCOUNTER — Other Ambulatory Visit
Admission: RE | Admit: 2015-06-24 | Discharge: 2015-06-24 | Disposition: A | Discharge status: Home / Self Care | Payer: Medicare Other | Source: Ambulatory Visit | Attending: Family Medicine | Admitting: Family Medicine

## 2015-06-24 ENCOUNTER — Other Ambulatory Visit
Admission: RE | Admit: 2015-06-24 | Discharge: 2015-06-24 | Disposition: A | Discharge status: Home / Self Care | Payer: Medicare Other | Source: Ambulatory Visit | Attending: Nurse Practitioner | Admitting: Nurse Practitioner

## 2015-06-24 DIAGNOSIS — R0989 Other specified symptoms and signs involving the circulatory and respiratory systems: Secondary | ICD-10-CM | POA: Diagnosis present

## 2015-06-24 DIAGNOSIS — R4182 Altered mental status, unspecified: Secondary | ICD-10-CM | POA: Insufficient documentation

## 2015-06-24 DIAGNOSIS — R410 Disorientation, unspecified: Secondary | ICD-10-CM | POA: Diagnosis present

## 2015-06-24 DIAGNOSIS — R05 Cough: Secondary | ICD-10-CM | POA: Diagnosis present

## 2015-06-24 DIAGNOSIS — N184 Chronic kidney disease, stage 4 (severe): Secondary | ICD-10-CM | POA: Insufficient documentation

## 2015-06-24 LAB — BASIC METABOLIC PANEL
ANION GAP: 5 (ref 5–15)
BUN: 65 mg/dL — ABNORMAL HIGH (ref 6–20)
CHLORIDE: 110 mmol/L (ref 101–111)
CO2: 22 mmol/L (ref 22–32)
Calcium: 9.2 mg/dL (ref 8.9–10.3)
Creatinine, Ser: 3.27 mg/dL — ABNORMAL HIGH (ref 0.44–1.00)
GFR calc non Af Amer: 13 mL/min — ABNORMAL LOW (ref >60)
GFR, EST AFRICAN AMERICAN: 15 mL/min — AB (ref >60)
Glucose, Bld: 258 mg/dL — ABNORMAL HIGH (ref 65–99)
Potassium: 3.9 mmol/L (ref 3.5–5.1)
Sodium: 137 mmol/L (ref 135–145)

## 2015-06-24 LAB — URINALYSIS COMPLETE WITH MICROSCOPIC (ARMC ONLY)
BILIRUBIN URINE: NEGATIVE
Glucose, UA: 50 mg/dL — AB
KETONES UR: NEGATIVE mg/dL
NITRITE: NEGATIVE
PROTEIN: 100 mg/dL — AB
Specific Gravity, Urine: 1.012 (ref 1.005–1.030)
Squamous Epithelial / LPF: NONE SEEN
pH: 5 (ref 5.0–8.0)

## 2015-06-24 LAB — CBC WITH DIFFERENTIAL/PLATELET
BASOS PCT: 0 %
Basophils Absolute: 0 10*3/uL (ref 0–0.1)
Eosinophils Absolute: 0.2 10*3/uL (ref 0–0.7)
Eosinophils Relative: 3 %
HEMATOCRIT: 30.4 % — AB (ref 35.0–47.0)
HEMOGLOBIN: 10.2 g/dL — AB (ref 12.0–16.0)
Lymphocytes Relative: 16 %
Lymphs Abs: 1.3 10*3/uL (ref 1.0–3.6)
MCH: 29.1 pg (ref 26.0–34.0)
MCHC: 33.4 g/dL (ref 32.0–36.0)
MCV: 87.2 fL (ref 80.0–100.0)
MONOS PCT: 4 %
Monocytes Absolute: 0.3 10*3/uL (ref 0.2–0.9)
NEUTROS ABS: 6.5 10*3/uL (ref 1.4–6.5)
NEUTROS PCT: 77 %
Platelets: 112 10*3/uL — ABNORMAL LOW (ref 150–440)
RBC: 3.49 MIL/uL — ABNORMAL LOW (ref 3.80–5.20)
RDW: 13.6 % (ref 11.5–14.5)
WBC: 8.3 10*3/uL (ref 3.6–11.0)

## 2015-06-26 ENCOUNTER — Inpatient Hospital Stay
Admission: EM | Admit: 2015-06-26 | Discharge: 2015-06-29 | DRG: 064 | Disposition: A | Discharge status: Other Acute Inpatient Hospital | Payer: Medicare Other | Attending: Internal Medicine | Admitting: Internal Medicine

## 2015-06-26 ENCOUNTER — Inpatient Hospital Stay: Payer: Medicare Other | Attending: Hematology and Oncology

## 2015-06-26 ENCOUNTER — Encounter: Payer: Self-pay | Admitting: Emergency Medicine

## 2015-06-26 ENCOUNTER — Emergency Department: Payer: Medicare Other

## 2015-06-26 ENCOUNTER — Other Ambulatory Visit: Payer: Self-pay | Admitting: Hematology and Oncology

## 2015-06-26 ENCOUNTER — Inpatient Hospital Stay: Payer: Medicare Other

## 2015-06-26 DIAGNOSIS — Z882 Allergy status to sulfonamides status: Secondary | ICD-10-CM

## 2015-06-26 DIAGNOSIS — I248 Other forms of acute ischemic heart disease: Secondary | ICD-10-CM | POA: Diagnosis present

## 2015-06-26 DIAGNOSIS — G40409 Other generalized epilepsy and epileptic syndromes, not intractable, without status epilepticus: Secondary | ICD-10-CM | POA: Diagnosis present

## 2015-06-26 DIAGNOSIS — Z841 Family history of disorders of kidney and ureter: Secondary | ICD-10-CM | POA: Diagnosis not present

## 2015-06-26 DIAGNOSIS — Z833 Family history of diabetes mellitus: Secondary | ICD-10-CM

## 2015-06-26 DIAGNOSIS — I639 Cerebral infarction, unspecified: Principal | ICD-10-CM | POA: Diagnosis present

## 2015-06-26 DIAGNOSIS — H409 Unspecified glaucoma: Secondary | ICD-10-CM | POA: Diagnosis present

## 2015-06-26 DIAGNOSIS — R4701 Aphasia: Secondary | ICD-10-CM

## 2015-06-26 DIAGNOSIS — Z794 Long term (current) use of insulin: Secondary | ICD-10-CM

## 2015-06-26 DIAGNOSIS — G9340 Encephalopathy, unspecified: Secondary | ICD-10-CM | POA: Diagnosis present

## 2015-06-26 DIAGNOSIS — H9190 Unspecified hearing loss, unspecified ear: Secondary | ICD-10-CM | POA: Diagnosis present

## 2015-06-26 DIAGNOSIS — E1165 Type 2 diabetes mellitus with hyperglycemia: Secondary | ICD-10-CM | POA: Diagnosis present

## 2015-06-26 DIAGNOSIS — Z880 Allergy status to penicillin: Secondary | ICD-10-CM

## 2015-06-26 DIAGNOSIS — Z90710 Acquired absence of both cervix and uterus: Secondary | ICD-10-CM

## 2015-06-26 DIAGNOSIS — E785 Hyperlipidemia, unspecified: Secondary | ICD-10-CM | POA: Diagnosis present

## 2015-06-26 DIAGNOSIS — N185 Chronic kidney disease, stage 5: Secondary | ICD-10-CM | POA: Diagnosis present

## 2015-06-26 DIAGNOSIS — E1122 Type 2 diabetes mellitus with diabetic chronic kidney disease: Secondary | ICD-10-CM | POA: Diagnosis present

## 2015-06-26 DIAGNOSIS — Z8249 Family history of ischemic heart disease and other diseases of the circulatory system: Secondary | ICD-10-CM

## 2015-06-26 DIAGNOSIS — I10 Essential (primary) hypertension: Secondary | ICD-10-CM | POA: Diagnosis present

## 2015-06-26 DIAGNOSIS — Z9049 Acquired absence of other specified parts of digestive tract: Secondary | ICD-10-CM | POA: Diagnosis not present

## 2015-06-26 DIAGNOSIS — N390 Urinary tract infection, site not specified: Secondary | ICD-10-CM | POA: Diagnosis present

## 2015-06-26 DIAGNOSIS — M6289 Other specified disorders of muscle: Secondary | ICD-10-CM | POA: Diagnosis not present

## 2015-06-26 DIAGNOSIS — N179 Acute kidney failure, unspecified: Secondary | ICD-10-CM | POA: Diagnosis present

## 2015-06-26 DIAGNOSIS — Z79899 Other long term (current) drug therapy: Secondary | ICD-10-CM

## 2015-06-26 DIAGNOSIS — I120 Hypertensive chronic kidney disease with stage 5 chronic kidney disease or end stage renal disease: Secondary | ICD-10-CM | POA: Diagnosis present

## 2015-06-26 DIAGNOSIS — Z888 Allergy status to other drugs, medicaments and biological substances status: Secondary | ICD-10-CM

## 2015-06-26 DIAGNOSIS — E039 Hypothyroidism, unspecified: Secondary | ICD-10-CM | POA: Diagnosis present

## 2015-06-26 DIAGNOSIS — Z823 Family history of stroke: Secondary | ICD-10-CM

## 2015-06-26 DIAGNOSIS — R569 Unspecified convulsions: Secondary | ICD-10-CM

## 2015-06-26 DIAGNOSIS — K219 Gastro-esophageal reflux disease without esophagitis: Secondary | ICD-10-CM | POA: Diagnosis present

## 2015-06-26 DIAGNOSIS — IMO0002 Reserved for concepts with insufficient information to code with codable children: Secondary | ICD-10-CM | POA: Diagnosis present

## 2015-06-26 DIAGNOSIS — Z9889 Other specified postprocedural states: Secondary | ICD-10-CM

## 2015-06-26 LAB — BASIC METABOLIC PANEL
Anion gap: 8 (ref 5–15)
BUN: 62 mg/dL — AB (ref 6–20)
CHLORIDE: 117 mmol/L — AB (ref 101–111)
CO2: 17 mmol/L — ABNORMAL LOW (ref 22–32)
CREATININE: 3.44 mg/dL — AB (ref 0.44–1.00)
Calcium: 8.6 mg/dL — ABNORMAL LOW (ref 8.9–10.3)
GFR, EST AFRICAN AMERICAN: 14 mL/min — AB (ref >60)
GFR, EST NON AFRICAN AMERICAN: 12 mL/min — AB (ref >60)
Glucose, Bld: 168 mg/dL — ABNORMAL HIGH (ref 65–99)
POTASSIUM: 4.5 mmol/L (ref 3.5–5.1)
SODIUM: 142 mmol/L (ref 135–145)

## 2015-06-26 LAB — URINALYSIS COMPLETE WITH MICROSCOPIC (ARMC ONLY)
BILIRUBIN URINE: NEGATIVE
Glucose, UA: 50 mg/dL — AB
KETONES UR: NEGATIVE mg/dL
NITRITE: NEGATIVE
Protein, ur: 100 mg/dL — AB
SQUAMOUS EPITHELIAL / LPF: NONE SEEN
Specific Gravity, Urine: 1.012 (ref 1.005–1.030)
pH: 5 (ref 5.0–8.0)

## 2015-06-26 LAB — GLUCOSE, CAPILLARY: Glucose-Capillary: 157 mg/dL — ABNORMAL HIGH (ref 65–99)

## 2015-06-26 LAB — URINE CULTURE

## 2015-06-26 LAB — CBC
HEMATOCRIT: 27.6 % — AB (ref 35.0–47.0)
Hemoglobin: 9 g/dL — ABNORMAL LOW (ref 12.0–16.0)
MCH: 29.2 pg (ref 26.0–34.0)
MCHC: 32.6 g/dL (ref 32.0–36.0)
MCV: 89.5 fL (ref 80.0–100.0)
PLATELETS: 100 10*3/uL — AB (ref 150–440)
RBC: 3.09 MIL/uL — AB (ref 3.80–5.20)
RDW: 13.7 % (ref 11.5–14.5)
WBC: 4.5 10*3/uL (ref 3.6–11.0)

## 2015-06-26 MED ORDER — ONDANSETRON HCL 4 MG PO TABS: 4.0000 mg | ORAL_TABLET | Freq: Four times a day (QID) | ORAL | Status: DC | PRN

## 2015-06-26 MED ORDER — STROKE: EARLY STAGES OF RECOVERY BOOK
Freq: Once | Status: AC
  Administered 2015-06-27: 02:00:00

## 2015-06-26 MED ORDER — HEPARIN SODIUM (PORCINE) 5000 UNIT/ML IJ SOLN
5000.0000 [IU] | Freq: Three times a day (TID) | INTRAMUSCULAR | Status: DC
  Administered 2015-06-27 – 2015-06-29 (×7): 5000 [IU] via SUBCUTANEOUS
  Filled 2015-06-26 (×7): qty 1

## 2015-06-26 MED ORDER — ONDANSETRON HCL 4 MG/2ML IJ SOLN: 4.0000 mg | Freq: Four times a day (QID) | INTRAMUSCULAR | Status: DC | PRN

## 2015-06-26 MED ORDER — SODIUM CHLORIDE 0.9 % IV BOLUS (SEPSIS)
250.0000 mL | Freq: Once | INTRAVENOUS | Status: AC
  Administered 2015-06-26: 250 mL via INTRAVENOUS

## 2015-06-26 MED ORDER — SODIUM CHLORIDE 0.9 % IV SOLN
100.0000 mg | Freq: Two times a day (BID) | INTRAVENOUS | Status: DC
  Administered 2015-06-27: 100 mg via INTRAVENOUS
  Filled 2015-06-26 (×3): qty 10

## 2015-06-26 MED ORDER — SODIUM CHLORIDE 0.9 % IV SOLN
INTRAVENOUS | Status: DC
  Administered 2015-06-27: 02:00:00 via INTRAVENOUS

## 2015-06-26 MED ORDER — SODIUM CHLORIDE 0.9% FLUSH
3.0000 mL | Freq: Two times a day (BID) | INTRAVENOUS | Status: DC
  Administered 2015-06-27 – 2015-06-29 (×5): 3 mL via INTRAVENOUS

## 2015-06-26 MED ORDER — SODIUM CHLORIDE 0.9 % IV SOLN
1.0000 g | Freq: Once | INTRAVENOUS | Status: AC
  Administered 2015-06-26: 1 g via INTRAVENOUS
  Filled 2015-06-26: qty 1

## 2015-06-26 MED ORDER — SODIUM CHLORIDE 0.9 % IV SOLN
200.0000 mg | Freq: Once | INTRAVENOUS | Status: AC
  Administered 2015-06-26: 200 mg via INTRAVENOUS
  Filled 2015-06-26: qty 20

## 2015-06-26 NOTE — ED Notes (Signed)
PT via ems from [facility] for witnessed seizure during dinner. Staff suctioned pt and  Per nursing home pt has been CVA symp x2-3days. Pt had seizure again on Truck was given  versaid. Pt not alert, pupils pinpoint and nonreactive. VS stable

## 2015-06-26 NOTE — H&P (Signed)
[facility] [place] [facility]   PATIENT NAME: Gina Andersen    MR#:  [id number]  DATE OF BIRTH:  03-04-1935  DATE OF ADMISSION:  06/26/2015  PRIMARY CARE PHYSICIAN: Nelda Bucks., MD   REQUESTING/REFERRING PHYSICIAN: Inocencio Homes, MD  CHIEF COMPLAINT:   Chief Complaint  Patient presents with  . Seizures    HISTORY OF PRESENT ILLNESS:  Gina Andersen  is a 81 y.o. female who presents with seizures. Patient has a history of having had one seizure in the past in the setting of hypoglycemia. Today she had a seizure at her nursing facility, followed by second seizure. She is being treated for UTI for the past 2-3 days. She's been known to have ESBL, and was getting IV ertapenem. She does not give any further history today, her family provides the history. They state that at baseline she is very talkative and interacts a lot, but that over the weekend she began to be very lethargic, and stopped communicating very much. They state that the physician at the nursing facility question whether or not she may have had a "mini stroke". Here in the ED today she was given IV Vimpat per neurology's recommendation. Hospitals were called for admission.  PAST MEDICAL HISTORY:   Past Medical History  Diagnosis Date  . Hypothyroidism 11/01/1995  . Anxiety 11/01/1995  . Diabetes mellitus type II 05/25/1996    insulin-requiring  . Hyperlipemia 06/25/2002  . Atopic dermatitis   . History of MRI of brain and brain stem 07/10/2005    w amd w/o no mass/infarct small vess dz  . Dehydration     Hosp [facility], hyperglycemia and ketoacidosis (stopped insulin)  . Pancytopenia 06/24-7/05/2007    Tissue D/O  . History of gallstones 11/21/2007    Abd U/S multip gallstones contracted GB CBD ULN Tr hydronephr R  . Acute cholecystitis 12/1/-05/20/08    [facility], s/p lap choleycystectomy CTD DM w/hypoglycemic sz in ER  . Gallstones 05/11/08    abd U/S gallstones pericholeycystic fluid [facility]   . Gastro-esophageal reflux disease without esophagitis   . Depression   . Anxiety disorder   . Dysphagia   . Hearing impaired   . Hypertension   . Carpal tunnel syndrome   . Weakness   . Anemia   . Glaucoma   . Mood disorder (HCC)   . Kidney disease     PAST SURGICAL HISTORY:   Past Surgical History  Procedure Laterality Date  . Partial hysterectomy      OV intact  . Carpel tunn      right  . Trigger finger release      left  . Ganglionectomy    . Cholecystectomy    . Abdominal hysterectomy      SOCIAL HISTORY:   Social History  Substance Use Topics  . Smoking status: Never Smoker   . Smokeless tobacco: Never Used  . Alcohol Use: No    FAMILY HISTORY:   Family History  Problem Relation Age of Onset  . Kidney disease Mother   . Diabetes Mother   . Hypertension Mother   . Diabetes Brother   . Stroke Brother   . Stroke Brother     CVA  . Diabetes Brother     DRUG ALLERGIES:   Allergies  Allergen Reactions  . Sulfa Antibiotics Other (See Comments)    Reaction:  Unknown   . Hydrocodone-Acetaminophen Other (See Comments)    Reaction:  Dizziness   . Lamictal [Lamotrigine]  Other (See Comments)    Reaction:  Altered mental status   . Lyrica [Pregabalin] Other (See Comments)    Reaction:  Altered mental status   . Meloxicam Nausea And Vomiting  . Prednisone Other (See Comments)    Reaction:  Altered mental status   . Seroquel [Quetiapine] Other (See Comments)    Reaction:  Altered mental status   . Zoloft [Sertraline] Other (See Comments)    Reaction:  Altered mental status   . Penicillins Rash and Other (See Comments)    Unable to obtain enough information to answer additional questions about this medication.      MEDICATIONS AT HOME:   Prior to Admission medications   Medication Sig Start Date End Date Taking? Authorizing Provider  bisacodyl (DULCOLAX) 10 MG suppository Place 10 mg rectally as needed for moderate constipation.   Yes Historical  Provider, MD  bisacodyl (DULCOLAX) 5 MG EC tablet Take 10 mg by mouth daily as needed for moderate constipation.   Yes Historical Provider, MD  cholecalciferol (VITAMIN D) 1000 UNITS tablet Take 500 Units by mouth daily.   Yes Historical Provider, MD  conjugated estrogens (PREMARIN) vaginal cream Place 1 Applicatorful vaginally 3 (three) times a week. Pt applies to urethra nightly.   Yes Historical Provider, MD  dextrose (GLUTOSE) 40 % GEL Take 1 Tube by mouth once as needed for low blood sugar.   Yes Historical Provider, MD  Ertapenem Sodium (INVANZ IV) Inject 500 mg into the muscle daily. 500mg  of Invanz mixed with 1% Lidocaine. 06/26/15 07/03/15 Yes Historical Provider, MD  ferrous sulfate 325 (65 FE) MG tablet Take 1 tablet (325 mg total) by mouth 2 (two) times daily with a meal. 01/18/15  Yes Alford Highland, MD  glucagon 1 MG injection Inject 1 mg into the vein once as needed (for severe hypoglycemia).    Yes Historical Provider, MD  insulin detemir (LEVEMIR) 100 UNIT/ML injection Inject 8-55 Units into the skin 2 (two) times daily. Pt uses 55 units in the morning and 8 units at bedtime.   Yes Historical Provider, MD  insulin lispro (HUMALOG) 100 UNIT/ML injection Inject 14 Units into the skin 2 (two) times daily after a meal. Pt uses after breakfast and lunch.   Yes Historical Provider, MD  levothyroxine (SYNTHROID, LEVOTHROID) 150 MCG tablet Take 150 mcg by mouth daily. Pt takes with one-half of a tablet.   Yes Historical Provider, MD  levothyroxine (SYNTHROID, LEVOTHROID) 25 MCG tablet Take 12.5 mcg by mouth daily. Pt takes with a tablet.   Yes Historical Provider, MD  lidocaine (XYLOCAINE) 2 % jelly Apply 1 application topically 2 (two) times daily. Pt applies to buttocks.   Yes Historical Provider, MD  LORazepam (ATIVAN) 1 MG tablet Take 1 mg by mouth every 6 (six) hours as needed (for severe agitation).   Yes Historical Provider, MD  LORazepam (ATIVAN) 2 MG/ML injection Inject 1 mg  into the muscle every 6 (six) hours as needed (for severe agitation).   Yes Historical Provider, MD  mupirocin ointment (BACTROBAN) 2 % Apply 1 application topically daily. Pt applies to big toe on right foot.   Yes Historical Provider, MD  Nutritional Supplements (SUPLENA/CARB STEADY PO) Take 1 Can by mouth 2 (two) times daily between meals.   Yes Historical Provider, MD  omeprazole (PRILOSEC) 20 MG capsule Take 20 mg by mouth daily.   Yes Historical Provider, MD  polyethylene glycol (MIRALAX / GLYCOLAX) packet Take 17 g by mouth daily.  Yes Historical Provider, MD  risperiDONE (RISPERDAL) 0.25 MG tablet Take 0.125 mg by mouth at bedtime.    Yes Historical Provider, MD  travoprost, benzalkonium, (TRAVATAN) 0.004 % ophthalmic solution Place 1 drop into both eyes at bedtime.    Yes Historical Provider, MD  Zinc Oxide (BOUDREAUXS BUTT PASTE EX) Apply 1 application topically 3 (three) times daily as needed (for irritation).   Yes Historical Provider, MD  levothyroxine (SYNTHROID, LEVOTHROID) 75 MCG tablet Take 1 tablet (75 mcg total) by mouth daily before breakfast. Patient not taking: Reported on 06/26/2015 01/18/15   Alford Highland, MD  LORazepam (ATIVAN) 0.5 MG tablet Take 1 tablet (0.5 mg total) by mouth every 6 (six) hours as needed for anxiety. Patient not taking: Reported on 06/26/2015 01/18/15   Alford Highland, MD    REVIEW OF SYSTEMS:  Review of Systems  Unable to perform ROS: acuity of condition     VITAL SIGNS:   Filed Vitals:   06/26/15 1912 06/26/15 2054 06/26/15 2156  BP: 129/72  124/55  Pulse: 78  80  Temp:  98.7 F (37.1 C)   TempSrc:  Oral   Resp: 12  16  SpO2: 100%  100%   Wt Readings from Last 3 Encounters:  02/20/15 59.875 kg (132 lb)  02/07/15 59.875 kg (132 lb)  01/18/15 67.405 kg (148 lb 9.6 oz)    PHYSICAL EXAMINATION:  Physical Exam  Vitals reviewed. Constitutional: She appears well-developed and well-nourished. No distress.  HENT:  Head: Normocephalic and  atraumatic.  Mouth/Throat: Oropharynx is clear and moist.  Eyes: Conjunctivae and EOM are normal. Pupils are equal, round, and reactive to light. No scleral icterus.  Neck: Normal range of motion. Neck supple. No JVD present. No thyromegaly present.  Cardiovascular: Normal rate, regular rhythm and intact distal pulses.  Exam reveals no gallop and no friction rub.   No murmur heard. Respiratory: Effort normal and breath sounds normal. No respiratory distress. She has no wheezes. She has no rales.  GI: Soft. Bowel sounds are normal. She exhibits no distension. There is no tenderness.  Musculoskeletal: Normal range of motion. She exhibits no edema.  No arthritis, no gout  Lymphadenopathy:    She has no cervical adenopathy.  Neurological: No cranial nerve deficit.  Confused, minimally responsive, not following commands  Skin: Skin is warm and dry. No rash noted. No erythema.  Psychiatric:  Unable to assess due to patient condition    LABORATORY PANEL:   CBC  Recent Labs Lab 06/26/15 1917  WBC 4.5  HGB 9.0*  HCT 27.6*  PLT 100*   ------------------------------------------------------------------------------------------------------------------  Chemistries   Recent Labs Lab 06/26/15 1917  NA 142  K 4.5  CL 117*  CO2 17*  GLUCOSE 168*  BUN 62*  CREATININE 3.44*  CALCIUM 8.6*   ------------------------------------------------------------------------------------------------------------------  Cardiac Enzymes No results for input(s): TROPONINI in the last 168 hours. ------------------------------------------------------------------------------------------------------------------  RADIOLOGY:  Ct Head Wo Contrast  06/26/2015  CLINICAL DATA:  Witnessed seizure during dinner. CVA symptoms over the past several days. EXAM: CT HEAD WITHOUT CONTRAST TECHNIQUE: Contiguous axial images were obtained from the base of the skull through the vertex without intravenous contrast.  COMPARISON:  09/26/2013 FINDINGS: Chronic right periventricular white matter lacunar infarct measuring 5 mm on image 19 series 2, although an interval finding from May, 2015. Otherwise, the brainstem, cerebellum, cerebral peduncles, thalami, basal ganglia, basilar cisterns, and ventricular system appear within normal limits. Periventricular white matter and corona radiata hypodensities favor chronic ischemic microvascular white matter  disease. No intracranial hemorrhage, mass lesion, or acute CVA. Chronic ethmoid, and left sphenoid sinusitis observed. Old deformity of the left zygomatic arch. There is atherosclerotic calcification of the cavernous carotid arteries bilaterally. IMPRESSION: 1. Chronic microvascular white matter disease. Chronic lacunar infarct in the right periventricular white matter. No acute intracranial findings are apparent. 2. Chronic ethmoid and left sphenoid sinusitis. Electronically Signed   By: Gaylyn Rong M.D.   On: 06/26/2015 19:49   Dg Chest Portable 1 View  06/26/2015  CLINICAL DATA:  Seizure. EXAM: PORTABLE CHEST 1 VIEW COMPARISON:  Chest radiograph 01/16/2015, abdominal CT 05/16/2015 FINDINGS: The cardiomediastinal contours are unchanged, mild cardiomegaly. Mild vascular congestion without alveolar edema. No consolidation, pleural effusion, or pneumothorax. No acute osseous abnormalities are seen. Air in the left upper quadrant within the stomach and out landing stool within the colon, similar in appearance to prior abdominal CT 05/16/2015. IMPRESSION: Stable mild cardiomegaly.  Vascular congestion, new. Electronically Signed   By: Rubye Oaks M.D.   On: 06/26/2015 19:55    EKG:   Orders placed or performed during the hospital encounter of 06/26/15  . EKG 12-Lead  . EKG 12-Lead  . ED EKG  . ED EKG    IMPRESSION AND PLAN:  Principal Problem:   Seizures (HCC) - patient started on IV Vimpat. We'll admit her with seizure precautions. Get a neurology consult. If  she continues to have seizure activity we can add Keppra. Unclear underlying etiology for seizures. Some possibility for post stroke seizures. We will start her stroke workup as well. Active Problems:   UTI (lower urinary tract infection) - continue to treat with IV ertapenem, send urine culture.   CKD (chronic kidney disease), stage V (HCC) - avoid nephrotoxins, monitor very closely. Patient is not on dialysis.   DM (diabetes mellitus), type 2, uncontrolled (HCC) - sliding scale insulin with corresponding glucose checks every 6 hours while nothing by mouth.   HTN (hypertension) - monitor, when necessary antihypertensives   Hypothyroidism - patient is nothing by mouth, need to restart home dose thyroid replacement as soon as she is able to take by mouth.   HLD (hyperlipidemia) - we'll need to restart anti-lipids when she is able to take by mouth.  All the records are reviewed and case discussed with ED provider. Management plans discussed with the patient and/or family.  DVT PROPHYLAXIS: SubQ heparin  GI PROPHYLAXIS: None  ADMISSION STATUS: Inpatient  CODE STATUS: Full Code Status History    Date Active Date Inactive Code Status Order ID Comments User Context   01/12/2015 12:45 AM 01/18/2015  7:51 PM Full Code [id number]  Crissie Figures, MD Inpatient   12/08/2012  5:18 PM 12/11/2012 12:24 PM Full Code [id number]  Claudie Fisherman Inpatient   12/05/2012  7:27 PM 12/08/2012  5:18 PM Full Code [id number]  Storm Frisk, MD Inpatient      TOTAL TIME TAKING CARE OF THIS PATIENT: 50 minutes.    Imberly Troxler FIELDING 06/26/2015, 10:41 PM  [facility]  Office  [phone]  CC: Primary care physician; Nelda Bucks., MD

## 2015-06-26 NOTE — ED Notes (Signed)
POC CBG= 157 mg/dL

## 2015-06-26 NOTE — ED Provider Notes (Signed)
[facility] Emergency Department Provider Note  ____________________________________________  Time seen: Approximately 8:49 PM  I have reviewed the triage vital signs and the nursing notes.   HISTORY  Chief Complaint Seizures  Caveat-history of present illness review systems is limited due to patient's inability to verbalize. All information obtained from staff at [facility] as well as patient's family at bedside.  HPI Gina Andersen is a 81 y.o. female with history of CVA with limited verbal ability since stroke, communicate primarily through sign language, diabetes mellitus type 2, hypertension, hyperlipidemia, hypothyroidism, anxiety disorder, chronic anemia who presents from [facility] for seizures which were witnessed this evening, sudden onset, now resolved, no modifying factors. According to [facility] staff and the patient's family, she was eating mashed potatoes when she had a generalized tonic-clonic seizure and appeared to choke. En route to [facility], EMS reported that she had another generalized tonic-clonic seizure and received 2 mg of Versed. Family reports that the patient has been ill with an upper respiratory tract infection as well as urinary tract infection, she had a fever to 104 yesterday, she has been lethargic since Monday. She has been given Invanz for UTI.   Past Medical History  Diagnosis Date  . Hypothyroidism 11/01/1995  . Anxiety 11/01/1995  . Diabetes mellitus type II 05/25/1996    insulin-requiring  . Hyperlipemia 06/25/2002  . Atopic dermatitis   . History of MRI of brain and brain stem 07/10/2005    w amd w/o no mass/infarct small vess dz  . Dehydration     Hosp [facility], hyperglycemia and ketoacidosis (stopped insulin)  . Pancytopenia 06/24-7/05/2007    Tissue D/O  . History of gallstones 11/21/2007    Abd U/S multip gallstones contracted GB CBD ULN Tr hydronephr R  . Acute cholecystitis 12/1/-05/20/08    [facility], s/p lap  choleycystectomy CTD DM w/hypoglycemic sz in ER  . Gallstones 05/11/08    abd U/S gallstones pericholeycystic fluid [facility]  . Gastro-esophageal reflux disease without esophagitis   . Depression   . Anxiety disorder   . Dysphagia   . Hearing impaired   . Hypertension   . Carpal tunnel syndrome   . Weakness   . Anemia   . Glaucoma   . Mood disorder (HCC)   . Kidney disease     Patient Active Problem List   Diagnosis Date Noted  . Anemia due to stage 5 chronic kidney disease (HCC) 04/16/2015  . Disease of thyroid gland 02/08/2015  . Hemorrhoid 02/08/2015  . Glaucoma 02/08/2015  . Acid reflux 02/08/2015  . Urinary retention with incomplete bladder emptying 01/13/2015  . Pressure ulcer 01/12/2015  . Sepsis due to urinary tract infection (HCC) 01/11/2015  . Acute kidney injury (HCC) 01/11/2015  . Acute encephalopathy 01/11/2015  . DM (diabetes mellitus), type 2, uncontrolled (HCC) 01/11/2015  . Chronic anemia 01/11/2015  . Anemia 01/11/2015  . Nonspecific (abnormal) findings on radiological and other examination of gastrointestinal tract 12/07/2012  . Septic shock(785.52) 12/04/2012  . UTI (lower urinary tract infection) 12/04/2012  . Colitis 12/04/2012  . B12 deficiency 08/04/2012  . Abnormal LFTs 12/22/2011  . Diabetes mellitus, type 2 (HCC) 12/21/2011  . Adynamia 12/21/2011  . DERMATITIS, ATOPIC 06/27/2010  . HEMORRHOIDS, WITH BLEEDING 11/27/2008  . Pain in limb 06/01/2008  . PANCYTOPENIA 11/28/2007  . 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 HEPATITIS B, HX OF 11/28/2007  . LEUKOPENIA, MILD 12/01/2006  . DISORDER, MENOPAUSAL NOS 09/08/2006  . HYPERLIPIDEMIA  06/25/2002  . DIABETES MELLITUS, TYPE II 05/25/1996  . HYPOTHYROIDISM 11/01/1995  . ANXIETY 11/01/1995    Past Surgical History  Procedure Laterality Date  . Partial hysterectomy      OV intact  . Carpel tunn      right  . Trigger finger release      left  . Ganglionectomy    . Cholecystectomy    .  Abdominal hysterectomy      Current Outpatient Rx  Name  Route  Sig  Dispense  Refill  . bisacodyl (DULCOLAX) 10 MG suppository   Rectal   Place 10 mg rectally as needed for moderate constipation.         . conjugated estrogens (PREMARIN) vaginal cream   Vaginal   Place 1 Applicatorful vaginally 3 (three) times a week. Pt applies to urethra nightly.         . insulin detemir (LEVEMIR) 100 UNIT/ML injection   Subcutaneous   Inject 8-55 Units into the skin 2 (two) times daily. Pt uses 55 units in the morning and 8 units at bedtime.         . insulin lispro (HUMALOG) 100 UNIT/ML injection   Subcutaneous   Inject 14 Units into the skin 2 (two) times daily after a meal. Pt uses after breakfast and lunch.         . levothyroxine (SYNTHROID, LEVOTHROID) 150 MCG tablet   Oral   Take 150 mcg by mouth daily. Pt takes with one-half of a tablet.         Marland Kitchen levothyroxine (SYNTHROID, LEVOTHROID) 25 MCG tablet   Oral   Take 12.5 mcg by mouth daily. Pt takes with a tablet.         . mupirocin ointment (BACTROBAN) 2 %   Topical   Apply 1 application topically daily. Pt applies to big toe on right foot.         . Nutritional Supplements (SUPLENA/CARB STEADY PO)   Oral   Take 1 Can by mouth 2 (two) times daily between meals.         . Zinc Oxide (BOUDREAUXS BUTT PASTE EX)   Apply externally   Apply 1 application topically 3 (three) times daily as needed (for irritation).         . bisacodyl (DULCOLAX) 5 MG EC tablet   Oral   Take 10 mg by mouth daily as needed for moderate constipation.         . cholecalciferol (VITAMIN D) 1000 UNITS tablet   Oral   Take 500 Units by mouth daily.         . ferrous sulfate 325 (65 FE) MG tablet   Oral   Take 1 tablet (325 mg total) by mouth 2 (two) times daily with a meal.      3   . glucagon 1 MG injection   Intravenous   Inject 1 mg into the vein once as needed.         Marland Kitchen levothyroxine (SYNTHROID,  LEVOTHROID) 75 MCG tablet   Oral   Take 1 tablet (75 mcg total) by mouth daily before breakfast.         . lidocaine (XYLOCAINE) 5 % ointment   Topical   Apply 1 application topically daily as needed.         Marland Kitchen LORazepam (ATIVAN) 0.5 MG tablet   Oral   Take 1 tablet (0.5 mg total) by mouth every 6 (six) hours as needed for anxiety.  30 tablet   0   . omeprazole (PRILOSEC) 20 MG capsule   Oral   Take 20 mg by mouth daily.         . polyethylene glycol (MIRALAX / GLYCOLAX) packet   Oral   Take 17 g by mouth daily.         . risperiDONE (RISPERDAL) 0.25 MG tablet   Oral   Take 0.25 mg by mouth at bedtime.         . travoprost, benzalkonium, (TRAVATAN) 0.004 % ophthalmic solution   Both Eyes   Place 1 drop into both eyes daily.          . traZODone (DESYREL) 50 MG tablet   Oral   Take 50 mg by mouth at bedtime.           Allergies Sulfa antibiotics; Hydrocodone-acetaminophen; Lamictal; Lyrica; Meloxicam; Prednisone; Seroquel; Zoloft; and Penicillins  Family History  Problem Relation Age of Onset  . Kidney disease Mother   . Diabetes Mother   . Hypertension Mother   . Diabetes Brother   . Stroke Brother   . Stroke Brother     CVA  . Diabetes Brother     Social History Social History  Substance Use Topics  . Smoking status: Never Smoker   . Smokeless tobacco: Never Used  . Alcohol Use: No    Review of Systems Constitutional: + fever Gastrointestinal: No nausea, no vomiting.  No diarrhea.    Caveat-history of present illness review systems is limited due to patient's inability to verbalize. All information obtained from staff at [facility] as well as patient's family at bedside.  ____________________________________________   PHYSICAL EXAM:  VITAL SIGNS: ED Triage Vitals  Enc Vitals Group     BP 06/26/15 1912 129/72 mmHg     Pulse Rate 06/26/15 1912 78     Resp 06/26/15 1912 12     Temp --      Temp src --      SpO2 06/26/15  1912 100 %     Weight --      Height --      Head Cir --      Peak Flow --      Pain Score --      Pain Loc --      Pain Edu? --      Excl. in GC? --     Constitutional: Sleeping but awakens to voice and light touch. Follows commands to move the left upper extremity and bilateral lower extremities but does not move the right arm spontaneously and only intermittently follows commands. Eyes: Conjunctivae are normal. There was 2 mm but reactive to light bilaterally. Head: Atraumatic. Nose: No congestion/rhinnorhea. Mouth/Throat: Mucous membranes are moist.  Oropharynx non-erythematous. Neck: No stridor.   Cardiovascular: Normal rate, regular rhythm. Grossly normal heart sounds.  Good peripheral circulation. Respiratory: Normal respiratory effort.  No retractions. Lungs CTAB. Gastrointestinal: Soft and nontender. No distention.  No CVA tenderness. Genitourinary: deferred Musculoskeletal: No lower extremity tenderness nor edema.  No joint effusions. Neurologic:  The patient does not verbalize. As above, she awakens to voice and light touch and only follows commands intermittently. She does not move the right upper extremity. Skin:  Skin is warm, dry and intact. No rash noted. Psychiatric: Mood and affect are normal. Speech and behavior are normal.  ____________________________________________   LABS (all labs ordered are listed, but only abnormal results are displayed)  Labs Reviewed  BASIC METABOLIC PANEL -  Abnormal; Notable for the following:    Chloride 117 (*)    CO2 17 (*)    Glucose, Bld 168 (*)    BUN 62 (*)    Creatinine, Ser 3.44 (*)    Calcium 8.6 (*)    GFR calc non Af Amer 12 (*)    GFR calc Af Amer 14 (*)    All other components within normal limits  CBC - Abnormal; Notable for the following:    RBC 3.09 (*)    Hemoglobin 9.0 (*)    HCT 27.6 (*)    Platelets 100 (*)    All other components within normal limits  URINALYSIS COMPLETEWITH MICROSCOPIC ([facility] ONLY) -  Abnormal; Notable for the following:    Color, Urine YELLOW (*)    APPearance TURBID (*)    Glucose, UA 50 (*)    Hgb urine dipstick 2+ (*)    Protein, ur 100 (*)    Leukocytes, UA 3+ (*)    Bacteria, UA MANY (*)    All other components within normal limits  GLUCOSE, CAPILLARY - Abnormal; Notable for the following:    Glucose-Capillary 157 (*)    All other components within normal limits  CULTURE, BLOOD (ROUTINE X 2)  CULTURE, BLOOD (ROUTINE X 2)  TROPONIN I  CBG MONITORING, ED   ____________________________________________  EKG  ED ECG REPORT I, Gayla Doss, the attending physician, personally viewed and interpreted this ECG.   Date: 06/26/2015  EKG Time: 19:12  Rate: 93  Rhythm: normal sinus rhythm  Axis: normal  Intervals:none  ST&T Change: No acute ST elevation.  ____________________________________________  RADIOLOGY  CT head IMPRESSION: 1. Chronic microvascular white matter disease. Chronic lacunar infarct in the right periventricular white matter. No acute intracranial findings are apparent. 2. Chronic ethmoid and left sphenoid sinusitis.  CXR IMPRESSION: Stable mild cardiomegaly. Vascular congestion, new.  ____________________________________________   PROCEDURES  Procedure(s) performed: None  Critical Care performed: No  ____________________________________________   INITIAL IMPRESSION / ASSESSMENT AND PLAN / ED COURSE  Pertinent labs & imaging results that were available during my care of the patient were reviewed by me and considered in my medical decision making (see chart for details).  Gina Andersen is a 81 y.o. female with history of CVA with limited verbal ability since stroke, communicate primarily through sign language, diabetes mellitus type 2, hypertension, hyperlipidemia, hypothyroidism, anxiety disorder, chronic anemia who presents from [facility] for seizures, at least 2 witnessed seizures this evening. Family at bedside  reports that she had a history of an isolated seizure years ago when her blood sugar was low but she is not carried the diagnosis of epilepsy. She has been ill with fever, lethargy, urinary tract infection. Currently she awakens to voice but appears confused and only intermittently follows commands. She does not move the right upper extremity. This may be a post ictal/Todd's paralysis. We'll continue to monitor. Plan for screening labs, CT head, chest x-ray. I discussed the case with Dr. Thad Ranger of neurology and we will load the patient with Vimpat. Anticipate admission.  ----------------------------------------- 9:51 PM on 06/26/2015 ----------------------------------------- Lungs reviewed and are notable for kidney disease, creatinine 3.4 for which has been over the past several weeks. Chest x-rays concerning for vascular congestion so will give light IV fluids due to concern that she could be bordering on developing pulmonary edema. CBC notable for anemia, hemoglobin 9. Urinalysis is concerning for persistent urinary tract infection. Cultures previously grew Escherichia coli sensitive to imipenem,  gentamicin. We'll give Invanz. Case discussed with the hospitalist, Dr. Anne Hahn, for admission. Troponin pending at time of admission. CT head shows no acute intracranial process.    ____________________________________________   FINAL CLINICAL IMPRESSION(S) / ED DIAGNOSES  Final diagnoses:  Seizure (HCC)  UTI (lower urinary tract infection)      Gayla Doss, MD 06/26/15 2153

## 2015-06-27 ENCOUNTER — Inpatient Hospital Stay: Payer: Medicare Other

## 2015-06-27 ENCOUNTER — Inpatient Hospital Stay: Admit: 2015-06-27 | Payer: Medicare Other

## 2015-06-27 DIAGNOSIS — R569 Unspecified convulsions: Secondary | ICD-10-CM

## 2015-06-27 DIAGNOSIS — M6289 Other specified disorders of muscle: Secondary | ICD-10-CM

## 2015-06-27 LAB — BASIC METABOLIC PANEL
ANION GAP: 3 — AB (ref 5–15)
BUN: 58 mg/dL — ABNORMAL HIGH (ref 6–20)
CHLORIDE: 121 mmol/L — AB (ref 101–111)
CO2: 20 mmol/L — AB (ref 22–32)
CREATININE: 2.99 mg/dL — AB (ref 0.44–1.00)
Calcium: 8.9 mg/dL (ref 8.9–10.3)
GFR calc non Af Amer: 14 mL/min — ABNORMAL LOW (ref >60)
GFR, EST AFRICAN AMERICAN: 16 mL/min — AB (ref >60)
Glucose, Bld: 112 mg/dL — ABNORMAL HIGH (ref 65–99)
POTASSIUM: 4.3 mmol/L (ref 3.5–5.1)
SODIUM: 144 mmol/L (ref 135–145)

## 2015-06-27 LAB — TROPONIN I
TROPONIN I: 0.05 ng/mL — AB (ref <0.031)
Troponin I: 0.05 ng/mL — ABNORMAL HIGH (ref <0.031)
Troponin I: 0.06 ng/mL — ABNORMAL HIGH (ref <0.031)

## 2015-06-27 LAB — LIPID PANEL
CHOL/HDL RATIO: 3.6 ratio
Cholesterol: 114 mg/dL (ref 0–200)
HDL: 32 mg/dL — AB (ref >40)
LDL CALC: 58 mg/dL (ref 0–99)
TRIGLYCERIDES: 121 mg/dL (ref <150)
VLDL: 24 mg/dL (ref 0–40)

## 2015-06-27 LAB — CBC
HEMATOCRIT: 28.5 % — AB (ref 35.0–47.0)
HEMOGLOBIN: 9.5 g/dL — AB (ref 12.0–16.0)
MCH: 29.6 pg (ref 26.0–34.0)
MCHC: 33.4 g/dL (ref 32.0–36.0)
MCV: 88.7 fL (ref 80.0–100.0)
Platelets: 100 10*3/uL — ABNORMAL LOW (ref 150–440)
RBC: 3.22 MIL/uL — AB (ref 3.80–5.20)
RDW: 13.6 % (ref 11.5–14.5)
WBC: 5.2 10*3/uL (ref 3.6–11.0)

## 2015-06-27 LAB — MRSA PCR SCREENING: MRSA BY PCR: POSITIVE — AB

## 2015-06-27 LAB — CK: Total CK: 66 U/L (ref 38–234)

## 2015-06-27 LAB — GLUCOSE, CAPILLARY
Glucose-Capillary: 161 mg/dL — ABNORMAL HIGH (ref 65–99)
Glucose-Capillary: 156 mg/dL — ABNORMAL HIGH (ref 65–99)

## 2015-06-27 LAB — HEMOGLOBIN A1C: Hgb A1c MFr Bld: 7.3 % — ABNORMAL HIGH (ref 4.0–6.0)

## 2015-06-27 MED ORDER — LACOSAMIDE 200 MG/20ML IV SOLN
100.0000 mg | Freq: Two times a day (BID) | INTRAVENOUS | Status: DC
  Administered 2015-06-28: 100 mg via INTRAVENOUS
  Filled 2015-06-27 (×4): qty 10

## 2015-06-27 MED ORDER — SODIUM CHLORIDE 0.45 % IV SOLN
INTRAVENOUS | Status: DC
  Administered 2015-06-27 – 2015-06-29 (×3): via INTRAVENOUS

## 2015-06-27 MED ORDER — MUPIROCIN 2 % EX OINT
1.0000 | TOPICAL_OINTMENT | Freq: Two times a day (BID) | CUTANEOUS | Status: DC
Start: 2015-06-27 — End: 2015-06-29
  Administered 2015-06-27 – 2015-06-29 (×5): 1 via NASAL
  Filled 2015-06-27: qty 22

## 2015-06-27 MED ORDER — SODIUM CHLORIDE 0.9 % IV SOLN
50.0000 mg | Freq: Two times a day (BID) | INTRAVENOUS | Status: DC
  Administered 2015-06-27: 50 mg via INTRAVENOUS
  Filled 2015-06-27 (×4): qty 5

## 2015-06-27 MED ORDER — INSULIN ASPART 100 UNIT/ML ~~LOC~~ SOLN
0.0000 [IU] | SUBCUTANEOUS | Status: DC
  Administered 2015-06-27: 1 [IU] via SUBCUTANEOUS
  Administered 2015-06-28: 3 [IU] via SUBCUTANEOUS
  Administered 2015-06-28: 2 [IU] via SUBCUTANEOUS
  Administered 2015-06-28: 3 [IU] via SUBCUTANEOUS
  Administered 2015-06-28 (×2): 2 [IU] via SUBCUTANEOUS
  Administered 2015-06-28 – 2015-06-29 (×3): 1 [IU] via SUBCUTANEOUS
  Filled 2015-06-27: qty 1
  Filled 2015-06-27: qty 5
  Filled 2015-06-27: qty 2
  Filled 2015-06-27: qty 1
  Filled 2015-06-27: qty 3
  Filled 2015-06-27 (×2): qty 1
  Filled 2015-06-27: qty 2
  Filled 2015-06-27: qty 3

## 2015-06-27 MED ORDER — CHLORHEXIDINE GLUCONATE CLOTH 2 % EX PADS
6.0000 | MEDICATED_PAD | Freq: Every day | CUTANEOUS | Status: DC
  Administered 2015-06-27 – 2015-06-29 (×3): 6 via TOPICAL

## 2015-06-27 MED ORDER — CETYLPYRIDINIUM CHLORIDE 0.05 % MT LIQD
7.0000 mL | Freq: Two times a day (BID) | OROMUCOSAL | Status: DC
  Administered 2015-06-27 – 2015-06-29 (×4): 7 mL via OROMUCOSAL

## 2015-06-27 MED ORDER — SODIUM CHLORIDE 0.9 % IV SOLN
500.0000 mg | INTRAVENOUS | Status: DC
Start: 2015-06-27 — End: 2015-06-28
  Administered 2015-06-27: 0.5 g via INTRAVENOUS
  Filled 2015-06-27 (×2): qty 0.5

## 2015-06-27 MED ORDER — SODIUM CHLORIDE 0.9 % IV SOLN
50.0000 mg | Freq: Once | INTRAVENOUS | Status: AC
  Administered 2015-06-28: 50 mg via INTRAVENOUS
  Filled 2015-06-27 (×2): qty 5

## 2015-06-27 NOTE — Consult Note (Signed)
Reason for Consult:Seizures Referring Physician: Sudini  CC: Seizures  HPI: Gina Andersen is an 81 y.o. female who is unable to provide any history today.  Family not present in room therefore all history obtained from the chart.  Patient has a history of having had one seizure in the past in the setting of hypoglycemia. Yesterday she had a seizure at her nursing facility, followed by second seizure. She has been treated for UTI for the past 3-4 days. She's been known to have ESBL, and was getting IV ertapenem. At baseline she is very talkative and interacts a lot, but that over the weekend she began to be very lethargic, and stopped communicating very much. She has not returned to baseline.    Past Medical History  Diagnosis Date  . Hypothyroidism 11/01/1995  . Anxiety 11/01/1995  . Diabetes mellitus type II 05/25/1996    insulin-requiring  . Hyperlipemia 06/25/2002  . Atopic dermatitis   . History of MRI of brain and brain stem 07/10/2005    w amd w/o no mass/infarct small vess dz  . Dehydration     Hosp [facility], hyperglycemia and ketoacidosis (stopped insulin)  . Pancytopenia 06/24-7/05/2007    Tissue D/O  . History of gallstones 11/21/2007    Abd U/S multip gallstones contracted GB CBD ULN Tr hydronephr R  . Acute cholecystitis 12/1/-05/20/08    [facility], s/p lap choleycystectomy CTD DM w/hypoglycemic sz in ER  . Gallstones 05/11/08    abd U/S gallstones pericholeycystic fluid [facility]  . Gastro-esophageal reflux disease without esophagitis   . Depression   . Anxiety disorder   . Dysphagia   . Hearing impaired   . Hypertension   . Carpal tunnel syndrome   . Weakness   . Anemia   . Glaucoma   . Mood disorder (HCC)   . Kidney disease     Past Surgical History  Procedure Laterality Date  . Partial hysterectomy      OV intact  . Carpel tunn      right  . Trigger finger release      left  . Ganglionectomy    . Cholecystectomy    . Abdominal hysterectomy      Family  History  Problem Relation Age of Onset  . Kidney disease Mother   . Diabetes Mother   . Hypertension Mother   . Diabetes Brother   . Stroke Brother   . Stroke Brother     CVA  . Diabetes Brother     Social History:  reports that she has never smoked. She has never used smokeless tobacco. She reports that she does not drink alcohol or use illicit drugs.  Allergies  Allergen Reactions  . Sulfa Antibiotics Other (See Comments)    Reaction:  Unknown   . Hydrocodone-Acetaminophen Other (See Comments)    Reaction:  Dizziness   . Lamictal [Lamotrigine] Other (See Comments)    Reaction:  Altered mental status   . Lyrica [Pregabalin] Other (See Comments)    Reaction:  Altered mental status   . Meloxicam Nausea And Vomiting  . Prednisone Other (See Comments)    Reaction:  Altered mental status   . Seroquel [Quetiapine] Other (See Comments)    Reaction:  Altered mental status   . Zoloft [Sertraline] Other (See Comments)    Reaction:  Altered mental status   . Penicillins Rash and Other (See Comments)    Unable to obtain enough information to answer additional questions about this medication.  Medications:  I have reviewed the patient's current medications. Prior to Admission:  Prescriptions prior to admission  Medication Sig Dispense Refill Last Dose  . bisacodyl (DULCOLAX) 10 MG suppository Place 10 mg rectally as needed for moderate constipation.   PRN at PRN  . bisacodyl (DULCOLAX) 5 MG EC tablet Take 10 mg by mouth daily as needed for moderate constipation.   PRN at PRN  . cholecalciferol (VITAMIN D) 1000 UNITS tablet Take 500 Units by mouth daily.   06/26/2015 at Unknown time  . conjugated estrogens (PREMARIN) vaginal cream Place 1 Applicatorful vaginally 3 (three) times a week. Pt applies to urethra nightly.   06/24/2015 at unknown   . dextrose (GLUTOSE) 40 % GEL Take 1 Tube by mouth once as needed for low blood sugar.   Past Month at Unknown time  . Ertapenem Sodium (INVANZ  IV) Inject 500 mg into the muscle daily. 500mg  of Invanz mixed with 1% Lidocaine.   06/26/2015 at Unknown time  . ferrous sulfate 325 (65 FE) MG tablet Take 1 tablet (325 mg total) by mouth 2 (two) times daily with a meal.  3 06/26/2015 at Unknown time  . glucagon 1 MG injection Inject 1 mg into the vein once as needed (for severe hypoglycemia).    Past Month at Unknown time  . insulin detemir (LEVEMIR) 100 UNIT/ML injection Inject 8-55 Units into the skin 2 (two) times daily. Pt uses 55 units in the morning and 8 units at bedtime.   06/26/2015 at Unknown time  . insulin lispro (HUMALOG) 100 UNIT/ML injection Inject 14 Units into the skin 2 (two) times daily after a meal. Pt uses after breakfast and lunch.   06/26/2015 at Unknown time  . levothyroxine (SYNTHROID, LEVOTHROID) 150 MCG tablet Take 150 mcg by mouth daily. Pt takes with one-half of a tablet.   06/26/2015 at Unknown time  . levothyroxine (SYNTHROID, LEVOTHROID) 25 MCG tablet Take 12.5 mcg by mouth daily. Pt takes with a tablet.   06/26/2015 at Unknown time  . lidocaine (XYLOCAINE) 2 % jelly Apply 1 application topically 2 (two) times daily. Pt applies to buttocks.   unknown at unknown   . LORazepam (ATIVAN) 1 MG tablet Take 1 mg by mouth every 6 (six) hours as needed (for severe agitation).   PRN at PRN  . LORazepam (ATIVAN) 2 MG/ML injection Inject 1 mg into the muscle every 6 (six) hours as needed (for severe agitation).   PRN at PRN  . mupirocin ointment (BACTROBAN) 2 % Apply 1 application topically daily. Pt applies to big toe on right foot.   Past Week at Unknown time  . Nutritional Supplements (SUPLENA/CARB STEADY PO) Take 1 Can by mouth 2 (two) times daily between meals.   06/26/2015 at Unknown time  . omeprazole (PRILOSEC) 20 MG capsule Take 20 mg by mouth daily.   06/26/2015 at Unknown time  . polyethylene glycol (MIRALAX / GLYCOLAX) packet Take 17 g by mouth daily.   06/26/2015 at Unknown time  . risperiDONE (RISPERDAL) 0.25 MG tablet  Take 0.125 mg by mouth at bedtime.    06/25/2015 at Unknown time  . travoprost, benzalkonium, (TRAVATAN) 0.004 % ophthalmic solution Place 1 drop into both eyes at bedtime.    06/25/2015 at Unknown time  . Zinc Oxide (BOUDREAUXS BUTT PASTE EX) Apply 1 application topically 3 (three) times daily as needed (for irritation).   PRN at PRN  . levothyroxine (SYNTHROID, LEVOTHROID) 75 MCG tablet Take 1 tablet (75 mcg  total) by mouth daily before breakfast. (Patient not taking: Reported on 06/26/2015)   Taking  . LORazepam (ATIVAN) 0.5 MG tablet Take 1 tablet (0.5 mg total) by mouth every 6 (six) hours as needed for anxiety. (Patient not taking: Reported on 06/26/2015) 30 tablet 0 Taking   Scheduled: . Chlorhexidine Gluconate Cloth  6 each Topical Q0600  . heparin  5,000 Units Subcutaneous 3 times per day  . lacosamide (VIMPAT) IV  100 mg Intravenous Q12H  . mupirocin ointment  1 application Nasal BID  . sodium chloride flush  3 mL Intravenous Q12H    ROS: Unable to obtain secondary to mental status  Physical Examination: Blood pressure 153/58, pulse 67, temperature 98.2 F (36.8 C), temperature source Oral, resp. rate 19, weight 52.98 kg (116 lb 12.8 oz), SpO2 95 %.  HEENT-  Normocephalic, no lesions, without obvious abnormality.  Normal external eye and conjunctiva.  Normal TM's bilaterally.  Normal auditory canals and external ears. Normal external nose, mucus membranes and septum.  Normal pharynx. Cardiovascular- S1, S2 normal, pulses palpable throughout   Lungs- chest clear, no wheezing, rales, normal symmetric air entry Abdomen- soft, non-tender; bowel sounds normal; no masses,  no organomegaly Extremities- no edema Lymph-no adenopathy palpable Musculoskeletal-no joint tenderness, deformity or swelling Skin-warm and dry, no hyperpigmentation, vitiligo, or suspicious lesions  Neurological Examination Mental Status: Alert.  Nonverbal.  Does not follow commands.   Cranial Nerves: II: Discs  unable to be visualized secondary to cooperation.  Patient blinks to bilateral confrontation.  Pupils equal, round, reactive to light and accommodation III,IV, VI: ptosis not present, extra-ocular motions grossly intact  V,VII: left facial droop VIII: unable to test IX,X: unable to test XI: unable to test XII: unable to test Motor: Moves left upper and lower extremity against gravity.  No movement noted of right upper and lower extremity against gravity.   Sensory: Grimaces to noxious stimuli throughout.  Moves left to pain as well even when applied to right side Deep Tendon Reflexes: 2+ with absent AJ's bilaterally Plantars: Right: upgoing   Left: upgoing Cerebellar: Unable to perform secondary to mental status Gait: Unable to perform secondary to mental status    Laboratory Studies:   Basic Metabolic Panel:  Recent Labs Lab 06/24/15 1200 06/26/15 1917 06/27/15 0556  NA 137 142 144  K 3.9 4.5 4.3  CL 110 117* 121*  CO2 22 17* 20*  GLUCOSE 258* 168* 112*  BUN 65* 62* 58*  CREATININE 3.27* 3.44* 2.99*  CALCIUM 9.2 8.6* 8.9    Liver Function Tests: No results for input(s): AST, ALT, ALKPHOS, BILITOT, PROT, ALBUMIN in the last 168 hours. No results for input(s): LIPASE, AMYLASE in the last 168 hours. No results for input(s): AMMONIA in the last 168 hours.  CBC:  Recent Labs Lab 06/24/15 1200 06/26/15 1917 06/27/15 0556  WBC 8.3 4.5 5.2  NEUTROABS 6.5  --   --   HGB 10.2* 9.0* 9.5*  HCT 30.4* 27.6* 28.5*  MCV 87.2 89.5 88.7  PLT 112* 100* 100*    Cardiac Enzymes:  Recent Labs Lab 06/27/15 0120 06/27/15 0556  TROPONINI 0.05* 0.05*    BNP: Invalid input(s): POCBNP  CBG:  Recent Labs Lab 06/26/15 1923  GLUCAP 157*    Microbiology: Results for orders placed or performed during the hospital encounter of 06/26/15  Blood culture (routine x 2)     Status: None (Preliminary result)   Collection Time: 06/26/15  9:09 PM  Result Value Ref Range Status  Specimen Description BLOOD RIGHT ANTECUBITAL  Final   Special Requests BOTTLES DRAWN AEROBIC AND ANAEROBIC  Final   Culture NO GROWTH < 12 HOURS  Final   Report Status PENDING  Incomplete  Blood culture (routine x 2)     Status: None (Preliminary result)   Collection Time: 06/26/15  9:09 PM  Result Value Ref Range Status   Specimen Description BLOOD LEFT HAND  Final   Special Requests BOTTLES DRAWN AEROBIC AND ANAEROBIC  Final   Culture NO GROWTH < 12 HOURS  Final   Report Status PENDING  Incomplete  MRSA PCR Screening     Status: Abnormal   Collection Time: 06/27/15 12:40 AM  Result Value Ref Range Status   MRSA by PCR POSITIVE (A) NEGATIVE Final    Comment: CRITICAL RESULT CALLED TO, READ BACK BY AND VERIFIED WITH: ADRIAN WHITE ON 06/27/15 AT 0242 [facility]        The GeneXpert MRSA Assay (FDA approved for NASAL specimens only), is one component of a comprehensive MRSA colonization surveillance program. It is not intended to diagnose MRSA infection nor to guide or monitor treatment for MRSA infections.     Coagulation Studies: No results for input(s): LABPROT, INR in the last 72 hours.  Urinalysis:  Recent Labs Lab 06/24/15 1725 06/26/15 2053  COLORURINE YELLOW* YELLOW*  LABSPEC 1.012 1.012  PHURINE 5.0 5.0  GLUCOSEU 50* 50*  HGBUR 2+* 2+*  BILIRUBINUR NEGATIVE NEGATIVE  KETONESUR NEGATIVE NEGATIVE  PROTEINUR 100* 100*  NITRITE NEGATIVE NEGATIVE  LEUKOCYTESUR 3+* 3+*    Lipid Panel:     Component Value Date/Time   CHOL 114 06/27/2015 0556   TRIG 121 06/27/2015 0556   HDL 32* 06/27/2015 0556   CHOLHDL 3.6 06/27/2015 0556   VLDL 24 06/27/2015 0556   LDLCALC 58 06/27/2015 0556    HgbA1C:  Lab Results  Component Value Date   HGBA1C 10.6* 01/14/2015    Urine Drug Screen:     Component Value Date/Time   LABOPIA NEGATIVE 08/10/2012 1940   LABBENZ NEGATIVE 08/10/2012 1940   AMPHETMU NEGATIVE 08/10/2012 1940   THCU NEGATIVE 08/10/2012 1940    LABBARB NEGATIVE 08/10/2012 1940    Alcohol Level: No results for input(s): ETH in the last 168 hours.  Other results: EKG: sinus rhythm at 93 bpm.  Imaging: Ct Head Wo Contrast  06/26/2015  CLINICAL DATA:  Witnessed seizure during dinner. CVA symptoms over the past several days. EXAM: CT HEAD WITHOUT CONTRAST TECHNIQUE: Contiguous axial images were obtained from the base of the skull through the vertex without intravenous contrast. COMPARISON:  09/26/2013 FINDINGS: Chronic right periventricular white matter lacunar infarct measuring 5 mm on image 19 series 2, although an interval finding from May, 2015. Otherwise, the brainstem, cerebellum, cerebral peduncles, thalami, basal ganglia, basilar cisterns, and ventricular system appear within normal limits. Periventricular white matter and corona radiata hypodensities favor chronic ischemic microvascular white matter disease. No intracranial hemorrhage, mass lesion, or acute CVA. Chronic ethmoid, and left sphenoid sinusitis observed. Old deformity of the left zygomatic arch. There is atherosclerotic calcification of the cavernous carotid arteries bilaterally. IMPRESSION: 1. Chronic microvascular white matter disease. Chronic lacunar infarct in the right periventricular white matter. No acute intracranial findings are apparent. 2. Chronic ethmoid and left sphenoid sinusitis. Electronically Signed   By: Gaylyn Rong M.D.   On: 06/26/2015 19:49   Dg Chest Portable 1 View  06/26/2015  CLINICAL DATA:  Seizure. EXAM: PORTABLE CHEST 1 VIEW COMPARISON:  Chest radiograph  01/16/2015, abdominal CT 05/16/2015 FINDINGS: The cardiomediastinal contours are unchanged, mild cardiomegaly. Mild vascular congestion without alveolar edema. No consolidation, pleural effusion, or pneumothorax. No acute osseous abnormalities are seen. Air in the left upper quadrant within the stomach and out landing stool within the colon, similar in appearance to prior abdominal CT 05/16/2015.  IMPRESSION: Stable mild cardiomegaly.  Vascular congestion, new. Electronically Signed   By: Rubye Oaks M.D.   On: 06/26/2015 19:55     Assessment/Plan: 81 year old female presenting with recurrent seizure activity.  Patient being treated for UTI.  Renal function compromised but improving.  Loaded with Vimpat in the ED and kept on maintenance.  Head CT personally reviewed but shows no acute changes.  Patient felt to be post-ictal in the ED but patient has not returned to baseline.  Neurological examination has focal features as well.  Further work up recommended.   Recommendations: 1.  EEG STAT 2.  MRI of the brain without contrast 3.  Seizure precautions 4.  Vimpat  IV q 12hours   Thana Farr, MD Neurology [phone] 06/27/2015, 10:34 AM

## 2015-06-27 NOTE — Progress Notes (Signed)
Pt rested quietly today. Has not appeared to be in any pain or discomfort. Continues to be difficult to do neuro checks as patient is not speaking or following commands. She is alert, making eye contact and smiling at me. Positioned myself where patient can read lips, speaking slow and clear - pt still not following commands. No signs of seizure activity. Will continue to monitor.  Clayborne Dana

## 2015-06-27 NOTE — Progress Notes (Signed)
PT Attempt Note  Patient Details Name: Gina Andersen MRN: [id number] DOB: Apr 26, 1935   Cancelled Treatment:    Reason Eval/Treat Not Completed: Patient at procedure or test/unavailable. Chart reviewed and RN consulted. Attempted to see pt at 13:45 however currently out of room for MRI. RN reports pt currently not following commands and not appropriate for PT evaluation. Will hold evaluation and attempt on later date as pt is available/appropriate.  Sharalyn Ink Huprich PT, DPT   Huprich,Jason 06/27/2015, 3:22 PM

## 2015-06-27 NOTE — Progress Notes (Signed)
Speech therapy note: received order, reviewed chart notes. Consulted NSG re: pt's status. NSG reported pt was less responsive and not following any commands - often stared at NSG when asked her a task. Due to her questionable status of post ictal state, NSG did not feel it was the appropriate time for a BSE; SLP agreed. ST services will f/u tomorrow w/ BSE. Pt is currently NPO w/ IV meds; rec. Oral care while NPO. NSG agreed.

## 2015-06-27 NOTE — Progress Notes (Signed)
Daughter, Claud Kelp called for update. Stated patient has not been speaking since Monday. According to daughter patient is already hard of hearing but does read lips and typically communicates well. Also informed this RN that patient has been wheelchair bound since last summer. Stated family may be by at some point later today to visit.

## 2015-06-27 NOTE — Progress Notes (Signed)
[facility] [place] [facility]   PATIENT NAME: Gina Andersen    MR#:  [id number]  DATE OF BIRTH:  11-Jan-1935  SUBJECTIVE:  CHIEF COMPLAINT:   Chief Complaint  Patient presents with  . Seizures   Pt awake but non verbal  REVIEW OF SYSTEMS:    Review of Systems  Unable to perform ROS: mental status change   DRUG ALLERGIES:   Allergies  Allergen Reactions  . Sulfa Antibiotics Other (See Comments)    Reaction:  Unknown   . Hydrocodone-Acetaminophen Other (See Comments)    Reaction:  Dizziness   . Lamictal [Lamotrigine] Other (See Comments)    Reaction:  Altered mental status   . Lyrica [Pregabalin] Other (See Comments)    Reaction:  Altered mental status   . Meloxicam Nausea And Vomiting  . Prednisone Other (See Comments)    Reaction:  Altered mental status   . Seroquel [Quetiapine] Other (See Comments)    Reaction:  Altered mental status   . Zoloft [Sertraline] Other (See Comments)    Reaction:  Altered mental status   . Penicillins Rash and Other (See Comments)    Unable to obtain enough information to answer additional questions about this medication.      VITALS:  Blood pressure 151/74, pulse 80, temperature 98.9 F (37.2 C), temperature source Oral, resp. rate 18, weight 52.98 kg (116 lb 12.8 oz), SpO2 96 %.  PHYSICAL EXAMINATION:   Physical Exam  GENERAL:  81 y.o.-year-old patient lying in the bed with no acute distress.  EYES: Pupils equal, round, reactive to light and accommodation. No scleral icterus. HEENT: Head atraumatic, normocephalic. Oropharynx and nasopharynx clear.  NECK:  Supple, no jugular venous distention. No thyroid enlargement, no tenderness.  LUNGS: Normal breath sounds bilaterally, no wheezing, rales, rhonchi. No use of accessory muscles of respiration.  CARDIOVASCULAR: S1, S2 normal. No murmurs, rubs, or gallops.  ABDOMEN: Soft, nontender, nondistended. Bowel sounds present. No organomegaly or mass.   EXTREMITIES: No cyanosis, clubbing or edema b/l.    NEUROLOGIC: Pupils reactive bilateral. Moves left upper and lower ext. Not right. PSYCHIATRIC: The patient is alert and awake. Confused SKIN: No obvious rash, lesion, or ulcer.    LABORATORY PANEL:   CBC  Recent Labs Lab 06/27/15 0556  WBC 5.2  HGB 9.5*  HCT 28.5*  PLT 100*   ------------------------------------------------------------------------------------------------------------------  Chemistries   Recent Labs Lab 06/27/15 0556  NA 144  K 4.3  CL 121*  CO2 20*  GLUCOSE 112*  BUN 58*  CREATININE 2.99*  CALCIUM 8.9   ------------------------------------------------------------------------------------------------------------------  Cardiac Enzymes  Recent Labs Lab 06/27/15 1151  TROPONINI 0.06*   ------------------------------------------------------------------------------------------------------------------  RADIOLOGY:  Ct Head Wo Contrast  06/26/2015  CLINICAL DATA:  Witnessed seizure during dinner. CVA symptoms over the past several days. EXAM: CT HEAD WITHOUT CONTRAST TECHNIQUE: Contiguous axial images were obtained from the base of the skull through the vertex without intravenous contrast. COMPARISON:  09/26/2013 FINDINGS: Chronic right periventricular white matter lacunar infarct measuring 5 mm on image 19 series 2, although an interval finding from May, 2015. Otherwise, the brainstem, cerebellum, cerebral peduncles, thalami, basal ganglia, basilar cisterns, and ventricular system appear within normal limits. Periventricular white matter and corona radiata hypodensities favor chronic ischemic microvascular white matter disease. No intracranial hemorrhage, mass lesion, or acute CVA. Chronic ethmoid, and left sphenoid sinusitis observed. Old deformity of the left zygomatic arch. There is atherosclerotic calcification of the cavernous carotid arteries bilaterally. IMPRESSION: 1. Chronic microvascular  white  matter disease. Chronic lacunar infarct in the right periventricular white matter. No acute intracranial findings are apparent. 2. Chronic ethmoid and left sphenoid sinusitis. Electronically Signed   By: Gaylyn Rong M.D.   On: 06/26/2015 19:49   Dg Chest Portable 1 View  06/26/2015  CLINICAL DATA:  Seizure. EXAM: PORTABLE CHEST 1 VIEW COMPARISON:  Chest radiograph 01/16/2015, abdominal CT 05/16/2015 FINDINGS: The cardiomediastinal contours are unchanged, mild cardiomegaly. Mild vascular congestion without alveolar edema. No consolidation, pleural effusion, or pneumothorax. No acute osseous abnormalities are seen. Air in the left upper quadrant within the stomach and out landing stool within the colon, similar in appearance to prior abdominal CT 05/16/2015. IMPRESSION: Stable mild cardiomegaly.  Vascular congestion, new. Electronically Signed   By: Rubye Oaks M.D.   On: 06/26/2015 19:55     ASSESSMENT AND PLAN:   # Seizures  Possibly likely from CVA. MRI pending. EEG showed epileptiform waves. On Vimpat. Appreciate neurology help. Right weakness could also be from Todds paralysis. Await MRI  # Acute encephalopathy due to CVA or post-ictal state.  # UTI (lower urinary tract infection) - continue to treat with IV ertapenem, send urine culture.  # ARF over CKD 4 - avoid nephrotoxins, monitor very closely. Patient is not on dialysis. Start IVF  # DM (diabetes mellitus), type 2, uncontrolled (HCC)  SSI Pt is NPO at this time  # HTN (hypertension) - monitor, when necessary antihypertensives  # Hypothyroidism Levothyroxine  # DVT prophylaxis Heparin SQ  All the records are reviewed and case discussed with Care Management/Social Workerr. Management plans discussed with the patient, family and they are in agreement.  CODE STATUS: FULL  DVT Prophylaxis: SCDs  TOTAL TIME TAKING CARE OF THIS PATIENT: 35 minutes.   POSSIBLE D/C IN 2-3 DAYS, DEPENDING ON CLINICAL  CONDITION.   Milagros Loll R M.D on 06/27/2015 at 2:06 PM  Between 7am to 6pm - Pager - [phone]  After 6pm go to [email or web address] - password [email or web address] [facility]  [facility]  Office  [phone]  CC: Primary care physician; Nelda Bucks., MD    Note: This dictation was prepared with Dragon dictation along with smaller phrase technology. Any transcriptional errors that result from this process are unintentional.

## 2015-06-27 NOTE — Progress Notes (Signed)
OT Cancellation Note  Patient Details Name: Gina Andersen MRN: [id number] DOB: Jul 14, 1934   Cancelled Treatment:    Reason Eval/Treat Not Completed: Patient at procedure or test/ unavailable Per NSG pt is at MRI and she will smile when talked to but is not able to follow simple commands to participate in evaluation.  Will attempt evaluation again tomorrow after MRI results are in.  Susanne Borders, OTR/L ascom [phone]   Addasyn Mcbreen 06/27/2015, 2:03 PM

## 2015-06-27 NOTE — Care Management (Signed)
Reported during progression that patient is from [facility] [employer] [employer].  CSW aware.

## 2015-06-27 NOTE — Progress Notes (Signed)
Dr. Elpidio Anis notified that pt's daughter, Raynelle Fanning, is requesting an update. MD to call her.

## 2015-06-27 NOTE — Procedures (Signed)
ELECTROENCEPHALOGRAM REPORT   Patient: Gina Andersen       Room #: 257A-AA EEG No. ID: [id number] Age: 81 y.o.        Sex: female Referring Physician: Sudini Report Date:  06/27/2015        Interpreting Physician: Thana Farr  History: Gina Andersen is an 81 y.o. female presenting after two breakthrough seizures   Medications:  Scheduled: . Chlorhexidine Gluconate Cloth  6 each Topical Q0600  . heparin  5,000 Units Subcutaneous 3 times per day  . lacosamide (VIMPAT) IV  50 mg Intravenous Q12H  . mupirocin ointment  1 application Nasal BID  . sodium chloride flush  3 mL Intravenous Q12H    Conditions of Recording:  This is a 16 channel EEG carried out with the patient in the awake state.  Description:  The waking background activity is poorly organized and discontinuous. It consists of bursts of high voltage spike and slow wave activity alternating with periods of attenuation. The burst activity is short lived and consists of sharp activity with phase reversal at F4.  There is spread to the left hemisphere.  The periods of attenuation last up to 2 seconds. This discontinuous activity is maintained for the majority of the tracing. There are periods though that are dominated by poorly organized low to moderate voltage activity without intervening periods of attenuation.  The sharp activity persists though and is seen frequently, again with phase reversal at [facility] and spread to the left hemisphere.   Hyperventilation was not performed.  Intermittent photic stimulation was performed but failed to illicit any change in the tracing.    IMPRESSION: This is an abnormal electroencephalogram secondary to burst suppression activity and bifrontal sharp activity with phase reversal at F4.  This finding is consistent with the patient's recent presentation of seizure activity and suggests a post-ictal state.     Thana Farr, MD Neurology [phone] 06/27/2015, 12:01 PM

## 2015-06-28 ENCOUNTER — Inpatient Hospital Stay: Payer: Medicare Other

## 2015-06-28 DIAGNOSIS — I639 Cerebral infarction, unspecified: Principal | ICD-10-CM

## 2015-06-28 LAB — BASIC METABOLIC PANEL
ANION GAP: 6 (ref 5–15)
BUN: 60 mg/dL — ABNORMAL HIGH (ref 6–20)
CO2: 18 mmol/L — AB (ref 22–32)
Calcium: 8.8 mg/dL — ABNORMAL LOW (ref 8.9–10.3)
Chloride: 117 mmol/L — ABNORMAL HIGH (ref 101–111)
Creatinine, Ser: 2.75 mg/dL — ABNORMAL HIGH (ref 0.44–1.00)
GFR calc Af Amer: 18 mL/min — ABNORMAL LOW (ref >60)
GFR, EST NON AFRICAN AMERICAN: 15 mL/min — AB (ref >60)
GLUCOSE: 176 mg/dL — AB (ref 65–99)
POTASSIUM: 4.1 mmol/L (ref 3.5–5.1)
Sodium: 141 mmol/L (ref 135–145)

## 2015-06-28 LAB — GLUCOSE, CAPILLARY
GLUCOSE-CAPILLARY: 176 mg/dL — AB (ref 65–99)
GLUCOSE-CAPILLARY: 187 mg/dL — AB (ref 65–99)
GLUCOSE-CAPILLARY: 100 mg/dL — AB (ref 65–99)
Glucose-Capillary: 143 mg/dL — ABNORMAL HIGH (ref 65–99)
Glucose-Capillary: 205 mg/dL — ABNORMAL HIGH (ref 65–99)
Glucose-Capillary: 207 mg/dL — ABNORMAL HIGH (ref 65–99)

## 2015-06-28 LAB — CBC
HEMATOCRIT: 27.3 % — AB (ref 35.0–47.0)
Hemoglobin: 9.1 g/dL — ABNORMAL LOW (ref 12.0–16.0)
MCH: 29.3 pg (ref 26.0–34.0)
MCHC: 33.1 g/dL (ref 32.0–36.0)
MCV: 88.5 fL (ref 80.0–100.0)
Platelets: 106 10*3/uL — ABNORMAL LOW (ref 150–440)
RBC: 3.09 MIL/uL — AB (ref 3.80–5.20)
RDW: 13.4 % (ref 11.5–14.5)
WBC: 6.2 10*3/uL (ref 3.6–11.0)

## 2015-06-28 LAB — URINE CULTURE

## 2015-06-28 MED ORDER — ASPIRIN 300 MG RE SUPP
300.0000 mg | Freq: Every day | RECTAL | Status: DC
  Administered 2015-06-28 – 2015-06-29 (×2): 300 mg via RECTAL
  Filled 2015-06-28 (×2): qty 1

## 2015-06-28 MED ORDER — LORAZEPAM 2 MG/ML IJ SOLN
2.0000 mg | INTRAMUSCULAR | Status: DC | PRN
  Administered 2015-06-28 – 2015-06-29 (×4): 2 mg via INTRAVENOUS
  Filled 2015-06-28 (×4): qty 1

## 2015-06-28 MED ORDER — SODIUM CHLORIDE 0.9 % IV SOLN
200.0000 mg | INTRAVENOUS | Status: AC
  Administered 2015-06-28: 200 mg via INTRAVENOUS
  Filled 2015-06-28: qty 20

## 2015-06-28 NOTE — Progress Notes (Addendum)
Pt exhibited seizure activity lasting less than one minute. Whole body shaking, murmuring, leaning towards right side. Pt remained nonverbal and lethargic after seizure. MD Dr. Anne Hahn notified, dosage of IV lacosamide increased per MD order. RN will continue to monitor. Syliva Overman, RN

## 2015-06-28 NOTE — Clinical Social Work Note (Signed)
Clinical Social Work Assessment  Patient Details  Name: Gina Andersen MRN: [id number] Date of Birth: May 02, 1935  Date of referral:  06/28/15               Reason for consult:  Discharge Planning                Permission sought to share information with:  Family Supports Permission granted to share information::     Name::        Agency::     Relationship::   Claud Kelp (daughter) [phone])  Contact Information:     Housing/Transportation Living arrangements for the past 2 months:  Skilled Nursing Facility [facility] ) Source of Information:  Adult Children Claud Kelp (daughter) [phone]) Patient Interpreter Needed:  None Criminal Activity/Legal Involvement Pertinent to Current Situation/Hospitalization:  No - Comment as needed Significant Relationships:  Adult Children Claud Kelp (daughter) [phone]) Lives with:  Facility Resident [facility] ) Do you feel safe going back to the place where you live?  Yes Need for family participation in patient care:  Yes (Comment) Claud Kelp (daughter) [phone])  Care giving concerns:  Patient is a LTC resident at [facility].    Social Worker assessment / plan: CSW was consulted by RN during progression and infomred that patient is a resident at [facility]. CSW went to speak to patient and she was not easily aroused. CSW contacted patient's daughter Raynelle Fanning and discusssed discharge plans. CSW explained her role. Per Raynelle Fanning patient is from [facility]- LTC. She sated that patietn will return via EMS. She reports that she is on her way to the hospital today. Discussed concerns that patient is hungry and needs to eat. She reports that patient cannot walk at baseline. Verbal Permission to contact WOM to coordinate discharge plans.  CSW contacted Information systems manager- admissions coordinator at [facility]. She reports that patient is a LTC resident and can return at discharge. Per Gavin Pound patient does not walk at baseline.   FL2/ PASRR completed and faxed  to [facility]. CSW will continue to follow and assist.   Employment status:  Retired Health and safety inspector:  Medicare, Medicaid In [place] PT Recommendations:  Not assessed at this time Information / Referral to community resources:  Skilled Nursing Facility  Patient/Family's Response to care:  Patient's daughter Raynelle Fanning is in agreement for patient to return to [facility] at discharge via EMS.   Patient/Family's Understanding of and Emotional Response to Diagnosis, Current Treatment, and Prognosis:  Patient's daughter Raynelle Fanning understands CSW's role and is appreciative for CSW's assistance.   Emotional Assessment Appearance:  Appears stated age Attitude/Demeanor/Rapport:  Lethargic Affect (typically observed):  Unable to Assess Orientation:   (Unable to access. ) Alcohol / Substance use:  Not Applicable Psych involvement (Current and /or in the community):  No (Comment)  Discharge Needs  Concerns to be addressed:  Discharge Planning Concerns Readmission within the last 30 days:    Current discharge risk:  Chronically ill Barriers to Discharge:  Continued Medical Work up   [employer], LCSW 06/28/2015, 3:49 PM

## 2015-06-28 NOTE — Procedures (Signed)
ELECTROENCEPHALOGRAM REPORT   Patient: Gina Andersen       Room #: 257A-AA EEG No. ID: [id number] Age: 81 y.o.        Sex: female Referring Physician: Sudini Report Date:  06/28/2015        Interpreting Physician: Thana Farr  History: Gina Andersen is an 81 y.o. female with altered mental status s/p breakthrough seizures  Medications:  Scheduled: . antiseptic oral rinse  7 mL Mouth Rinse BID  . aspirin  300 mg Rectal Daily  . Chlorhexidine Gluconate Cloth  6 each Topical Q0600  . heparin  5,000 Units Subcutaneous 3 times per day  . insulin aspart  0-9 Units Subcutaneous 6 times per day  . lacosamide (VIMPAT) IV  100 mg Intravenous Q12H  . mupirocin ointment  1 application Nasal BID  . sodium chloride flush  3 mL Intravenous Q12H    Conditions of Recording:  This is a 16 channel EEG carried out with the patient in the poorly responsive state.  Description:  The background activity is of low voltage and continuous but poorly organized.  For the majority of the recording it consists of a mixture of frequencies with theta and delta rhythms dominating the posterior background rhythm.   At times alpha activity was noted in the central and temporal regions although theta activity was most prominent.  Frequent bifrontal sharp activty was noted as well.   No stage II sleep was obtained.     Hyperventilation was not performed.   Intermittent photic stimulation was performed but failed to illicit any change in the tracing.    IMPRESSION: This EEG is characterized by slowing which is consistent with normal drowse.  Can not rule out the possibility of slowing related to general cerebral disturbance such as a post-ictal state.  Clinical correlation recommended.  Bifrontal sharp activity continues to be present but is improved from the electroencephalogram of 06/27/2015.       Thana Farr, MD Neurology [phone] 06/28/2015, 2:13 PM

## 2015-06-28 NOTE — NC FL2 (Signed)
[place] MEDICAID FL2 LEVEL OF CARE SCREENING TOOL     IDENTIFICATION  Patient Name: Gina Andersen Birthdate: 02/08/1935 Sex: female Admission Date (Current Location): 06/26/2015  [facility] and [place] Number:  [address]  ([id number]) Facility and Address:  [facility], [address], [place], [place] [address]      Provider Number: [id number]  Attending Physician Name and Address:  Milagros Loll, MD  Relative Name and Phone Number:       Current Level of Care: Hospital Recommended Level of Care: Skilled Nursing Facility Prior Approval Number:    Date Approved/Denied:   PASRR Number:  [id number])  Discharge Plan: SNF    Current Diagnoses: Patient Active Problem List   Diagnosis Date Noted  . Seizures (HCC) 06/26/2015  . HTN (hypertension) 06/26/2015  . CKD (chronic kidney disease), stage V (HCC) 06/26/2015  . Anemia due to stage 5 chronic kidney disease (HCC) 04/16/2015  . Disease of thyroid gland 02/08/2015  . Hemorrhoid 02/08/2015  . Glaucoma 02/08/2015  . GERD (gastroesophageal reflux disease) 02/08/2015  . Urinary retention with incomplete bladder emptying 01/13/2015  . Pressure ulcer 01/12/2015  . Sepsis due to urinary tract infection (HCC) 01/11/2015  . Acute kidney injury (HCC) 01/11/2015  . Acute encephalopathy 01/11/2015  . DM (diabetes mellitus), type 2, uncontrolled (HCC) 01/11/2015  . Chronic anemia 01/11/2015  . Anemia 01/11/2015  . Nonspecific (abnormal) findings on radiological and other examination of gastrointestinal tract 12/07/2012  . Septic shock(785.52) 12/04/2012  . UTI (lower urinary tract infection) 12/04/2012  . Colitis 12/04/2012  . B12 deficiency 08/04/2012  . Abnormal LFTs 12/22/2011  . Diabetes mellitus, type 2 (HCC) 12/21/2011  . Adynamia 12/21/2011  . DERMATITIS, ATOPIC 06/27/2010  . HEMORRHOIDS, WITH BLEEDING 11/27/2008  . Pain in limb 06/01/2008  . PANCYTOPENIA 11/28/2007  . MIXED CONNECTIVE TISSUE  DISEASE 11/28/2007  . HEPATITIS B, HX OF 11/28/2007  . LEUKOPENIA, MILD 12/01/2006  . DISORDER, MENOPAUSAL NOS 09/08/2006  . HLD (hyperlipidemia) 06/25/2002  . DIABETES MELLITUS, TYPE II 05/25/1996  . Hypothyroidism 11/01/1995  . ANXIETY 11/01/1995    Orientation RESPIRATION BLADDER Height & Weight     Self  Normal Incontinent Weight: 116 lb 12.8 oz (52.98 kg) Height:   (154.9 cm)  BEHAVIORAL SYMPTOMS/MOOD NEUROLOGICAL BOWEL NUTRITION STATUS   (None ) Convulsions/Seizures (Seizures) Incontinent Diet (NPO time specified )  AMBULATORY STATUS COMMUNICATION OF NEEDS Skin   Extensive Assist Verbally Normal                       Personal Care Assistance Level of Assistance  Bathing, Feeding, Dressing Bathing Assistance: Limited assistance Feeding assistance: Limited assistance Dressing Assistance: Limited assistance     Functional Limitations Info  Sight, Hearing, Speech Sight Info: Adequate Hearing Info: Adequate Speech Info: Adequate    SPECIAL CARE FACTORS FREQUENCY                       Contractures      Additional Factors Info  Code Status, Allergies, Insulin Sliding Scale Code Status Info:  (Full Code ) Allergies Info:  (Sulfa Antibiotics, Hydrocodone-acetaminophen, Lamictal, Lyrica, Meloxicam, Prednisone, Seroquel, Zoloft, Penicillins)   Insulin Sliding Scale Info:  (insulin aspart (novoLOG) injection 0-9 Units- 6 times daily )   Contact Precautions- MRSA        Current Medications (06/28/2015):  This is the current hospital active medication list Current Facility-Administered Medications  Medication Dose Route Frequency Provider Last Rate Last  Dose  . 0.45 % sodium chloride infusion   Intravenous Continuous Milagros Loll, MD 75 mL/hr at 06/28/15 [phone]    . antiseptic oral rinse (CPC / CETYLPYRIDINIUM CHLORIDE 0.05%) solution 7 mL  7 mL Mouth Rinse BID Milagros Loll, MD   7 mL at 06/28/15 1000  . aspirin suppository 300 mg  300 mg Rectal Daily  Thana Farr, MD      . Chlorhexidine Gluconate Cloth 2 % PADS 6 each  6 each Topical Q0600 Oralia Manis, MD   6 each at 06/28/15 [phone]  . heparin injection 5,000 Units  5,000 Units Subcutaneous 3 times per day Oralia Manis, MD   5,000 Units at 06/28/15 0436  . insulin aspart (novoLOG) injection 0-9 Units  0-9 Units Subcutaneous 6 times per day Milagros Loll, MD   3 Units at 06/28/15 1142  . lacosamide (VIMPAT) 100 mg in sodium chloride 0.9 % 25 mL IVPB  100 mg Intravenous Q12H Oralia Manis, MD   100 mg at 06/28/15 1108  . LORazepam (ATIVAN) injection 2 mg  2 mg Intravenous Q4H PRN Ihor Austin, MD   2 mg at 06/28/15 0651  . mupirocin ointment (BACTROBAN) 2 % 1 application  1 application Nasal BID Oralia Manis, MD   1 application at 06/28/15 1108  . ondansetron (ZOFRAN) tablet 4 mg  4 mg Oral Q6H PRN Oralia Manis, MD       Or  . ondansetron [facility]) injection 4 mg  4 mg Intravenous Q6H PRN Oralia Manis, MD      . sodium chloride flush (NS) 0.9 % injection 3 mL  3 mL Intravenous Q12H Oralia Manis, MD   3 mL at 06/28/15 1000     Discharge Medications: Please see discharge summary for a list of discharge medications.  Relevant Imaging Results:  Relevant Lab Results:   Additional Information  (SSN: [id number])  Verta Ellen Wendolyn Raso, LCSW

## 2015-06-28 NOTE — Progress Notes (Signed)
SLP Cancellation Note  Patient Details Name: Gina Andersen MRN: [id number] DOB: [birth year]   Cancelled treatment:       Reason Eval/Treat Not Completed: Medical issues which prohibited therapy Per chart review and nsg report pt is currently at a decreased level of consciousness. Pt not appropriate for PO trials/beside swallow eval at this time. Will re-attempt when appropriate.    Cogswell,Maaran 06/28/2015, 2:36 PM

## 2015-06-28 NOTE — Progress Notes (Signed)
Patient had 3 more seizure like activity lasting 30 secs-1 min within 20 minutes of each other. Patient given ativan prn, Dr. Amado Coe (on call dr) notified. Stated to page neuro. No new orders given, neuro paged at this time. Report given to night shift nurse Cassie. Patient stable at this time. Trudee Kuster

## 2015-06-28 NOTE — Progress Notes (Addendum)
Pt found having seizure, witnessed for less than one minute. Left side of body shaking more than right, murmuring, leaning towards right side. Pt remained nonverbal and lethargic after seizure. MD Dr. Tobi Bastos notified, orders given for prn Ativan for seizure activity. RN will continue to monitor. Syliva Overman, RN

## 2015-06-28 NOTE — Progress Notes (Signed)
MD Dr. Thad Ranger notified of recent seizure activity. Orders given for  Vimpat IV stat and to hold next scheduled dose of Vimpat at 2200. RN will continue to monitor. Syliva Overman, RN

## 2015-06-28 NOTE — Clinical Social Work Placement (Deleted)
   CLINICAL SOCIAL WORK PLACEMENT  NOTE  Date:  06/28/2015  Patient Details  Name: HANIFAH ROYSE MRN: [id number] Date of Birth: June 09, 1934  Clinical Social Work is seeking post-discharge placement for this patient at the   level of care (*CSW will initial, date and re-position this form in  chart as items are completed):      Patient/family provided with [facility] Clinical Social Work Department's list of facilities offering this level of care within the geographic area requested by the patient (or if unable, by the patient's family).      Patient/family informed of their freedom to choose among providers that offer the needed level of care, that participate in Medicare, Medicaid or managed care program needed by the patient, have an available bed and are willing to accept the patient.      Patient/family informed of [facility]'s ownership interest in [facility] and [facility], as well as of the fact that they are under no obligation to receive care at these facilities.  PASRR submitted to EDS on       PASRR number received on       Existing PASRR number confirmed on       FL2 transmitted to all facilities in geographic area requested by pt/family on       FL2 transmitted to all facilities within larger geographic area on       Patient informed that his/her managed care company has contracts with or will negotiate with certain facilities, including the following:            Patient/family informed of bed offers received.  Patient chooses bed at       Physician recommends and patient chooses bed at      Patient to be transferred to   on  .  Patient to be transferred to facility by       Patient family notified on   of transfer.  Name of family member notified:        PHYSICIAN       Additional Comment:    _______________________________________________ Idamae Lusher, LCSW 06/28/2015, 3:49 PM

## 2015-06-28 NOTE — Progress Notes (Signed)
PT Attempt Note  Patient Details Name: Gina Andersen MRN: [id number] DOB: [birth year]   Cancelled Treatment:    Reason Eval/Treat Not Completed: Patient's level of consciousness. Chart reviewed and RN consulted. Pt with seizures overnight. Currently unresponsive to commands. Not appropriate for PT evaluation. Will attempt on later date/time as pt is appropriate.  Sharalyn Ink Huprich PT, DPT   Huprich,Jason 06/28/2015, 11:38 AM

## 2015-06-28 NOTE — Progress Notes (Signed)
Patient had 2 GTCS overnight < 1 minutes. Ativan given. Discussed with Dr. Thad Ranger. Increase vimpat to 100 mg IV BID for recurrent seizures.

## 2015-06-28 NOTE — Progress Notes (Signed)
[facility] [place] [facility]   PATIENT NAME: Gina Andersen    MR#:  [id number]  DATE OF BIRTH:  [birth year]  SUBJECTIVE:  CHIEF COMPLAINT:   Chief Complaint  Patient presents with  . Seizures   Pt drowzy. Responds to pain. Had 2 episodes of GTCS < 1 minute overnight.  Has twitching RUE  REVIEW OF SYSTEMS:    Review of Systems  Unable to perform ROS: mental status change   DRUG ALLERGIES:   Allergies  Allergen Reactions  . Sulfa Antibiotics Other (See Comments)    Reaction:  Unknown   . Hydrocodone-Acetaminophen Other (See Comments)    Reaction:  Dizziness   . Lamictal [Lamotrigine] Other (See Comments)    Reaction:  Altered mental status   . Lyrica [Pregabalin] Other (See Comments)    Reaction:  Altered mental status   . Meloxicam Nausea And Vomiting  . Prednisone Other (See Comments)    Reaction:  Altered mental status   . Seroquel [Quetiapine] Other (See Comments)    Reaction:  Altered mental status   . Zoloft [Sertraline] Other (See Comments)    Reaction:  Altered mental status   . Penicillins Rash and Other (See Comments)    Unable to obtain enough information to answer additional questions about this medication.      VITALS:  Blood pressure 136/60, pulse 71, temperature 98.5 F (36.9 C), temperature source Oral, resp. rate 16, height  (1.549 m), weight 52.98 kg (116 lb 12.8 oz), SpO2 92 %.  PHYSICAL EXAMINATION:   Physical Exam  GENERAL:  81 y.o.-year-old patient lying in the bed with no acute distress.  EYES: Pupils equal, round, reactive to light and accommodation. No scleral icterus. HEENT: Head atraumatic, normocephalic. Oropharynx and nasopharynx clear.  NECK:  Supple, no jugular venous distention. No thyroid enlargement, no tenderness.  LUNGS: Normal breath sounds bilaterally, no wheezing, rales, rhonchi. No use of accessory muscles of respiration.  CARDIOVASCULAR: S1, S2 normal. No murmurs, rubs, or gallops.   ABDOMEN: Soft, nontender, nondistended. Bowel sounds present. No organomegaly or mass.  EXTREMITIES: No cyanosis, clubbing or edema b/l.    NEUROLOGIC: Pupils reactive bilateral. Flaccid right PSYCHIATRIC: The patient is drowzy SKIN: No obvious rash, lesion, or ulcer.    LABORATORY PANEL:   CBC  Recent Labs Lab 06/28/15 0552  WBC 6.2  HGB 9.1*  HCT 27.3*  PLT 106*   ------------------------------------------------------------------------------------------------------------------  Chemistries   Recent Labs Lab 06/28/15 0552  NA 141  K 4.1  CL 117*  CO2 18*  GLUCOSE 176*  BUN 60*  CREATININE 2.75*  CALCIUM 8.8*   ------------------------------------------------------------------------------------------------------------------  Cardiac Enzymes  Recent Labs Lab 06/27/15 1151  TROPONINI 0.06*   ------------------------------------------------------------------------------------------------------------------  RADIOLOGY:  Ct Head Wo Contrast  06/26/2015  CLINICAL DATA:  Witnessed seizure during dinner. CVA symptoms over the past several days. EXAM: CT HEAD WITHOUT CONTRAST TECHNIQUE: Contiguous axial images were obtained from the base of the skull through the vertex without intravenous contrast. COMPARISON:  09/26/2013 FINDINGS: Chronic right periventricular white matter lacunar infarct measuring 5 mm on image 19 series 2, although an interval finding from May, 2015. Otherwise, the brainstem, cerebellum, cerebral peduncles, thalami, basal ganglia, basilar cisterns, and ventricular system appear within normal limits. Periventricular white matter and corona radiata hypodensities favor chronic ischemic microvascular white matter disease. No intracranial hemorrhage, mass lesion, or acute CVA. Chronic ethmoid, and left sphenoid sinusitis observed. Old deformity of the left zygomatic arch. There is atherosclerotic calcification of  the cavernous carotid arteries bilaterally.  IMPRESSION: 1. Chronic microvascular white matter disease. Chronic lacunar infarct in the right periventricular white matter. No acute intracranial findings are apparent. 2. Chronic ethmoid and left sphenoid sinusitis. Electronically Signed   By: Gaylyn Rong M.D.   On: 06/26/2015 19:49   Mr Brain Wo Contrast  06/27/2015  CLINICAL DATA:  81 year old diabetic hypertensive female presents with seizures. Remote seizure from hypoglycemia. Currently being treated for urinary tract infection. Subsequent encounter. EXAM: MRI HEAD WITHOUT CONTRAST MRA HEAD WITHOUT CONTRAST TECHNIQUE: Multiplanar, multiecho pulse sequences of the brain and surrounding structures were obtained without intravenous contrast. Angiographic images of the head were obtained using MRA technique without contrast. COMPARISON:  06/26/2015 head CT 07/10/2005 brain MR. FINDINGS: MRI HEAD FINDINGS Exam is motion degraded. Abnormal signal best seen on diffusion sequence involving medial aspect of the frontal and parietal lobes greater on the left. No surrounding FLAIR abnormality to suggest herpes encephalitis or limbic encephalitis. Findings probably related to seizure activity (and reversible) although similar findings can be seen in setting of episode of significant hypoglycemia Appearance not typical for hypertensive encephalopathy. Suspect very small acute nonhemorrhagic right corona radiata infarct. Remote small posterior right coronal radiata infarct. Marked confluent white matter changes have progressed since prior MR most consistent with result of small vessel disease. Global atrophy without hydrocephalus. No intracranial mass lesion noted on this unenhanced exam. Post lens replacement otherwise orbital structures unremarkable. Opacification ethmoid sinus air cells. Small fluid level within the maxillary sinuses. Cervical medullary junction and pineal region unremarkable. MRA HEAD FINDINGS Exam is motion degraded. Moderate stenosis  junction petrous and cervical segment right internal carotid artery. Mild moderate narrowing right internal carotid artery cavernous segment. Ectatic left internal carotid artery cavernous segment. Ectasia with areas of narrowing involving supraclinoid segment of the internal carotid artery bilaterally. Marked stenosis proximal A1 segment right anterior cerebral artery. A2 segment mild moderate irregularity narrowing bilaterally. Middle cerebral artery moderate branch vessel narrowing and irregularity bilaterally. Fetal type origin right posterior cerebral artery. Mild narrowing distal left vertebral artery. Mild narrowing portions of the posterior inferior cerebellar artery bilaterally. Mild narrowing mid aspect basilar artery. Nonvisualized anterior inferior cerebellar arteries. Moderate narrowing superior cerebellar arteries. Narrowing posterior cerebral artery distal branches. IMPRESSION: MRI HEAD Exam is motion degraded. Abnormal signal best seen on diffusion sequence involving medial aspect of the frontal and parietal lobes greater on the left. No surrounding FLAIR abnormality to suggest herpes encephalitis or limbic encephalitis. Findings probably related to seizure activity (and reversible) although similar findings can be seen in setting of episode of significant hypoglycemia. Suspect very small acute nonhemorrhagic right corona radiata infarct. Remote small posterior right coronal radiata infarct. Marked confluent white matter changes have progressed since prior MR most consistent with result of small vessel disease. Global atrophy without hydrocephalus. Opacification ethmoid sinus air cells. Small fluid level within the maxillary sinuses. Cervical medullary junction and pineal region unremarkable. MRA HEAD Intracranial atherosclerotic type changes as detailed above predominantly involving branch vessels. Electronically Signed   By: Lacy Duverney M.D.   On: 06/27/2015 14:44   US Carotid  Bilateral  06/27/2015  CLINICAL DATA:  Stroke symptoms, hypertension, hyperlipidemia and diabetes. EXAM: BILATERAL CAROTID DUPLEX ULTRASOUND TECHNIQUE: Wallace Cullens scale imaging, color Doppler and duplex ultrasound were performed of bilateral carotid and vertebral arteries in the neck. COMPARISON:  06/27/2015 FINDINGS: Criteria: Quantification of carotid stenosis is based on velocity parameters that correlate the residual internal carotid diameter with NASCET-based stenosis levels, using the diameter of  the distal internal carotid lumen as the denominator for stenosis measurement. The following velocity measurements were obtained: RIGHT ICA:  69/22 cm/sec CCA:  62/15 cm/sec SYSTOLIC ICA/CCA RATIO:  1.1 DIASTOLIC ICA/CCA RATIO:  1.4 ECA:  63 cm/sec LEFT ICA:  90/35 cm/sec CCA:  97/27 cm/sec SYSTOLIC ICA/CCA RATIO:  0.9 DIASTOLIC ICA/CCA RATIO:  1.3 ECA:  115 cm/sec RIGHT CAROTID ARTERY: Minor atherosclerotic plaque formation. No hemodynamically significant right ICA stenosis, velocity elevation, or turbulent flow. Degree of narrowing less than 50%. RIGHT VERTEBRAL ARTERY:  Antegrade LEFT CAROTID ARTERY: Similar scattered minor atherosclerotic plaque formation. No hemodynamically significant left ICA stenosis, velocity elevation, or turbulent flow. LEFT VERTEBRAL ARTERY:  Antegrade IMPRESSION: Minor carotid atherosclerosis. No hemodynamically significant ICA stenosis. Degree of narrowing less than 50% bilaterally. Patent antegrade vertebral flow bilaterally Electronically Signed   By: Judie Petit.  Shick M.D.   On: 06/27/2015 15:10   Dg Chest Portable 1 View  06/26/2015  CLINICAL DATA:  Seizure. EXAM: PORTABLE CHEST 1 VIEW COMPARISON:  Chest radiograph 01/16/2015, abdominal CT 05/16/2015 FINDINGS: The cardiomediastinal contours are unchanged, mild cardiomegaly. Mild vascular congestion without alveolar edema. No consolidation, pleural effusion, or pneumothorax. No acute osseous abnormalities are seen. Air in the left upper quadrant  within the stomach and out landing stool within the colon, similar in appearance to prior abdominal CT 05/16/2015. IMPRESSION: Stable mild cardiomegaly.  Vascular congestion, new. Electronically Signed   By: Rubye Oaks M.D.   On: 06/26/2015 19:55   Mr Palma Holter  06/27/2015  CLINICAL DATA:  81 year old diabetic hypertensive female presents with seizures. Remote seizure from hypoglycemia. Currently being treated for urinary tract infection. Subsequent encounter. EXAM: MRI HEAD WITHOUT CONTRAST MRA HEAD WITHOUT CONTRAST TECHNIQUE: Multiplanar, multiecho pulse sequences of the brain and surrounding structures were obtained without intravenous contrast. Angiographic images of the head were obtained using MRA technique without contrast. COMPARISON:  06/26/2015 head CT 07/10/2005 brain MR. FINDINGS: MRI HEAD FINDINGS Exam is motion degraded. Abnormal signal best seen on diffusion sequence involving medial aspect of the frontal and parietal lobes greater on the left. No surrounding FLAIR abnormality to suggest herpes encephalitis or limbic encephalitis. Findings probably related to seizure activity (and reversible) although similar findings can be seen in setting of episode of significant hypoglycemia Appearance not typical for hypertensive encephalopathy. Suspect very small acute nonhemorrhagic right corona radiata infarct. Remote small posterior right coronal radiata infarct. Marked confluent white matter changes have progressed since prior MR most consistent with result of small vessel disease. Global atrophy without hydrocephalus. No intracranial mass lesion noted on this unenhanced exam. Post lens replacement otherwise orbital structures unremarkable. Opacification ethmoid sinus air cells. Small fluid level within the maxillary sinuses. Cervical medullary junction and pineal region unremarkable. MRA HEAD FINDINGS Exam is motion degraded. Moderate stenosis junction petrous and cervical segment right internal  carotid artery. Mild moderate narrowing right internal carotid artery cavernous segment. Ectatic left internal carotid artery cavernous segment. Ectasia with areas of narrowing involving supraclinoid segment of the internal carotid artery bilaterally. Marked stenosis proximal A1 segment right anterior cerebral artery. A2 segment mild moderate irregularity narrowing bilaterally. Middle cerebral artery moderate branch vessel narrowing and irregularity bilaterally. Fetal type origin right posterior cerebral artery. Mild narrowing distal left vertebral artery. Mild narrowing portions of the posterior inferior cerebellar artery bilaterally. Mild narrowing mid aspect basilar artery. Nonvisualized anterior inferior cerebellar arteries. Moderate narrowing superior cerebellar arteries. Narrowing posterior cerebral artery distal branches. IMPRESSION: MRI HEAD Exam is motion degraded. Abnormal signal best seen on diffusion  sequence involving medial aspect of the frontal and parietal lobes greater on the left. No surrounding FLAIR abnormality to suggest herpes encephalitis or limbic encephalitis. Findings probably related to seizure activity (and reversible) although similar findings can be seen in setting of episode of significant hypoglycemia. Suspect very small acute nonhemorrhagic right corona radiata infarct. Remote small posterior right coronal radiata infarct. Marked confluent white matter changes have progressed since prior MR most consistent with result of small vessel disease. Global atrophy without hydrocephalus. Opacification ethmoid sinus air cells. Small fluid level within the maxillary sinuses. Cervical medullary junction and pineal region unremarkable. MRA HEAD Intracranial atherosclerotic type changes as detailed above predominantly involving branch vessels. Electronically Signed   By: Lacy Duverney M.D.   On: 06/27/2015 14:44     ASSESSMENT AND PLAN:   # Seizures - Likely from CVA. MRI showed acute  infarct Right corona radiata EEG showed epileptiform waves. On Vimpat. Due to recurrent seizures i ordered a STAT EEG. Discussed with Dr. Thad Ranger and increased Vimpat dose as her renal function is improving.  # Acute encephalopathy due to CVA and post-ictal state. Patient also received Ativan earlier today for her seizure.  # UTI (lower urinary tract infection), recent Presently pt is afebrile. Ucx are negative Invanz can lower seizure threshold. We'll stop antibiotics.  # ARF over CKD 4 - avoid nephrotoxins, monitor very closely. Patient is not on dialysis. Started IVF Slowly improving  # DM (diabetes mellitus), type 2, uncontrolled  SSI Pt is NPO at this time  # HTN (hypertension) - monitor, when necessary antihypertensives  # Hypothyroidism - Levothyroxine  # DVT prophylaxis Heparin SQ  All the records are reviewed and case discussed with Care Management/Social Workerr. Management plans discussed with the patient, family and they are in agreement.  CODE STATUS: FULL  DVT Prophylaxis: SCDs  TOTAL CC TIME TAKING CARE OF THIS PATIENT: 35 minutes.   POSSIBLE D/C IN 2-3 DAYS, DEPENDING ON CLINICAL CONDITION.   Milagros Loll R M.D on 06/28/2015 at 10:50 AM  Between 7am to 6pm - Pager - [phone]  After 6pm go to [email or web address] - password [email or web address] [facility]  [facility]  Office  [phone]  CC: Primary care physician; Nelda Bucks., MD    Note: This dictation was prepared with Dragon dictation along with smaller phrase technology. Any transcriptional errors that result from this process are unintentional.

## 2015-06-28 NOTE — Progress Notes (Addendum)
Subjective: Patient unresponsive.  Has had two seizures overnight.    Objective: Current vital signs: BP 136/101 mmHg  Pulse 81  Temp(Src) 98.3 F (36.8 C) (Oral)  Resp 16  Ht 5\' 1"  (1.549 m)  Wt 52.98 kg (116 lb 12.8 oz)  BMI 22.08 kg/m2  SpO2 98% Vital signs in last 24 hours: Temp:  [98.1 F (36.7 C)-98.5 F (36.9 C)] 98.3 F (36.8 C) (02/03 1151) Pulse Rate:  [70-84] 81 (02/03 1151) Resp:  [16-20] 16 (02/03 0511) BP: (136-156)/(58-101) 136/101 mmHg (02/03 1151) SpO2:  [92 %-100 %] 98 % (02/03 1151)  Intake/Output from previous day: 02/02 0701 - 02/03 0700 In: 1132.5 [I.V.:1082.5; IV Piggyback:50] Out: 0  Intake/Output this shift:   Nutritional status: Diet NPO time specified  Neurologic Exam: Mental Status: Obtunded. Nonverbal. Does not follow commands.Grimaces with deep sternal rub.   Cranial Nerves: II: Discs unable to be visualized secondary to cooperation. Pupils equal, round, reactive to light and accommodation III,IV, VI: Intact oculocephalic maneuvers V,VII: left facial droop VIII: unable to test IX,X: unable to test XI: unable to test XII: unable to test Motor: Moves left with noxious stimuli.  No movement on right noted.   Sensory: Grimaces to noxious stimuli throughout. Moves left to pain as well even when applied to right side   Lab Results: Basic Metabolic Panel:  Recent Labs Lab 06/24/15 1200 06/26/15 1917 06/27/15 0556 06/28/15 0552  NA 137 142 144 141  K 3.9 4.5 4.3 4.1  CL 110 117* 121* 117*  CO2 22 17* 20* 18*  GLUCOSE 258* 168* 112* 176*  BUN 65* 62* 58* 60*  CREATININE 3.27* 3.44* 2.99* 2.75*  CALCIUM 9.2 8.6* 8.9 8.8*    Liver Function Tests: No results for input(s): AST, ALT, ALKPHOS, BILITOT, PROT, ALBUMIN in the last 168 hours. No results for input(s): LIPASE, AMYLASE in the last 168 hours. No results for input(s): AMMONIA in the last 168 hours.  CBC:  Recent Labs Lab 06/24/15 1200 06/26/15 1917  06/27/15 0556 06/28/15 0552  WBC 8.3 4.5 5.2 6.2  NEUTROABS 6.5  --   --   --   HGB 10.2* 9.0* 9.5* 9.1*  HCT 30.4* 27.6* 28.5* 27.3*  MCV 87.2 89.5 88.7 88.5  PLT 112* 100* 100* 106*    Cardiac Enzymes:  Recent Labs Lab 06/27/15 0120 06/27/15 0556 06/27/15 1151 06/27/15 1449  CKTOTAL  --   --   --  66  TROPONINI 0.05* 0.05* 0.06*  --     Lipid Panel:  Recent Labs Lab 06/27/15 0556  CHOL 114  TRIG 121  HDL 32*  CHOLHDL 3.6  VLDL 24  LDLCALC 58    CBG:  Recent Labs Lab 06/27/15 2031 06/27/15 2358 06/28/15 0427 06/28/15 0749 06/28/15 1110  GLUCAP 156* 205* 176* 187* 207*    Microbiology: Results for orders placed or performed during the hospital encounter of 06/26/15  Urine culture     Status: None   Collection Time: 06/26/15  7:17 PM  Result Value Ref Range Status   Specimen Description URINE, RANDOM  Final   Special Requests NONE  Final   Culture MULTIPLE SPECIES PRESENT, SUGGEST RECOLLECTION  Final   Report Status 06/28/2015 FINAL  Final  Blood culture (routine x 2)     Status: None (Preliminary result)   Collection Time: 06/26/15  9:09 PM  Result Value Ref Range Status   Specimen Description BLOOD RIGHT ANTECUBITAL  Final   Special Requests BOTTLES DRAWN AEROBIC AND ANAEROBIC  Final   Culture NO GROWTH 2 DAYS  Final   Report Status PENDING  Incomplete  Blood culture (routine x 2)     Status: None (Preliminary result)   Collection Time: 06/26/15  9:09 PM  Result Value Ref Range Status   Specimen Description BLOOD LEFT HAND  Final   Special Requests BOTTLES DRAWN AEROBIC AND ANAEROBIC  Final   Culture NO GROWTH 2 DAYS  Final   Report Status PENDING  Incomplete  MRSA PCR Screening     Status: Abnormal   Collection Time: 06/27/15 12:40 AM  Result Value Ref Range Status   MRSA by PCR POSITIVE (A) NEGATIVE Final    Comment: CRITICAL RESULT CALLED TO, READ BACK BY AND VERIFIED WITH: ADRIAN WHITE ON 06/27/15 AT 0242 [facility]        The  GeneXpert MRSA Assay (FDA approved for NASAL specimens only), is one component of a comprehensive MRSA colonization surveillance program. It is not intended to diagnose MRSA infection nor to guide or monitor treatment for MRSA infections.     Coagulation Studies: No results for input(s): LABPROT, INR in the last 72 hours.  Imaging: Ct Head Wo Contrast  06/26/2015  CLINICAL DATA:  Witnessed seizure during dinner. CVA symptoms over the past several days. EXAM: CT HEAD WITHOUT CONTRAST TECHNIQUE: Contiguous axial images were obtained from the base of the skull through the vertex without intravenous contrast. COMPARISON:  09/26/2013 FINDINGS: Chronic right periventricular white matter lacunar infarct measuring 5 mm on image 19 series 2, although an interval finding from May, 2015. Otherwise, the brainstem, cerebellum, cerebral peduncles, thalami, basal ganglia, basilar cisterns, and ventricular system appear within normal limits. Periventricular white matter and corona radiata hypodensities favor chronic ischemic microvascular white matter disease. No intracranial hemorrhage, mass lesion, or acute CVA. Chronic ethmoid, and left sphenoid sinusitis observed. Old deformity of the left zygomatic arch. There is atherosclerotic calcification of the cavernous carotid arteries bilaterally. IMPRESSION: 1. Chronic microvascular white matter disease. Chronic lacunar infarct in the right periventricular white matter. No acute intracranial findings are apparent. 2. Chronic ethmoid and left sphenoid sinusitis. Electronically Signed   By: Gaylyn Rong M.D.   On: 06/26/2015 19:49   Mr Brain Wo Contrast  06/27/2015  CLINICAL DATA:  81 year old diabetic hypertensive female presents with seizures. Remote seizure from hypoglycemia. Currently being treated for urinary tract infection. Subsequent encounter. EXAM: MRI HEAD WITHOUT CONTRAST MRA HEAD WITHOUT CONTRAST TECHNIQUE: Multiplanar, multiecho pulse sequences of  the brain and surrounding structures were obtained without intravenous contrast. Angiographic images of the head were obtained using MRA technique without contrast. COMPARISON:  06/26/2015 head CT 07/10/2005 brain MR. FINDINGS: MRI HEAD FINDINGS Exam is motion degraded. Abnormal signal best seen on diffusion sequence involving medial aspect of the frontal and parietal lobes greater on the left. No surrounding FLAIR abnormality to suggest herpes encephalitis or limbic encephalitis. Findings probably related to seizure activity (and reversible) although similar findings can be seen in setting of episode of significant hypoglycemia Appearance not typical for hypertensive encephalopathy. Suspect very small acute nonhemorrhagic right corona radiata infarct. Remote small posterior right coronal radiata infarct. Marked confluent white matter changes have progressed since prior MR most consistent with result of small vessel disease. Global atrophy without hydrocephalus. No intracranial mass lesion noted on this unenhanced exam. Post lens replacement otherwise orbital structures unremarkable. Opacification ethmoid sinus air cells. Small fluid level within the maxillary sinuses. Cervical medullary junction and pineal region unremarkable. MRA HEAD FINDINGS Exam is motion degraded.  Moderate stenosis junction petrous and cervical segment right internal carotid artery. Mild moderate narrowing right internal carotid artery cavernous segment. Ectatic left internal carotid artery cavernous segment. Ectasia with areas of narrowing involving supraclinoid segment of the internal carotid artery bilaterally. Marked stenosis proximal A1 segment right anterior cerebral artery. A2 segment mild moderate irregularity narrowing bilaterally. Middle cerebral artery moderate branch vessel narrowing and irregularity bilaterally. Fetal type origin right posterior cerebral artery. Mild narrowing distal left vertebral artery. Mild narrowing portions  of the posterior inferior cerebellar artery bilaterally. Mild narrowing mid aspect basilar artery. Nonvisualized anterior inferior cerebellar arteries. Moderate narrowing superior cerebellar arteries. Narrowing posterior cerebral artery distal branches. IMPRESSION: MRI HEAD Exam is motion degraded. Abnormal signal best seen on diffusion sequence involving medial aspect of the frontal and parietal lobes greater on the left. No surrounding FLAIR abnormality to suggest herpes encephalitis or limbic encephalitis. Findings probably related to seizure activity (and reversible) although similar findings can be seen in setting of episode of significant hypoglycemia. Suspect very small acute nonhemorrhagic right corona radiata infarct. Remote small posterior right coronal radiata infarct. Marked confluent white matter changes have progressed since prior MR most consistent with result of small vessel disease. Global atrophy without hydrocephalus. Opacification ethmoid sinus air cells. Small fluid level within the maxillary sinuses. Cervical medullary junction and pineal region unremarkable. MRA HEAD Intracranial atherosclerotic type changes as detailed above predominantly involving branch vessels. Electronically Signed   By: Lacy Duverney M.D.   On: 06/27/2015 14:44   US Carotid Bilateral  06/27/2015  CLINICAL DATA:  Stroke symptoms, hypertension, hyperlipidemia and diabetes. EXAM: BILATERAL CAROTID DUPLEX ULTRASOUND TECHNIQUE: Wallace Cullens scale imaging, color Doppler and duplex ultrasound were performed of bilateral carotid and vertebral arteries in the neck. COMPARISON:  06/27/2015 FINDINGS: Criteria: Quantification of carotid stenosis is based on velocity parameters that correlate the residual internal carotid diameter with NASCET-based stenosis levels, using the diameter of the distal internal carotid lumen as the denominator for stenosis measurement. The following velocity measurements were obtained: RIGHT ICA:  69/22 cm/sec  CCA:  62/15 cm/sec SYSTOLIC ICA/CCA RATIO:  1.1 DIASTOLIC ICA/CCA RATIO:  1.4 ECA:  63 cm/sec LEFT ICA:  90/35 cm/sec CCA:  97/27 cm/sec SYSTOLIC ICA/CCA RATIO:  0.9 DIASTOLIC ICA/CCA RATIO:  1.3 ECA:  115 cm/sec RIGHT CAROTID ARTERY: Minor atherosclerotic plaque formation. No hemodynamically significant right ICA stenosis, velocity elevation, or turbulent flow. Degree of narrowing less than 50%. RIGHT VERTEBRAL ARTERY:  Antegrade LEFT CAROTID ARTERY: Similar scattered minor atherosclerotic plaque formation. No hemodynamically significant left ICA stenosis, velocity elevation, or turbulent flow. LEFT VERTEBRAL ARTERY:  Antegrade IMPRESSION: Minor carotid atherosclerosis. No hemodynamically significant ICA stenosis. Degree of narrowing less than 50% bilaterally. Patent antegrade vertebral flow bilaterally Electronically Signed   By: Judie Petit.  Shick M.D.   On: 06/27/2015 15:10   Dg Chest Portable 1 View  06/26/2015  CLINICAL DATA:  Seizure. EXAM: PORTABLE CHEST 1 VIEW COMPARISON:  Chest radiograph 01/16/2015, abdominal CT 05/16/2015 FINDINGS: The cardiomediastinal contours are unchanged, mild cardiomegaly. Mild vascular congestion without alveolar edema. No consolidation, pleural effusion, or pneumothorax. No acute osseous abnormalities are seen. Air in the left upper quadrant within the stomach and out landing stool within the colon, similar in appearance to prior abdominal CT 05/16/2015. IMPRESSION: Stable mild cardiomegaly.  Vascular congestion, new. Electronically Signed   By: Rubye Oaks M.D.   On: 06/26/2015 19:55   Mr Palma Holter  06/27/2015  CLINICAL DATA:  81 year old diabetic hypertensive female presents with seizures. Remote seizure from  hypoglycemia. Currently being treated for urinary tract infection. Subsequent encounter. EXAM: MRI HEAD WITHOUT CONTRAST MRA HEAD WITHOUT CONTRAST TECHNIQUE: Multiplanar, multiecho pulse sequences of the brain and surrounding structures were obtained without intravenous  contrast. Angiographic images of the head were obtained using MRA technique without contrast. COMPARISON:  06/26/2015 head CT 07/10/2005 brain MR. FINDINGS: MRI HEAD FINDINGS Exam is motion degraded. Abnormal signal best seen on diffusion sequence involving medial aspect of the frontal and parietal lobes greater on the left. No surrounding FLAIR abnormality to suggest herpes encephalitis or limbic encephalitis. Findings probably related to seizure activity (and reversible) although similar findings can be seen in setting of episode of significant hypoglycemia Appearance not typical for hypertensive encephalopathy. Suspect very small acute nonhemorrhagic right corona radiata infarct. Remote small posterior right coronal radiata infarct. Marked confluent white matter changes have progressed since prior MR most consistent with result of small vessel disease. Global atrophy without hydrocephalus. No intracranial mass lesion noted on this unenhanced exam. Post lens replacement otherwise orbital structures unremarkable. Opacification ethmoid sinus air cells. Small fluid level within the maxillary sinuses. Cervical medullary junction and pineal region unremarkable. MRA HEAD FINDINGS Exam is motion degraded. Moderate stenosis junction petrous and cervical segment right internal carotid artery. Mild moderate narrowing right internal carotid artery cavernous segment. Ectatic left internal carotid artery cavernous segment. Ectasia with areas of narrowing involving supraclinoid segment of the internal carotid artery bilaterally. Marked stenosis proximal A1 segment right anterior cerebral artery. A2 segment mild moderate irregularity narrowing bilaterally. Middle cerebral artery moderate branch vessel narrowing and irregularity bilaterally. Fetal type origin right posterior cerebral artery. Mild narrowing distal left vertebral artery. Mild narrowing portions of the posterior inferior cerebellar artery bilaterally. Mild narrowing  mid aspect basilar artery. Nonvisualized anterior inferior cerebellar arteries. Moderate narrowing superior cerebellar arteries. Narrowing posterior cerebral artery distal branches. IMPRESSION: MRI HEAD Exam is motion degraded. Abnormal signal best seen on diffusion sequence involving medial aspect of the frontal and parietal lobes greater on the left. No surrounding FLAIR abnormality to suggest herpes encephalitis or limbic encephalitis. Findings probably related to seizure activity (and reversible) although similar findings can be seen in setting of episode of significant hypoglycemia. Suspect very small acute nonhemorrhagic right corona radiata infarct. Remote small posterior right coronal radiata infarct. Marked confluent white matter changes have progressed since prior MR most consistent with result of small vessel disease. Global atrophy without hydrocephalus. Opacification ethmoid sinus air cells. Small fluid level within the maxillary sinuses. Cervical medullary junction and pineal region unremarkable. MRA HEAD Intracranial atherosclerotic type changes as detailed above predominantly involving branch vessels. Electronically Signed   By: Lacy Duverney M.D.   On: 06/27/2015 14:44    Medications:  I have reviewed the patient's current medications. Scheduled: . antiseptic oral rinse  7 mL Mouth Rinse BID  . Chlorhexidine Gluconate Cloth  6 each Topical Q0600  . heparin  5,000 Units Subcutaneous 3 times per day  . insulin aspart  0-9 Units Subcutaneous 6 times per day  . lacosamide (VIMPAT) IV  100 mg Intravenous Q12H  . mupirocin ointment  1 application Nasal BID  . sodium chloride flush  3 mL Intravenous Q12H    Assessment/Plan: Patient with 2 further seizures overnight.  Had not returned to baseline from presenting seizures.  Ativan given and Vimpat increased.  Depressed mental status likely related to medications (Ativan) and post-ictal effects.  EEG improved.   MRI of the brain personally  reviewed and shows a questionable acute small infarct in  the right corona radiata and abnormal signal in the left frontal and parietal lobes consistent with seizure activity.  A1c elevated.  LDL 58. Doppler shows no hemodynamically significant stenosis.    Recommendations: 1.  Agree with increase in Vimpat.  With improvement in renal function, higher dose likely necessary.  If further seizures would further increase to 150mg  BID 2.  Agree with change in antibiotics 3.  Continue seizure precautions 4.  Echocardiogram 5.  ASA 300mg  rectally QD    LOS: 2 days   Thana Farr, MD Neurology [phone] 06/28/2015  1:15 PM

## 2015-06-28 NOTE — Progress Notes (Signed)
Spoke with patient's daughter and updated on condition. Provided additional stroke and seizure education at this time. Trudee Kuster

## 2015-06-28 NOTE — Progress Notes (Signed)
OT Cancellation Note  Patient Details Name: Gina Andersen MRN: [id number] DOB: 25-Nov-1934   Cancelled Treatment:    Reason Eval/Treat Not Completed: Patient's level of consciousness. Chart review shows patient level of consciousness is not appropriate for therapy. Will re-try time permitting if this changes.  Ocie Cornfield 06/28/2015, 12:05 PM

## 2015-06-29 ENCOUNTER — Inpatient Hospital Stay (HOSPITAL_COMMUNITY): Payer: Medicare Other

## 2015-06-29 ENCOUNTER — Inpatient Hospital Stay (HOSPITAL_COMMUNITY)
Admission: AD | Admit: 2015-06-29 | Discharge: 2015-07-24 | DRG: 100 | Disposition: E | Payer: Medicare Other | Source: Other Acute Inpatient Hospital | Attending: Pulmonary Disease | Admitting: Pulmonary Disease

## 2015-06-29 DIAGNOSIS — Z4659 Encounter for fitting and adjustment of other gastrointestinal appliance and device: Secondary | ICD-10-CM

## 2015-06-29 DIAGNOSIS — Z993 Dependence on wheelchair: Secondary | ICD-10-CM

## 2015-06-29 DIAGNOSIS — I639 Cerebral infarction, unspecified: Secondary | ICD-10-CM | POA: Diagnosis present

## 2015-06-29 DIAGNOSIS — I952 Hypotension due to drugs: Secondary | ICD-10-CM | POA: Diagnosis present

## 2015-06-29 DIAGNOSIS — R1310 Dysphagia, unspecified: Secondary | ICD-10-CM | POA: Diagnosis present

## 2015-06-29 DIAGNOSIS — G9340 Encephalopathy, unspecified: Secondary | ICD-10-CM | POA: Diagnosis not present

## 2015-06-29 DIAGNOSIS — I6320 Cerebral infarction due to unspecified occlusion or stenosis of unspecified precerebral arteries: Secondary | ICD-10-CM | POA: Diagnosis not present

## 2015-06-29 DIAGNOSIS — F419 Anxiety disorder, unspecified: Secondary | ICD-10-CM | POA: Diagnosis present

## 2015-06-29 DIAGNOSIS — E785 Hyperlipidemia, unspecified: Secondary | ICD-10-CM | POA: Diagnosis present

## 2015-06-29 DIAGNOSIS — Z66 Do not resuscitate: Secondary | ICD-10-CM | POA: Diagnosis not present

## 2015-06-29 DIAGNOSIS — D6489 Other specified anemias: Secondary | ICD-10-CM | POA: Diagnosis present

## 2015-06-29 DIAGNOSIS — N390 Urinary tract infection, site not specified: Secondary | ICD-10-CM | POA: Diagnosis present

## 2015-06-29 DIAGNOSIS — N185 Chronic kidney disease, stage 5: Secondary | ICD-10-CM | POA: Diagnosis not present

## 2015-06-29 DIAGNOSIS — R569 Unspecified convulsions: Secondary | ICD-10-CM | POA: Diagnosis not present

## 2015-06-29 DIAGNOSIS — Z8249 Family history of ischemic heart disease and other diseases of the circulatory system: Secondary | ICD-10-CM

## 2015-06-29 DIAGNOSIS — Z841 Family history of disorders of kidney and ureter: Secondary | ICD-10-CM

## 2015-06-29 DIAGNOSIS — E039 Hypothyroidism, unspecified: Secondary | ICD-10-CM | POA: Diagnosis present

## 2015-06-29 DIAGNOSIS — E038 Other specified hypothyroidism: Secondary | ICD-10-CM | POA: Diagnosis not present

## 2015-06-29 DIAGNOSIS — K219 Gastro-esophageal reflux disease without esophagitis: Secondary | ICD-10-CM | POA: Diagnosis present

## 2015-06-29 DIAGNOSIS — N179 Acute kidney failure, unspecified: Secondary | ICD-10-CM

## 2015-06-29 DIAGNOSIS — E1165 Type 2 diabetes mellitus with hyperglycemia: Secondary | ICD-10-CM | POA: Diagnosis present

## 2015-06-29 DIAGNOSIS — R4020 Unspecified coma: Secondary | ICD-10-CM | POA: Diagnosis not present

## 2015-06-29 DIAGNOSIS — Z9289 Personal history of other medical treatment: Secondary | ICD-10-CM | POA: Insufficient documentation

## 2015-06-29 DIAGNOSIS — H9190 Unspecified hearing loss, unspecified ear: Secondary | ICD-10-CM | POA: Diagnosis present

## 2015-06-29 DIAGNOSIS — I120 Hypertensive chronic kidney disease with stage 5 chronic kidney disease or end stage renal disease: Secondary | ICD-10-CM | POA: Diagnosis present

## 2015-06-29 DIAGNOSIS — Z79899 Other long term (current) drug therapy: Secondary | ICD-10-CM

## 2015-06-29 DIAGNOSIS — Z833 Family history of diabetes mellitus: Secondary | ICD-10-CM | POA: Diagnosis not present

## 2015-06-29 DIAGNOSIS — E872 Acidosis: Secondary | ICD-10-CM | POA: Diagnosis present

## 2015-06-29 DIAGNOSIS — Z823 Family history of stroke: Secondary | ICD-10-CM | POA: Diagnosis not present

## 2015-06-29 DIAGNOSIS — I959 Hypotension, unspecified: Secondary | ICD-10-CM | POA: Insufficient documentation

## 2015-06-29 DIAGNOSIS — F329 Major depressive disorder, single episode, unspecified: Secondary | ICD-10-CM | POA: Diagnosis present

## 2015-06-29 DIAGNOSIS — E878 Other disorders of electrolyte and fluid balance, not elsewhere classified: Secondary | ICD-10-CM | POA: Diagnosis present

## 2015-06-29 DIAGNOSIS — I9589 Other hypotension: Secondary | ICD-10-CM | POA: Diagnosis not present

## 2015-06-29 DIAGNOSIS — Z7982 Long term (current) use of aspirin: Secondary | ICD-10-CM | POA: Diagnosis not present

## 2015-06-29 DIAGNOSIS — Z515 Encounter for palliative care: Secondary | ICD-10-CM | POA: Diagnosis not present

## 2015-06-29 DIAGNOSIS — G40901 Epilepsy, unspecified, not intractable, with status epilepticus: Principal | ICD-10-CM | POA: Diagnosis present

## 2015-06-29 DIAGNOSIS — J9601 Acute respiratory failure with hypoxia: Secondary | ICD-10-CM | POA: Insufficient documentation

## 2015-06-29 DIAGNOSIS — D649 Anemia, unspecified: Secondary | ICD-10-CM | POA: Diagnosis present

## 2015-06-29 DIAGNOSIS — IMO0002 Reserved for concepts with insufficient information to code with codable children: Secondary | ICD-10-CM | POA: Diagnosis present

## 2015-06-29 DIAGNOSIS — I10 Essential (primary) hypertension: Secondary | ICD-10-CM | POA: Diagnosis present

## 2015-06-29 LAB — HEPATIC FUNCTION PANEL
ALBUMIN: 2.3 g/dL — AB (ref 3.5–5.0)
ALK PHOS: 75 U/L (ref 38–126)
ALK PHOS: 83 U/L (ref 38–126)
ALT: 14 U/L (ref 14–54)
ALT: 15 U/L (ref 14–54)
AST: 25 U/L (ref 15–41)
AST: 30 U/L (ref 15–41)
Albumin: 2.3 g/dL — ABNORMAL LOW (ref 3.5–5.0)
BILIRUBIN DIRECT: 0.1 mg/dL (ref 0.1–0.5)
BILIRUBIN INDIRECT: 0.8 mg/dL (ref 0.3–0.9)
BILIRUBIN TOTAL: 0.4 mg/dL (ref 0.3–1.2)
BILIRUBIN TOTAL: 1 mg/dL (ref 0.3–1.2)
Bilirubin, Direct: 0.2 mg/dL (ref 0.1–0.5)
Indirect Bilirubin: 0.3 mg/dL (ref 0.3–0.9)
Total Protein: 5.9 g/dL — ABNORMAL LOW (ref 6.5–8.1)
Total Protein: 6.2 g/dL — ABNORMAL LOW (ref 6.5–8.1)

## 2015-06-29 LAB — GLUCOSE, CAPILLARY
GLUCOSE-CAPILLARY: 103 mg/dL — AB (ref 65–99)
GLUCOSE-CAPILLARY: 105 mg/dL — AB (ref 65–99)
GLUCOSE-CAPILLARY: 115 mg/dL — AB (ref 65–99)
GLUCOSE-CAPILLARY: 136 mg/dL — AB (ref 65–99)
GLUCOSE-CAPILLARY: 185 mg/dL — AB (ref 65–99)
Glucose-Capillary: 135 mg/dL — ABNORMAL HIGH (ref 65–99)
Glucose-Capillary: 98 mg/dL (ref 65–99)

## 2015-06-29 LAB — POCT I-STAT 3, ART BLOOD GAS (G3+)
ACID-BASE DEFICIT: 12 mmol/L — AB (ref 0.0–2.0)
Bicarbonate: 13.1 mEq/L — ABNORMAL LOW (ref 20.0–24.0)
O2 SAT: 99 %
TCO2: 14 mmol/L (ref 0–100)
pCO2 arterial: 22.8 mmHg — ABNORMAL LOW (ref 35.0–45.0)
pH, Arterial: 7.353 (ref 7.350–7.450)
pO2, Arterial: 135 mmHg — ABNORMAL HIGH (ref 80.0–100.0)

## 2015-06-29 LAB — TSH: TSH: 17.718 u[IU]/mL — ABNORMAL HIGH (ref 0.350–4.500)

## 2015-06-29 LAB — BASIC METABOLIC PANEL
Anion gap: 9 (ref 5–15)
BUN: 57 mg/dL — ABNORMAL HIGH (ref 6–20)
CHLORIDE: 116 mmol/L — AB (ref 101–111)
CO2: 17 mmol/L — AB (ref 22–32)
CREATININE: 2.62 mg/dL — AB (ref 0.44–1.00)
Calcium: 8.9 mg/dL (ref 8.9–10.3)
GFR calc Af Amer: 19 mL/min — ABNORMAL LOW (ref >60)
GFR calc non Af Amer: 16 mL/min — ABNORMAL LOW (ref >60)
Glucose, Bld: 148 mg/dL — ABNORMAL HIGH (ref 65–99)
POTASSIUM: 4.6 mmol/L (ref 3.5–5.1)
Sodium: 142 mmol/L (ref 135–145)

## 2015-06-29 LAB — TRIGLYCERIDES: TRIGLYCERIDES: 236 mg/dL — AB (ref <150)

## 2015-06-29 LAB — PHENYTOIN LEVEL, TOTAL: PHENYTOIN LVL: 7.9 ug/mL — AB (ref 10.0–20.0)

## 2015-06-29 MED ORDER — SODIUM CHLORIDE 0.9 % IV SOLN: 200.0000 mg | Freq: Two times a day (BID) | INTRAVENOUS | Status: DC

## 2015-06-29 MED ORDER — FENTANYL CITRATE (PF) 100 MCG/2ML IJ SOLN
100.0000 ug | Freq: Once | INTRAMUSCULAR | Status: AC
  Administered 2015-06-29: 100 ug via INTRAVENOUS

## 2015-06-29 MED ORDER — LEVOTHYROXINE SODIUM 100 MCG IV SOLR
75.0000 ug | Freq: Every day | INTRAVENOUS | Status: DC
  Administered 2015-06-29: 75 ug via INTRAVENOUS
  Filled 2015-06-29 (×2): qty 5

## 2015-06-29 MED ORDER — MUPIROCIN 2 % EX OINT
1.0000 "application " | TOPICAL_OINTMENT | Freq: Two times a day (BID) | CUTANEOUS | Status: DC
  Administered 2015-06-29 – 2015-07-10 (×22): 1 via NASAL
  Filled 2015-06-29 (×2): qty 22

## 2015-06-29 MED ORDER — INSULIN ASPART 100 UNIT/ML ~~LOC~~ SOLN: 0.0000 [IU] | Freq: Three times a day (TID) | SUBCUTANEOUS | Status: DC

## 2015-06-29 MED ORDER — ACETAMINOPHEN 650 MG RE SUPP: 650.0000 mg | Freq: Four times a day (QID) | RECTAL | Status: DC | PRN

## 2015-06-29 MED ORDER — SODIUM CHLORIDE 0.9 % IV SOLN
INTRAVENOUS | Status: DC
  Administered 2015-06-29 – 2015-07-02 (×3): via INTRAVENOUS

## 2015-06-29 MED ORDER — PROPOFOL 1000 MG/100ML IV EMUL
5.0000 ug/kg/min | INTRAVENOUS | Status: DC
  Administered 2015-06-29 (×2): 80 ug/kg/min via INTRAVENOUS
  Administered 2015-06-29 – 2015-06-30 (×4): 90 ug/kg/min via INTRAVENOUS
  Administered 2015-06-30 (×2): 89.791 ug/kg/min via INTRAVENOUS
  Administered 2015-06-30 – 2015-07-01 (×4): 90 ug/kg/min via INTRAVENOUS
  Filled 2015-06-29 (×10): qty 100

## 2015-06-29 MED ORDER — PHENYTOIN SODIUM 50 MG/ML IJ SOLN
1000.0000 mg | Freq: Once | INTRAMUSCULAR | Status: AC
  Administered 2015-06-29: 1000 mg via INTRAVENOUS
  Filled 2015-06-29: qty 20

## 2015-06-29 MED ORDER — MIDAZOLAM HCL 2 MG/2ML IJ SOLN
INTRAMUSCULAR | Status: AC
  Administered 2015-06-29: 2 mg
  Filled 2015-06-29: qty 2

## 2015-06-29 MED ORDER — ANTISEPTIC ORAL RINSE SOLUTION (CORINZ)
7.0000 mL | OROMUCOSAL | Status: DC
  Administered 2015-06-30 – 2015-07-04 (×46): 7 mL via OROMUCOSAL

## 2015-06-29 MED ORDER — PHENYTOIN SODIUM 50 MG/ML IJ SOLN
100.0000 mg | Freq: Three times a day (TID) | INTRAMUSCULAR | Status: DC
  Administered 2015-06-29 – 2015-07-03 (×11): 100 mg via INTRAVENOUS
  Filled 2015-06-29 (×11): qty 2

## 2015-06-29 MED ORDER — SODIUM CHLORIDE 0.9 % IV SOLN
200.0000 mg | Freq: Two times a day (BID) | INTRAVENOUS | Status: DC
  Administered 2015-06-29 – 2015-07-10 (×22): 200 mg via INTRAVENOUS
  Filled 2015-06-29 (×45): qty 20

## 2015-06-29 MED ORDER — INSULIN ASPART 100 UNIT/ML ~~LOC~~ SOLN
0.0000 [IU] | SUBCUTANEOUS | Status: DC
  Administered 2015-06-30: 5 [IU] via SUBCUTANEOUS
  Administered 2015-06-30 (×3): 3 [IU] via SUBCUTANEOUS
  Administered 2015-06-30 (×2): 2 [IU] via SUBCUTANEOUS
  Administered 2015-07-01: 3 [IU] via SUBCUTANEOUS
  Administered 2015-07-01: 8 [IU] via SUBCUTANEOUS
  Administered 2015-07-01 (×2): 5 [IU] via SUBCUTANEOUS
  Administered 2015-07-01: 8 [IU] via SUBCUTANEOUS
  Administered 2015-07-02 (×2): 3 [IU] via SUBCUTANEOUS
  Administered 2015-07-02: 5 [IU] via SUBCUTANEOUS
  Administered 2015-07-02: 3 [IU] via SUBCUTANEOUS
  Administered 2015-07-02: 8 [IU] via SUBCUTANEOUS
  Administered 2015-07-02: 3 [IU] via SUBCUTANEOUS
  Administered 2015-07-03 (×2): 8 [IU] via SUBCUTANEOUS
  Administered 2015-07-03 (×2): 5 [IU] via SUBCUTANEOUS
  Administered 2015-07-03: 8 [IU] via SUBCUTANEOUS
  Administered 2015-07-03: 11 [IU] via SUBCUTANEOUS
  Administered 2015-07-04: 5 [IU] via SUBCUTANEOUS
  Administered 2015-07-04: 8 [IU] via SUBCUTANEOUS
  Administered 2015-07-04: 15 [IU] via SUBCUTANEOUS
  Administered 2015-07-04: 11 [IU] via SUBCUTANEOUS
  Administered 2015-07-04 – 2015-07-05 (×3): 5 [IU] via SUBCUTANEOUS
  Administered 2015-07-05: 11 [IU] via SUBCUTANEOUS
  Administered 2015-07-05 (×4): 5 [IU] via SUBCUTANEOUS
  Administered 2015-07-05: 8 [IU] via SUBCUTANEOUS
  Administered 2015-07-06: 5 [IU] via SUBCUTANEOUS
  Administered 2015-07-06: 3 [IU] via SUBCUTANEOUS
  Administered 2015-07-06 (×3): 5 [IU] via SUBCUTANEOUS
  Administered 2015-07-07: 8 [IU] via SUBCUTANEOUS
  Administered 2015-07-07: 5 [IU] via SUBCUTANEOUS
  Administered 2015-07-07: 8 [IU] via SUBCUTANEOUS
  Administered 2015-07-07: 3 [IU] via SUBCUTANEOUS
  Administered 2015-07-07 – 2015-07-08 (×4): 5 [IU] via SUBCUTANEOUS
  Administered 2015-07-08: 8 [IU] via SUBCUTANEOUS
  Administered 2015-07-08: 11 [IU] via SUBCUTANEOUS
  Administered 2015-07-08 (×2): 8 [IU] via SUBCUTANEOUS
  Administered 2015-07-09: 5 [IU] via SUBCUTANEOUS
  Administered 2015-07-09: 3 [IU] via SUBCUTANEOUS
  Administered 2015-07-09: 8 [IU] via SUBCUTANEOUS
  Administered 2015-07-09: 5 [IU] via SUBCUTANEOUS
  Administered 2015-07-09: 8 [IU] via SUBCUTANEOUS
  Administered 2015-07-09: 5 [IU] via SUBCUTANEOUS
  Administered 2015-07-09: 8 [IU] via SUBCUTANEOUS
  Administered 2015-07-10 (×2): 5 [IU] via SUBCUTANEOUS

## 2015-06-29 MED ORDER — PROPOFOL 10 MG/ML IV BOLUS
1.0000 mg/kg | Freq: Once | INTRAVENOUS | Status: AC
  Administered 2015-06-29: 54.2 mg via INTRAVENOUS

## 2015-06-29 MED ORDER — PROPOFOL 1000 MG/100ML IV EMUL
INTRAVENOUS | Status: AC
  Administered 2015-06-29: 80 ug/kg/min via INTRAVENOUS
  Filled 2015-06-29: qty 100

## 2015-06-29 MED ORDER — FENTANYL CITRATE (PF) 100 MCG/2ML IJ SOLN
INTRAMUSCULAR | Status: AC
  Administered 2015-06-29: 100 ug
  Filled 2015-06-29: qty 2

## 2015-06-29 MED ORDER — ENOXAPARIN SODIUM 30 MG/0.3ML ~~LOC~~ SOLN: 30.0000 mg | SUBCUTANEOUS | Status: DC

## 2015-06-29 MED ORDER — LEVOTHYROXINE SODIUM 100 MCG IV SOLR: 75.0000 ug | Freq: Every day | INTRAVENOUS | Status: AC

## 2015-06-29 MED ORDER — PANTOPRAZOLE SODIUM 40 MG IV SOLR
40.0000 mg | INTRAVENOUS | Status: DC
  Administered 2015-06-29 – 2015-07-03 (×5): 40 mg via INTRAVENOUS
  Filled 2015-06-29 (×5): qty 40

## 2015-06-29 MED ORDER — ASPIRIN 300 MG RE SUPP
300.0000 mg | Freq: Every day | RECTAL | Status: DC
  Administered 2015-06-29 – 2015-07-03 (×5): 300 mg via RECTAL
  Filled 2015-06-29 (×5): qty 1

## 2015-06-29 MED ORDER — SODIUM CHLORIDE 0.9 % IV SOLN
10.0000 mg/kg | Freq: Once | INTRAVENOUS | Status: AC
  Administered 2015-06-29: 542 mg via INTRAVENOUS
  Filled 2015-06-29: qty 10.84

## 2015-06-29 MED ORDER — ROCURONIUM BROMIDE 50 MG/5ML IV SOLN
50.0000 mg | Freq: Once | INTRAVENOUS | Status: AC
  Administered 2015-06-29: 50 mg via INTRAVENOUS

## 2015-06-29 MED ORDER — INSULIN ASPART 100 UNIT/ML ~~LOC~~ SOLN: 0.0000 [IU] | Freq: Every day | SUBCUTANEOUS | Status: DC

## 2015-06-29 MED ORDER — NOREPINEPHRINE BITARTRATE 1 MG/ML IV SOLN
2.0000 ug/min | INTRAVENOUS | Status: DC
  Administered 2015-06-29: 10 ug/min via INTRAVENOUS
  Administered 2015-06-30: 19 ug/min via INTRAVENOUS
  Administered 2015-07-01: 21 ug/min via INTRAVENOUS
  Administered 2015-07-01: 27 ug/min via INTRAVENOUS
  Administered 2015-07-02: 23 ug/min via INTRAVENOUS
  Administered 2015-07-02: 21.013 ug/min via INTRAVENOUS
  Administered 2015-07-03 – 2015-07-04 (×2): 27 ug/min via INTRAVENOUS
  Filled 2015-06-29 (×11): qty 16

## 2015-06-29 MED ORDER — LEVOTHYROXINE SODIUM 100 MCG IV SOLR
75.0000 ug | Freq: Every day | INTRAVENOUS | Status: DC
Start: 2015-06-30 — End: 2015-07-04
  Administered 2015-06-30 – 2015-07-03 (×4): 75 ug via INTRAVENOUS
  Filled 2015-06-29 (×4): qty 5

## 2015-06-29 MED ORDER — LORAZEPAM 2 MG/ML IJ SOLN: 2.0000 mg | INTRAMUSCULAR | Status: AC | PRN

## 2015-06-29 MED ORDER — SODIUM CHLORIDE 0.9 % IV SOLN: 200.0000 mg | Freq: Two times a day (BID) | INTRAVENOUS | Status: AC

## 2015-06-29 MED ORDER — SODIUM CHLORIDE 0.9 % IV SOLN
10.0000 mg/kg | INTRAVENOUS | Status: DC
  Filled 2015-06-29: qty 10.6

## 2015-06-29 MED ORDER — ONDANSETRON HCL 4 MG PO TABS: 4.0000 mg | ORAL_TABLET | Freq: Four times a day (QID) | ORAL | Status: AC | PRN

## 2015-06-29 MED ORDER — SODIUM CHLORIDE 0.9 % IV BOLUS (SEPSIS)
500.0000 mL | Freq: Once | INTRAVENOUS | Status: AC
  Administered 2015-06-29: 500 mL via INTRAVENOUS

## 2015-06-29 MED ORDER — MUPIROCIN 2 % EX OINT: 1.0000 "application " | TOPICAL_OINTMENT | Freq: Two times a day (BID) | CUTANEOUS | Status: AC

## 2015-06-29 MED ORDER — SODIUM CHLORIDE 0.9 % IV SOLN
200.0000 mg | Freq: Two times a day (BID) | INTRAVENOUS | Status: DC
  Administered 2015-06-29: 200 mg via INTRAVENOUS
  Filled 2015-06-29 (×2): qty 20

## 2015-06-29 MED ORDER — LORAZEPAM 2 MG/ML IJ SOLN
2.0000 mg | Freq: Once | INTRAMUSCULAR | Status: AC
  Administered 2015-06-29: 2 mg via INTRAVENOUS
  Filled 2015-06-29: qty 1

## 2015-06-29 MED ORDER — PROPOFOL 1000 MG/100ML IV EMUL
INTRAVENOUS | Status: AC
  Filled 2015-06-29: qty 100

## 2015-06-29 MED ORDER — LEVOTHYROXINE SODIUM 100 MCG IV SOLR: 75.0000 ug | Freq: Every day | INTRAVENOUS | Status: DC

## 2015-06-29 MED ORDER — LORAZEPAM 2 MG/ML IJ SOLN
2.0000 mg | INTRAMUSCULAR | Status: DC | PRN
  Filled 2015-06-29: qty 1

## 2015-06-29 MED ORDER — MIDAZOLAM HCL 2 MG/2ML IJ SOLN
2.0000 mg | Freq: Once | INTRAMUSCULAR | Status: AC
  Administered 2015-06-29: 2 mg via INTRAVENOUS

## 2015-06-29 MED ORDER — ETOMIDATE 2 MG/ML IV SOLN
20.0000 mg/kg | Freq: Once | INTRAVENOUS | Status: AC
  Administered 2015-06-29: 20 mg via INTRAVENOUS

## 2015-06-29 MED ORDER — ASPIRIN 300 MG RE SUPP: 300.0000 mg | Freq: Every day | RECTAL | Status: AC

## 2015-06-29 MED ORDER — PROPOFOL BOLUS VIA INFUSION: 1.0000 mg/kg | Freq: Once | INTRAVENOUS | Status: DC

## 2015-06-29 MED ORDER — CHLORHEXIDINE GLUCONATE 0.12% ORAL RINSE (MEDLINE KIT)
15.0000 mL | Freq: Two times a day (BID) | OROMUCOSAL | Status: DC
  Administered 2015-06-29 – 2015-07-10 (×22): 15 mL via OROMUCOSAL

## 2015-06-29 MED ORDER — ACETAMINOPHEN 325 MG PO TABS: 650.0000 mg | ORAL_TABLET | Freq: Four times a day (QID) | ORAL | Status: DC | PRN

## 2015-06-29 NOTE — Consult Note (Addendum)
Neurology Consultation Reason for Consult: possible seizures Referring Physician: Butler Denmark, S  CC: altered mental status  History is obtained from:chart, daughter  HPI: Gina Andersen is a 81 y.o. female With no history of seizures who presents with altered mental status since February 1. At that time she had a witnessed seizure and was taken to [place].She was started on Vimpat and had an EEG which showed Frequent bursts of bifrontal sharp activity with a right-sided predominance and spread to the left hemisphere lasting up to 2 seconds, interspersed with periods of attenuation.  She continued to be obtunded throughout her stay there having intermittent seizures.there was concern that she might be having more than were clinic apparent and therefore she was transferred to [facility] for continuous EEG monitoring.   ROS: Unable to obtain due to altered mental status.   Past Medical History  Diagnosis Date  . Hypothyroidism 11/01/1995  . Anxiety 11/01/1995  . Diabetes mellitus type II 05/25/1996    insulin-requiring  . Hyperlipemia 06/25/2002  . Atopic dermatitis   . History of MRI of brain and brain stem 07/10/2005    w amd w/o no mass/infarct small vess dz  . Dehydration     Hosp [facility], hyperglycemia and ketoacidosis (stopped insulin)  . Pancytopenia 06/24-7/05/2007    Tissue D/O  . History of gallstones 11/21/2007    Abd U/S multip gallstones contracted GB CBD ULN Tr hydronephr R  . Acute cholecystitis 12/1/-05/20/08    [facility], s/p lap choleycystectomy CTD DM w/hypoglycemic sz in ER  . Gallstones 05/11/08    abd U/S gallstones pericholeycystic fluid [facility]  . Gastro-esophageal reflux disease without esophagitis   . Depression   . Anxiety disorder   . Dysphagia   . Hearing impaired   . Hypertension   . Carpal tunnel syndrome   . Weakness   . Anemia   . Glaucoma   . Mood disorder (HCC)   . Kidney disease      Family History  Problem Relation Age of Onset  . Kidney  disease Mother   . Diabetes Mother   . Hypertension Mother   . Diabetes Brother   . Stroke Brother   . Stroke Brother     CVA  . Diabetes Brother      Social History:  reports that she has never smoked. She has never used smokeless tobacco. She reports that she does not drink alcohol or use illicit drugs.   Exam: Current vital signs: BP 107/49 mmHg  Pulse 49  Temp(Src) 93.6 F (34.2 C) (Axillary)  Resp 16  Ht  (1.549 m)  Wt 54.2 kg (119 lb 7.8 oz)  BMI 22.59 kg/m2  SpO2 100% Vital signs in last 24 hours: Temp:  [92.5 F (33.6 C)-98 F (36.7 C)] 93.6 F (34.2 C) (02/04 2050) Pulse Rate:  [43-70] 49 (02/04 2050) Resp:  [0-24] 16 (02/04 2050) BP: (80-236)/(37-113) 107/49 mmHg (02/04 2050) SpO2:  [95 %-100 %] 100 % (02/04 2050) FiO2 (%):  [40 %] 40 % (02/04 1938) Weight:  [54.2 kg (119 lb 7.8 oz)] 54.2 kg (119 lb 7.8 oz) (02/04 1545)   Physical Exam  Constitutional: Appears elderly Psych: Does not respond Eyes: No scleral injection HENT: No OP obstrucion Head: Normocephalic.  Cardiovascular: Normal rate and regular rhythm.  Respiratory: Rhonchorous GI: Soft.  No distension. There is no tenderness.  Skin: WDI  Neuro: Mental Status: Patient is Obtunded, she does not respond to questions or commands She does not open eyes,  but does hold them tightly shut when attempting to open them. Cranial Nerves: II: Does not blink to threat Pupils are equal, round, and reactive to light.   III,IV, VI: Eyes are midline V:VIIshe blinks to eyelid stimulation bilaterally VIII, X, XI, XII: Unable to assess secondary to patient's altered mental status.  Motor: She has extensor posturing to noxious stimuli on the right, on my first assessment she had extensor posturing to noxious stimuli on the left as well, however she subsequently began briskly withdrawing the side soon after. Sensory: Response to noxious stimuli in all 4 extremities Cerebellar:Will not perform   I have  reviewed labs in epic and the results pertinent to this consultation are: Dilantin level 7.9  I have reviewed the images obtained: MRI brain-there is bifrontal diffusion abnormality  could be related to bifrontal seizures.The stroke that was seen could indeed be an ischemic infarct, or could be   An EEG was connected which I reviewed and reveals a periodicpattern with some evidence of evolution as well asintermittent periods of suppressionmaximal in the left frontal region with some spread to the right.  Impression: 81 year old female with new onset seizures without return to baseline in between seizures. This coupled with the pattern on EEG make me suspect that the pattern does represent ongoing seizure. Without another explanation for her obtundation, I would favor treating this aggressively. I have discussed this with the daughter And plan to transfer her to the ICUTo pursue burst suppression.  Recommendations: 1) Transferred to the ICU for burst suppression with propofol 2) Continue Vimpat and Dilantin,give additional Dilantin load (10 mg/kg )given low level 3) continuous EEG 4) I will continue to follow   This patient is critically ill and at significant risk of neurological worsening, death and care requires constant monitoring of vital signs, hemodynamics,respiratory and cardiac monitoring, neurological assessment, discussion with family, other specialists and medical decision making of high complexity. I spent 90 minutes of neurocritical care time  in the care of  this patient.  Ritta Slot, MD [facility] [phone]  If 7pm- 7am, please page neurology on call as listed in AMION. 07/16/2015  9:20 PM

## 2015-06-29 NOTE — Progress Notes (Signed)
LTVM up and running, no skin breakdown seen.

## 2015-06-29 NOTE — Progress Notes (Signed)
Pt discharged to [facility] for neuro step down care d/t continuing seizures.  Repost called to RN at [facility], pt was transported via [facility].  IVs left in place, tele monitor turned in.

## 2015-06-29 NOTE — Progress Notes (Signed)
SLP Cancellation Note  Patient Details Name: Gina Andersen MRN: [id number] DOB: [birth year]   Cancelled treatment:       Reason Eval/Treat Not Completed: Fatigue/lethargy limiting ability to participate;Medical issues which prohibited therapy Reviewed chart and spoke with nsg. Pt remains poorly arousable and not appropriate at this time for swallow eval. Will continue to follow and will eval when pt is more alert.    [name],[name] 12-Jul-2015, 10:07 AM

## 2015-06-29 NOTE — Progress Notes (Addendum)
Pt arrived emergently from 3S, bedside report given, Neuro MD and CCM MD at bedside, pt intubated and central line placed.  Pt on continuous EEG.  Chest XR done to confirm placement, OG placed, Levo started, propofol boluses initiated.  KUB ordered, pending. Report passed along to pm RN.

## 2015-06-29 NOTE — Progress Notes (Signed)
Patient's temp dropping from 34.2 to 33.7; not responding to warm blankets. MD notified, orders received to apply bair hugger with goal temp of 36. RN will continue to monitor patient closely.

## 2015-06-29 NOTE — Progress Notes (Signed)
Post intubation abg collected

## 2015-06-29 NOTE — Discharge Summary (Addendum)
[facility] [place] at [facility]   PATIENT NAME: Gina Andersen    MR#:  [id number]  DATE OF BIRTH:  January 15, 1935  DATE OF ADMISSION:  06/26/2015 ADMITTING PHYSICIAN: Oralia Manis, MD  DATE OF DISCHARGE: 07/06/2015  PRIMARY CARE PHYSICIAN: Nelda Bucks., MD    ADMISSION DIAGNOSIS:  Aphasia [R47.01] Seizure (HCC) [R56.9] UTI (lower urinary tract infection) [N39.0]  DISCHARGE DIAGNOSIS:  Principal Problem:   Seizures (HCC) Active Problems:   Hypothyroidism   HLD (hyperlipidemia)   UTI (lower urinary tract infection)   DM (diabetes mellitus), type 2, uncontrolled (HCC)   HTN (hypertension)   CKD (chronic kidney disease), stage V (HCC)   SECONDARY DIAGNOSIS:   Past Medical History  Diagnosis Date  . Hypothyroidism 11/01/1995  . Anxiety 11/01/1995  . Diabetes mellitus type II 05/25/1996    insulin-requiring  . Hyperlipemia 06/25/2002  . Atopic dermatitis   . History of MRI of brain and brain stem 07/10/2005    w amd w/o no mass/infarct small vess dz  . Dehydration     Hosp [facility], hyperglycemia and ketoacidosis (stopped insulin)  . Pancytopenia 06/24-7/05/2007    Tissue D/O  . History of gallstones 11/21/2007    Abd U/S multip gallstones contracted GB CBD ULN Tr hydronephr R  . Acute cholecystitis 12/1/-05/20/08    [facility], s/p lap choleycystectomy CTD DM w/hypoglycemic sz in ER  . Gallstones 05/11/08    abd U/S gallstones pericholeycystic fluid [facility]  . Gastro-esophageal reflux disease without esophagitis   . Depression   . Anxiety disorder   . Dysphagia   . Hearing impaired   . Hypertension   . Carpal tunnel syndrome   . Weakness   . Anemia   . Glaucoma   . Mood disorder (HCC)   . Kidney disease     HOSPITAL COURSE:   81 year old female with a history of hypothyroidism and diabetes who presented with seizure and encephalopathy.  1. Acute small infarct in the right corona radiata: Continue rectal aspirin as patient is not  responsive. She is on neuro checks. Appreciate neurology consultation. ECHO pending Carotid ultrasound without hemodynamically significant stenosis.   2. Seizures: Nursing staff is reporting probable seizures. EEG shows normal drowsy state versus postictal state. Continue IV Vimpat Continue seizure precaution. Further recommendations as per neurology. She needs continuous EEG monitoring and therefore it would be best served at a tertiary care unit.   3. Acute encephalopathy: This is from seizures/postictal state and acute small infarct. Continue neuro checks. This is not near patient's baseline.   4. Diabetes type 2: Continue sliding scale insulin and Accu-Cheks. Patient is nothing by mouth.  5. Recent urinary tract infection: Pincus Sanes has been discontinued due to the fact that it can lower seizure threshold and urine culture was negative.  6. Acute on chronic kidney disease stage IV: Avoid nephrotoxic agents. Continue IV fluids. Creatinine was some improvement. 7. Hypothyroidism: CheckingTSH.   8. Elevated troponin: This is due to demand ischemia and not ACS. Troponins are flat. DISCHARGE CONDITIONS AND DIET:   Patient stable for transfer CONSULTS OBTAINED:  Treatment Team:  Kym Groom, MD  DRUG ALLERGIES:   Allergies  Allergen Reactions  . Sulfa Antibiotics Other (See Comments)    Reaction:  Unknown   . Hydrocodone-Acetaminophen Other (See Comments)    Reaction:  Dizziness   . Lamictal [Lamotrigine] Other (See Comments)    Reaction:  Altered mental status   . Lyrica [Pregabalin] Other (See Comments)    Reaction:  Altered mental status   . Meloxicam Nausea And Vomiting  . Prednisone Other (See Comments)    Reaction:  Altered mental status   . Seroquel [Quetiapine] Other (See Comments)    Reaction:  Altered mental status   . Zoloft [Sertraline] Other (See Comments)    Reaction:  Altered mental status   . Penicillins Rash and Other (See Comments)    Unable to  obtain enough information to answer additional questions about this medication.      DISCHARGE MEDICATIONS:   Current Discharge Medication List    START taking these medications   Details  aspirin 300 MG suppository Place 1 suppository (300 mg total) rectally daily. Qty: 12 suppository, Refills: 0    lacosamide 200 mg in sodium chloride 0.9 % 25 mL Inject 200 mg into the vein every 12 (twelve) hours. Qty: 200 mg, Refills: 0    levothyroxine (SYNTHROID, LEVOTHROID) 100 MCG SOLR injection Inject 3.75 mLs (75 mcg total) into the vein daily. Qty: 30 each, Refills: 0    ondansetron (ZOFRAN) 4 MG tablet Take 1 tablet (4 mg total) by mouth every 6 (six) hours as needed for nausea. Qty: 20 tablet, Refills: 0      CONTINUE these medications which have CHANGED   Details  LORazepam (ATIVAN) 2 MG/ML injection Inject 1 mL (2 mg total) into the vein every 4 (four) hours as needed for seizure. Qty: 1 mL, Refills: 0    mupirocin ointment (BACTROBAN) 2 % Place 1 application into the nose 2 (two) times daily. Qty: 22 g, Refills: 0      STOP taking these medications     bisacodyl (DULCOLAX) 10 MG suppository      bisacodyl (DULCOLAX) 5 MG EC tablet      cholecalciferol (VITAMIN D) 1000 UNITS tablet      conjugated estrogens (PREMARIN) vaginal cream      dextrose (GLUTOSE) 40 % GEL      Ertapenem Sodium (INVANZ IV)      ferrous sulfate 325 (65 FE) MG tablet      glucagon 1 MG injection      insulin detemir (LEVEMIR) 100 UNIT/ML injection      insulin lispro (HUMALOG) 100 UNIT/ML injection      levothyroxine (SYNTHROID, LEVOTHROID) 150 MCG tablet      levothyroxine (SYNTHROID, LEVOTHROID) 25 MCG tablet      lidocaine (XYLOCAINE) 2 % jelly      LORazepam (ATIVAN) 1 MG tablet      Nutritional Supplements (SUPLENA/CARB STEADY PO)      omeprazole (PRILOSEC) 20 MG capsule      polyethylene glycol (MIRALAX / GLYCOLAX) packet      risperiDONE (RISPERDAL) 0.25 MG tablet       travoprost, benzalkonium, (TRAVATAN) 0.004 % ophthalmic solution      Zinc Oxide (BOUDREAUXS BUTT PASTE EX)      levothyroxine (SYNTHROID, LEVOTHROID) 75 MCG tablet      LORazepam (ATIVAN) 0.5 MG tablet               Today   CHIEF COMPLAINT:  Patient is not responsive   VITAL SIGNS:  Blood pressure 133/47, pulse 63, temperature 97.5 F (36.4 C), temperature source Oral, resp. rate 16, height 5\' 1"  (1.549 m), weight 52.98 kg (116 lb 12.8 oz), SpO2 99 %.   REVIEW OF SYSTEMS:  Review of Systems  Unable to perform ROS    PHYSICAL EXAMINATION:  GENERAL:  81 y.o.-year-old patient lying in the bed with no  acute distress.  NECK:  Supple, no jugular venous distention. No thyroid enlargement, no tenderness.  LUNGS: Normal breath sounds bilaterally, no wheezing, rales,rhonchi  No use of accessory muscles of respiration.  CARDIOVASCULAR: S1, S2 normal. No murmurs, rubs, or gallops.  ABDOMEN: Soft, non-tender, non-distended. Bowel sounds present. No organomegaly or mass.  EXTREMITIES: No pedal edema, cyanosis, or clubbing.  PSYCHIATRIC: The patient is drowsy SKIN: No obvious rash, lesion, or ulcer.   DATA REVIEW:   CBC  Recent Labs Lab 06/28/15 0552  WBC 6.2  HGB 9.1*  HCT 27.3*  PLT 106*    Chemistries   Recent Labs Lab 07/05/2015 0524  NA 142  K 4.6  CL 116*  CO2 17*  GLUCOSE 148*  BUN 57*  CREATININE 2.62*  CALCIUM 8.9    Cardiac Enzymes  Recent Labs Lab 06/27/15 0120 06/27/15 0556 06/27/15 1151  TROPONINI 0.05* 0.05* 0.06*    Microbiology Results  @  RADIOLOGY:  US Carotid Bilateral  06/27/2015  CLINICAL DATA:  Stroke symptoms, hypertension, hyperlipidemia and diabetes. EXAM: BILATERAL CAROTID DUPLEX ULTRASOUND TECHNIQUE: Wallace Cullens scale imaging, color Doppler and duplex ultrasound were performed of bilateral carotid and vertebral arteries in the neck. COMPARISON:  06/27/2015 FINDINGS: Criteria: Quantification of carotid stenosis is  based on velocity parameters that correlate the residual internal carotid diameter with NASCET-based stenosis levels, using the diameter of the distal internal carotid lumen as the denominator for stenosis measurement. The following velocity measurements were obtained: RIGHT ICA:  69/22 cm/sec CCA:  62/15 cm/sec SYSTOLIC ICA/CCA RATIO:  1.1 DIASTOLIC ICA/CCA RATIO:  1.4 ECA:  63 cm/sec LEFT ICA:  90/35 cm/sec CCA:  97/27 cm/sec SYSTOLIC ICA/CCA RATIO:  0.9 DIASTOLIC ICA/CCA RATIO:  1.3 ECA:  115 cm/sec RIGHT CAROTID ARTERY: Minor atherosclerotic plaque formation. No hemodynamically significant right ICA stenosis, velocity elevation, or turbulent flow. Degree of narrowing less than 50%. RIGHT VERTEBRAL ARTERY:  Antegrade LEFT CAROTID ARTERY: Similar scattered minor atherosclerotic plaque formation. No hemodynamically significant left ICA stenosis, velocity elevation, or turbulent flow. LEFT VERTEBRAL ARTERY:  Antegrade IMPRESSION: Minor carotid atherosclerosis. No hemodynamically significant ICA stenosis. Degree of narrowing less than 50% bilaterally. Patent antegrade vertebral flow bilaterally Electronically Signed   By: Judie Petit.  Shick M.D.   On: 06/27/2015 15:10      Stable for discharge   Patient should follow up with NEUROLOGY  CODE STATUS:     Code Status Orders        Start     Ordered   06/26/15 2355  Full code   Continuous     06/26/15 2354    Code Status History    Date Active Date Inactive Code Status Order ID Comments User Context   01/12/2015 12:45 AM 01/18/2015  7:51 PM Full Code [id number]  Crissie Figures, MD Inpatient   12/08/2012  5:18 PM 12/11/2012 12:24 PM Full Code [id number]  Claudie Fisherman Inpatient   12/05/2012  7:27 PM 12/08/2012  5:18 PM Full Code [id number]  Storm Frisk, MD Inpatient      TOTAL TIME TAKING CARE OF THIS PATIENT: 35 minutes.    Note: This dictation was prepared with Dragon dictation along with smaller phrase technology. Any transcriptional errors that  result from this process are unintentional.  Damond Borchers M.D on 07/07/2015 at 2:53 PM  Between 7am to 6pm - Pager - [phone] After 6pm go to [email or web address] - password [email or web address] [facility]  [facility]  Office  [phone]  CC: Primary care physician; Nelda Bucks., MD

## 2015-06-29 NOTE — Procedures (Addendum)
Central Venous Catheter Insertion Procedure Note ELEANORA GUINYARD [id number] 04/09/1935  Procedure: Insertion of Central Venous Catheter Indications: Assessment of intravascular volume and Drug and/or fluid administration  Procedure Details Consent: Unable to obtain consent because of emergent medical necessity. Time Out: Verified patient identification, verified procedure, site/side was marked, verified correct patient position, special equipment/implants available, medications/allergies/relevent history reviewed, required imaging and test results available.  Performed  Maximum sterile technique was used including antiseptics, cap, gloves, gown, hand hygiene, mask and sheet. Skin prep: Chlorhexidine; local anesthetic administered A antimicrobial bonded/coated triple lumen catheter was placed in the left internal jugular vein using the Seldinger technique.  Evaluation Blood flow good Complications: No apparent complications Patient did tolerate procedure well. Chest X-ray ordered to verify placement.  CXR: pending.  U/S used in placement.  YACOUB,WESAM 07-14-2015, 6:52 PM

## 2015-06-29 NOTE — Progress Notes (Signed)
Pt intubated per Molli Knock, MD note at 18:52pm 2/4/207. No documentation per Mechanical Ventilation management or vent check done prior to my assessment. No LDA noted in the Flowsheet. LDA updated solely on MD note per equipment used and time.

## 2015-06-29 NOTE — Progress Notes (Signed)
Pt had 3 consecutive seizures each lasting 30 sec-1 min, 2 min apart. Prn Ativan given. Pt had 2 additional seizures 5 min after medication given. Additional seizures also lasted 30 sec-1 min and were 2-3 min apart. Pt's whole body shaking, R side more than L. Pt remained nonverbal and lethargic after all seizure activity. MD Dr. Nemiah Commander notified. No new orders given. RN will continue to monitor. Syliva Overman, RN

## 2015-06-29 NOTE — H&P (Signed)
[facility] History and Physical  [name] UEA:[id number] DOB: 21-Oct-1934 DOA: 07/05/2015  Referring physician: Rosiland Oz PCP: Nelda Bucks., MD   Chief Complaint: seizure  HPI: Gina Andersen is a 81 y.o. female he was admitted to [facility] 4 days ago with acute encephalopathy in the setting of seizures in the setting of hypoglycemia. She was being treated in her nursing facility for UTI known to have ESBL. At [facility] revealed CVA and continuous seizure. [facility] for [facility]  See notes per internal medicine and neurology at [facility]   Review of Systems:  Able to obtained review of systems due to acute encephalopathy and continue seizures   Past Medical History  Diagnosis Date  . Hypothyroidism 11/01/1995  . Anxiety 11/01/1995  . Diabetes mellitus type II 05/25/1996    insulin-requiring  . Hyperlipemia 06/25/2002  . Atopic dermatitis   . History of MRI of brain and brain stem 07/10/2005    w amd w/o no mass/infarct small vess dz  . Dehydration     Hosp [facility], hyperglycemia and ketoacidosis (stopped insulin)  . Pancytopenia 06/24-7/05/2007    Tissue D/O  . History of gallstones 11/21/2007    Abd U/S multip gallstones contracted GB CBD ULN Tr hydronephr R  . Acute cholecystitis 12/1/-05/20/08    [facility], s/p lap choleycystectomy CTD DM w/hypoglycemic sz in ER  . Gallstones 05/11/08    abd U/S gallstones pericholeycystic fluid [facility]  . Gastro-esophageal reflux disease without esophagitis   . Depression   . Anxiety disorder   . Dysphagia   . Hearing impaired   . Hypertension   . Carpal tunnel syndrome   . Weakness   . Anemia   . Glaucoma   . Mood disorder (HCC)   . Kidney disease    Past Surgical History  Procedure Laterality Date  . Partial hysterectomy      OV intact  . Carpel tunn      right  . Trigger finger release      left  . Ganglionectomy    . Cholecystectomy    . Abdominal hysterectomy     Social  History:  reports that she has never smoked. She has never used smokeless tobacco. She reports that she does not drink alcohol or use illicit drugs.  Allergies  Allergen Reactions  . Sulfa Antibiotics Other (See Comments)    Reaction:  Unknown   . Hydrocodone-Acetaminophen Other (See Comments)    Reaction:  Dizziness   . Lamictal [Lamotrigine] Other (See Comments)    Reaction:  Altered mental status   . Lyrica [Pregabalin] Other (See Comments)    Reaction:  Altered mental status   . Meloxicam Nausea And Vomiting  . Prednisone Other (See Comments)    Reaction:  Altered mental status   . Seroquel [Quetiapine] Other (See Comments)    Reaction:  Altered mental status   . Zoloft [Sertraline] Other (See Comments)    Reaction:  Altered mental status   . Penicillins Rash and Other (See Comments)    Unable to obtain enough information to answer additional questions about this medication.      Family History  Problem Relation Age of Onset  . Kidney disease Mother   . Diabetes Mother   . Hypertension Mother   . Diabetes Brother   . Stroke Brother   . Stroke Brother     CVA  . Diabetes Brother      Prior to Admission medications   Medication Sig Start  Date End Date Taking? Authorizing Provider  aspirin 300 MG suppository Place 1 suppository (300 mg total) rectally daily. 07/14/2015   Adrian Saran, MD  lacosamide 200 mg in sodium chloride 0.9 % 25 mL Inject 200 mg into the vein every 12 (twelve) hours. 07/17/2015   Adrian Saran, MD  levothyroxine (SYNTHROID, LEVOTHROID) 100 MCG SOLR injection Inject 3.75 mLs (75 mcg total) into the vein daily. 07/13/2015   Adrian Saran, MD  LORazepam (ATIVAN) 2 MG/ML injection Inject 1 mL (2 mg total) into the vein every 4 (four) hours as needed for seizure. 07/22/2015   Adrian Saran, MD  mupirocin ointment (BACTROBAN) 2 % Place 1 application into the nose 2 (two) times daily. 07/23/2015   Adrian Saran, MD  ondansetron (ZOFRAN) 4 MG tablet Take 1 tablet (4 mg total) by mouth every 6  (six) hours as needed for nausea. 07/07/2015   Adrian Saran, MD   Physical Exam: Filed Vitals:   06/28/2015 1545  BP: 89/37  Pulse: 61  Temp: 97.8 F (36.6 C)  TempSrc: Axillary  Resp: 11  Height: 5\' 1"  (1.549 m)  Weight: 54.2 kg (119 lb 7.8 oz)  SpO2: 99%    Wt Readings from Last 3 Encounters:  06/28/2015 54.2 kg (119 lb 7.8 oz)  06/27/15 52.98 kg (116 lb 12.8 oz)  02/20/15 59.875 kg (132 lb)    General:  Thin and frail nonresponsive some posturing Eyes: PERRL, normal lids, irises & conjunctiva ENT: grossly normal hearing, lips & tongue Neck: no LAD, masses or thyromegaly Cardiovascular: RRR, no m/r/g. No LE edema. Telemetry:  Respiratory:  Somewhat shallow very rhonchus throughout no wheezing no crackles Abdomen: soft, ntnd positive bowel sounds Skin: no rash or induration seen on limited exam Musculoskeletal: grossly normal tone BUE/BLE Psychiatric: grossly normal mood and affect, speech fluent and appropriate Neurologic: Responds to tactile UA on the left extends posturing on the right           Labs on Admission:  Basic Metabolic Panel:  Recent Labs Lab 06/24/15 1200 06/26/15 1917 06/27/15 0556 06/28/15 0552 06/30/2015 0524  NA 137 142 144 141 142  K 3.9 4.5 4.3 4.1 4.6  CL 110 117* 121* 117* 116*  CO2 22 17* 20* 18* 17*  GLUCOSE 258* 168* 112* 176* 148*  BUN 65* 62* 58* 60* 57*  CREATININE 3.27* 3.44* 2.99* 2.75* 2.62*  CALCIUM 9.2 8.6* 8.9 8.8* 8.9   Liver Function Tests:  Recent Labs Lab 07/15/2015 1405  AST 25  ALT 14  ALKPHOS 75  BILITOT 0.4  PROT 5.9*  ALBUMIN 2.3*   No results for input(s): LIPASE, AMYLASE in the last 168 hours. No results for input(s): AMMONIA in the last 168 hours. CBC:  Recent Labs Lab 06/24/15 1200 06/26/15 1917 06/27/15 0556 06/28/15 0552  WBC 8.3 4.5 5.2 6.2  NEUTROABS 6.5  --   --   --   HGB 10.2* 9.0* 9.5* 9.1*  HCT 30.4* 27.6* 28.5* 27.3*  MCV 87.2 89.5 88.7 88.5  PLT 112* 100* 100* 106*   Cardiac  Enzymes:  Recent Labs Lab 06/27/15 0120 06/27/15 0556 06/27/15 1151 06/27/15 1449  CKTOTAL  --   --   --  66  TROPONINI 0.05* 0.05* 0.06*  --     BNP (last 3 results) No results for input(s): BNP in the last 8760 hours.  ProBNP (last 3 results) No results for input(s): PROBNP in the last 8760 hours.  CBG:  Recent Labs Lab 06/28/15 2011 07/09/2015 0005 07/15/2015  6269 07/22/2015 0750 07/11/2015 1133  GLUCAP 143* 105* 135* 115* 136*    Radiological Exams on Admission: No results found.  EKG:   Assessment/Plan Principal Problem:   Seizures (HCC) Active Problems:   Hypothyroidism   DM (diabetes mellitus), type 2, uncontrolled (HCC)   Chronic anemia   HTN (hypertension)   CKD (chronic kidney disease), stage V (HCC)   CVA (cerebral vascular accident) (HCC)   Seizure (HCC)  #1. Seizures. EEG done at [facility] per chart review showed drowsy state versus postictal state. Concerns for continuous seizing. -Admit to step down -Neurology consult -Seizure precautions -Continue Ativan -Continue Dilantin -EEG per neurology  #2. Acute small infarct. Per CT 3 days ago at [facility] -Continue rectal aspirin -Workup completed [place] -Continue neuro checks  #3. Acute encephalopathy due to CVA and seizures -See above  #4. UTI. Cultures were negative at [facility]. Febrile -Antibiotics complete  #5. Chronic kidney disease. Stage IV Creatinine on admission 2.6. At baseline -hold nephrotoxins  #6. Hypertension. Blood pressure soft on admission. -Monitor and stepdown -IV fluids per  #7. Diabetes. -Obtain hemoglobin A1c -Sliding scale -She remains nothing by mouth  #8. Hypothyroidism -Continue home meds   neurology Code Status: full DVT Prophylaxis: Family Communication: none Disposition Plan: tba  Time spent: 65 minutes  [facility]  I have examined the patient, reviewed the chart and modified the above note which I agree with. Seizures  have not been improving with Vimpat. She was loaded with Dilantin by neurology today at [facility] and then transferred to [facility] for continuous EEG monitoring. EEG leads being placed and Dr Aneta Mins will evaluate whether she is having seizure activity.   Kelley Polinsky,MD Pager # [email or web address] 07/09/2015, 5:09 PM

## 2015-06-29 NOTE — Consult Note (Addendum)
PULMONARY / CRITICAL CARE MEDICINE   Name: Gina Andersen MRN: [id number] DOB: 11/02/1934    ADMISSION DATE:  06/27/2015 CONSULTATION DATE:  07/16/2015  REFERRING MD:  [email or web address]-MD  CHIEF COMPLAINT:  Status epilepticus  HISTORY OF PRESENT ILLNESS:   81 year old female who was treated for a UTI in the SNF who was admitted to [place] for AMS and was noted to have a stroke and subsequently developed a seizures and was transferred to [facility] for burst suppression.  Patient was evaluated by neurology and they requested that we intubate the patient for burst suppression with propofol.  Upon evaluation, patient is not responsive and is not protecting her airway.  Will transfer to the ICU, intubate and mechanically ventilate.  PAST MEDICAL HISTORY :  She  has a past medical history of Hypothyroidism (11/01/1995); Anxiety (11/01/1995); Diabetes mellitus type II (05/25/1996); Hyperlipemia (06/25/2002); Atopic dermatitis; History of MRI of brain and brain stem (07/10/2005); Dehydration; Pancytopenia (06/24-7/05/2007); History of gallstones (11/21/2007); Acute cholecystitis (12/1/-05/20/08); Gallstones (05/11/08); Gastro-esophageal reflux disease without esophagitis; Depression; Anxiety disorder; Dysphagia; Hearing impaired; Hypertension; Carpal tunnel syndrome; Weakness; Anemia; Glaucoma; Mood disorder (HCC); and Kidney disease.  PAST SURGICAL HISTORY: She  has past surgical history that includes Partial hysterectomy; carpel tunn; Trigger finger release; Ganglionectomy; Cholecystectomy; and Abdominal hysterectomy.  Allergies  Allergen Reactions  . Sulfa Antibiotics Other (See Comments)    Reaction:  Unknown   . Hydrocodone-Acetaminophen Other (See Comments)    Reaction:  Dizziness   . Lamictal [Lamotrigine] Other (See Comments)    Reaction:  Altered mental status   . Lyrica [Pregabalin] Other (See Comments)    Reaction:  Altered mental status   . Meloxicam Nausea And Vomiting  . Prednisone Other (See  Comments)    Reaction:  Altered mental status   . Seroquel [Quetiapine] Other (See Comments)    Reaction:  Altered mental status   . Zoloft [Sertraline] Other (See Comments)    Reaction:  Altered mental status   . Penicillins Rash and Other (See Comments)    Unable to obtain enough information to answer additional questions about this medication.      No current facility-administered medications on file prior to encounter.   Current Outpatient Prescriptions on File Prior to Encounter  Medication Sig  . aspirin 300 MG suppository Place 1 suppository (300 mg total) rectally daily.  Marland Kitchen lacosamide 200 mg in sodium chloride 0.9 % 25 mL Inject 200 mg into the vein every 12 (twelve) hours.  Marland Kitchen levothyroxine (SYNTHROID, LEVOTHROID) 100 MCG SOLR injection Inject 3.75 mLs (75 mcg total) into the vein daily.  Marland Kitchen LORazepam (ATIVAN) 2 MG/ML injection Inject 1 mL (2 mg total) into the vein every 4 (four) hours as needed for seizure.  . mupirocin ointment (BACTROBAN) 2 % Place 1 application into the nose 2 (two) times daily.  . ondansetron (ZOFRAN) 4 MG tablet Take 1 tablet (4 mg total) by mouth every 6 (six) hours as needed for nausea.    FAMILY HISTORY:  Her indicated that her mother is deceased. She indicated that her father is deceased. She indicated that two of her three brothers are alive.   SOCIAL HISTORY: She  reports that she has never smoked. She has never used smokeless tobacco. She reports that she does not drink alcohol or use illicit drugs.  REVIEW OF SYSTEMS:   Unattainable.  SUBJECTIVE:  Unresponsive  VITAL SIGNS: BP 89/37 mmHg  Pulse 61  Temp(Src) 97.8 F (36.6 C) (Axillary)  Resp 11  Ht  (1.549 m)  Wt 54.2 kg (119 lb 7.8 oz)  BMI 22.59 kg/m2  SpO2 99%  HEMODYNAMICS:    VENTILATOR SETTINGS:    INTAKE / OUTPUT:    PHYSICAL EXAMINATION: General:  Chronically ill appearing female, in no respiratory distress. Neuro:  Intact gag, extensor posturing on right and  withdraw on left. HEENT:  [place]/AT, PERRL, EOM-I and MMM. Cardiovascular:  RRR, Nl S1/S2, -M/R/G. Lungs:  Coarse BS diffusely. Abdomen:  Soft, NT, ND and +BS. Musculoskeletal:  -edema and -tender. Skin:  Intact.  LABS:  BMET  Recent Labs Lab 06/27/15 0556 06/28/15 0552 07-07-2015 0524  NA 144 141 142  K 4.3 4.1 4.6  CL 121* 117* 116*  CO2 20* 18* 17*  BUN 58* 60* 57*  CREATININE 2.99* 2.75* 2.62*  GLUCOSE 112* 176* 148*    Electrolytes  Recent Labs Lab 06/27/15 0556 06/28/15 0552 July 07, 2015 0524  CALCIUM 8.9 8.8* 8.9    CBC  Recent Labs Lab 06/26/15 1917 06/27/15 0556 06/28/15 0552  WBC 4.5 5.2 6.2  HGB 9.0* 9.5* 9.1*  HCT 27.6* 28.5* 27.3*  PLT 100* 100* 106*    Coag's No results for input(s): APTT, INR in the last 168 hours.  Sepsis Markers No results for input(s): LATICACIDVEN, PROCALCITON, O2SATVEN in the last 168 hours.  ABG No results for input(s): PHART, PCO2ART, PO2ART in the last 168 hours.  Liver Enzymes  Recent Labs Lab July 07, 2015 1405  AST 25  ALT 14  ALKPHOS 75  BILITOT 0.4  ALBUMIN 2.3*    Cardiac Enzymes  Recent Labs Lab 06/27/15 0120 06/27/15 0556 06/27/15 1151  TROPONINI 0.05* 0.05* 0.06*    Glucose  Recent Labs Lab 06/28/15 1709 06/28/15 2011 07-07-15 0005 07-07-2015 0348 2015-07-07 0750 2015-07-07 1133  GLUCAP 100* 143* 105* 135* 115* 136*    Imaging No results found.   STUDIES:  Head CT with right sided CVA.  CULTURES: None  ANTIBIOTICS: None  SIGNIFICANT EVENTS: 2/4 VDRF for seizure and burst suppression.  LINES/TUBES: ETT 2/4>>> L IJ TLC 2/4  DISCUSSION: 81 year old female with PMH seizure will transfer to the ICU for intubation and burst suppression.  ASSESSMENT / PLAN:  PULMONARY A: VDRF for burst suppression. P:   - Intubate. - Full vent support. - F/U ABG and CXR.  CARDIOVASCULAR A:  Drug induced hypotension P:  - Levophed for BP support. - Follow CVP. -  Hydration.  RENAL A:   Hyperchloremic metabolic acidosis. Acute on chronic renal failure. P:   - Hydrate. - BMET in AM. - Replace electrolytes as indicated.  GASTROINTESTINAL A:   No active issues. P:   - LFT. - TF per nutrition. - Protonix.  HEMATOLOGIC A:   No active issues. P:  - Monitor.  INFECTIOUS A:   No active issues. P:   - Monitor fever and WBC.  ENDOCRINE A:   DM. P:   - CBGs. - ISS.  NEUROLOGIC A:   Seizure and CVA. P:   RASS goal: -5 - Propofol. - Continuous EEG. - Neurology following.  FAMILY  - Updates: No family bedside.  The patient is critically ill with multiple organ systems failure and requires high complexity decision making for assessment and support, frequent evaluation and titration of therapies, application of advanced monitoring technologies and extensive interpretation of multiple databases.   Critical Care Time devoted to patient care services described in this note is  35  Minutes. This time reflects time of care of this signee  Dr Koren Bound. This critical care time does not reflect procedure time, or teaching time or supervisory time of PA/NP/Med student/Med Resident etc but could involve care discussion time.  Alyson Reedy, M.D. [facility] Pulmonary/Critical Care Medicine. Pager: [phone]. After hours pager: [phone].  06/27/2015, 6:07 PM

## 2015-06-29 NOTE — Progress Notes (Signed)
[facility]   PATIENT NAME: Gina Andersen    MR#:  [id number]  DATE OF BIRTH:  05/20/1935  SUBJECTIVE:   As per nursing staff patient has been drowsy and unresponsive through the night.  REVIEW OF SYSTEMS:    Review of Systems  Unable to perform ROS   Tolerating Diet:npo      DRUG ALLERGIES:   Allergies  Allergen Reactions  . Sulfa Antibiotics Other (See Comments)    Reaction:  Unknown   . Hydrocodone-Acetaminophen Other (See Comments)    Reaction:  Dizziness   . Lamictal [Lamotrigine] Other (See Comments)    Reaction:  Altered mental status   . Lyrica [Pregabalin] Other (See Comments)    Reaction:  Altered mental status   . Meloxicam Nausea And Vomiting  . Prednisone Other (See Comments)    Reaction:  Altered mental status   . Seroquel [Quetiapine] Other (See Comments)    Reaction:  Altered mental status   . Zoloft [Sertraline] Other (See Comments)    Reaction:  Altered mental status   . Penicillins Rash and Other (See Comments)    Unable to obtain enough information to answer additional questions about this medication.      VITALS:  Blood pressure 133/47, pulse 63, temperature 97.5 F (36.4 C), temperature source Oral, resp. rate 16, height 5\' 1"  (1.549 m), weight 52.98 kg (116 lb 12.8 oz), SpO2 99 %.  PHYSICAL EXAMINATION:   Physical Exam  Constitutional: No distress.  Frail appearing  HENT:  Head: Normocephalic.  Eyes: No scleral icterus.  Neck: No JVD present. No tracheal deviation present.  Cardiovascular: Normal rate, regular rhythm and normal heart sounds.  Exam reveals no gallop and no friction rub.   No murmur heard. Pulmonary/Chest: Effort normal and breath sounds normal. No stridor. No respiratory distress. She has no wheezes. She has no rales. She exhibits no tenderness.  Abdominal: Soft. Bowel sounds are normal. She exhibits no distension and no mass. There is no tenderness. There is no rebound  and no guarding.  Musculoskeletal: Normal range of motion. She exhibits no edema.  Neurological:  drowsy  Skin: Skin is warm. No rash noted. No erythema.      LABORATORY PANEL:   CBC  Recent Labs Lab 06/28/15 0552  WBC 6.2  HGB 9.1*  HCT 27.3*  PLT 106*   ------------------------------------------------------------------------------------------------------------------  Chemistries   Recent Labs Lab 06/30/2015 0524  NA 142  K 4.6  CL 116*  CO2 17*  GLUCOSE 148*  BUN 57*  CREATININE 2.62*  CALCIUM 8.9   ------------------------------------------------------------------------------------------------------------------  Cardiac Enzymes  Recent Labs Lab 06/27/15 0120 06/27/15 0556 06/27/15 1151  TROPONINI 0.05* 0.05* 0.06*   ------------------------------------------------------------------------------------------------------------------  RADIOLOGY:  Mr Brain Wo Contrast  06/27/2015  CLINICAL DATA:  81 year old diabetic hypertensive female presents with seizures. Remote seizure from hypoglycemia. Currently being treated for urinary tract infection. Subsequent encounter. EXAM: MRI HEAD WITHOUT CONTRAST MRA HEAD WITHOUT CONTRAST TECHNIQUE: Multiplanar, multiecho pulse sequences of the brain and surrounding structures were obtained without intravenous contrast. Angiographic images of the head were obtained using MRA technique without contrast. COMPARISON:  06/26/2015 head CT 07/10/2005 brain MR. FINDINGS: MRI HEAD FINDINGS Exam is motion degraded. Abnormal signal best seen on diffusion sequence involving medial aspect of the frontal and parietal lobes greater on the left. No surrounding FLAIR abnormality to suggest herpes encephalitis or limbic encephalitis. Findings probably related to seizure activity (and reversible) although similar findings can be seen in  setting of episode of significant hypoglycemia Appearance not typical for hypertensive encephalopathy. Suspect very  small acute nonhemorrhagic right corona radiata infarct. Remote small posterior right coronal radiata infarct. Marked confluent white matter changes have progressed since prior MR most consistent with result of small vessel disease. Global atrophy without hydrocephalus. No intracranial mass lesion noted on this unenhanced exam. Post lens replacement otherwise orbital structures unremarkable. Opacification ethmoid sinus air cells. Small fluid level within the maxillary sinuses. Cervical medullary junction and pineal region unremarkable. MRA HEAD FINDINGS Exam is motion degraded. Moderate stenosis junction petrous and cervical segment right internal carotid artery. Mild moderate narrowing right internal carotid artery cavernous segment. Ectatic left internal carotid artery cavernous segment. Ectasia with areas of narrowing involving supraclinoid segment of the internal carotid artery bilaterally. Marked stenosis proximal A1 segment right anterior cerebral artery. A2 segment mild moderate irregularity narrowing bilaterally. Middle cerebral artery moderate branch vessel narrowing and irregularity bilaterally. Fetal type origin right posterior cerebral artery. Mild narrowing distal left vertebral artery. Mild narrowing portions of the posterior inferior cerebellar artery bilaterally. Mild narrowing mid aspect basilar artery. Nonvisualized anterior inferior cerebellar arteries. Moderate narrowing superior cerebellar arteries. Narrowing posterior cerebral artery distal branches. IMPRESSION: MRI HEAD Exam is motion degraded. Abnormal signal best seen on diffusion sequence involving medial aspect of the frontal and parietal lobes greater on the left. No surrounding FLAIR abnormality to suggest herpes encephalitis or limbic encephalitis. Findings probably related to seizure activity (and reversible) although similar findings can be seen in setting of episode of significant hypoglycemia. Suspect very small acute nonhemorrhagic  right corona radiata infarct. Remote small posterior right coronal radiata infarct. Marked confluent white matter changes have progressed since prior MR most consistent with result of small vessel disease. Global atrophy without hydrocephalus. Opacification ethmoid sinus air cells. Small fluid level within the maxillary sinuses. Cervical medullary junction and pineal region unremarkable. MRA HEAD Intracranial atherosclerotic type changes as detailed above predominantly involving branch vessels. Electronically Signed   By: Lacy Duverney M.D.   On: 06/27/2015 14:44   US Carotid Bilateral  06/27/2015  CLINICAL DATA:  Stroke symptoms, hypertension, hyperlipidemia and diabetes. EXAM: BILATERAL CAROTID DUPLEX ULTRASOUND TECHNIQUE: Wallace Cullens scale imaging, color Doppler and duplex ultrasound were performed of bilateral carotid and vertebral arteries in the neck. COMPARISON:  06/27/2015 FINDINGS: Criteria: Quantification of carotid stenosis is based on velocity parameters that correlate the residual internal carotid diameter with NASCET-based stenosis levels, using the diameter of the distal internal carotid lumen as the denominator for stenosis measurement. The following velocity measurements were obtained: RIGHT ICA:  69/22 cm/sec CCA:  62/15 cm/sec SYSTOLIC ICA/CCA RATIO:  1.1 DIASTOLIC ICA/CCA RATIO:  1.4 ECA:  63 cm/sec LEFT ICA:  90/35 cm/sec CCA:  97/27 cm/sec SYSTOLIC ICA/CCA RATIO:  0.9 DIASTOLIC ICA/CCA RATIO:  1.3 ECA:  115 cm/sec RIGHT CAROTID ARTERY: Minor atherosclerotic plaque formation. No hemodynamically significant right ICA stenosis, velocity elevation, or turbulent flow. Degree of narrowing less than 50%. RIGHT VERTEBRAL ARTERY:  Antegrade LEFT CAROTID ARTERY: Similar scattered minor atherosclerotic plaque formation. No hemodynamically significant left ICA stenosis, velocity elevation, or turbulent flow. LEFT VERTEBRAL ARTERY:  Antegrade IMPRESSION: Minor carotid atherosclerosis. No hemodynamically  significant ICA stenosis. Degree of narrowing less than 50% bilaterally. Patent antegrade vertebral flow bilaterally Electronically Signed   By: Judie Petit.  Shick M.D.   On: 06/27/2015 15:10   Mr Palma Holter  06/27/2015  CLINICAL DATA:  81 year old diabetic hypertensive female presents with seizures. Remote seizure from hypoglycemia. Currently being treated for urinary  tract infection. Subsequent encounter. EXAM: MRI HEAD WITHOUT CONTRAST MRA HEAD WITHOUT CONTRAST TECHNIQUE: Multiplanar, multiecho pulse sequences of the brain and surrounding structures were obtained without intravenous contrast. Angiographic images of the head were obtained using MRA technique without contrast. COMPARISON:  06/26/2015 head CT 07/10/2005 brain MR. FINDINGS: MRI HEAD FINDINGS Exam is motion degraded. Abnormal signal best seen on diffusion sequence involving medial aspect of the frontal and parietal lobes greater on the left. No surrounding FLAIR abnormality to suggest herpes encephalitis or limbic encephalitis. Findings probably related to seizure activity (and reversible) although similar findings can be seen in setting of episode of significant hypoglycemia Appearance not typical for hypertensive encephalopathy. Suspect very small acute nonhemorrhagic right corona radiata infarct. Remote small posterior right coronal radiata infarct. Marked confluent white matter changes have progressed since prior MR most consistent with result of small vessel disease. Global atrophy without hydrocephalus. No intracranial mass lesion noted on this unenhanced exam. Post lens replacement otherwise orbital structures unremarkable. Opacification ethmoid sinus air cells. Small fluid level within the maxillary sinuses. Cervical medullary junction and pineal region unremarkable. MRA HEAD FINDINGS Exam is motion degraded. Moderate stenosis junction petrous and cervical segment right internal carotid artery. Mild moderate narrowing right internal carotid artery  cavernous segment. Ectatic left internal carotid artery cavernous segment. Ectasia with areas of narrowing involving supraclinoid segment of the internal carotid artery bilaterally. Marked stenosis proximal A1 segment right anterior cerebral artery. A2 segment mild moderate irregularity narrowing bilaterally. Middle cerebral artery moderate branch vessel narrowing and irregularity bilaterally. Fetal type origin right posterior cerebral artery. Mild narrowing distal left vertebral artery. Mild narrowing portions of the posterior inferior cerebellar artery bilaterally. Mild narrowing mid aspect basilar artery. Nonvisualized anterior inferior cerebellar arteries. Moderate narrowing superior cerebellar arteries. Narrowing posterior cerebral artery distal branches. IMPRESSION: MRI HEAD Exam is motion degraded. Abnormal signal best seen on diffusion sequence involving medial aspect of the frontal and parietal lobes greater on the left. No surrounding FLAIR abnormality to suggest herpes encephalitis or limbic encephalitis. Findings probably related to seizure activity (and reversible) although similar findings can be seen in setting of episode of significant hypoglycemia. Suspect very small acute nonhemorrhagic right corona radiata infarct. Remote small posterior right coronal radiata infarct. Marked confluent white matter changes have progressed since prior MR most consistent with result of small vessel disease. Global atrophy without hydrocephalus. Opacification ethmoid sinus air cells. Small fluid level within the maxillary sinuses. Cervical medullary junction and pineal region unremarkable. MRA HEAD Intracranial atherosclerotic type changes as detailed above predominantly involving branch vessels. Electronically Signed   By: Lacy Duverney M.D.   On: 06/27/2015 14:44     ASSESSMENT AND PLAN:   81 year old female with a history of hypothyroidism and diabetes who presented with seizure and encephalopathy.  1.  Acute small infarct in the right corona radiata: Continue rectal aspirin as patient is not responsive. Continue neuro checks. Appreciate neurology consultation. Follow-up on echocardiogram. Carotid ultrasound without hemodynamically significant stenosis.   2. Seizures: Nursing staff is reporting probable seizures. EEG shows normal drowsy state versus postictal state. Continue IV Vimpat. Continue seizure precaution. Further recommendations as per neurology. Due to ongoing seizures will try fosphenytoin.   3. Acute encephalopathy: This is from seizures/postictal state and acute small infarct. Continue neuro checks. This is not near patient's baseline.   4. Diabetes type 2: Continue sliding scale insulin and Accu-Cheks. Patient is nothing by mouth.  5. Recent urinary tract infection: Pincus Sanes has been discontinued due to the fact that  he can lower seizure threshold and urine culture was negative.  6. Acute on chronic kidney disease stage IV: Avoid nephrotoxic agents. Continue IV fluids. Creatinine was some improvement. 7. Hypothyroidism: Check TSH.  Management plans discussed with the patient and she is in agreement.  CODE STATUS: FULL  CRITICAL CARE TOTAL TIME TAKING CARE OF THIS PATIENT: 35 minutes.  Patient still with AMS and seziures  POSSIBLE D/C ??, DEPENDING ON CLINICAL CONDITION.   Sherleen Pangborn M.D on 07/13/2015 at 11:47 AM  Between 7am to 6pm - Pager - [phone] After 6pm go to [email or web address] - password [email or web address] [facility]  [address] [facility]  Office  [phone]  CC: Primary care physician; Nelda Bucks., MD  Note: This dictation was prepared with Dragon dictation along with smaller phrase technology. Any transcriptional errors that result from this process are unintentional.

## 2015-06-29 NOTE — Procedures (Signed)
Intubation Procedure Note NAMIRA ROSEKRANS [id number] Apr 07, 1935  Procedure: Intubation Indications: Airway protection and maintenance  Procedure Details Consent: Unable to obtain consent because of emergent medical necessity. Time Out: Verified patient identification, verified procedure, site/side was marked, verified correct patient position, special equipment/implants available, medications/allergies/relevent history reviewed, required imaging and test results available.  Performed  Maximum sterile technique was used including gloves, hand hygiene and mask.  MAC    Evaluation Hemodynamic Status: BP stable throughout; O2 sats: stable throughout Patient's Current Condition: stable Complications: No apparent complications Patient did tolerate procedure well. Chest X-ray ordered to verify placement.  CXR: pending.   Koren Bound 07/21/2015

## 2015-06-29 NOTE — Progress Notes (Addendum)
Subjective: Recurrent seizure activity overnight.  Vimpat increased to  BID.  Despite this has continued to have seizure activity.  Patient has not regained full responsiveness since admission.  Seizure noted during examination with outstretched right arm, legs extending with tonic-clonic movements.    Objective: Current vital signs: BP 133/47 mmHg  Pulse 63  Temp(Src) 97.5 F (36.4 C) (Oral)  Resp 16  Ht  (1.549 m)  Wt 52.98 kg (116 lb 12.8 oz)  BMI 22.08 kg/m2  SpO2 99% Vital signs in last 24 hours: Temp:  [97.5 F (36.4 C)-98 F (36.7 C)] 97.5 F (36.4 C) (02/04 1133) Pulse Rate:  [59-72] 63 (02/04 1133) Resp:  [16-20] 16 (02/04 1133) BP: (112-157)/(47-74) 133/47 mmHg (02/04 1133) SpO2:  [95 %-99 %] 99 % (02/04 1133)  Intake/Output from previous day: 02/03 0701 - 02/04 0700 In: 1835 [I.V.:1800; IV Piggyback:35] Out: 0  Intake/Output this shift:   Nutritional status: Diet NPO time specified  Neurologic Exam: Mental Status: Obtunded. Nonverbal. Does not follow commands.Grimaces with deep sternal rub.    Lab Results: Basic Metabolic Panel:  Recent Labs Lab 06/24/15 1200 06/26/15 1917 06/27/15 0556 06/28/15 0552 06/26/2015 0524  NA 137 142 144 141 142  K 3.9 4.5 4.3 4.1 4.6  CL 110 117* 121* 117* 116*  CO2 22 17* 20* 18* 17*  GLUCOSE 258* 168* 112* 176* 148*  BUN 65* 62* 58* 60* 57*  CREATININE 3.27* 3.44* 2.99* 2.75* 2.62*  CALCIUM 9.2 8.6* 8.9 8.8* 8.9    Liver Function Tests: No results for input(s): AST, ALT, ALKPHOS, BILITOT, PROT, ALBUMIN in the last 168 hours. No results for input(s): LIPASE, AMYLASE in the last 168 hours. No results for input(s): AMMONIA in the last 168 hours.  CBC:  Recent Labs Lab 06/24/15 1200 06/26/15 1917 06/27/15 0556 06/28/15 0552  WBC 8.3 4.5 5.2 6.2  NEUTROABS 6.5  --   --   --   HGB 10.2* 9.0* 9.5* 9.1*  HCT 30.4* 27.6* 28.5* 27.3*  MCV 87.2 89.5 88.7 88.5  PLT 112* 100* 100* 106*    Cardiac  Enzymes:  Recent Labs Lab 06/27/15 0120 06/27/15 0556 06/27/15 1151 06/27/15 1449  CKTOTAL  --   --   --  66  TROPONINI 0.05* 0.05* 0.06*  --     Lipid Panel:  Recent Labs Lab 06/27/15 0556  CHOL 114  TRIG 121  HDL 32*  CHOLHDL 3.6  VLDL 24  LDLCALC 58    CBG:  Recent Labs Lab 06/28/15 2011 07/06/2015 0005 07/16/2015 0348 07/03/2015 0750 07/02/2015 1133  GLUCAP 143* 105* 135* 115* 136*    Microbiology: Results for orders placed or performed during the hospital encounter of 06/26/15  Urine culture     Status: None   Collection Time: 06/26/15  7:17 PM  Result Value Ref Range Status   Specimen Description URINE, RANDOM  Final   Special Requests NONE  Final   Culture MULTIPLE SPECIES PRESENT, SUGGEST RECOLLECTION  Final   Report Status 06/28/2015 FINAL  Final  Blood culture (routine x 2)     Status: None (Preliminary result)   Collection Time: 06/26/15  9:09 PM  Result Value Ref Range Status   Specimen Description BLOOD RIGHT ANTECUBITAL  Final   Special Requests BOTTLES DRAWN AEROBIC AND ANAEROBIC  Final   Culture NO GROWTH 3 DAYS  Final   Report Status PENDING  Incomplete  Blood culture (routine x 2)     Status: None (Preliminary result)  Collection Time: 06/26/15  9:09 PM  Result Value Ref Range Status   Specimen Description BLOOD LEFT HAND  Final   Special Requests BOTTLES DRAWN AEROBIC AND ANAEROBIC  Final   Culture NO GROWTH 3 DAYS  Final   Report Status PENDING  Incomplete  MRSA PCR Screening     Status: Abnormal   Collection Time: 06/27/15 12:40 AM  Result Value Ref Range Status   MRSA by PCR POSITIVE (A) NEGATIVE Final    Comment: CRITICAL RESULT CALLED TO, READ BACK BY AND VERIFIED WITH: ADRIAN WHITE ON 06/27/15 AT 0242 [facility]        The GeneXpert MRSA Assay (FDA approved for NASAL specimens only), is one component of a comprehensive MRSA colonization surveillance program. It is not intended to diagnose MRSA infection nor to guide or monitor  treatment for MRSA infections.     Coagulation Studies: No results for input(s): LABPROT, INR in the last 72 hours.  Imaging: Mr Sherrin Daisy Contrast  06/27/2015  CLINICAL DATA:  81 year old diabetic hypertensive female presents with seizures. Remote seizure from hypoglycemia. Currently being treated for urinary tract infection. Subsequent encounter. EXAM: MRI HEAD WITHOUT CONTRAST MRA HEAD WITHOUT CONTRAST TECHNIQUE: Multiplanar, multiecho pulse sequences of the brain and surrounding structures were obtained without intravenous contrast. Angiographic images of the head were obtained using MRA technique without contrast. COMPARISON:  06/26/2015 head CT 07/10/2005 brain MR. FINDINGS: MRI HEAD FINDINGS Exam is motion degraded. Abnormal signal best seen on diffusion sequence involving medial aspect of the frontal and parietal lobes greater on the left. No surrounding FLAIR abnormality to suggest herpes encephalitis or limbic encephalitis. Findings probably related to seizure activity (and reversible) although similar findings can be seen in setting of episode of significant hypoglycemia Appearance not typical for hypertensive encephalopathy. Suspect very small acute nonhemorrhagic right corona radiata infarct. Remote small posterior right coronal radiata infarct. Marked confluent white matter changes have progressed since prior MR most consistent with result of small vessel disease. Global atrophy without hydrocephalus. No intracranial mass lesion noted on this unenhanced exam. Post lens replacement otherwise orbital structures unremarkable. Opacification ethmoid sinus air cells. Small fluid level within the maxillary sinuses. Cervical medullary junction and pineal region unremarkable. MRA HEAD FINDINGS Exam is motion degraded. Moderate stenosis junction petrous and cervical segment right internal carotid artery. Mild moderate narrowing right internal carotid artery cavernous segment. Ectatic left internal  carotid artery cavernous segment. Ectasia with areas of narrowing involving supraclinoid segment of the internal carotid artery bilaterally. Marked stenosis proximal A1 segment right anterior cerebral artery. A2 segment mild moderate irregularity narrowing bilaterally. Middle cerebral artery moderate branch vessel narrowing and irregularity bilaterally. Fetal type origin right posterior cerebral artery. Mild narrowing distal left vertebral artery. Mild narrowing portions of the posterior inferior cerebellar artery bilaterally. Mild narrowing mid aspect basilar artery. Nonvisualized anterior inferior cerebellar arteries. Moderate narrowing superior cerebellar arteries. Narrowing posterior cerebral artery distal branches. IMPRESSION: MRI HEAD Exam is motion degraded. Abnormal signal best seen on diffusion sequence involving medial aspect of the frontal and parietal lobes greater on the left. No surrounding FLAIR abnormality to suggest herpes encephalitis or limbic encephalitis. Findings probably related to seizure activity (and reversible) although similar findings can be seen in setting of episode of significant hypoglycemia. Suspect very small acute nonhemorrhagic right corona radiata infarct. Remote small posterior right coronal radiata infarct. Marked confluent white matter changes have progressed since prior MR most consistent with result of small vessel disease. Global atrophy without hydrocephalus. Opacification ethmoid sinus  air cells. Small fluid level within the maxillary sinuses. Cervical medullary junction and pineal region unremarkable. MRA HEAD Intracranial atherosclerotic type changes as detailed above predominantly involving branch vessels. Electronically Signed   By: Lacy Duverney M.D.   On: 06/27/2015 14:44   US Carotid Bilateral  06/27/2015  CLINICAL DATA:  Stroke symptoms, hypertension, hyperlipidemia and diabetes. EXAM: BILATERAL CAROTID DUPLEX ULTRASOUND TECHNIQUE: Wallace Cullens scale imaging, color  Doppler and duplex ultrasound were performed of bilateral carotid and vertebral arteries in the neck. COMPARISON:  06/27/2015 FINDINGS: Criteria: Quantification of carotid stenosis is based on velocity parameters that correlate the residual internal carotid diameter with NASCET-based stenosis levels, using the diameter of the distal internal carotid lumen as the denominator for stenosis measurement. The following velocity measurements were obtained: RIGHT ICA:  69/22 cm/sec CCA:  62/15 cm/sec SYSTOLIC ICA/CCA RATIO:  1.1 DIASTOLIC ICA/CCA RATIO:  1.4 ECA:  63 cm/sec LEFT ICA:  90/35 cm/sec CCA:  97/27 cm/sec SYSTOLIC ICA/CCA RATIO:  0.9 DIASTOLIC ICA/CCA RATIO:  1.3 ECA:  115 cm/sec RIGHT CAROTID ARTERY: Minor atherosclerotic plaque formation. No hemodynamically significant right ICA stenosis, velocity elevation, or turbulent flow. Degree of narrowing less than 50%. RIGHT VERTEBRAL ARTERY:  Antegrade LEFT CAROTID ARTERY: Similar scattered minor atherosclerotic plaque formation. No hemodynamically significant left ICA stenosis, velocity elevation, or turbulent flow. LEFT VERTEBRAL ARTERY:  Antegrade IMPRESSION: Minor carotid atherosclerosis. No hemodynamically significant ICA stenosis. Degree of narrowing less than 50% bilaterally. Patent antegrade vertebral flow bilaterally Electronically Signed   By: Judie Petit.  Shick M.D.   On: 06/27/2015 15:10   Mr Palma Holter  06/27/2015  CLINICAL DATA:  81 year old diabetic hypertensive female presents with seizures. Remote seizure from hypoglycemia. Currently being treated for urinary tract infection. Subsequent encounter. EXAM: MRI HEAD WITHOUT CONTRAST MRA HEAD WITHOUT CONTRAST TECHNIQUE: Multiplanar, multiecho pulse sequences of the brain and surrounding structures were obtained without intravenous contrast. Angiographic images of the head were obtained using MRA technique without contrast. COMPARISON:  06/26/2015 head CT 07/10/2005 brain MR. FINDINGS: MRI HEAD FINDINGS Exam is  motion degraded. Abnormal signal best seen on diffusion sequence involving medial aspect of the frontal and parietal lobes greater on the left. No surrounding FLAIR abnormality to suggest herpes encephalitis or limbic encephalitis. Findings probably related to seizure activity (and reversible) although similar findings can be seen in setting of episode of significant hypoglycemia Appearance not typical for hypertensive encephalopathy. Suspect very small acute nonhemorrhagic right corona radiata infarct. Remote small posterior right coronal radiata infarct. Marked confluent white matter changes have progressed since prior MR most consistent with result of small vessel disease. Global atrophy without hydrocephalus. No intracranial mass lesion noted on this unenhanced exam. Post lens replacement otherwise orbital structures unremarkable. Opacification ethmoid sinus air cells. Small fluid level within the maxillary sinuses. Cervical medullary junction and pineal region unremarkable. MRA HEAD FINDINGS Exam is motion degraded. Moderate stenosis junction petrous and cervical segment right internal carotid artery. Mild moderate narrowing right internal carotid artery cavernous segment. Ectatic left internal carotid artery cavernous segment. Ectasia with areas of narrowing involving supraclinoid segment of the internal carotid artery bilaterally. Marked stenosis proximal A1 segment right anterior cerebral artery. A2 segment mild moderate irregularity narrowing bilaterally. Middle cerebral artery moderate branch vessel narrowing and irregularity bilaterally. Fetal type origin right posterior cerebral artery. Mild narrowing distal left vertebral artery. Mild narrowing portions of the posterior inferior cerebellar artery bilaterally. Mild narrowing mid aspect basilar artery. Nonvisualized anterior inferior cerebellar arteries. Moderate narrowing superior cerebellar arteries. Narrowing posterior cerebral artery  distal branches.  IMPRESSION: MRI HEAD Exam is motion degraded. Abnormal signal best seen on diffusion sequence involving medial aspect of the frontal and parietal lobes greater on the left. No surrounding FLAIR abnormality to suggest herpes encephalitis or limbic encephalitis. Findings probably related to seizure activity (and reversible) although similar findings can be seen in setting of episode of significant hypoglycemia. Suspect very small acute nonhemorrhagic right corona radiata infarct. Remote small posterior right coronal radiata infarct. Marked confluent white matter changes have progressed since prior MR most consistent with result of small vessel disease. Global atrophy without hydrocephalus. Opacification ethmoid sinus air cells. Small fluid level within the maxillary sinuses. Cervical medullary junction and pineal region unremarkable. MRA HEAD Intracranial atherosclerotic type changes as detailed above predominantly involving branch vessels. Electronically Signed   By: Lacy Duverney M.D.   On: 06/27/2015 14:44    Medications:  I have reviewed the patient's current medications. Scheduled: . antiseptic oral rinse  7 mL Mouth Rinse BID  . aspirin  300 mg Rectal Daily  . Chlorhexidine Gluconate Cloth  6 each Topical Q0600  . heparin  5,000 Units Subcutaneous 3 times per day  . insulin aspart  0-9 Units Subcutaneous 6 times per day  . lacosamide (VIMPAT) IV  200 mg Intravenous Q12H  . levothyroxine  75 mcg Intravenous Daily  . mupirocin ointment  1 application Nasal BID  . phenytoin (DILANTIN) IV  1,000 mg Intravenous Once  . sodium chloride flush  3 mL Intravenous Q12H    Assessment/Plan: Patient with frequent seizure noted.  Earlier today felt to be initiated by stimulation but now present without stimulation.  Patient on maximum Vimpat dose of  IV BID.    Recommendations: 1.  Ativan  IV now 2.  Dilantin  IV STAT.  Will start maintenance of  IV q8hours 3.  Case discussed with Dr.  Juliene Pina.  Patient to be transferred to the ICU 4.  Case discussed with CCM and T[facility] at [facility].  Patient to be transferred to step down at [facility] for continuous monitoring.   5.  Dilantin level one hour after infusion of load.   6.  CMET  This patient is critically ill and at significant risk of neurological worsening, death and care requires constant monitoring of vital signs, hemodynamics,respiratory and cardiac monitoring, neurological assessment, discussion with other specialists and medical decision making of high complexity. I spent 45 minutes of neurocritical care time  in the care of  this patient.   LOS: 3 days   Thana Farr, MD Neurology [phone] July 03, 2015  12:25 PM

## 2015-06-30 ENCOUNTER — Inpatient Hospital Stay (HOSPITAL_COMMUNITY): Payer: Medicare Other

## 2015-06-30 LAB — BLOOD GAS, ARTERIAL
Acid-base deficit: 12.9 mmol/L — ABNORMAL HIGH (ref 0.0–2.0)
Bicarbonate: 12.1 mEq/L — ABNORMAL LOW (ref 20.0–24.0)
DRAWN BY: 441661
FIO2: 0.4
LHR: 16 {breaths}/min
MECHVT: 380 mL
O2 Saturation: 99.2 %
PEEP: 5 cmH2O
PO2 ART: 159 mmHg — AB (ref 80.0–100.0)
Patient temperature: 98.6
TCO2: 12.8 mmol/L (ref 0–100)
pCO2 arterial: 24.4 mmHg — ABNORMAL LOW (ref 35.0–45.0)
pH, Arterial: 7.314 — ABNORMAL LOW (ref 7.350–7.450)

## 2015-06-30 LAB — CSF CELL COUNT WITH DIFFERENTIAL
RBC COUNT CSF: 670 /mm3 — AB
Tube #: 4
WBC CSF: 2 /mm3 (ref 0–5)

## 2015-06-30 LAB — BASIC METABOLIC PANEL
Anion gap: 15 (ref 5–15)
BUN: 59 mg/dL — AB (ref 6–20)
CO2: 13 mmol/L — ABNORMAL LOW (ref 22–32)
CREATININE: 2.81 mg/dL — AB (ref 0.44–1.00)
Calcium: 8.6 mg/dL — ABNORMAL LOW (ref 8.9–10.3)
Chloride: 111 mmol/L (ref 101–111)
GFR calc Af Amer: 17 mL/min — ABNORMAL LOW (ref >60)
GFR, EST NON AFRICAN AMERICAN: 15 mL/min — AB (ref >60)
Glucose, Bld: 147 mg/dL — ABNORMAL HIGH (ref 65–99)
POTASSIUM: 4.3 mmol/L (ref 3.5–5.1)
SODIUM: 139 mmol/L (ref 135–145)

## 2015-06-30 LAB — CBC
HCT: 27.8 % — ABNORMAL LOW (ref 36.0–46.0)
Hemoglobin: 9.6 g/dL — ABNORMAL LOW (ref 12.0–15.0)
MCH: 30 pg (ref 26.0–34.0)
MCHC: 34.5 g/dL (ref 30.0–36.0)
MCV: 86.9 fL (ref 78.0–100.0)
Platelets: 171 10*3/uL (ref 150–400)
RBC: 3.2 MIL/uL — ABNORMAL LOW (ref 3.87–5.11)
RDW: 13.5 % (ref 11.5–15.5)
WBC: 11.9 10*3/uL — ABNORMAL HIGH (ref 4.0–10.5)

## 2015-06-30 LAB — GLUCOSE, CAPILLARY
GLUCOSE-CAPILLARY: 126 mg/dL — AB (ref 65–99)
GLUCOSE-CAPILLARY: 150 mg/dL — AB (ref 65–99)
GLUCOSE-CAPILLARY: 156 mg/dL — AB (ref 65–99)
GLUCOSE-CAPILLARY: 115 mg/dL — AB (ref 65–99)
Glucose-Capillary: 149 mg/dL — ABNORMAL HIGH (ref 65–99)
Glucose-Capillary: 160 mg/dL — ABNORMAL HIGH (ref 65–99)
Glucose-Capillary: 206 mg/dL — ABNORMAL HIGH (ref 65–99)

## 2015-06-30 LAB — PHOSPHORUS: PHOSPHORUS: 5.3 mg/dL — AB (ref 2.5–4.6)

## 2015-06-30 LAB — MAGNESIUM: Magnesium: 1.9 mg/dL (ref 1.7–2.4)

## 2015-06-30 LAB — PROTEIN AND GLUCOSE, CSF
GLUCOSE CSF: 106 mg/dL — AB (ref 40–70)
Total  Protein, CSF: 36 mg/dL (ref 15–45)

## 2015-06-30 MED ORDER — VITAL HIGH PROTEIN PO LIQD
1000.0000 mL | ORAL | Status: DC
  Administered 2015-06-30: 1000 mL
  Administered 2015-06-30 (×2)

## 2015-06-30 NOTE — Progress Notes (Signed)
Brief Nutrition Note  Consult received for enteral/tube feeding initiation and management.  Adult Enteral Nutrition Protocol initiated. Full assessment to follow.  Admitting Dx: SEIZURES  Body mass index is 22.59 kg/(m^2). Pt meets criteria for normal range based on current BMI.  Labs:   Recent Labs Lab 06/28/15 0552 07/15/2015 0524 06/30/15 0500  NA 141 142 139  K 4.1 4.6 4.3  CL 117* 116* 111  CO2 18* 17* 13*  BUN 60* 57* 59*  CREATININE 2.75* 2.62* 2.81*  CALCIUM 8.8* 8.9 8.6*  MG  --   --  1.9  PHOS  --   --  5.3*  GLUCOSE 176* 148* 147*    Tilda Franco, MS, RD, LDN Pager: [phone] After Hours Pager: [phone]

## 2015-06-30 NOTE — Procedures (Signed)
History: 80 year old female with new onset seizures without return to baseline  Sedation: None at the beginning, and then propofol  Technique: This is a 21 channel continuous scalp EEG with video performed at the bedside with bipolar and monopolar montages arranged in accordance to the international 10/20 system of electrode placement. Recorded from 4:38 PM 2/4 until 7:30 AM 2/5. One channel was dedicated to EKG recording.    Background: At onset of recording, there is a periodic pattern in the left frontal region consisting of delta morphology with some associated fast activity at times. There is evolution in this pattern with a change in frequency from approximately 1 Hz, having runs that approach 2 Hz. There are brief periods of attenuation following these runs of increased frequency.  Photic stimulation: Physiologic driving is not performed  EEG Abnormalities: 1) lateralize periodic discharges in the left frontal region sometimes associated with fast activity with some evidence of evolution 2) burst suppression pattern following treatment with propofol after about 8 PM.  Clinical Interpretation: This EEG recorded lateralize periodic discharges in the left frontal region at times associated with fast activity(PLEDs-plus), in this clinical setting this could represent ongoing partial status epilepticus versus representing an area of cerebral injury.  Given the clinical correlate of spread involving motor activity of contralateral arm intermittently(not recorded on this EEG), I favor this to represent ongoing partial status epilepticus.  Ritta Slot, MD [facility] [phone]  If 7pm- 7am, please page neurology on call as listed in AMION.

## 2015-06-30 NOTE — Progress Notes (Signed)
PULMONARY / CRITICAL CARE MEDICINE   Name: Gina Andersen MRN: [id number] DOB: 10-15-1934    ADMISSION DATE:  2015-07-15 CONSULTATION DATE:  Jul 15, 2015  REFERRING MD:  TRH-MD  CHIEF COMPLAINT:  Status epilepticus  HISTORY OF PRESENT ILLNESS:   81 year old female who was treated for a UTI in the SNF who was admitted to [facility] for AMS and was noted to have a stroke and subsequently developed a seizures and was transferred to [facility] for burst suppression.  Patient was evaluated by neurology and they requested that we intubate the patient for burst suppression with propofol.  Upon evaluation, patient is not responsive and is not protecting her airway.  Will transfer to the ICU, intubate and mechanically ventilate.  SUBJECTIVE:  Unresponsive, sedated, no events overnight.  VITAL SIGNS: BP 109/42 mmHg  Pulse 54  Temp(Src) 96.3 F (35.7 C) (Core (Comment))  Resp 16  Ht  (1.549 m)  Wt 54.2 kg (119 lb 7.8 oz)  BMI 22.59 kg/m2  SpO2 100%  HEMODYNAMICS: CVP:  [7 mmHg-16 mmHg] 16 mmHg  VENTILATOR SETTINGS: Vent Mode:  [-] PRVC FiO2 (%):  [30 %-40 %] 30 % Set Rate:  [16 bmp] 16 bmp Vt Set:  [380 mL] 380 mL PEEP:  [5 cmH20] 5 cmH20 Plateau Pressure:  [12 cmH20-16 cmH20] 12 cmH20  INTAKE / OUTPUT: I/O last 3 completed shifts: In: 1561.4 [I.V.:1511.4; IV Piggyback:50] Out: 300 [Urine:300]  PHYSICAL EXAMINATION: General:  Chronically ill appearing female, sedated and intubated. Neuro:  Intact gag, extensor posturing on right and withdraw on left.  Does not follow command on propofol. HEENT:  [place]/AT, PERRL, EOM-I and MMM. Cardiovascular:  RRR, Nl S1/S2, -M/R/G. Lungs:  Coarse BS diffusely. Abdomen:  Soft, NT, ND and +BS. Musculoskeletal:  -edema and -tender. Skin:  Intact.  LABS:  BMET  Recent Labs Lab 06/28/15 0552 2015-07-15 0524 06/30/15 0500  NA 141 142 139  K 4.1 4.6 4.3  CL 117* 116* 111  CO2 18* 17* 13*  BUN 60* 57* 59*  CREATININE 2.75* 2.62* 2.81*  GLUCOSE  176* 148* 147*   Electrolytes  Recent Labs Lab 06/28/15 0552 July 15, 2015 0524 06/30/15 0500  CALCIUM 8.8* 8.9 8.6*  MG  --   --  1.9  PHOS  --   --  5.3*   CBC  Recent Labs Lab 06/27/15 0556 06/28/15 0552 06/30/15 0500  WBC 5.2 6.2 11.9*  HGB 9.5* 9.1* 9.6*  HCT 28.5* 27.3* 27.8*  PLT 100* 106* 171   Coag's No results for input(s): APTT, INR in the last 168 hours.  Sepsis Markers No results for input(s): LATICACIDVEN, PROCALCITON, O2SATVEN in the last 168 hours.  ABG  Recent Labs Lab 07-15-2015 2123 06/30/15 0345  PHART 7.353 7.314*  PCO2ART 22.8* 24.4*  PO2ART 135.0* 159*   Liver Enzymes  Recent Labs Lab 15-Jul-2015 1405 Jul 15, 2015 2102  AST 25 30  ALT 14 15  ALKPHOS 75 83  BILITOT 0.4 1.0  ALBUMIN 2.3* 2.3*   Cardiac Enzymes  Recent Labs Lab 06/27/15 0120 06/27/15 0556 06/27/15 1151  TROPONINI 0.05* 0.05* 0.06*   Glucose  Recent Labs Lab 07-15-2015 1133 07/15/2015 1806 07/15/15 1947 07/15/2015 2333 06/30/15 0316 06/30/15 0808  GLUCAP 136* 98 103* 185* 126* 150*   Imaging Dg Chest Port 1 View  06/30/2015  CLINICAL DATA:  Hx of ETT EXAM: PORTABLE CHEST 1 VIEW COMPARISON:  July 15, 2015 FINDINGS: Endotracheal tube is 1 cm from carina. Introduction of NG tube which extends to the stomach. No  change in LEFT central venous line. Normal cardiac silhouette. No pneumothorax. Mild basilar atelectasis. No edema or or pneumothorax. IMPRESSION: 1. Low endotracheal tube unchanged. 2. Mild basilar atelectasis. 3. NG tube in stomach. Electronically Signed   By: Genevive Bi M.D.   On: 06/30/2015 07:55   Dg Chest Port 1 View  06/28/2015  CLINICAL DATA:  Endotracheal tube and central line placement EXAM: PORTABLE CHEST 1 VIEW COMPARISON:  06/26/2015 FINDINGS: Endotracheal tube tip is 1.5 cm above the carina. Left internal jugular central line tip projects over the superior vena cava. There is no pneumothorax. Mild cardiac enlargement stable. Vascular pattern normal.  Lungs clear. IMPRESSION: Lines and tubes as described Electronically Signed   By: Esperanza Heir M.D.   On: 07/05/2015 19:17   Dg Abd Portable 1v  07/11/2015  CLINICAL DATA:  Patient status post enteric tube placement. EXAM: PORTABLE ABDOMEN - 1 VIEW COMPARISON:  CT 05/16/2015 FINDINGS: Enteric tube tip and side-port project over the stomach. Nonobstructed bowel gas pattern. Lumbar spine degenerative changes. Cholecystectomy clips. IMPRESSION: Enteric tube tip and side-port project over the stomach. Electronically Signed   By: Annia Belt M.D.   On: 07/04/2015 20:06   STUDIES:  Head CT with right sided CVA.  CULTURES: None  ANTIBIOTICS: None  SIGNIFICANT EVENTS: 2/4 VDRF for seizure and burst suppression.  LINES/TUBES: ETT 2/4>>> L IJ TLC 2/4  DISCUSSION: 81 year old female with PMH seizure will transfer to the ICU for intubation and burst suppression.  ASSESSMENT / PLAN:  PULMONARY A: VDRF for burst suppression. P:   - Full vent support. - Adjust vent for ABG. - F/U ABG and CXR in AM.  CARDIOVASCULAR A:  Drug induced hypotension P:  - Levophed for BP support. - Follow CVP. - Hydration at 100 ml/hr.  RENAL A:   Hyperchloremic metabolic acidosis. Acute on chronic renal failure. P:   - Hydrate. - BMET in AM. - Replace electrolytes as indicated.  GASTROINTESTINAL A:   No active issues. P:   - TF per nutrition. - Protonix.  HEMATOLOGIC A:   No active issues. P:  - Monitor.  INFECTIOUS A:   No active issues. P:   - Monitor fever and WBC.  ENDOCRINE A:   DM. P:   - CBGs. - ISS.  NEUROLOGIC A:   Seizure and CVA. P:   RASS goal: -5 - Propofol. - Continuous EEG. - Neurology following. - Continue burst suppression for one more day per neuro.  FAMILY  - Updates: No family bedside.  The patient is critically ill with multiple organ systems failure and requires high complexity decision making for assessment and support, frequent evaluation  and titration of therapies, application of advanced monitoring technologies and extensive interpretation of multiple databases.   Critical Care Time devoted to patient care services described in this note is  35  Minutes. This time reflects time of care of this signee Dr Koren Bound. This critical care time does not reflect procedure time, or teaching time or supervisory time of PA/NP/Med student/Med Resident etc but could involve care discussion time.  Alyson Reedy, M.D. [facility] Pulmonary/Critical Care Medicine. Pager: [phone]. After hours pager: [phone].  06/30/2015, 9:22 AM

## 2015-06-30 NOTE — Progress Notes (Signed)
Utilization review completed.  

## 2015-06-30 NOTE — Progress Notes (Signed)
Pt has a darkened area/purple and dark red on her sacrum that has been previously documented on as a stage II, there is a few minimal areas of thinning/skin tear that was noted on my assessement.  Darkened area/purple/dark red is concerning for deep tissue injury and RN will place a wound consult order for verification. I requested the other WTA RN on the unit to witness the wound area with me for additional consultation.  There is only one area in the wound about nickel size that is unblanchable at this time. The rest remains blanchable but could be a continuation from MASD on her buttocks/perineam/groin. The darkened area is most concerning and I feel like a wound consult would be beneficial for the patient's best outcome.  Gina Andersen

## 2015-06-30 NOTE — Progress Notes (Addendum)
Subjective: Continues in burst suppression.   Exam: Filed Vitals:   06/30/15 0730 06/30/15 0800  BP: 112/43 109/42  Pulse: 53 54  Temp: 96.3 F (35.7 C) 96.3 F (35.7 C)  Resp: 16 16   Gen: In bed, intubated Resp: ventilated Abd: soft, nt  Neuro:limited by burst suppression.  MS: does not open eyes or follow commands ZO:XWRUE Motor: does not withdraw Sensory:intact to LT  Pertinent Labs: Bmp - unremarkable  Impression: 81 year old female with new onset seizures without return to baseline in between seizures. This coupled with the pattern on EEG make me suspect that the pattern did represent ongoing seizure. Without another explanation for her obtundation, I  favored treating this aggressively. I discussed this with the daughter and proceeded with burst suppression.   Unclear etiology at this point, ? precipitated by ischemic infarct. Further workup needs to include CSF sampling.   Recommendations: 1) lumbar puncture for cells, protein, glucose, IgG index 2) continue dilantin, vimpat 3) I would favor at least a full 24 hours of burst suppression. I would favor beginning to wean propofol tomorrow morning.   This patient is critically ill and at significant risk of neurological worsening, death and care requires constant monitoring of vital signs, hemodynamics,respiratory and cardiac monitoring, neurological assessment, discussion with family, other specialists and medical decision making of high complexity. I spent 35 minutes of neurocritical care time  in the care of  this patient.  Ritta Slot, MD [facility] [phone]  If 7pm- 7am, please page neurology on call as listed in AMION. 06/30/2015  9:33 AM

## 2015-06-30 NOTE — Progress Notes (Signed)
ABG collected  

## 2015-06-30 NOTE — Progress Notes (Signed)
Pt urine output low. CVP 4.  MD aware and notified. No new orders given. Will continue to monitor.

## 2015-06-30 NOTE — Procedures (Signed)
Indication: Status epilepticus  Risks of the procedure were dicussed with the patient including post-LP headache, bleeding, infection, weakness/numbness of legs(radiculopathy), death.  The patient/patient's proxy agreed and written consent was obtained.   The patient was prepped and draped, and using sterile technique a 20 gauge quinke spinal needle was inserted in the L4-5 space. Bony resistance was repeatedly met therefore L3-4 space was attempted initially tinged fluid was obtained which quickly cleared. The opening pressure was 8 cm H2O. Approximately 8 cc of CSF were obtained and sent for analysis.

## 2015-07-01 ENCOUNTER — Inpatient Hospital Stay (HOSPITAL_COMMUNITY): Payer: Medicare Other

## 2015-07-01 LAB — CBC
HEMATOCRIT: 27.5 % — AB (ref 36.0–46.0)
HEMOGLOBIN: 9.4 g/dL — AB (ref 12.0–15.0)
MCH: 29.9 pg (ref 26.0–34.0)
MCHC: 34.2 g/dL (ref 30.0–36.0)
MCV: 87.6 fL (ref 78.0–100.0)
Platelets: 174 10*3/uL (ref 150–400)
RBC: 3.14 MIL/uL — ABNORMAL LOW (ref 3.87–5.11)
RDW: 14.1 % (ref 11.5–15.5)
WBC: 13.1 10*3/uL — ABNORMAL HIGH (ref 4.0–10.5)

## 2015-07-01 LAB — GLUCOSE, CAPILLARY
GLUCOSE-CAPILLARY: 258 mg/dL — AB (ref 65–99)
GLUCOSE-CAPILLARY: 290 mg/dL — AB (ref 65–99)
GLUCOSE-CAPILLARY: 260 mg/dL — AB (ref 65–99)
Glucose-Capillary: 194 mg/dL — ABNORMAL HIGH (ref 65–99)
Glucose-Capillary: 250 mg/dL — ABNORMAL HIGH (ref 65–99)
Glucose-Capillary: 230 mg/dL — ABNORMAL HIGH (ref 65–99)

## 2015-07-01 LAB — POCT I-STAT 3, ART BLOOD GAS (G3+)
ACID-BASE DEFICIT: 12 mmol/L — AB (ref 0.0–2.0)
BICARBONATE: 13 meq/L — AB (ref 20.0–24.0)
O2 SAT: 99 %
PO2 ART: 150 mmHg — AB (ref 80.0–100.0)
TCO2: 14 mmol/L (ref 0–100)
pCO2 arterial: 25.5 mmHg — ABNORMAL LOW (ref 35.0–45.0)
pH, Arterial: 7.31 — ABNORMAL LOW (ref 7.350–7.450)

## 2015-07-01 LAB — BASIC METABOLIC PANEL
Anion gap: 11 (ref 5–15)
BUN: 62 mg/dL — AB (ref 6–20)
CALCIUM: 8.1 mg/dL — AB (ref 8.9–10.3)
CHLORIDE: 111 mmol/L (ref 101–111)
CO2: 14 mmol/L — AB (ref 22–32)
CREATININE: 3.46 mg/dL — AB (ref 0.44–1.00)
GFR calc Af Amer: 14 mL/min — ABNORMAL LOW (ref >60)
GFR calc non Af Amer: 12 mL/min — ABNORMAL LOW (ref >60)
Glucose, Bld: 252 mg/dL — ABNORMAL HIGH (ref 65–99)
Potassium: 4.7 mmol/L (ref 3.5–5.1)
SODIUM: 136 mmol/L (ref 135–145)

## 2015-07-01 LAB — PHOSPHORUS: Phosphorus: 6.4 mg/dL — ABNORMAL HIGH (ref 2.5–4.6)

## 2015-07-01 LAB — CSF IGG: IGG CSF: 7.5 mg/dL (ref 0.0–8.6)

## 2015-07-01 LAB — MAGNESIUM: Magnesium: 1.8 mg/dL (ref 1.7–2.4)

## 2015-07-01 MED ORDER — VITAL AF 1.2 CAL PO LIQD
1000.0000 mL | ORAL | Status: DC
  Administered 2015-07-01 – 2015-07-09 (×6): 1000 mL
  Filled 2015-07-01: qty 1000

## 2015-07-01 MED ORDER — INSULIN GLARGINE 100 UNIT/ML ~~LOC~~ SOLN
5.0000 [IU] | Freq: Two times a day (BID) | SUBCUTANEOUS | Status: DC
  Administered 2015-07-01 – 2015-07-05 (×10): 5 [IU] via SUBCUTANEOUS
  Filled 2015-07-01 (×12): qty 0.05

## 2015-07-01 MED ORDER — CHLORHEXIDINE GLUCONATE CLOTH 2 % EX PADS
6.0000 | MEDICATED_PAD | Freq: Every day | CUTANEOUS | Status: AC
  Administered 2015-07-02 – 2015-07-06 (×5): 6 via TOPICAL

## 2015-07-01 NOTE — Progress Notes (Signed)
Initial Nutrition Assessment  INTERVENTION:  -d/c Vital High Protein  -Start Vital AF 1.2 @ 40 mL/hr, provides 1152 kcal, 72 g of protein, 779 mL of water  NUTRITION DIAGNOSIS:   Increased nutrient needs related to wound healing as evidenced by estimated needs.  GOAL:   Patient will meet greater than or equal to 90% of their needs  MONITOR:   TF tolerance, Weight trends, Vent status  REASON FOR ASSESSMENT:   Consult Enteral/tube feeding initiation and management  ASSESSMENT:   Pt UTI in the SNF who was admitted to [place] for AMS and was noted to have a stroke and subsequently developed a seizures and was transferred to [facility] for burst suppression.  2/4 Intubated Mv 6.5 Temp 36.2-pt on bair hugger (used 37 for calculations)  Pt started at high rate propofol; per RN rounds pt will d/c propofol today (2/5). Current rate is 16.2 mL/hr, provides 428 kcal/day.   Conducted nutrition physical focused exam. Identified severe muscle wasting in calf related to being wheel chair bound. No other muscle or fat wasting identified.   Medications reviewed. Labs reviewed; Phosphorus 6.4 mg/dL, CBG [phone].  Diet Order:  Diet NPO time specified  Skin:   (MASD on goin, buttock and perineum and deep tissue injury)  Last BM:  Jul 16, 2015  Height:   Ht Readings from Last 1 Encounters:  07/16/2015  (1.549 m)    Weight:   Wt Readings from Last 1 Encounters:  07/01/15 126 lb 8.7 oz (57.4 kg)    Ideal Body Weight:  47.7 kg  BMI:  Body mass index is 23.92 kg/(m^2).  Estimated Nutritional Needs:   Kcal:  1125  Protein:  70-80 grams  Fluid:  1.5 L   EDUCATION NEEDS:   No education needs identified at this time  [place], Dietetic Intern Pager: [phone]

## 2015-07-01 NOTE — Progress Notes (Signed)
Subjective: Continues in burst suppression.   Exam: Filed Vitals:   07/01/15 0742 07/01/15 0800  BP: 115/41 118/42  Pulse: 64 64  Temp: 96.4 F (35.8 C) 96.4 F (35.8 C)  Resp: 16 16   Gen: In bed, intubated Resp: ventilated Abd: soft, nt  Neuro:limited by burst suppression.  MS: does not open eyes or follow commands ZO:XWRUE Motor: does not withdraw Sensory:intact to LT  Pertinent Labs: Bmp - unremarkable  Impression: 81 year old female with new onset seizures without return to baseline in between seizures. This coupled with the pattern on EEG make me suspect that the pattern did represent ongoing seizure. Was treated with burst suppression.   Unclear etiology at this point, ? precipitated by ischemic infarct. CSF results unremarkable.   Recommendations: 1) continue dilantin, vimpat 2) Goal to wean off propofol today. Will continue to monitor closely 3)Continue LTM EEG for now    Elspeth Cho, DO [facility] [phone]  If 7pm- 7am, please page neurology on call as listed in AMION.  If 7pm- 7am, please page neurology on call as listed in AMION. 07/01/2015  9:12 AM

## 2015-07-01 NOTE — Consult Note (Signed)
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  Foley in place at this time.  Wound type: Pressure injury and IAD Pressure Ulcer POA: Yes Measurement: 2 cm x 2 cm intact maroon discoloration to sacrum.  Erythema to periwound Generalized erythema to perineal region, some denuded areas.  Foley in place and skin is clean and dry at this time.   Patient is wheelchair bound at SNF, incontinent and wears briefs.  Wound bed: Intact maroon to sacrum Denuded skin with pink moist wound bed to perineum, foley in place Drainage (amount, consistency, odor) Scant serous to perineum Periwound:Blanchable erythema Dressing procedure/placement/frequency:Cleanse sacral wound with soap and water and pat gently dry.  Apply silicone border foam prophylactic dressing.  Turn and reposition every 2 hours to offload, redistribute pressure.  Keep perineal breakdown clean and dry.  Skin barrier cream every shift and PRN soilage.  Will not follow at this time.  Please re-consult if needed.  Maple Hudson RN BSN CWON Pager [phone]

## 2015-07-01 NOTE — Progress Notes (Signed)
Inpatient Diabetes Program Recommendations  AACE/ADA: New Consensus Statement on Inpatient Glycemic Control (2015)  Target Ranges:  Prepandial:   less than 140 mg/dL      Peak postprandial:   less than 180 mg/dL (1-2 hours)      Critically ill patients:  140 - 180 mg/dL   Results for REECE, MCBROOM (MRN [id number]) as of 07/01/2015 10:03  Ref. Range 06/30/2015 08:52 06/30/2015 11:21 06/30/2015 16:08 06/30/2015 19:57 06/30/2015 23:35 07/01/2015 03:57 07/01/2015 08:33  Glucose-Capillary Latest Ref Range: 65-99 mg/dL 782 (H) 956 (H) 213 (H) 206 (H) 149 (H) 194 (H) 258 (H)   Review of Glycemic Control  Diabetes history: DM 2 Outpatient Diabetes medications: Levemir 55 units QAM, 8 units QPM, Humalog 14 units breakfast and lunch Current orders for Inpatient glycemic control: Novolog Moderate Q4hrs  Inpatient Diabetes Program Recommendations: Insulin - Tube Feed Coverage: Vital HP Tube feeds started and increased to 40/hr @ 2100 last night. Glucose has increased into the 200's this am. Please consider starting Tube Feed Coverage, Novolog 3 units Q4hrs.  Thanks,  Christena Deem RN, MSN, [facility] Inpatient Diabetes Coordinator Team Pager [phone] (8a-5p)

## 2015-07-01 NOTE — Progress Notes (Signed)
PULMONARY / CRITICAL CARE MEDICINE   Name: Gina Andersen MRN: [id number] DOB: December 06, 1934    ADMISSION DATE:  07-14-15 CONSULTATION DATE:  Jul 14, 2015  REFERRING MD:  TRH-MD  CHIEF COMPLAINT:  Status epilepticus  HISTORY OF PRESENT ILLNESS:   81 year old female who was treated for a UTI in the SNF who was admitted to [place] for AMS and was noted to have a stroke and subsequently developed a seizures and was transferred to [facility] for burst suppression.  Patient was evaluated by neurology and they requested that we intubate the patient for burst suppression with propofol.  Upon evaluation, patient is not responsive and is not protecting her airway.  Will transfer to the ICU, intubate and mechanically ventilate.  SUBJECTIVE:  Unresponsive, sedated, no events overnight.  VITAL SIGNS: BP 118/42 mmHg  Pulse 64  Temp(Src) 96.4 F (35.8 C) (Core (Comment))  Resp 16  Ht  (1.549 m)  Wt 57.4 kg (126 lb 8.7 oz)  BMI 23.92 kg/m2  SpO2 100%  HEMODYNAMICS: CVP:  [1 mmHg-13 mmHg] 7 mmHg  VENTILATOR SETTINGS: Vent Mode:  [-] PRVC FiO2 (%):  [30 %] 30 % Set Rate:  [16 bmp] 16 bmp Vt Set:  [380 mL] 380 mL PEEP:  [5 cmH20] 5 cmH20 Plateau Pressure:  [12 cmH20-16 cmH20] 15 cmH20  INTAKE / OUTPUT: I/O last 3 completed shifts: In: 5796 [I.V.:5000.3; NG/GT:650.7; IV Piggyback:145] Out: 491 [Urine:491]  PHYSICAL EXAMINATION: General:  Chronically ill appearing female, sedated and intubated. Neuro:  Intact gag, extensor posturing on right and withdraw on left.  Does not follow command on propofol. HEENT:  [place]/AT, PERRL, EOM-I and MMM. Cardiovascular:  RRR, Nl S1/S2, -M/R/G. Lungs:  Coarse BS diffusely. Abdomen:  Soft, NT, ND and +BS. Musculoskeletal:  -edema and -tender. Skin:  Intact.  LABS:  BMET  Recent Labs Lab 07/14/15 0524 06/30/15 0500 07/01/15 0530  NA 142 139 136  K 4.6 4.3 4.7  CL 116* 111 111  CO2 17* 13* 14*  BUN 57* 59* 62*  CREATININE 2.62* 2.81* 3.46*   GLUCOSE 148* 147* 252*   Electrolytes  Recent Labs Lab 07/14/15 0524 06/30/15 0500 07/01/15 0530  CALCIUM 8.9 8.6* 8.1*  MG  --  1.9 1.8  PHOS  --  5.3* 6.4*   CBC  Recent Labs Lab 06/28/15 0552 06/30/15 0500 07/01/15 0530  WBC 6.2 11.9* 13.1*  HGB 9.1* 9.6* 9.4*  HCT 27.3* 27.8* 27.5*  PLT 106* 171 174   Coag's No results for input(s): APTT, INR in the last 168 hours.  Sepsis Markers No results for input(s): LATICACIDVEN, PROCALCITON, O2SATVEN in the last 168 hours.  ABG  Recent Labs Lab 07-14-15 2123 06/30/15 0345 07/01/15 0508  PHART 7.353 7.314* 7.310*  PCO2ART 22.8* 24.4* 25.5*  PO2ART 135.0* 159* 150.0*   Liver Enzymes  Recent Labs Lab 2015/07/14 1405 14-Jul-2015 2102  AST 25 30  ALT 14 15  ALKPHOS 75 83  BILITOT 0.4 1.0  ALBUMIN 2.3* 2.3*   Cardiac Enzymes  Recent Labs Lab 06/27/15 0120 06/27/15 0556 06/27/15 1151  TROPONINI 0.05* 0.05* 0.06*   Glucose  Recent Labs Lab 06/30/15 0852 06/30/15 1121 06/30/15 1608 06/30/15 1957 06/30/15 2335 07/01/15 0357  GLUCAP 156* 115* 160* 206* 149* 194*   Imaging Dg Chest Port 1 View  07/01/2015  CLINICAL DATA:  Respiratory failure, intubated patient, seizure activity, CVA. EXAM: PORTABLE CHEST 1 VIEW COMPARISON:  Portable chest x-ray of June 30, 2015 FINDINGS: The lungs are adequately inflated. Increased  density at the left lung base with partial obscuration of the hemidiaphragm is noted. The right lung is clear. The heart and pulmonary vascularity are normal. The endotracheal tube tip lies 3.3 cm above the carina. The esophagogastric tube tip projects below the inferior margin of the image. The left internal jugular venous catheter tip projects over the proximal portion of the SVC. IMPRESSION: Interval development of left lower lobe atelectasis or pneumonia. A trace left pleural effusion is suspected. The support tubes are in reasonable position. Electronically Signed   By: David  [place] M.D.    On: 07/01/2015 07:31   STUDIES:  Head CT with right sided CVA.  CULTURES: None  ANTIBIOTICS: None  SIGNIFICANT EVENTS: 2/4 VDRF for seizure and burst suppression.  LINES/TUBES: ETT 2/4>>> L IJ TLC 2/4>>>  DISCUSSION: 81 year old female with PMH seizure will transfer to the ICU for intubation and burst suppression.  ASSESSMENT / PLAN:  PULMONARY A: VDRF for burst suppression. P:   - Continue full vent support. - Adjust vent for ABG. - F/U ABG and CXR in AM.  CARDIOVASCULAR A:  Drug induced hypotension P:  - Levophed for BP support currently at 21. - Follow CVP currently 8. - Hydration at 100 ml/hr.  RENAL A:   Hyperchloremic metabolic acidosis. Acute on chronic renal failure. P:   - Hydrate NS 100 ml/hr. - BMET in AM. - Replace electrolytes as indicated.  GASTROINTESTINAL A:   No active issues. P:   - TF per nutrition. - Protonix.  HEMATOLOGIC A:   No active issues. P:  - Monitor.  INFECTIOUS A:   No active issues. P:   - Monitor fever and WBC.  ENDOCRINE A:   DM. P:   - CBGs. - ISS.  NEUROLOGIC A:   Seizure and CVA. P:   RASS goal: -5 - Propofol decrease to off by the end of the day. - Continuous EEG. - Neurology following. - Continue burst suppression for one more day per neuro.  FAMILY  - Updates: No family bedside.  The patient is critically ill with multiple organ systems failure and requires high complexity decision making for assessment and support, frequent evaluation and titration of therapies, application of advanced monitoring technologies and extensive interpretation of multiple databases.   Critical Care Time devoted to patient care services described in this note is  35  Minutes. This time reflects time of care of this signee Dr Koren Bound. This critical care time does not reflect procedure time, or teaching time or supervisory time of PA/NP/Med student/Med Resident etc but could involve care discussion  time.  Alyson Reedy, M.D. [facility] Pulmonary/Critical Care Medicine. Pager: [phone]. After hours pager: [phone].  07/01/2015, 8:46 AM

## 2015-07-02 ENCOUNTER — Inpatient Hospital Stay (HOSPITAL_COMMUNITY): Payer: Medicare Other

## 2015-07-02 LAB — BLOOD GAS, ARTERIAL
Acid-base deficit: 13.6 mmol/L — ABNORMAL HIGH (ref 0.0–2.0)
Bicarbonate: 12.3 mEq/L — ABNORMAL LOW (ref 20.0–24.0)
DRAWN BY: 44956
FIO2: 0.3
O2 SAT: 98.1 %
PATIENT TEMPERATURE: 98.6
PCO2 ART: 29.7 mmHg — AB (ref 35.0–45.0)
PEEP: 5 cmH2O
PH ART: 7.241 — AB (ref 7.350–7.450)
RATE: 16 resp/min
TCO2: 13.2 mmol/L (ref 0–100)
VT: 380 mL
pO2, Arterial: 114 mmHg — ABNORMAL HIGH (ref 80.0–100.0)

## 2015-07-02 LAB — BASIC METABOLIC PANEL
ANION GAP: 12 (ref 5–15)
BUN: 65 mg/dL — AB (ref 6–20)
CALCIUM: 7.8 mg/dL — AB (ref 8.9–10.3)
CHLORIDE: 112 mmol/L — AB (ref 101–111)
CO2: 13 mmol/L — ABNORMAL LOW (ref 22–32)
CREATININE: 3.44 mg/dL — AB (ref 0.44–1.00)
GFR calc Af Amer: 14 mL/min — ABNORMAL LOW (ref >60)
GFR, EST NON AFRICAN AMERICAN: 12 mL/min — AB (ref >60)
GLUCOSE: 188 mg/dL — AB (ref 65–99)
POTASSIUM: 4.3 mmol/L (ref 3.5–5.1)
SODIUM: 137 mmol/L (ref 135–145)

## 2015-07-02 LAB — TRIGLYCERIDES: TRIGLYCERIDES: 123 mg/dL (ref <150)

## 2015-07-02 LAB — GLUCOSE, CAPILLARY
GLUCOSE-CAPILLARY: 158 mg/dL — AB (ref 65–99)
GLUCOSE-CAPILLARY: 154 mg/dL — AB (ref 65–99)
Glucose-Capillary: 158 mg/dL — ABNORMAL HIGH (ref 65–99)
Glucose-Capillary: 197 mg/dL — ABNORMAL HIGH (ref 65–99)
Glucose-Capillary: 235 mg/dL — ABNORMAL HIGH (ref 65–99)
Glucose-Capillary: 268 mg/dL — ABNORMAL HIGH (ref 65–99)

## 2015-07-02 LAB — CULTURE, BLOOD (ROUTINE X 2)
CULTURE: NO GROWTH
CULTURE: NO GROWTH

## 2015-07-02 LAB — CBC
HCT: 26.8 % — ABNORMAL LOW (ref 36.0–46.0)
HEMOGLOBIN: 8.8 g/dL — AB (ref 12.0–15.0)
MCH: 28.5 pg (ref 26.0–34.0)
MCHC: 32.8 g/dL (ref 30.0–36.0)
MCV: 86.7 fL (ref 78.0–100.0)
PLATELETS: 158 10*3/uL (ref 150–400)
RBC: 3.09 MIL/uL — AB (ref 3.87–5.11)
RDW: 14.3 % (ref 11.5–15.5)
WBC: 12.7 10*3/uL — AB (ref 4.0–10.5)

## 2015-07-02 LAB — MAGNESIUM: Magnesium: 1.7 mg/dL (ref 1.7–2.4)

## 2015-07-02 LAB — PHOSPHORUS: Phosphorus: 5.4 mg/dL — ABNORMAL HIGH (ref 2.5–4.6)

## 2015-07-02 MED ORDER — FREE WATER
250.0000 mL | Freq: Four times a day (QID) | Status: DC
  Administered 2015-07-02 – 2015-07-04 (×8): 250 mL

## 2015-07-02 NOTE — Progress Notes (Signed)
Family requested MD update today. Daughter's number given to Dr. Hosie Poisson.

## 2015-07-02 NOTE — Progress Notes (Signed)
Subjective: No overnight events. Tapered off propofol on 2/06.    Exam: Filed Vitals:   07/02/15 0600 07/02/15 0630  BP: 135/41 142/49  Pulse: 72 73  Temp: 97.3 F (36.3 C) 97.3 F (36.3 C)  Resp: 18 17   Gen: In bed, intubated Resp: ventilated Abd: soft, nt  Neuro MS: does not open eyes or follow commands ON:GEXBM Motor: does not withdraw Sensory:intact to LT  Pertinent Labs: Bmp - unremarkable  Impression: 81 year old female with new onset seizures without return to baseline in between seizures. This coupled with the pattern on EEG make me suspect that the pattern did represent ongoing seizure. Was treated with burst suppression.   Unclear etiology at this point, ? precipitated by ischemic infarct. CSF results unremarkable.   Recommendations: 1) continue dilantin, vimpat 2) Weaned off sedation 3) Will discontinue LTM EEG 4) Will continue to monitor for improvement in mental status off all sedation 5) Check dilantin and albumin level for tomorrow morning    Elspeth Cho, DO [facility] [phone]  If 7pm- 7am, please page neurology on call as listed in AMION.  If 7pm- 7am, please page neurology on call as listed in AMION. 07/02/2015  7:33 AM

## 2015-07-02 NOTE — Progress Notes (Signed)
Pulled ET tube out 4 cm from 27 to 23 cm.  Xray showed right main stem placement.

## 2015-07-02 NOTE — Progress Notes (Signed)
LTM taken down per Dr. Hosie Poisson. No skin breakdown seen. Results pending

## 2015-07-02 NOTE — Progress Notes (Signed)
PULMONARY / CRITICAL CARE MEDICINE   Name: Gina Andersen MRN: [id number] DOB: 01-18-1935    ADMISSION DATE:  07/12/2015 CONSULTATION DATE:  July 10, 2015  REFERRING MD:  TRH-MD  CHIEF COMPLAINT:  Status epilepticus  HISTORY OF PRESENT ILLNESS:   81 year old female who was treated for a UTI in the SNF who was admitted to [place] for AMS and was noted to have a stroke and subsequently developed a seizures and was transferred to [facility] for burst suppression.  Patient was evaluated by neurology and they requested that we intubate the patient for burst suppression with propofol.  Upon evaluation, patient is not responsive and is not protecting her airway.  Will transfer to the ICU, intubate and mechanically ventilate.  SUBJECTIVE:  Off sedation since 4 PM yesterday and not arousable.  VITAL SIGNS: BP 136/48 mmHg  Pulse 72  Temp(Src) 96.4 F (35.8 C) (Core (Comment))  Resp 20  Ht  (1.549 m)  Wt 61.7 kg (136 lb 0.4 oz)  BMI 25.71 kg/m2  SpO2 100%  HEMODYNAMICS: CVP:  [2 mmHg-18 mmHg] 18 mmHg  VENTILATOR SETTINGS: Vent Mode:  [-] PRVC FiO2 (%):  [30 %] 30 % Set Rate:  [16 bmp] 16 bmp Vt Set:  [380 mL] 380 mL PEEP:  [5 cmH20] 5 cmH20 Plateau Pressure:  [15 cmH20-17 cmH20] 17 cmH20  INTAKE / OUTPUT: I/O last 3 completed shifts: In: 6486.4 [I.V.:4889.7; NG/GT:1456.7; IV Piggyback:140] Out: 530 [Urine:530]  PHYSICAL EXAMINATION: General:  Chronically ill appearing female, off sedation, withdraws only to pain in the lower ext. Neuro:  Intact gag, withdraws both lower ext to pain. HEENT:  [place]/AT, PERRL, EOM-I and MMM. Cardiovascular:  RRR, Nl S1/S2, -M/R/G. Lungs:  Coarse BS diffusely. Abdomen:  Soft, NT, ND and +BS. Musculoskeletal:  -edema and -tender. Skin:  Intact.  LABS:  BMET  Recent Labs Lab 06/30/15 0500 07/01/15 0530 07/02/15 0358  NA 139 136 137  K 4.3 4.7 4.3  CL 111 111 112*  CO2 13* 14* 13*  BUN 59* 62* 65*  CREATININE 2.81* 3.46* 3.44*  GLUCOSE 147*  252* 188*   Electrolytes  Recent Labs Lab 06/30/15 0500 07/01/15 0530 07/02/15 0358  CALCIUM 8.6* 8.1* 7.8*  MG 1.9 1.8 1.7  PHOS 5.3* 6.4* 5.4*   CBC  Recent Labs Lab 06/30/15 0500 07/01/15 0530 07/02/15 0358  WBC 11.9* 13.1* 12.7*  HGB 9.6* 9.4* 8.8*  HCT 27.8* 27.5* 26.8*  PLT 171 174 158   Coag's No results for input(s): APTT, INR in the last 168 hours.  Sepsis Markers No results for input(s): LATICACIDVEN, PROCALCITON, O2SATVEN in the last 168 hours.  ABG  Recent Labs Lab 06/30/15 0345 07/01/15 0508 07/02/15 0421  PHART 7.314* 7.310* 7.241*  PCO2ART 24.4* 25.5* 29.7*  PO2ART 159* 150.0* 114*   Liver Enzymes  Recent Labs Lab 07/23/2015 1405 07/11/2015 2102  AST 25 30  ALT 14 15  ALKPHOS 75 83  BILITOT 0.4 1.0  ALBUMIN 2.3* 2.3*   Cardiac Enzymes  Recent Labs Lab 06/27/15 0120 06/27/15 0556 06/27/15 1151  TROPONINI 0.05* 0.05* 0.06*   Glucose  Recent Labs Lab 07/01/15 1156 07/01/15 1555 07/01/15 2006 07/01/15 2325 07/02/15 0353 07/02/15 0833  GLUCAP 250* 230* 290* 260* 158* 158*   Imaging Dg Chest Port 1 View  07/02/2015  CLINICAL DATA:  Intubation. EXAM: PORTABLE CHEST 1 VIEW COMPARISON:  07/01/2015. FINDINGS: Endotracheal tube tip noted in the orifice of the right mainstem bronchus. Proximal repositioning of 4 cm suggested. NG tube  noted with its tip below the left hemidiaphragm. Left IJ line noted in stable position. Mediastinum hilar structures normal. Low lung volumes with basilar atelectasis and/or infiltrates. Small bilateral pleural effusions. No pneumothorax. IMPRESSION: 1. Endotracheal tube tip noted in the orifice of the right mainstem bronchus. Proximal repositioning of approximately 4 cm suggested. NG tube and left IJ line in good anatomic position . 2. Low lung volumes with basilar atelectasis and/or infiltrates. Small bilateral pleural effusions . Critical Value/emergent results were called by telephone at the time of  interpretation on 07/02/2015 at 7:25 am to nurse [facility] , who verbally acknowledged these results. Electronically Signed   By: Maisie Fus  Register   On: 07/02/2015 07:27   STUDIES:  Head CT with right sided CVA.  CULTURES: None  ANTIBIOTICS: None  SIGNIFICANT EVENTS: 2/4 VDRF for seizure and burst suppression.  LINES/TUBES: ETT 2/4>>> L IJ TLC 2/4>>>  DISCUSSION: 81 year old female with PMH seizure will transfer to the ICU for intubation and burst suppression.  ASSESSMENT / PLAN:  PULMONARY A: VDRF for burst suppression. P:   - PS as tolerated. - Need to have a family meeting to determine plan of care later this week. - Adjust vent for ABG. - F/U ABG and CXR in AM.  CARDIOVASCULAR A:  Drug induced hypotension P:  - Levophed for BP support currently at 21. - Follow CVP.  RENAL A:   Hyperchloremic metabolic acidosis. Acute on chronic renal failure. Hyperchloremic metabolic acidosis. P:   - BMET in AM. - Replace electrolytes as indicated. - Free water 250 q6 to address hyperchloremia. - KVO IVF.  GASTROINTESTINAL A:   No active issues. P:   - TF per nutrition. - Protonix.  HEMATOLOGIC A:   No active issues. P:  - Monitor.  INFECTIOUS A:   No active issues. P:   - Monitor fever and WBC.  ENDOCRINE A:   DM. P:   - CBGs. - ISS.  NEUROLOGIC A:   Seizure and CVA. P:   - D/C all sedation. - Continuous EEG discontinued per neuro. - Neurology following.  FAMILY  - Updates: No family bedside.  The patient is critically ill with multiple organ systems failure and requires high complexity decision making for assessment and support, frequent evaluation and titration of therapies, application of advanced monitoring technologies and extensive interpretation of multiple databases.   Critical Care Time devoted to patient care services described in this note is  35  Minutes. This time reflects time of care of this signee Dr Koren Bound. This critical  care time does not reflect procedure time, or teaching time or supervisory time of PA/NP/Med student/Med Resident etc but could involve care discussion time.  Alyson Reedy, M.D. [facility] Pulmonary/Critical Care Medicine. Pager: [phone]. After hours pager: [phone].  07/02/2015, 11:34 AM

## 2015-07-03 ENCOUNTER — Inpatient Hospital Stay (HOSPITAL_COMMUNITY): Payer: Medicare Other

## 2015-07-03 DIAGNOSIS — I6320 Cerebral infarction due to unspecified occlusion or stenosis of unspecified precerebral arteries: Secondary | ICD-10-CM

## 2015-07-03 LAB — BLOOD GAS, ARTERIAL
ACID-BASE DEFICIT: 14 mmol/L — AB (ref 0.0–2.0)
Bicarbonate: 11.8 mEq/L — ABNORMAL LOW (ref 20.0–24.0)
Drawn by: 441661
FIO2: 0.3
LHR: 16 {breaths}/min
MECHVT: 380 mL
O2 Saturation: 98.1 %
PEEP/CPAP: 5 cmH2O
PO2 ART: 108 mmHg — AB (ref 80.0–100.0)
Patient temperature: 97.3
TCO2: 12.6 mmol/L (ref 0–100)
pCO2 arterial: 26.7 mmHg — ABNORMAL LOW (ref 35.0–45.0)
pH, Arterial: 7.264 — ABNORMAL LOW (ref 7.350–7.450)

## 2015-07-03 LAB — BASIC METABOLIC PANEL
Anion gap: 10 (ref 5–15)
BUN: 70 mg/dL — AB (ref 6–20)
CHLORIDE: 112 mmol/L — AB (ref 101–111)
CO2: 13 mmol/L — AB (ref 22–32)
CREATININE: 3.49 mg/dL — AB (ref 0.44–1.00)
Calcium: 7.7 mg/dL — ABNORMAL LOW (ref 8.9–10.3)
GFR calc non Af Amer: 12 mL/min — ABNORMAL LOW (ref >60)
GFR, EST AFRICAN AMERICAN: 13 mL/min — AB (ref >60)
Glucose, Bld: 332 mg/dL — ABNORMAL HIGH (ref 65–99)
Potassium: 4.8 mmol/L (ref 3.5–5.1)
Sodium: 135 mmol/L (ref 135–145)

## 2015-07-03 LAB — PHENYTOIN LEVEL, TOTAL: Phenytoin Lvl: 17.6 ug/mL (ref 10.0–20.0)

## 2015-07-03 LAB — CBC
HEMATOCRIT: 25.4 % — AB (ref 36.0–46.0)
HEMOGLOBIN: 8.3 g/dL — AB (ref 12.0–15.0)
MCH: 28.6 pg (ref 26.0–34.0)
MCHC: 32.7 g/dL (ref 30.0–36.0)
MCV: 87.6 fL (ref 78.0–100.0)
Platelets: 143 10*3/uL — ABNORMAL LOW (ref 150–400)
RBC: 2.9 MIL/uL — ABNORMAL LOW (ref 3.87–5.11)
RDW: 14.7 % (ref 11.5–15.5)
WBC: 10.1 10*3/uL (ref 4.0–10.5)

## 2015-07-03 LAB — GLUCOSE, CAPILLARY
GLUCOSE-CAPILLARY: 296 mg/dL — AB (ref 65–99)
GLUCOSE-CAPILLARY: 244 mg/dL — AB (ref 65–99)
Glucose-Capillary: 328 mg/dL — ABNORMAL HIGH (ref 65–99)
Glucose-Capillary: 288 mg/dL — ABNORMAL HIGH (ref 65–99)
Glucose-Capillary: 224 mg/dL — ABNORMAL HIGH (ref 65–99)

## 2015-07-03 LAB — MAGNESIUM: Magnesium: 1.7 mg/dL (ref 1.7–2.4)

## 2015-07-03 LAB — OLIGOCLONAL BANDS, CSF + SERM

## 2015-07-03 LAB — ALBUMIN: Albumin: 1.8 g/dL — ABNORMAL LOW (ref 3.5–5.0)

## 2015-07-03 LAB — PHOSPHORUS: PHOSPHORUS: 5.4 mg/dL — AB (ref 2.5–4.6)

## 2015-07-03 NOTE — Procedures (Signed)
Continuous Video-EEG Monitoring Report   Patient: Gina Andersen, Gina Andersen     EEG No.ID: [id number]     DOB: 1935-03-09 Age: 81 Room#3M04  MED REC NO: [id number] Gender: Female   TECH: Varney Baas     Physician: Ronnie Doss     Referring Physician: Ritta Slot Time: 7:30:40 AM   Report Date: 07/02/2015 Recorded Time: 1439.5 min.    Study Duration:07/01/2015 07:30 to 07/02/2015 07:30 CPT [id number] Diagnosis:R56.9  History: This is a 81 year old female presenting with new onset seizures without returning to mental status baseline. Video-EEG monitoring was performed to evaluate for seizures and guide treatment.  Technical Details: Long-term video-EEG monitoring was performed using standard setting per the guidelines. Briefly, a minimum of 21 electrodes were placed on scalp according to the International 10-20 or/and 10-10 Systems. Supplemental electrodes were placed as needed. Single EKG electrode was also used to detect cardiac arrhythmia. Patient's behavior was continuously recorded on video simultaneously with EEG. A minimum of 16 channels were used for data display. Each epoch of study was reviewed manually daily and as needed using standard referential and bipolar montages.   EEG Description: The background was discontinuous, consisting of periods of generalized amplitude suppression (<10 uV) and generalized bursts of polymorphic delta activity admixed with beta activity. Overall the suppression phase constituted > 70% of the record, consistent with burst suppression pattern. No posterior dominant rhythm or sleep architecture was present. No epileptiform discharges or seizures were in evidence.   Impression: This is an abnormal EEG due to the presence of burst suppression, suggesting severe encephalopathy, but medication effect (propofol) could not be excluded.Compared with the last study, current  study showed no significant change. .    Reading Physician: Ronnie Doss, MD, PhD

## 2015-07-03 NOTE — Procedures (Signed)
Continuous Video-EEG Monitoring Report   Patient: Gina Andersen, Gina Andersen     EEG No.ID: [id number]     DOB: December 13, 1934 Age: 81 Room#3M04  MED REC NO: [id number] Gender: Female   TECH: Varney Baas     Physician: Ronnie Doss     Referring Physician: Ritta Slot Time: 7:30:40 AM   Report Date: 07/03/2015 Recorded Time: 83.5 min.    Study Duration:07/02/2015 07:30 to 07/02/2015 08:53 CPT [id number] Diagnosis:R56.9  History: This is a 81 year old female presenting with new onset seizures without returning to mental status baseline. Video-EEG monitoring was performed to evaluate for seizures and guide treatment.  Technical Details: Long-term video-EEG monitoring was performed using standard setting per the guidelines. Briefly, a minimum of 21 electrodes were placed on scalp according to the International 10-20 or/and 10-10 Systems. Supplemental electrodes were placed as needed. Single EKG electrode was also used to detect cardiac arrhythmia. Patient's behavior was continuously recorded on video simultaneously with EEG. A minimum of 16 channels were used for data display. Each epoch of study was reviewed manually daily and as needed using standard referential and bipolar montages.   EEG Description: The background was discontinuous, consisting of periods of generalized amplitude suppression (<10 uV) and generalized bursts of polymorphic delta activity admixed with beta activity. Overall the suppression phase constituted > 70% of the record, consistent with burst suppression pattern. No posterior dominant rhythm or sleep architecture was present. No epileptiform discharges or seizures were in evidence.   Impression: This is an abnormal EEG due to the presence of burst suppression, suggesting severe encephalopathy, but medication effect (propofol) could not be  excluded.Compared with the last study, current study showed no significant change. .    Reading Physician: Ronnie Doss, MD, PhD

## 2015-07-03 NOTE — Progress Notes (Signed)
Inpatient Diabetes Program Recommendations  AACE/ADA: New Consensus Statement on Inpatient Glycemic Control (2015)  Target Ranges:  Prepandial:   less than 140 mg/dL      Peak postprandial:   less than 180 mg/dL (1-2 hours)      Critically ill patients:  140 - 180 mg/dL   Results for DEAUNA, YAW (MRN [id number]) as of 07/03/2015 09:21  Ref. Range 07/02/2015 08:33 07/02/2015 11:25 07/02/2015 15:48 07/02/2015 19:55 07/02/2015 23:33 07/03/2015 03:17 07/03/2015 08:15  Glucose-Capillary Latest Ref Range: 65-99 mg/dL 132 (H) 440 (H) 102 (H) 235 (H) 268 (H) 328 (H) 288 (H)   Review of Glycemic Control  Diabetes history: DM 2 Outpatient Diabetes medications: Levemir 55 units QAM, 8 units QPM, Humalog 14 units breakfast and lunch Current orders for Inpatient glycemic control: Novolog Moderate Q4hrs  Inpatient Diabetes Program Recommendations: Insulin - Tube Feed Coverage: Glucose increased into the 200's, currently on Vital HP tube feeds 40/hr. Consider starting Tube Feed Coverage Novolog 4 units Q4hrs.  Thanks,  Christena Deem RN, MSN, [facility] Inpatient Diabetes Coordinator Team Pager [phone] (8a-5p)

## 2015-07-03 NOTE — Progress Notes (Signed)
abg collected  

## 2015-07-03 NOTE — Progress Notes (Signed)
PULMONARY / CRITICAL CARE MEDICINE   Name: ANY MCNEICE MRN: [id number] DOB: [birth year]    ADMISSION DATE:  07/19/2015 CONSULTATION DATE:  07/13/2015  REFERRING MD:  TRH-MD  CHIEF COMPLAINT:  Status epilepticus  HISTORY OF PRESENT ILLNESS:   81 year old female who was treated for a UTI in the SNF who was admitted to [place] for AMS and was noted to have a stroke and subsequently developed a seizures and was transferred to [facility] for burst suppression.  Patient was evaluated by neurology and they requested that we intubate the patient for burst suppression with propofol.  Upon evaluation, patient is not responsive and is not protecting her airway.  Will transfer to the ICU, intubate and mechanically ventilate.  SUBJECTIVE:  Off sedation and completely unresponsive, does not withdraw to pain.  VITAL SIGNS: BP 104/44 mmHg  Pulse 57  Temp(Src) 97.5 F (36.4 C) (Core (Comment))  Resp 19  Ht  (1.549 m)  Wt 62.6 kg (138 lb 0.1 oz)  BMI 26.09 kg/m2  SpO2 99%  HEMODYNAMICS: CVP:  [9 mmHg-18 mmHg] 13 mmHg  VENTILATOR SETTINGS: Vent Mode:  [-] PSV;CPAP FiO2 (%):  [30 %-40 %] 30 % Set Rate:  [16 bmp] 16 bmp Vt Set:  [380 mL] 380 mL PEEP:  [5 cmH20] 5 cmH20 Pressure Support:  [5 cmH20-8 cmH20] 5 cmH20 Plateau Pressure:  [12 cmH20-13 cmH20] 13 cmH20  INTAKE / OUTPUT: I/O last 3 completed shifts: In: 4676.6 [I.V.:2361.6; NG/GT:2180; IV Piggyback:135] Out: 855 [Urine:855]  PHYSICAL EXAMINATION: General:  Chronically ill appearing female, off sedation, not withdrawing to pain. Neuro:  Intact gag, withdraws both lower ext to pain. HEENT:  [place]/AT, PERRL, EOM-I and MMM. Cardiovascular:  RRR, Nl S1/S2, -M/R/G. Lungs:  Coarse BS diffusely. Abdomen:  Soft, NT, ND and +BS. Musculoskeletal:  -edema and -tender. Skin:  Intact.  LABS:  BMET  Recent Labs Lab 07/01/15 0530 07/02/15 0358 07/03/15 0520  NA 136 137 135  K 4.7 4.3 4.8  CL 111 112* 112*  CO2 14* 13* 13*  BUN 62*  65* 70*  CREATININE 3.46* 3.44* 3.49*  GLUCOSE 252* 188* 332*   Electrolytes  Recent Labs Lab 07/01/15 0530 07/02/15 0358 07/03/15 0520  CALCIUM 8.1* 7.8* 7.7*  MG 1.8 1.7 1.7  PHOS 6.4* 5.4* 5.4*   CBC  Recent Labs Lab 07/01/15 0530 07/02/15 0358 07/03/15 0520  WBC 13.1* 12.7* 10.1  HGB 9.4* 8.8* 8.3*  HCT 27.5* 26.8* 25.4*  PLT 174 158 143*   Coag's No results for input(s): APTT, INR in the last 168 hours.  Sepsis Markers No results for input(s): LATICACIDVEN, PROCALCITON, O2SATVEN in the last 168 hours.  ABG  Recent Labs Lab 07/01/15 0508 07/02/15 0421 07/03/15 0400  PHART 7.310* 7.241* 7.264*  PCO2ART 25.5* 29.7* 26.7*  PO2ART 150.0* 114* 108*   Liver Enzymes  Recent Labs Lab 07/03/2015 1405 07/05/2015 2102 07/03/15 0520  AST 25 30  --   ALT 14 15  --   ALKPHOS 75 83  --   BILITOT 0.4 1.0  --   ALBUMIN 2.3* 2.3* 1.8*   Cardiac Enzymes  Recent Labs Lab 06/27/15 0120 06/27/15 0556 06/27/15 1151  TROPONINI 0.05* 0.05* 0.06*   Glucose  Recent Labs Lab 07/02/15 1125 07/02/15 1548 07/02/15 1955 07/02/15 2333 07/03/15 0317 07/03/15 0815  GLUCAP 154* 197* 235* 268* 328* 288*   Imaging Dg Chest Port 1 View  07/03/2015  CLINICAL DATA:  Intubated patient EXAM: PORTABLE CHEST 1 VIEW COMPARISON:  07/02/2015 FINDINGS: Endotracheal tube 2.8 cm from carina. LEFT central venous line and NG tube unchanged. Stable cardiac silhouette there is LEFT basilar atelectasis and small effusion. IMPRESSION: Repositioning of endotracheal tube in good position. Electronically Signed   By: Genevive Bi M.D.   On: 07/03/2015 07:15   STUDIES:  Head CT with right sided CVA.  CULTURES: None  ANTIBIOTICS: None  SIGNIFICANT EVENTS: 2/4 VDRF for seizure and burst suppression.  LINES/TUBES: ETT 2/4>>> L IJ TLC 2/4>>>  DISCUSSION: 81 year old female with PMH seizure will transfer to the ICU for intubation and burst suppression.  ASSESSMENT /  PLAN:  PULMONARY A: VDRF for burst suppression. P:   - PS as tolerated, no extubation given mental status. - Need to have a family meeting to determine plan of care, neurology spoke with one of the daughters and they understand that she will not survive this. - Adjust vent for ABG. - F/U ABG and CXR in AM.  CARDIOVASCULAR A:  Drug induced hypotension P:  - Levophed for BP support currently at 21. - Follow CVP.  RENAL A:   Hyperchloremic metabolic acidosis. Acute on chronic renal failure. Hyperchloremic metabolic acidosis. P:   - BMET in AM. - Replace electrolytes as indicated. - Free water 250 q6 to address hyperchloremia. - KVO IVF.  GASTROINTESTINAL A:   No active issues. P:   - TF per nutrition. - Protonix.  HEMATOLOGIC A:   No active issues. P:  - Monitor.  INFECTIOUS A:   No active issues. P:   - Monitor fever and WBC.  ENDOCRINE A:   DM. P:   - CBGs. - ISS.  NEUROLOGIC A:   Seizure and CVA. P:   - D/C all sedation. - Would recommend withdrawal at this point. - Neurology following.  FAMILY  - Updates: No family bedside but neurology spoke with daughter.  The patient is critically ill with multiple organ systems failure and requires high complexity decision making for assessment and support, frequent evaluation and titration of therapies, application of advanced monitoring technologies and extensive interpretation of multiple databases.   Critical Care Time devoted to patient care services described in this note is  35  Minutes. This time reflects time of care of this signee Dr Koren Bound. This critical care time does not reflect procedure time, or teaching time or supervisory time of PA/NP/Med student/Med Resident etc but could involve care discussion time.  Alyson Reedy, M.D. [facility] Pulmonary/Critical Care Medicine. Pager: [phone]. After hours pager: [phone].  07/03/2015, 11:01 AM

## 2015-07-03 NOTE — Progress Notes (Signed)
Pt U/O still low. 10-15cc q2hrs. CVP 5. MD okay with U/O if blood pressure goals are met.

## 2015-07-03 NOTE — Progress Notes (Signed)
Daughter, Claud Kelp, contacted by RN via telephone per MD request for family meeting. Daughter stated that she would contact the other siblings and try to come to hospital for meeting at 13:00 tomorrow 07/04/15. Raynelle Fanning stated that they would contact staff if unable to come. She is aware of the severity of the patient's situation and stated "mama has suffered enough". Raynelle Fanning also stated that she had legal guardianship and would bring the paperwork.

## 2015-07-03 NOTE — Procedures (Signed)
Continuous Video-EEG Monitoring Report   Patient: Gina Andersen, Gina Andersen     EEG No.ID: [id number]     DOB: 08-26-1934 Age: 81 Room#3M04  MED REC NO: [id number] Gender: Female   TECH: Varney Baas     Physician: Ronnie Doss     Referring Physician: Ritta Slot Time: 7:30:40 AM   Report Date: 07/01/2015 Recorded Time: 1439.5 min.    Study Duration: 06/30/2015 07:30 to 07/01/2015 07:30 CPT Code:  21308 Diagnosis:  R56.9  History: This is a 81 year old female presenting with new onset seizures without returning to mental status baseline.  Video-EEG monitoring was performed to evaluate for seizures and guide treatment.  Technical Details:  Long-term video-EEG monitoring was performed using standard setting per the guidelines.  Briefly, a minimum of 21 electrodes were placed on scalp according to the International 10-20 or/and 10-10 Systems.  Supplemental electrodes were placed as needed.  Single EKG electrode was also used to detect cardiac arrhythmia.  Patient's behavior was continuously recorded on video simultaneously with EEG.  A minimum of 16 channels were used for data display.  Each epoch of study was reviewed manually daily and as needed using standard referential and bipolar montages.    EEG Description:  The background was discontinuous, consisting of periods of generalized amplitude suppression (<10 uV) and generalized bursts of polymorphic delta activity admixed with beta activity.  Overall the suppression phase constituted > 70% of the record, consistent with burst suppression pattern.  No posterior dominant rhythm or sleep architecture was present.  No epileptiform discharges or seizures were in evidence.   Impression:  This is an abnormal EEG due to the presence of burst suppression, suggesting severe encephalopathy, but medication effect (propofol) could not be excluded.               .    Reading Physician: Ronnie Doss, MD, PhD

## 2015-07-03 NOTE — Progress Notes (Signed)
Subjective: No overnight events. Remains off sedation. Corrected dilantin level is supratherapeutic  Exam: Filed Vitals:   07/03/15 0630 07/03/15 0700  BP: 138/43 131/42  Pulse: 69 69  Temp: 96.4 F (35.8 C) 96.3 F (35.7 C)  Resp: 24 21   Gen: In bed, intubated Resp: ventilated Abd: soft, nt  Neuro MS: does not open eyes or follow commands [id number] Motor: minimal withdrawal bilateral LE Sensory: mild grimace with noxious stimuli  Pertinent Labs: Bmp - unremarkable  Impression: 81 year old female with new onset seizures without return to baseline in between seizures. This coupled with the pattern on EEG make me suspect that the pattern did represent ongoing seizure. Was treated with burst suppression.   Unclear etiology at this point, ? precipitated by ischemic infarct. CSF results unremarkable. Her corrected dilantin level is markedly supra-therapeutic, question if this is contributing to her depressed mental state  Recommendations: 1) continue vimpat 2) Weaned off sedation 3) Will discontinue LTM EEG 4)Hold dilantin. Will repeat in the morning    Elspeth Cho, DO [facility] [phone]  If 7pm- 7am, please page neurology on call as listed in AMION.  If 7pm- 7am, please page neurology on call as listed in AMION. 07/03/2015  7:46 AM

## 2015-07-04 ENCOUNTER — Inpatient Hospital Stay (HOSPITAL_COMMUNITY): Payer: Medicare Other

## 2015-07-04 LAB — BLOOD GAS, ARTERIAL
Acid-base deficit: 13.8 mmol/L — ABNORMAL HIGH (ref 0.0–2.0)
BICARBONATE: 11.8 meq/L — AB (ref 20.0–24.0)
Drawn by: 418751
FIO2: 0.3
LHR: 16 {breaths}/min
O2 SAT: 98.2 %
PATIENT TEMPERATURE: 98.6
PCO2 ART: 26.6 mmHg — AB (ref 35.0–45.0)
PEEP/CPAP: 5 cmH2O
PH ART: 7.269 — AB (ref 7.350–7.450)
PO2 ART: 116 mmHg — AB (ref 80.0–100.0)
TCO2: 12.6 mmol/L (ref 0–100)
VT: 380 mL

## 2015-07-04 LAB — BASIC METABOLIC PANEL
ANION GAP: 13 (ref 5–15)
BUN: 79 mg/dL — ABNORMAL HIGH (ref 6–20)
CHLORIDE: 107 mmol/L (ref 101–111)
CO2: 13 mmol/L — AB (ref 22–32)
Calcium: 8 mg/dL — ABNORMAL LOW (ref 8.9–10.3)
Creatinine, Ser: 3.46 mg/dL — ABNORMAL HIGH (ref 0.44–1.00)
GFR calc non Af Amer: 12 mL/min — ABNORMAL LOW (ref >60)
GFR, EST AFRICAN AMERICAN: 14 mL/min — AB (ref >60)
Glucose, Bld: 284 mg/dL — ABNORMAL HIGH (ref 65–99)
Potassium: 5.1 mmol/L (ref 3.5–5.1)
Sodium: 133 mmol/L — ABNORMAL LOW (ref 135–145)

## 2015-07-04 LAB — GLUCOSE, CAPILLARY
GLUCOSE-CAPILLARY: 239 mg/dL — AB (ref 65–99)
GLUCOSE-CAPILLARY: 337 mg/dL — AB (ref 65–99)
Glucose-Capillary: 242 mg/dL — ABNORMAL HIGH (ref 65–99)
Glucose-Capillary: 237 mg/dL — ABNORMAL HIGH (ref 65–99)
Glucose-Capillary: 300 mg/dL — ABNORMAL HIGH (ref 65–99)
Glucose-Capillary: 396 mg/dL — ABNORMAL HIGH (ref 65–99)

## 2015-07-04 LAB — CBC
HEMATOCRIT: 25.6 % — AB (ref 36.0–46.0)
Hemoglobin: 8.3 g/dL — ABNORMAL LOW (ref 12.0–15.0)
MCH: 28.6 pg (ref 26.0–34.0)
MCHC: 32.4 g/dL (ref 30.0–36.0)
MCV: 88.3 fL (ref 78.0–100.0)
PLATELETS: 167 10*3/uL (ref 150–400)
RBC: 2.9 MIL/uL — ABNORMAL LOW (ref 3.87–5.11)
RDW: 14.8 % (ref 11.5–15.5)
WBC: 11.6 10*3/uL — AB (ref 4.0–10.5)

## 2015-07-04 LAB — HERPES SIMPLEX VIRUS(HSV) DNA BY PCR
HSV 1 DNA: NEGATIVE
HSV 2 DNA: NEGATIVE

## 2015-07-04 LAB — MAGNESIUM: Magnesium: 1.7 mg/dL (ref 1.7–2.4)

## 2015-07-04 LAB — PHOSPHORUS: Phosphorus: 5.2 mg/dL — ABNORMAL HIGH (ref 2.5–4.6)

## 2015-07-04 MED ORDER — PANTOPRAZOLE SODIUM 40 MG PO PACK
40.0000 mg | PACK | ORAL | Status: DC
  Administered 2015-07-04 – 2015-07-10 (×7): 40 mg
  Filled 2015-07-04 (×8): qty 20

## 2015-07-04 MED ORDER — FENTANYL CITRATE (PF) 100 MCG/2ML IJ SOLN
25.0000 ug | INTRAMUSCULAR | Status: DC | PRN
  Administered 2015-07-05 (×2): 50 ug via INTRAVENOUS
  Filled 2015-07-04 (×2): qty 2

## 2015-07-04 MED ORDER — ACETAMINOPHEN 160 MG/5ML PO SOLN
650.0000 mg | Freq: Four times a day (QID) | ORAL | Status: DC | PRN
  Filled 2015-07-04: qty 20.3

## 2015-07-04 MED ORDER — ASPIRIN 325 MG PO TABS
325.0000 mg | ORAL_TABLET | Freq: Every day | ORAL | Status: DC
  Administered 2015-07-04 – 2015-07-10 (×7): 325 mg
  Filled 2015-07-04 (×7): qty 1

## 2015-07-04 MED ORDER — LEVOTHYROXINE SODIUM 75 MCG PO TABS
150.0000 ug | ORAL_TABLET | Freq: Every day | ORAL | Status: DC
  Administered 2015-07-04 – 2015-07-10 (×7): 150 ug via ORAL
  Filled 2015-07-04 (×7): qty 2

## 2015-07-04 MED ORDER — ANTISEPTIC ORAL RINSE SOLUTION (CORINZ)
7.0000 mL | Freq: Four times a day (QID) | OROMUCOSAL | Status: DC
  Administered 2015-07-04 – 2015-07-06 (×7): 7 mL via OROMUCOSAL

## 2015-07-04 NOTE — Progress Notes (Signed)
Nutrition Follow-up  INTERVENTION:  -Increase Vital AF 1.2 to 50 mL/hr, provides 1440 kcal (meets 100% of needs), 90 grams of protein, and 1013 mL of water    NUTRITION DIAGNOSIS:   Increased nutrient needs related to wound healing as evidenced by estimated needs. -Ongoing   GOAL:   Patient will meet greater than or equal to 90% of their needs -Progressing  MONITOR:   TF tolerance, Weight trends, Vent status  ASSESSMENT:   Pt UTI in the SNF who was admitted to [place] for AMS and was noted to have a stroke and subsequently developed a seizures and was transferred to [facility] for burst suppression.  2/6- off sedation  Per MD no improvement in neuro status family meeting scheduled for pm.   Pt weight has increased by 21 lb since admission, noted pt +13 L with bilateral LE edema.  MV- 10.2 Temp- 37  Pt discussed at rounds.  Medications reviewed. Labs reviewed; Sodium 133, Phosphorus 5.2  Diet Order:  Diet NPO time specified  Skin:   (MASD on goin, buttock and perineum and deep tissue injury)  Last BM:  06/27/2015  Height:   Ht Readings from Last 1 Encounters:  07/23/2015  (1.549 m)    Weight:   Wt Readings from Last 1 Encounters:  07/04/15 140 lb 14 oz (63.9 kg)    Ideal Body Weight:  47.7 kg  BMI:  Body mass index is 26.63 kg/(m^2).  Estimated Nutritional Needs:   Kcal:  1438  Protein:  80-90  Fluid:  1.5 L  EDUCATION NEEDS:   No education needs identified at this time  [place], Dietetic Intern Pager: [phone]

## 2015-07-04 NOTE — Progress Notes (Signed)
Subjective: No overnight events. Remains off sedation.   Exam: Filed Vitals:   07/04/15 0700 07/04/15 0722  BP: 149/44 149/44  Pulse: 66 59  Temp: 98.6 F (37 C)   Resp: 24 28   Gen: In bed, intubated Resp: ventilated Abd: soft, nt  Neuro MS: does not open eyes or follow commands [id number] Motor: wiggles toes to noxious stimuli but no true withdrawal Sensory: no grimace with noxious stimuli  Pertinent Labs: Bmp - unremarkable  Impression: 81 year old female with new onset seizures without return to baseline in between seizures. This coupled with the pattern on EEG make me suspect that the pattern did represent ongoing seizure. Was treated with burst suppression.   Unclear etiology at this point, ? precipitated by ischemic infarct. CSF results unremarkable. Her corrected dilantin level is markedly supra-therapeutic, question if this is contributing to her depressed mental state.   Off sedation for multiple days with no noted improvement, guarded prognosis for meaningful recovery at this time.   Recommendations: 1) continue vimpat 2) Weaned off sedation 3) Will plan for family meeting to discuss goals of care    Elspeth Cho, DO [facility] [phone]  If 7pm- 7am, please page neurology on call as listed in AMION.   07/04/2015  7:45 AM

## 2015-07-04 NOTE — Progress Notes (Addendum)
Pt has put out 75 cc of urine thru the shift. Notified MD of puss in urine earlier this am during rounds. Irrigated the foley with 10cc at this time. Bladder scanned pt and had on bladder. Notified nurse at [facility]. Awaiting MD to call back  1845- Night Shift nurse arrived and stated that this has been an ongoing issue. So contacted [facility] nurse to make her aware that this was not a new problem.

## 2015-07-04 NOTE — Progress Notes (Signed)
eLink Physician-Brief Progress Note Patient Name: Gina Andersen DOB: 01/20/1935 MRN: [id number]   Date of Service  07/04/2015  HPI/Events of Note  RN contacted regarding tachypnea on ventilator. Possible air hunger. Patient now DNR & eventual terminal extubation if mental status does not improve.  eICU Interventions  1. Hold on PRN Ativan at this time 2. Fentanyl IV prn for air hunger or breakthrough pain     Intervention Category Intermediate Interventions: Other:  Lawanda Cousins 07/04/2015, 11:29 PM

## 2015-07-04 NOTE — Progress Notes (Signed)
PULMONARY / CRITICAL CARE MEDICINE   Name: MYSTERY SCHRUPP MRN: [id number] DOB: [birth year]    ADMISSION DATE:  06/30/2015 CONSULTATION DATE:  07/20/2015  REFERRING MD:  TRH-MD  CHIEF COMPLAINT:  Status epilepticus  SUBJECTIVE:  Has been off sedation since 07/01/15.  VITAL SIGNS: BP 150/43 mmHg  Pulse 66  Temp(Src) 98.8 F (37.1 C) (Core (Comment))  Resp 21  Ht  (1.549 m)  Wt 140 lb 14 oz (63.9 kg)  BMI 26.63 kg/m2  SpO2 99%  HEMODYNAMICS: CVP:  [5 mmHg-21 mmHg] 17 mmHg  VENTILATOR SETTINGS: Vent Mode:  [-] PSV;CPAP FiO2 (%):  [30 %] 30 % Set Rate:  [16 bmp] 16 bmp Vt Set:  [380 mL] 380 mL PEEP:  [5 cmH20] 5 cmH20 Pressure Support:  [5 cmH20] 5 cmH20  INTAKE / OUTPUT: I/O last 3 completed shifts: In: 3657.9 [I.V.:792.9; NG/GT:2730; IV Piggyback:135] Out: 957 [Urine:957]  PHYSICAL EXAMINATION: General: off sedation Neuro: comatose HEENT: ETT in place Cardiac: regular Chest: b/l rhonchi Abd: soft, non tender Ext: no edema Skin: no rashes  LABS:  BMET  Recent Labs Lab 07/02/15 0358 07/03/15 0520 07/04/15 0525  NA 137 135 133*  K 4.3 4.8 5.1  CL 112* 112* 107  CO2 13* 13* 13*  BUN 65* 70* 79*  CREATININE 3.44* 3.49* 3.46*  GLUCOSE 188* 332* 284*   Electrolytes  Recent Labs Lab 07/02/15 0358 07/03/15 0520 07/04/15 0525  CALCIUM 7.8* 7.7* 8.0*  MG 1.7 1.7 1.7  PHOS 5.4* 5.4* 5.2*   CBC  Recent Labs Lab 07/02/15 0358 07/03/15 0520 07/04/15 0525  WBC 12.7* 10.1 11.6*  HGB 8.8* 8.3* 8.3*  HCT 26.8* 25.4* 25.6*  PLT 158 143* 167   ABG  Recent Labs Lab 07/02/15 0421 07/03/15 0400 07/04/15 0418  PHART 7.241* 7.264* 7.269*  PCO2ART 29.7* 26.7* 26.6*  PO2ART 114* 108* 116*   Liver Enzymes  Recent Labs Lab 07/06/2015 1405 06/26/2015 2102 07/03/15 0520  AST 25 30  --   ALT 14 15  --   ALKPHOS 75 83  --   BILITOT 0.4 1.0  --   ALBUMIN 2.3* 2.3* 1.8*   Cardiac Enzymes  Recent Labs Lab 06/27/15 1151  TROPONINI 0.06*     Lab Results  Component Value Date   TSH 17.718* 07/13/2015    Glucose  Recent Labs Lab 07/03/15 0815 07/03/15 1135 07/03/15 1527 07/03/15 1932 07/03/15 2338 07/04/15 0339  GLUCAP 288* 296* 244* 224* 242* 237*   Imaging Dg Chest Port 1 View  07/04/2015  CLINICAL DATA:  Intubated patient, respiratory failure, status post CVA, seizure activity. EXAM: PORTABLE CHEST 1 VIEW COMPARISON:  Portable chest x-ray of July 03, 2015 FINDINGS: The patient is rotated slightly toward the left today. The lungs are well-expanded. There is persistent mild left lower lobe atelectasis and trace left pleural effusion. There is a tiny right pleural effusion visible today. The heart is normal in size. The pulmonary vascularity is not engorged. The mediastinum is normal in width. The endotracheal tube tip lies 3.8 cm above the carina. The esophagogastric tube tip projects below the inferior margin of the image. The left internal jugular venous catheter tip projects over the junction of the right and left brachiocephalic veins. IMPRESSION: Persistent left lower lobe atelectasis. Small left pleural effusion, stable. New trace right pleural effusion. The support tubes are in reasonable position. Electronically Signed   By: David  [place] M.D.   On: 07/04/2015 07:46   STUDIES:  2/01 CT head >>  chronic lacunar infarct 2/02 MRI brain >> abnormal signal frontal/parietal lobes on Lt 2/02 EEG >> burst suppression activity and bifrontal sharp activity with phase reversal at [facility] 2/05 EEG >> lateralize periodic discharges in the left frontal region  2/05 LP >> oligoclonal bands negative, IgG 7.5, protein 36, glucose 106, RBC 670, WBC 2  CULTURES: None  ANTIBIOTICS: None  SIGNIFICANT EVENTS: 2/4 VDRF for seizure and burst suppression 2/6 Sedation d/c'ed  LINES/TUBES: ETT 2/4 >> L IJ TLC 2/4 >>  DISCUSSION: 81 year old female with PMH seizure will transfer to the ICU for intubation and burst  suppression.  ASSESSMENT / PLAN:  NEUROLOGIC A:   Acute encephalopathy 2nd to status epilepticus with hx of CVA >> no improvement in mental status since sedation d/c'ed 2/06. Hx of depression, anxiety. P:   AEDs per neurology Further discuss with family and neurology planned for PM of 2/09  PULMONARY A: Acute respiratory failure with compromised airway 2nd to seizures. P:   Pressure support wean as tolerated F/u CXR intermittently Will need trach if family wishes to continue aggressive care  CARDIOVASCULAR A:  Drug induced hypotension 2nd to sedation >> resolved. Hx of HTN, HLD. P:  Wean off levophed to keep SBP > 90  RENAL A:   CKD stage 4 >> baseline creatinine 2.10 from 05/28/15. Non gap metabolic acidosis. P:   F/u BMET  GASTROINTESTINAL A:   Dysphagia. Hx of GERD. P:   Tube feeds Protonix for SUP Would need G tube if family wishes to continue aggressive care  HEMATOLOGIC A:   Anemia of critical illness. P:  F/u CBC intermittently SCD for DVT prevention  INFECTIOUS A:   No active issues. P:   Monitor clinically  ENDOCRINE A:   Hx of DM, hypothyroidism. P:   SSI Continue synthroid  Goals of Care I spoke with pt's daughter over phone this AM.  Explained that her mother has not had improvement in mental status.  Advised that they should have further discussion with neurology about long term neurology prognosis, and then decide goals of care.  Family meeting set for 07/04/15 in PM with neurology.  D/w Dr. Hosie Poisson.  CC time 38 minutes.  Coralyn Helling, MD [facility] Pulmonary/Critical Care 07/04/2015, 8:44 AM Pager:  [phone] After 3pm call: [phone]

## 2015-07-04 NOTE — Progress Notes (Signed)
Had family meeting with patients extended family to discuss her current situation, prognosis and long term goals. Counseled family that patient has been off all sedation for over 3 days and has not shown any improvement in mental status. Counseled them off the options of trach/PEG vs comfort care. They state that the patient would not want a trach/PEG. They wish to wait until Monday to withdraw care. Discussed patient and they wish to make her DNR at this time. Will continue to monitor.   Elspeth Cho, DO [facility] [phone]  If 7pm- 7am, please page neurology on call as listed in AMION.

## 2015-07-05 LAB — GLUCOSE, CAPILLARY
GLUCOSE-CAPILLARY: 213 mg/dL — AB (ref 65–99)
GLUCOSE-CAPILLARY: 216 mg/dL — AB (ref 65–99)
GLUCOSE-CAPILLARY: 202 mg/dL — AB (ref 65–99)
Glucose-Capillary: 225 mg/dL — ABNORMAL HIGH (ref 65–99)
Glucose-Capillary: 256 mg/dL — ABNORMAL HIGH (ref 65–99)
Glucose-Capillary: 308 mg/dL — ABNORMAL HIGH (ref 65–99)

## 2015-07-05 LAB — BASIC METABOLIC PANEL
Anion gap: 12 (ref 5–15)
BUN: 101 mg/dL — AB (ref 6–20)
CHLORIDE: 106 mmol/L (ref 101–111)
CO2: 14 mmol/L — AB (ref 22–32)
CREATININE: 3.95 mg/dL — AB (ref 0.44–1.00)
Calcium: 8.1 mg/dL — ABNORMAL LOW (ref 8.9–10.3)
GFR calc non Af Amer: 10 mL/min — ABNORMAL LOW (ref >60)
GFR, EST AFRICAN AMERICAN: 12 mL/min — AB (ref >60)
Glucose, Bld: 230 mg/dL — ABNORMAL HIGH (ref 65–99)
POTASSIUM: 5.1 mmol/L (ref 3.5–5.1)
Sodium: 132 mmol/L — ABNORMAL LOW (ref 135–145)

## 2015-07-05 LAB — PHENYTOIN LEVEL, FREE AND TOTAL
PHENYTOIN FREE: 4.4 ug/mL — AB (ref 1.0–2.0)
PHENYTOIN, TOTAL: 14.4 ug/mL (ref 10.0–20.0)

## 2015-07-05 NOTE — Progress Notes (Signed)
Pt remains on ventilator; family planning withdrawal of care on Monday, 07/08/15, after family meeting yesterday.  Will continue to follow/offer support.    Quintella Baton, RN, BSN  Trauma/Neuro ICU Case Manager [phone]

## 2015-07-05 NOTE — Progress Notes (Signed)
Results for TERICKA, DEVINCENZI (MRN [id number]) as of 07/05/2015 10:38  Ref. Range 07/04/2015 15:36 07/04/2015 20:22 07/04/2015 23:57 07/05/2015 03:44 07/05/2015 08:06  Glucose-Capillary Latest Ref Range: 65-99 mg/dL 865 (H) 784 (H) 696 (H) 213 (H) 216 (H)  CBGs continue to be greater than 180 mg/dl. Recommend adding Novolog 3 units every 4 hours for tube feed coverage if blood sugars continue to be elevated. Continue Lantus and Novolog MODERATE correction scale as ordered. May need to titrate Lantus dosage as needed. Smith Mince RN BSN CDE

## 2015-07-05 NOTE — Progress Notes (Addendum)
Upon assessment, pt tachypnea, RR 32-38 on ventilator. Notified E-link MD. Orders given for PRN fentanyl.

## 2015-07-05 NOTE — Progress Notes (Signed)
PULMONARY / CRITICAL CARE MEDICINE   Name: Gina Andersen MRN: [id number] DOB: November 21, 1934    ADMISSION DATE:  07/12/2015 CONSULTATION DATE:  07/07/2015  REFERRING MD:  TRH-MD  CHIEF COMPLAINT:  Status epilepticus  SUBJECTIVE:  Has been off sedation since 07/01/15.  Tolerating pressure support.  VITAL SIGNS: BP 100/36 mmHg  Pulse 70  Temp(Src) 96.8 F (36 C) (Core (Comment))  Resp 22  Ht  (1.549 m)  Wt 146 lb 13.2 oz (66.6 kg)  BMI 27.76 kg/m2  SpO2 100%  HEMODYNAMICS: CVP:  [3 mmHg-29 mmHg] 5 mmHg  VENTILATOR SETTINGS: Vent Mode:  [-] PSV;CPAP FiO2 (%):  [30 %] 30 % Set Rate:  [16 bmp] 16 bmp Vt Set:  [380 mL] 380 mL PEEP:  [5 cmH20] 5 cmH20 Pressure Support:  [5 cmH20-10 cmH20] 10 cmH20 Plateau Pressure:  [15 cmH20-16 cmH20] 15 cmH20  INTAKE / OUTPUT: I/O last 3 completed shifts: In: 2630.5 [I.V.:455.5; NG/GT:2040; IV Piggyback:135] Out: 607 [Urine:607]  PHYSICAL EXAMINATION: General: off sedation Neuro: comatose HEENT: ETT in place Cardiac: regular Chest: no wheeze Abd: soft, non tender Ext: no edema Skin: no rashes  LABS:  BMET  Recent Labs Lab 07/03/15 0520 07/04/15 0525 07/05/15 0620  NA 135 133* 132*  K 4.8 5.1 5.1  CL 112* 107 106  CO2 13* 13* 14*  BUN 70* 79* 101*  CREATININE 3.49* 3.46* 3.95*  GLUCOSE 332* 284* 230*   Electrolytes  Recent Labs Lab 07/02/15 0358 07/03/15 0520 07/04/15 0525 07/05/15 0620  CALCIUM 7.8* 7.7* 8.0* 8.1*  MG 1.7 1.7 1.7  --   PHOS 5.4* 5.4* 5.2*  --    CBC  Recent Labs Lab 07/02/15 0358 07/03/15 0520 07/04/15 0525  WBC 12.7* 10.1 11.6*  HGB 8.8* 8.3* 8.3*  HCT 26.8* 25.4* 25.6*  PLT 158 143* 167   ABG  Recent Labs Lab 07/02/15 0421 07/03/15 0400 07/04/15 0418  PHART 7.241* 7.264* 7.269*  PCO2ART 29.7* 26.7* 26.6*  PO2ART 114* 108* 116*   Liver Enzymes  Recent Labs Lab 07/17/2015 1405 07/06/2015 2102 07/03/15 0520  AST 25 30  --   ALT 14 15  --   ALKPHOS 75 83  --   BILITOT  0.4 1.0  --   ALBUMIN 2.3* 2.3* 1.8*    Glucose  Recent Labs Lab 07/04/15 1135 07/04/15 1536 07/04/15 2022 07/04/15 2357 07/05/15 0344 07/05/15 0806  GLUCAP 300* 396* 337* 308* 213* 216*   Imaging No results found.   STUDIES:  2/01 CT head >> chronic lacunar infarct 2/02 MRI brain >> abnormal signal frontal/parietal lobes on Lt 2/02 EEG >> burst suppression activity and bifrontal sharp activity with phase reversal at [facility] 2/05 EEG >> lateralize periodic discharges in the left frontal region  2/05 LP >> oligoclonal bands negative, IgG 7.5, protein 36, glucose 106, RBC 670, WBC 2  CULTURES: None  ANTIBIOTICS: None  SIGNIFICANT EVENTS: 2/4 VDRF for seizure and burst suppression 2/6 Sedation d/c'ed 2/9 Family meeting >> DNR  LINES/TUBES: ETT 2/4 >> L IJ TLC 2/4 >>  DISCUSSION: 81 year old female with PMH seizure will transfer to the ICU for intubation and burst suppression.  ASSESSMENT / PLAN:  NEUROLOGIC A:   Acute encephalopathy 2nd to status epilepticus with hx of CVA >> no improvement in mental status since sedation d/c'ed 2/06. Hx of depression, anxiety. P:   AEDs per neurology  PULMONARY A: Acute respiratory failure with compromised airway 2nd to seizures. P:   Pressure support wean as  tolerated Family planning for extubation to comfort 2/13  CARDIOVASCULAR A:  Drug induced hypotension 2nd to sedation >> resolved. Hx of HTN, HLD. P:  Monitor hemodynamics  RENAL A:   CKD stage 4 >> baseline creatinine 2.10 from 05/28/15. Non gap metabolic acidosis. P:   Defer additional lab testing  GASTROINTESTINAL A:   Dysphagia. Hx of GERD. P:   Tube feeds Protonix for SUP  HEMATOLOGIC A:   Anemia of critical illness. P:  Defer further lab testing SCD for DVT prevention  INFECTIOUS A:   No active issues. P:   Monitor clinically  ENDOCRINE A:   Hx of DM, hypothyroidism. P:   SSI Continue synthroid  Goals of Care >> DNR established  2/09.  Family planning for vent withdrawal 2/13.  No escalation of care.  Coralyn Helling, MD [facility] Pulmonary/Critical Care 07/05/2015, 10:19 AM Pager:  [phone] After 3pm call: [phone]

## 2015-07-05 NOTE — Progress Notes (Signed)
Subjective: No overnight events. Remains off sedation.   Exam: Filed Vitals:   07/05/15 0700 07/05/15 0745  BP: 96/34 98/37  Pulse: 68 70  Temp: 97 F (36.1 C)   Resp: 23 24   Gen: In bed, intubated Resp: ventilated Abd: soft, nt  Neuro MS: does not open eyes or follow commands [id number] Motor: wiggles toes to noxious stimuli but no true withdrawal Sensory: no grimace with noxious stimuli  Pertinent Labs: Bmp - unremarkable  Impression: 81 year old female with new onset seizures without return to baseline in between seizures. This coupled with the pattern on EEG make me suspect that the pattern did represent ongoing seizure. Was treated with burst suppression.   Unclear etiology at this point, ? precipitated by ischemic infarct. CSF results unremarkable. Her corrected dilantin level is markedly supra-therapeutic, question if this is contributing to her depressed mental state.   Off sedation for multiple days with no noted improvement, guarded prognosis for meaningful recovery at this time. Had family meeting, they wish to maintain care until Monday and then plan for withdrawal of care.  Recommendations: 1) continue vimpat 2) Weaned off sedation 3) Will continue to monitor and follow up as needed   Elspeth Cho, DO [facility] [phone]  If 7pm- 7am, please page neurology on call as listed in AMION.   07/05/2015  8:35 AM

## 2015-07-06 DIAGNOSIS — Z9289 Personal history of other medical treatment: Secondary | ICD-10-CM | POA: Insufficient documentation

## 2015-07-06 DIAGNOSIS — I10 Essential (primary) hypertension: Secondary | ICD-10-CM

## 2015-07-06 DIAGNOSIS — I6300 Cerebral infarction due to thrombosis of unspecified precerebral artery: Secondary | ICD-10-CM

## 2015-07-06 DIAGNOSIS — I9589 Other hypotension: Secondary | ICD-10-CM

## 2015-07-06 DIAGNOSIS — E1100 Type 2 diabetes mellitus with hyperosmolarity without nonketotic hyperglycemic-hyperosmolar coma (NKHHC): Secondary | ICD-10-CM

## 2015-07-06 LAB — GLUCOSE, CAPILLARY
GLUCOSE-CAPILLARY: 227 mg/dL — AB (ref 65–99)
GLUCOSE-CAPILLARY: 231 mg/dL — AB (ref 65–99)
Glucose-Capillary: 230 mg/dL — ABNORMAL HIGH (ref 65–99)
Glucose-Capillary: 228 mg/dL — ABNORMAL HIGH (ref 65–99)
Glucose-Capillary: 205 mg/dL — ABNORMAL HIGH (ref 65–99)
Glucose-Capillary: 180 mg/dL — ABNORMAL HIGH (ref 65–99)

## 2015-07-06 MED ORDER — INSULIN GLARGINE 100 UNIT/ML ~~LOC~~ SOLN
10.0000 [IU] | Freq: Two times a day (BID) | SUBCUTANEOUS | Status: DC
  Administered 2015-07-06: 10 [IU] via SUBCUTANEOUS
  Filled 2015-07-06 (×2): qty 0.1

## 2015-07-06 MED ORDER — ANTISEPTIC ORAL RINSE SOLUTION (CORINZ)
7.0000 mL | OROMUCOSAL | Status: DC
  Administered 2015-07-06 (×3): 7 mL via OROMUCOSAL

## 2015-07-06 MED ORDER — ANTISEPTIC ORAL RINSE SOLUTION (CORINZ)
7.0000 mL | OROMUCOSAL | Status: DC
  Administered 2015-07-06 – 2015-07-10 (×45): 7 mL via OROMUCOSAL

## 2015-07-06 NOTE — Progress Notes (Addendum)
Subjective: No overnight events. Remains off sedation. Free dilantin level 4.4  Exam: Filed Vitals:   07/06/15 0630 07/06/15 0700  BP: 108/43 108/41  Pulse: 75 75  Temp: 97.3 F (36.3 C) 97.5 F (36.4 C)  Resp: 22 23   Gen: In bed, intubated Resp: ventilated Abd: soft, nt  Neuro MS: does not open eyes or follow commands [id number] Motor: wiggles toes to noxious stimuli but no true withdrawal Sensory: no grimace with noxious stimuli  Pertinent Labs: Bmp - unremarkable  Impression: 81 year old female with new onset seizures without return to baseline in between seizures. This coupled with the pattern on EEG make me suspect that the pattern did represent ongoing seizure. Was treated with burst suppression.   Unclear etiology at this point, ? precipitated by ischemic infarct. CSF results unremarkable. Corrected dilantin level remains elevated though unlikely that this would explain her profoundly depressed mental status.   Off sedation for multiple days with no noted improvement, guarded prognosis for meaningful recovery at this time. Had family meeting, they wish to maintain care until Monday and then plan for withdrawal of care.  Recommendations: 1) continue vimpat 2) Weaned off sedation 3) Will continue to monitor and follow up as needed   Elspeth Cho, DO [facility] [phone]  If 7pm- 7am, please page neurology on call as listed in AMION.   07/06/2015  7:27 AM

## 2015-07-06 NOTE — Progress Notes (Signed)
PULMONARY / CRITICAL CARE MEDICINE   Name: LINZEY RAMSER MRN: [id number] DOB: 12/08/1934    ADMISSION DATE:  06/30/2015 CONSULTATION DATE:  07/15/2015  REFERRING MD:  TRH-MD  CHIEF COMPLAINT:  Status epilepticus  SUBJECTIVE:  Has been off sedation since 07/01/15.  WD planned 2/13  VITAL SIGNS: BP 108/41 mmHg  Pulse 75  Temp(Src) 97.5 F (36.4 C) (Core (Comment))  Resp 23  Ht  (1.549 m)  Wt 146 lb 6.2 oz (66.4 kg)  BMI 27.67 kg/m2  SpO2 100%  HEMODYNAMICS: CVP:  [3 mmHg-17 mmHg] 7 mmHg  VENTILATOR SETTINGS: Vent Mode:  [-] PRVC FiO2 (%):  [30 %] 30 % Set Rate:  [16 bmp] 16 bmp Vt Set:  [380 mL] 380 mL PEEP:  [5 cmH20] 5 cmH20 Pressure Support:  [10 cmH20] 10 cmH20 Plateau Pressure:  [15 cmH20-16 cmH20] 15 cmH20  INTAKE / OUTPUT: I/O last 3 completed shifts: In: 1728.9 [I.V.:43.9; Other:50; NG/GT:1500; IV Piggyback:135] Out: 622 [Urine:620; Stool:2]  PHYSICAL EXAMINATION: General: off sedation Neuro: comatose HEENT: ETT in place Cardiac: regular Chest: no wheeze Abd: soft, non tender, tf at goal Ext: no edema Skin: no rashes  LABS:  BMET  Recent Labs Lab 07/03/15 0520 07/04/15 0525 07/05/15 0620  NA 135 133* 132*  K 4.8 5.1 5.1  CL 112* 107 106  CO2 13* 13* 14*  BUN 70* 79* 101*  CREATININE 3.49* 3.46* 3.95*  GLUCOSE 332* 284* 230*   Electrolytes  Recent Labs Lab 07/02/15 0358 07/03/15 0520 07/04/15 0525 07/05/15 0620  CALCIUM 7.8* 7.7* 8.0* 8.1*  MG 1.7 1.7 1.7  --   PHOS 5.4* 5.4* 5.2*  --    CBC  Recent Labs Lab 07/02/15 0358 07/03/15 0520 07/04/15 0525  WBC 12.7* 10.1 11.6*  HGB 8.8* 8.3* 8.3*  HCT 26.8* 25.4* 25.6*  PLT 158 143* 167   ABG  Recent Labs Lab 07/02/15 0421 07/03/15 0400 07/04/15 0418  PHART 7.241* 7.264* 7.269*  PCO2ART 29.7* 26.7* 26.6*  PO2ART 114* 108* 116*   Liver Enzymes  Recent Labs Lab 07/13/2015 1405 07/14/2015 2102 07/03/15 0520  AST 25 30  --   ALT 14 15  --   ALKPHOS 75 83  --    BILITOT 0.4 1.0  --   ALBUMIN 2.3* 2.3* 1.8*    Glucose  Recent Labs Lab 07/05/15 1138 07/05/15 1552 07/05/15 2019 07/05/15 2337 07/06/15 0355 07/06/15 0810  GLUCAP 202* 225* 256* 231* 227* 230*   Imaging No results found.   STUDIES:  2/01 CT head >> chronic lacunar infarct 2/02 MRI brain >> abnormal signal frontal/parietal lobes on Lt 2/02 EEG >> burst suppression activity and bifrontal sharp activity with phase reversal at [facility] 2/05 EEG >> lateralize periodic discharges in the left frontal region  2/05 LP >> oligoclonal bands negative, IgG 7.5, protein 36, glucose 106, RBC 670, WBC 2  CULTURES: None  ANTIBIOTICS: None  SIGNIFICANT EVENTS: 2/4 VDRF for seizure and burst suppression 2/6 Sedation d/c'ed 2/9 Family meeting >> DNR  LINES/TUBES: ETT 2/4 >> L IJ TLC 2/4 >>  DISCUSSION: 81 year old female with planned wd care 2/13  ASSESSMENT / PLAN:  NEUROLOGIC A:   Acute encephalopathy 2nd to status epilepticus with hx of CVA >> no improvement in mental status since sedation d/c'ed 2/06. Hx of depression, anxiety. P:   AEDs per neurology  PULMONARY A: Acute respiratory failure with compromised airway 2nd to seizures. P:   Pressure support wean as tolerated Family planning for  extubation to comfort 2/13  CARDIOVASCULAR A:  Drug induced hypotension 2nd to sedation >> resolved. Hx of HTN, HLD. P:  Monitor hemodynamics  RENAL A:   CKD stage 4 >> baseline creatinine 2.10 from 05/28/15. Non gap metabolic acidosis. P:   Defer additional lab testing  GASTROINTESTINAL A:   Dysphagia. Hx of GERD. P:   Tube feeds Protonix for SUP  HEMATOLOGIC A:   Anemia of critical illness. P:  Defer further lab testing SCD for DVT prevention  INFECTIOUS A:   No active issues. P:   Monitor clinically  ENDOCRINE CBG (last 3)   Recent Labs  07/05/15 2337 07/06/15 0355 07/06/15 0810  GLUCAP 231* 227* 230*     A:   Hx of DM, hypothyroidism. P:    SSI Continue synthroid  Goals of Care >> DNR established 2/09.  Family planning for vent withdrawal 2/13.  No escalation of care.  Brett Canales Minor ACNP [facility] PCCM Pager [phone] till 3 pm If no answer page [phone] 07/06/2015, 8:28 AM   STAFF NOTE: I, Rory Percy, MD FACP have personally reviewed patient's available data, including medical history, events of note, physical examination and test results as part of my evaluation. I have discussed with resident/NP and other care providers such as pharmacist, RN and RRT. In addition, I personally evaluated patient and elicited key findings of: remains unresponsive, poor prognosis, family considering comfort Monday, we should have a low threshold for narcotics and comfort approach, Wean this am cpap 5 ps 5, goal 2 hr, no extubation planned, lacks airway protection skills, TF on going, doubt this will change outcome, no new pcxr, limit blood draws, d/w RN, also no family at bedside, consider dc cbg, lantus  The patient is critically ill with multiple organ systems failure and requires high complexity decision making for assessment and support, frequent evaluation and titration of therapies, application of advanced monitoring technologies and extensive interpretation of multiple databases.   Critical Care Time devoted to patient care services described in this note is 30 Minutes. This time reflects time of care of this signee: Rory Percy, MD FACP. This critical care time does not reflect procedure time, or teaching time or supervisory time of PA/NP/Med student/Med Resident etc but could involve care discussion time. Rest per NP/medical resident whose note is outlined above and that I agree with   Mcarthur Rossetti. Tyson Alias, MD, FACP Pgr: [phone] [place] Pulmonary & Critical [facility] 07/06/2015 2:19 PM

## 2015-07-07 LAB — GLUCOSE, CAPILLARY
GLUCOSE-CAPILLARY: 187 mg/dL — AB (ref 65–99)
GLUCOSE-CAPILLARY: 216 mg/dL — AB (ref 65–99)
GLUCOSE-CAPILLARY: 245 mg/dL — AB (ref 65–99)
GLUCOSE-CAPILLARY: 251 mg/dL — AB (ref 65–99)
Glucose-Capillary: 213 mg/dL — ABNORMAL HIGH (ref 65–99)
Glucose-Capillary: 286 mg/dL — ABNORMAL HIGH (ref 65–99)

## 2015-07-07 MED ORDER — CHLORHEXIDINE GLUCONATE 0.12 % MT SOLN
OROMUCOSAL | Status: AC
  Administered 2015-07-07: 15 mL
  Filled 2015-07-07: qty 15

## 2015-07-07 MED ORDER — SODIUM BICARBONATE 8.4 % IV SOLN
INTRAVENOUS | Status: DC
  Administered 2015-07-07 – 2015-07-10 (×4): via INTRAVENOUS
  Filled 2015-07-07 (×10): qty 150

## 2015-07-07 NOTE — Progress Notes (Signed)
PULMONARY / CRITICAL CARE MEDICINE   Name: Gina Andersen MRN: [id number] DOB: 1934-07-15    ADMISSION DATE:  07/23/2015 CONSULTATION DATE:  07/15/2015  REFERRING MD:  TRH-MD  CHIEF COMPLAINT:  Status epilepticus  SUBJECTIVE:  Has been off sedation since 07/01/15.  WD planned 2/13. Grimaces to pain  VITAL SIGNS: BP 155/47 mmHg  Pulse 75  Temp(Src) 97.2 F (36.2 C) (Core (Comment))  Resp 25  Ht  (1.549 m)  Wt 146 lb 6.2 oz (66.4 kg)  BMI 27.67 kg/m2  SpO2 99%  HEMODYNAMICS:    VENTILATOR SETTINGS: Vent Mode:  [-] CPAP;PSV FiO2 (%):  [30 %] 30 % Set Rate:  [16 bmp] 16 bmp Vt Set:  [380 mL] 380 mL PEEP:  [5 cmH20] 5 cmH20 Pressure Support:  [8 cmH20-10 cmH20] 10 cmH20 Plateau Pressure:  [6 cmH20-17 cmH20] 6 cmH20  INTAKE / OUTPUT: I/O last 3 completed shifts: In: 1605 [Other:50; NG/GT:1440; IV Piggyback:115] Out: 492 [Urine:490; Stool:2]  PHYSICAL EXAMINATION: General: off sedation Neuro: comatose, yawns and grimaces to pain. HEENT: ETT in place Cardiac: regular Chest: no wheeze Abd: soft, non tender, tf at goal Ext: no edema Skin: no rashes  LABS:  BMET  Recent Labs Lab 07/03/15 0520 07/04/15 0525 07/05/15 0620  NA 135 133* 132*  K 4.8 5.1 5.1  CL 112* 107 106  CO2 13* 13* 14*  BUN 70* 79* 101*  CREATININE 3.49* 3.46* 3.95*  GLUCOSE 332* 284* 230*   Electrolytes  Recent Labs Lab 07/02/15 0358 07/03/15 0520 07/04/15 0525 07/05/15 0620  CALCIUM 7.8* 7.7* 8.0* 8.1*  MG 1.7 1.7 1.7  --   PHOS 5.4* 5.4* 5.2*  --    CBC  Recent Labs Lab 07/02/15 0358 07/03/15 0520 07/04/15 0525  WBC 12.7* 10.1 11.6*  HGB 8.8* 8.3* 8.3*  HCT 26.8* 25.4* 25.6*  PLT 158 143* 167   ABG  Recent Labs Lab 07/02/15 0421 07/03/15 0400 07/04/15 0418  PHART 7.241* 7.264* 7.269*  PCO2ART 29.7* 26.7* 26.6*  PO2ART 114* 108* 116*   Liver Enzymes  Recent Labs Lab 07/03/15 0520  ALBUMIN 1.8*    Glucose  Recent Labs Lab 07/06/15 1132  07/06/15 1615 07/06/15 1916 07/07/15 0007 07/07/15 0449 07/07/15 0801  GLUCAP 228* 205* 180* 187* 213* 216*   Imaging No results found.   STUDIES:  2/01 CT head >> chronic lacunar infarct 2/02 MRI brain >> abnormal signal frontal/parietal lobes on Lt 2/02 EEG >> burst suppression activity and bifrontal sharp activity with phase reversal at [facility] 2/05 EEG >> lateralize periodic discharges in the left frontal region  2/05 LP >> oligoclonal bands negative, IgG 7.5, protein 36, glucose 106, RBC 670, WBC 2  CULTURES: None  ANTIBIOTICS: None  SIGNIFICANT EVENTS: 2/4 VDRF for seizure and burst suppression 2/6 Sedation d/c'ed 2/9 Family meeting >> DNR  LINES/TUBES: ETT 2/4 >> L IJ TLC 2/4 >>  DISCUSSION: 81 year old female with planned wd care 2/13  ASSESSMENT / PLAN:  NEUROLOGIC A:   Acute encephalopathy 2nd to status epilepticus with hx of CVA >> no improvement in mental status since sedation d/c'ed 2/06. 2/12 grimaces to pain, yawns Hx of depression, anxiety. P:   AEDs per neurology  PULMONARY A: Acute respiratory failure with compromised airway 2nd to seizures. P:   Pressure support wean as tolerated. Tolerates PS  Family planning for extubation to comfort 2/13  CARDIOVASCULAR A:  Drug induced hypotension 2nd to sedation >> resolved. Hx of HTN, HLD. P:  Monitor  hemodynamics  RENAL A:   CKD stage 4 >> baseline creatinine 2.10 from 05/28/15. Non gap metabolic acidosis. P:   Defer additional lab testing  GASTROINTESTINAL A:   Dysphagia. Hx of GERD. P:   Tube feeds Protonix for SUP  HEMATOLOGIC A:   Anemia of critical illness. P:  Defer further lab testing SCD for DVT prevention  INFECTIOUS A:   No active issues. P:   Monitor clinically  ENDOCRINE CBG (last 3)   Recent Labs  07/07/15 0007 07/07/15 0449 07/07/15 0801  GLUCAP 187* 213* 216*     A:   Hx of DM, hypothyroidism. P:   SSI Continue synthroid  Goals of Care >> DNR  established 2/09.  Family planning for vent withdrawal 2/13.  No escalation of care.  Brett Canales Minor ACNP [facility] PCCM Pager [phone] till 3 pm If no answer page [phone] 07/07/2015, 9:15 AM    07/07/2015 9:15 AM  STAFF NOTE: I, Rory Percy, MD FACP have personally reviewed patient's available data, including medical history, events of note, physical examination and test results as part of my evaluation. I have discussed with resident/NP and other care providers such as pharmacist, RN and RRT. In addition, I personally evaluated patient and elicited key findings of: BUN and crt rising, Na noted, seems that we are still having medical management, will have ps balance, nonag, add bicarb, re assess chem in am, wean cpap 5 ps 5, goal 2 hr, no extubation planned, no new pcxr , none needed, re evaluate family meeting in am    Mcarthur Rossetti. Tyson Alias, MD, FACP Pgr: [phone] [place] [facility] 07/07/2015 11:33 AM

## 2015-07-08 ENCOUNTER — Inpatient Hospital Stay (HOSPITAL_COMMUNITY): Payer: Medicare Other

## 2015-07-08 LAB — BASIC METABOLIC PANEL
Anion gap: 12 (ref 5–15)
BUN: 142 mg/dL — ABNORMAL HIGH (ref 6–20)
CHLORIDE: 104 mmol/L (ref 101–111)
CO2: 16 mmol/L — ABNORMAL LOW (ref 22–32)
CREATININE: 5.45 mg/dL — AB (ref 0.44–1.00)
Calcium: 8.8 mg/dL — ABNORMAL LOW (ref 8.9–10.3)
GFR, EST AFRICAN AMERICAN: 8 mL/min — AB (ref >60)
GFR, EST NON AFRICAN AMERICAN: 7 mL/min — AB (ref >60)
Glucose, Bld: 293 mg/dL — ABNORMAL HIGH (ref 65–99)
POTASSIUM: 5.2 mmol/L — AB (ref 3.5–5.1)
SODIUM: 132 mmol/L — AB (ref 135–145)

## 2015-07-08 LAB — GLUCOSE, CAPILLARY
GLUCOSE-CAPILLARY: 243 mg/dL — AB (ref 65–99)
GLUCOSE-CAPILLARY: 268 mg/dL — AB (ref 65–99)
GLUCOSE-CAPILLARY: 276 mg/dL — AB (ref 65–99)
GLUCOSE-CAPILLARY: 277 mg/dL — AB (ref 65–99)
GLUCOSE-CAPILLARY: 305 mg/dL — AB (ref 65–99)
Glucose-Capillary: 241 mg/dL — ABNORMAL HIGH (ref 65–99)

## 2015-07-08 MED ORDER — SODIUM POLYSTYRENE SULFONATE 15 GM/60ML PO SUSP: 30.0000 g | Freq: Once | ORAL | Status: DC

## 2015-07-08 NOTE — Progress Notes (Signed)
PULMONARY / CRITICAL CARE MEDICINE   Name: Gina Andersen MRN: [id number] DOB: 1935-05-14    ADMISSION DATE:  2015/07/25 CONSULTATION DATE:  07/25/15  REFERRING MD:  TRH-MD  CHIEF COMPLAINT:  Status epilepticus  LINES/TUBES: ETT 2/4 >> L IJ TLC 2/4 >>STUDIES:  2/01 CT head >> chronic lacunar infarct 2/02 MRI brain >> abnormal signal frontal/parietal lobes on Lt 2/02 EEG >> burst suppression activity and bifrontal sharp activity with phase reversal at [facility] 2/05 EEG >> lateralize periodic discharges in the left frontal region  2/05 LP >> oligoclonal bands negative, IgG 7.5, protein 36, glucose 106, RBC 670, WBC 2  CULTURES: None  ANTIBIOTICS: None  SIGNIFICANT EVENTS: 2/4 VDRF for seizure and burst suppression 2/6 Sedation d/c'ed 2/9 Family meeting >> DNR 2/12 - Has been off sedation since 07/01/15.  WD planned 2/13. Grimaces to pain   SUBJECTIVE/OVERNIGHT/INTERVAL HX 07/08/15 - famiy not at bedside but RN says family having 2nd thoughts about terminal wean and want to get a Ct head. Patient unresponsive but has pupil, gag, able to do PSV and cough. On bic gtt and creat up at 5   VITAL SIGNS: BP 120/46 mmHg  Pulse 81  Temp(Src) 98.8 F (37.1 C) (Core (Comment))  Resp 22  Ht  (1.549 m)  Wt 66.4 kg (146 lb 6.2 oz)  BMI 27.67 kg/m2  SpO2 100%  HEMODYNAMICS:    VENTILATOR SETTINGS: Vent Mode:  [-] PRVC FiO2 (%):  [30 %] 30 % Set Rate:  [16 bmp] 16 bmp Vt Set:  [380 mL] 380 mL PEEP:  [5 cmH20] 5 cmH20 Pressure Support:  [10 cmH20] 10 cmH20 Plateau Pressure:  [11 cmH20-14 cmH20] 14 cmH20  INTAKE / OUTPUT: I/O last 3 completed shifts: In: 2618.8 [I.V.:1143.8; NG/GT:1360; IV Piggyback:115] Out: 330 [Urine:330]  PHYSICAL EXAMINATION: General: off sedation Neuro: comatose, - off sedation HEENT: ETT in place Cardiac: regular Chest: no wheeze Abd: soft, non tender, tf at goal Ext: no edema Skin: no rashes  LABS:  BMET  Recent Labs Lab 07/04/15 0525  07/05/15 0620 07/08/15 0345  NA 133* 132* 132*  K 5.1 5.1 5.2*  CL 107 106 104  CO2 13* 14* 16*  BUN 79* 101* 142*  CREATININE 3.46* 3.95* 5.45*  GLUCOSE 284* 230* 293*   Electrolytes  Recent Labs Lab 07/02/15 0358 07/03/15 0520 07/04/15 0525 07/05/15 0620 07/08/15 0345  CALCIUM 7.8* 7.7* 8.0* 8.1* 8.8*  MG 1.7 1.7 1.7  --   --   PHOS 5.4* 5.4* 5.2*  --   --    CBC  Recent Labs Lab 07/02/15 0358 07/03/15 0520 07/04/15 0525  WBC 12.7* 10.1 11.6*  HGB 8.8* 8.3* 8.3*  HCT 26.8* 25.4* 25.6*  PLT 158 143* 167   ABG  Recent Labs Lab 07/02/15 0421 07/03/15 0400 07/04/15 0418  PHART 7.241* 7.264* 7.269*  PCO2ART 29.7* 26.7* 26.6*  PO2ART 114* 108* 116*   Liver Enzymes  Recent Labs Lab 07/03/15 0520  ALBUMIN 1.8*    Glucose  Recent Labs Lab 07/07/15 0449 07/07/15 0801 07/07/15 1132 07/07/15 1555 07/07/15 1933 07/08/15 0018  GLUCAP 213* 216* 245* 251* 286* 268*   Imaging No results found.   DISCUSSION: 81 year old female with planned wd care 2/13 - nursing reports family having second thoguths 07/08/15  ASSESSMENT / PLAN:  NEUROLOGIC A:   Acute encephalopathy 2nd to status epilepticus with hx of CVA >> no improvement in mental status since sedation d/c'ed 2/06. 2/12 grimaces to pain, yawns Hx  of depression, anxiety.   - comatoase without change 07/08/15 P:   AEDs per neurology  PULMONARY A: Acute respiratory failure with compromised airway 2nd to seizures.   - does SBT but not a candiate for medical extubation due to neuro isues  P:   Pressure support wean as tolerated. Tolerates PS  Family planning for extubation to comfort 2/13 -  CARDIOVASCULAR A:  Drug induced hypotension 2nd to sedation >> resolved. Hx of HTN, HLD. P:  Monitor hemodynamics  RENAL A:   CKD stage 4 >> baseline creatinine 2.10 from 05/28/15. Non gap metabolic acidosis.   - worsening creat 5  On 07/08/2015  P:   Defer additional lab testing contine  bicarb gtt  GASTROINTESTINAL A:   Dysphagia. Hx of GERD. P:   Tube feeds Protonix for SUP  HEMATOLOGIC A:   Anemia of critical illness. P:  Defer further lab testing SCD for DVT prevention  INFECTIOUS A:   No active issues. P:   Monitor clinically  ENDOCRINE CBG (last 3)   Recent Labs  07/07/15 1555 07/07/15 1933 07/08/15 0018  GLUCAP 251* 286* 268*     A:   Hx of DM, hypothyroidism. P:   SSI Continue synthroid  Goals of Care >> DNR established 2/09.  Family planning for vent withdrawal 2/13 per earlier repiort but having 2nd thoughts per RN 07/08/15. Marland Kitchen  No escalation of care but have asked neuro to re-eval. AWait family at bedside to re-goal     The patient is critically ill with multiple organ systems failure and requires high complexity decision making for assessment and support, frequent evaluation and titration of therapies, application of advanced monitoring technologies and extensive interpretation of multiple databases.   Critical Care Time devoted to patient care services described in this note is  30  Minutes. This time reflects time of care of this signee Dr Kalman Shan. This critical care time does not reflect procedure time, or teaching time or supervisory time of PA/NP/Med student/Med Resident etc but could involve care discussion time    Dr. Kalman Shan, M.D., [facility]C.P Pulmonary and Critical Care Medicine Staff Physician [place] [place] [facility] Pager: [phone], If no answer or between  15:00h - 7:00h: call [phone]  07/08/2015 8:57 AM

## 2015-07-08 NOTE — Progress Notes (Signed)
Interval History:                                                                                                                      Gina Andersen is an 81 y.o. female patient who was admitted to [place] on 06/26/15 with acute encephalopathy and  seizures in the setting of hypoglycemia. She was being treated in her nursing facility for UTI known to have ESBL at the time. [facility]  [facility] of status epilepticus.  Her initial EEG showed left frontal evolving PLEDS s/o status epilepticus. She was started on propofol to achieve burst suppression. Sedation with propofol and other agents have been discontinued since 07/01/15. There has been no improvement in her mental status. Minimal grimace to stimulation.    Past Medical History: Past Medical History  Diagnosis Date  . Hypothyroidism 11/01/1995  . Anxiety 11/01/1995  . Diabetes mellitus type II 05/25/1996    insulin-requiring  . Hyperlipemia 06/25/2002  . Atopic dermatitis   . History of MRI of brain and brain stem 07/10/2005    w amd w/o no mass/infarct small vess dz  . Dehydration     Hosp [facility], hyperglycemia and ketoacidosis (stopped insulin)  . Pancytopenia 06/24-7/05/2007    Tissue D/O  . History of gallstones 11/21/2007    Abd U/S multip gallstones contracted GB CBD ULN Tr hydronephr R  . Acute cholecystitis 12/1/-05/20/08    [facility], s/p lap choleycystectomy CTD DM w/hypoglycemic sz in ER  . Gallstones 05/11/08    abd U/S gallstones pericholeycystic fluid [facility]  . Gastro-esophageal reflux disease without esophagitis   . Depression   . Anxiety disorder   . Dysphagia   . Hearing impaired   . Hypertension   . Carpal tunnel syndrome   . Weakness   . Anemia   . Glaucoma   . Mood disorder (HCC)   . Kidney disease     Past Surgical History  Procedure Laterality Date  . Partial hysterectomy      OV intact  . Carpel tunn      right  . Trigger finger release      left  . Ganglionectomy    .  Cholecystectomy    . Abdominal hysterectomy      Family History: Family History  Problem Relation Age of Onset  . Kidney disease Mother   . Diabetes Mother   . Hypertension Mother   . Diabetes Brother   . Stroke Brother   . Stroke Brother     CVA  . Diabetes Brother     Social History:   reports that she has never smoked. She has never used smokeless tobacco. She reports that she does not drink alcohol or use illicit drugs.  Allergies:  Allergies  Allergen Reactions  . Sulfa Antibiotics Other (See Comments)    Reaction:  Unknown   . Hydrocodone-Acetaminophen Other (See Comments)    Reaction:  Dizziness   . Lamictal [Lamotrigine] Other (See Comments)  Reaction:  Altered mental status   . Lyrica [Pregabalin] Other (See Comments)    Reaction:  Altered mental status   . Meloxicam Nausea And Vomiting  . Prednisone Other (See Comments)    Reaction:  Altered mental status   . Seroquel [Quetiapine] Other (See Comments)    Reaction:  Altered mental status   . Zoloft [Sertraline] Other (See Comments)    Reaction:  Altered mental status   . Penicillins Rash and Other (See Comments)    Unable to obtain enough information to answer additional questions about this medication.       Medications:                                                                                                                         Current facility-administered medications:  .  acetaminophen (TYLENOL) solution 650 mg, 650 mg, Oral, Q6H PRN, Coralyn Helling, MD .  antiseptic oral rinse solution (CORINZ), 7 mL, Mouth Rinse, Q2H, William S Minor, NP, 7 mL at 07/08/15 1743 .  aspirin tablet 325 mg, 325 mg, Per Tube, Daily, Coralyn Helling, MD, 325 mg at 07/08/15 0958 .  chlorhexidine gluconate (PERIDEX) 0.12 % solution 15 mL, 15 mL, Mouth Rinse, BID, Rejeana Brock, MD, 15 mL at 07/08/15 0826 .  feeding supplement (VITAL AF 1.2 CAL) liquid 1,000 mL, 1,000 mL, Per Tube, Continuous, Arlyss Gandy, RD,  Last Rate: 40 mL/hr at 07/07/15 0227, 1,000 mL at 07/07/15 0227 .  insulin aspart (novoLOG) injection 0-15 Units, 0-15 Units, Subcutaneous, 6 times per day, Gwenyth Bender, NP, 8 Units at 07/08/15 1743 .  lacosamide (VIMPAT) 200 mg in sodium chloride 0.9 % 25 mL IVPB, 200 mg, Intravenous, Q12H, Lesle Chris Black, NP, 200 mg at 07/08/15 0959 .  levothyroxine (SYNTHROID, LEVOTHROID) tablet 150 mcg, 150 mcg, Oral, QAC breakfast, Coralyn Helling, MD, 150 mcg at 07/08/15 0824 .  LORazepam (ATIVAN) injection 2 mg, 2 mg, Intravenous, Q4H PRN, Lesle Chris Black, NP .  mupirocin ointment (BACTROBAN) 2 % 1 application, 1 application, Nasal, BID, Gwenyth Bender, NP, 1 application at 07/08/15 1011 .  pantoprazole sodium (PROTONIX) 40 mg/20 mL oral suspension 40 mg, 40 mg, Per Tube, Q24H, Coralyn Helling, MD, 40 mg at 07/08/15 0857 .  sodium bicarbonate 150 mEq in dextrose 5 % 1,000 mL infusion, , Intravenous, Continuous, Nelda Bucks, MD, Last Rate: 75 mL/hr at 07/08/15 0502   Neurologic Examination:  Blood pressure 119/46, pulse 81, temperature 98.2 F (36.8 C), temperature source Core (Comment), resp. rate 23, height  (1.549 m), weight 66.4 kg (146 lb 6.2 oz), SpO2 100 %.  Intubated. Preserved brain stem reflexes.  Minimal grimace to stimulation, otherwise, no motor response.    Lab Results: Basic Metabolic Panel:  Recent Labs Lab 07/02/15 0358 07/03/15 0520 07/04/15 0525 07/05/15 0620 07/08/15 0345  NA 137 135 133* 132* 132*  K 4.3 4.8 5.1 5.1 5.2*  CL 112* 112* 107 106 104  CO2 13* 13* 13* 14* 16*  GLUCOSE 188* 332* 284* 230* 293*  BUN 65* 70* 79* 101* 142*  CREATININE 3.44* 3.49* 3.46* 3.95* 5.45*  CALCIUM 7.8* 7.7* 8.0* 8.1* 8.8*  MG 1.7 1.7 1.7  --   --   PHOS 5.4* 5.4* 5.2*  --   --     Liver Function Tests:  Recent Labs Lab 07/03/15 0520  ALBUMIN 1.8*   No results for input(s):  LIPASE, AMYLASE in the last 168 hours. No results for input(s): AMMONIA in the last 168 hours.  CBC:  Recent Labs Lab 07/02/15 0358 07/03/15 0520 07/04/15 0525  WBC 12.7* 10.1 11.6*  HGB 8.8* 8.3* 8.3*  HCT 26.8* 25.4* 25.6*  MCV 86.7 87.6 88.3  PLT 158 143* 167    Cardiac Enzymes: No results for input(s): CKTOTAL, CKMB, CKMBINDEX, TROPONINI in the last 168 hours.  Lipid Panel:  Recent Labs Lab 07/02/15 2005  TRIG 123    CBG:  Recent Labs Lab 07/07/15 1555 07/07/15 1933 07/08/15 0018 07/08/15 0822 07/08/15 1159  GLUCAP 251* 286* 268* 243* 241*    Microbiology: Results for orders placed or performed during the hospital encounter of 06/26/15  Urine culture     Status: None   Collection Time: 06/26/15  7:17 PM  Result Value Ref Range Status   Specimen Description URINE, RANDOM  Final   Special Requests NONE  Final   Culture MULTIPLE SPECIES PRESENT, SUGGEST RECOLLECTION  Final   Report Status 06/28/2015 FINAL  Final  Blood culture (routine x 2)     Status: None   Collection Time: 06/26/15  9:09 PM  Result Value Ref Range Status   Specimen Description BLOOD RIGHT ANTECUBITAL  Final   Special Requests BOTTLES DRAWN AEROBIC AND ANAEROBIC  Final   Culture NO GROWTH 6 DAYS  Final   Report Status 07/02/2015 FINAL  Final  Blood culture (routine x 2)     Status: None   Collection Time: 06/26/15  9:09 PM  Result Value Ref Range Status   Specimen Description BLOOD LEFT HAND  Final   Special Requests BOTTLES DRAWN AEROBIC AND ANAEROBIC  Final   Culture NO GROWTH 6 DAYS  Final   Report Status 07/02/2015 FINAL  Final  MRSA PCR Screening     Status: Abnormal   Collection Time: 06/27/15 12:40 AM  Result Value Ref Range Status   MRSA by PCR POSITIVE (A) NEGATIVE Final    Comment: CRITICAL RESULT CALLED TO, READ BACK BY AND VERIFIED WITH: ADRIAN WHITE ON 06/27/15 AT 0242 [facility]        The GeneXpert MRSA Assay (FDA approved for NASAL specimens only), is one  component of a comprehensive MRSA colonization surveillance program. It is not intended to diagnose MRSA infection nor to guide or monitor treatment for MRSA infections.     Imaging: Mr Brain Wo Contrast  07/08/2015  CLINICAL DATA:  Acute encephalopathy secondary to status epilepticus. EXAM: MRI HEAD  WITHOUT CONTRAST TECHNIQUE: Multiplanar, multiecho pulse sequences of the brain and surrounding structures were obtained without intravenous contrast. COMPARISON:  06/27/2015 FINDINGS: Some sequences are mildly to moderately motion degraded. There is an approximately 1 cm acute infarct at the genu of the left internal capsule. There are 2 punctate foci of diffusion abnormality involving left occipital cortex with additional punctate foci in both temporoparietal regions, left external capsule region, and subcortical left frontal white matter most compatible with additional tiny acute embolic infarcts. Mild diffusion signal abnormality remains in the right centrum semiovale corresponding to the suspected acute infarct on the prior study. Increased diffusion weighted signal is again seen involving medial frontal lobe cortex, left greater than right with some extension to the left parietal lobe. This has decreased in severity from the prior study and is again without associated T2/FLAIR signal abnormality. There is no evidence of intracranial hemorrhage, mass, midline shift, or extra-axial fluid collection. Dedicated imaging through the mesial temporal lobes demonstrates symmetric volume of the hippocampi without definite signal abnormality identified within limitations of motion artifact. There is mild global cerebral atrophy. Patchy to confluent T2 hyperintensities throughout the cerebral white matter bilaterally are unchanged and compatible with rather extensive chronic small vessel ischemic disease. A chronic lacunar infarct is again noted in the posterior right corona radiata. Prior bilateral cataract  extraction is noted. There is essentially complete right frontal and right ethmoid sinus opacification. There is also opacification of multiple anterior left ethmoid air cells, and small volume fluid is present in the maxillary sinuses. Fluid is also noted in the nasopharynx. There are moderately large bilateral mastoid effusions. Endotracheal and enteric tubes are partially visualized. Major intracranial vascular flow voids are preserved. IMPRESSION: 1. Punctate, acute embolic infarcts scattered throughout the left cerebral hemisphere and in the right temporoparietal region. 2. Persistent but improved diffusion signal abnormality involving medial frontal lobe cortex bilaterally, likely the sequelae of recent seizure activity. 3. Extensive chronic small vessel ischemic disease. Electronically Signed   By: Sebastian Ache M.D.   On: 07/08/2015 17:47    Assessment and plan:   Gina Andersen is an 81 y.o. female patient who was admitted to [facility] on 06/26/15 with acute encephalopathy and  seizures in the setting of hypoglycemia. She was being treated in her nursing facility for UTI known to have ESBL at the time. [facility]pticus.  Her initial EEG showed left frontal evolving PLEDS s/o status epilepticus. She was started on propofol to achieve burst suppression. Sedation with propofol and other agents have been discontinued since 07/01/15. There has been no improvement in her mental status. Minimal grimace to stimulation.   MRI brain repeated today, showed a few scattered punctate embolic infarcts, and b/l medial frontal cortical signal changes from status epilepticus.   In conclusion, given the lack of any improvement noted despite being off of sedation for a weak, with status epilepticus and severe encephalopathy, the prognosis for neurological recovery is poor.  Family is considering comfort measures.

## 2015-07-08 NOTE — Progress Notes (Signed)
Per MD notes, family having second thoughts about withdrawing care.  Plan repeat CT of the head and re-neuro eval.  Will continue to follow progress.    Quintella Baton, RN, BSN  Trauma/Neuro ICU Case Manager [phone]

## 2015-07-08 NOTE — Progress Notes (Signed)
Pt placed back on full support at this time due to transport to MRI.

## 2015-07-09 ENCOUNTER — Ambulatory Visit: Payer: Medicare Other | Admitting: Sports Medicine

## 2015-07-09 LAB — GLUCOSE, CAPILLARY
GLUCOSE-CAPILLARY: 262 mg/dL — AB (ref 65–99)
GLUCOSE-CAPILLARY: 199 mg/dL — AB (ref 65–99)
GLUCOSE-CAPILLARY: 222 mg/dL — AB (ref 65–99)
GLUCOSE-CAPILLARY: 299 mg/dL — AB (ref 65–99)
Glucose-Capillary: 240 mg/dL — ABNORMAL HIGH (ref 65–99)
Glucose-Capillary: 249 mg/dL — ABNORMAL HIGH (ref 65–99)
Glucose-Capillary: 281 mg/dL — ABNORMAL HIGH (ref 65–99)

## 2015-07-09 NOTE — Progress Notes (Signed)
PULMONARY / CRITICAL CARE MEDICINE   Name: Gina Andersen MRN: [id number] DOB: 1934/08/25    ADMISSION DATE:  07/01/15 CONSULTATION DATE:  07/01/2015  REFERRING MD:  TRH-MD  CHIEF COMPLAINT:  Status epilepticus  LINES/TUBES: ETT 2/4 >> L IJ TLC 2/4 >>STUDIES:  2/01 CT head >> chronic lacunar infarct 2/02 MRI brain >> abnormal signal frontal/parietal lobes on Lt 2/02 EEG >> burst suppression activity and bifrontal sharp activity with phase reversal at [facility] 2/05 EEG >> lateralize periodic discharges in the left frontal region  2/05 LP >> oligoclonal bands negative, IgG 7.5, protein 36, glucose 106, RBC 670, WBC 2  CULTURES: None  ANTIBIOTICS: None  SIGNIFICANT EVENTS: 2/4 VDRF for seizure and burst suppression 2/6 Sedation d/c'ed 2/9 Family meeting >> DNR 2/12 - Has been off sedation since 07/01/15.  WD planned 2/13. Grimaces to pain  07/08/15 - famiy not at bedside but RN says family having 2nd thoughts about terminal wean and want to get a Ct head. Patient unresponsive but has pupil, gag, able to do PSV and cough. On bic gtt and creat up at 5   SUBJECTIVE/OVERNIGHT/INTERVAL HX 07/09/15 - coma continues. No family at bedside. D/w DPOA daugther Gina Andersen of [place], [place] - > hard to understand, rambles a lot but gist is that they have no $ and they are going to [place], [place] to make funeral arrangement and wanted patient to be left in hands of Jesus. She had hard time committing to terminal wean. She did acknowledge aptient is critical, has renal failure and esizures and will not want long term care   VITAL SIGNS: BP 123/57 mmHg  Pulse 80  Temp(Src) 99 F (37.2 C) (Core (Comment))  Resp 22  Ht  (1.549 m)  Wt 66.4 kg (146 lb 6.2 oz)  BMI 27.67 kg/m2  SpO2 100%  HEMODYNAMICS:    VENTILATOR SETTINGS: Vent Mode:  [-] PSV;CPAP FiO2 (%):  [30 %] 30 % Set Rate:  [16 bmp] 16 bmp Vt Set:  [380 mL] 380 mL PEEP:  [5 cmH20] 5 cmH20 Pressure Support:  [5 cmH20] 5  cmH20 Plateau Pressure:  [14 cmH20-15 cmH20] 14 cmH20  INTAKE / OUTPUT: I/O last 3 completed shifts: In: 4050 [I.V.:2550; NG/GT:1360; IV Piggyback:140] Out: 716 [Urine:716]  PHYSICAL EXAMINATION: General: off sedation Neuro: comatose, - off sedation HEENT: ETT in place Cardiac: regular Chest: no wheeze Abd: soft, non tender, tf at goal Ext: no edema Skin: no rashes  LABS:  BMET  Recent Labs Lab 07/04/15 0525 07/05/15 0620 07/08/15 0345  NA 133* 132* 132*  K 5.1 5.1 5.2*  CL 107 106 104  CO2 13* 14* 16*  BUN 79* 101* 142*  CREATININE 3.46* 3.95* 5.45*  GLUCOSE 284* 230* 293*   Electrolytes  Recent Labs Lab 07/03/15 0520 07/04/15 0525 07/05/15 0620 07/08/15 0345  CALCIUM 7.7* 8.0* 8.1* 8.8*  MG 1.7 1.7  --   --   PHOS 5.4* 5.2*  --   --    CBC  Recent Labs Lab 07/03/15 0520 07/04/15 0525  WBC 10.1 11.6*  HGB 8.3* 8.3*  HCT 25.4* 25.6*  PLT 143* 167   ABG  Recent Labs Lab 07/03/15 0400 07/04/15 0418  PHART 7.264* 7.269*  PCO2ART 26.7* 26.6*  PO2ART 108* 116*   Liver Enzymes  Recent Labs Lab 07/03/15 0520  ALBUMIN 1.8*    Glucose  Recent Labs Lab 07/08/15 1159 07/08/15 1738 07/08/15 2113 07/08/15 2336 07/09/15 0445 07/09/15 0730  GLUCAP 241* 277*  305* 276* 199* 222*   Imaging Mr Brain Wo Contrast  07/08/2015  CLINICAL DATA:  Acute encephalopathy secondary to status epilepticus. EXAM: MRI HEAD WITHOUT CONTRAST TECHNIQUE: Multiplanar, multiecho pulse sequences of the brain and surrounding structures were obtained without intravenous contrast. COMPARISON:  06/27/2015 FINDINGS: Some sequences are mildly to moderately motion degraded. There is an approximately 1 cm acute infarct at the genu of the left internal capsule. There are 2 punctate foci of diffusion abnormality involving left occipital cortex with additional punctate foci in both temporoparietal regions, left external capsule region, and subcortical left frontal white matter  most compatible with additional tiny acute embolic infarcts. Mild diffusion signal abnormality remains in the right centrum semiovale corresponding to the suspected acute infarct on the prior study. Increased diffusion weighted signal is again seen involving medial frontal lobe cortex, left greater than right with some extension to the left parietal lobe. This has decreased in severity from the prior study and is again without associated T2/FLAIR signal abnormality. There is no evidence of intracranial hemorrhage, mass, midline shift, or extra-axial fluid collection. Dedicated imaging through the mesial temporal lobes demonstrates symmetric volume of the hippocampi without definite signal abnormality identified within limitations of motion artifact. There is mild global cerebral atrophy. Patchy to confluent T2 hyperintensities throughout the cerebral white matter bilaterally are unchanged and compatible with rather extensive chronic small vessel ischemic disease. A chronic lacunar infarct is again noted in the posterior right corona radiata. Prior bilateral cataract extraction is noted. There is essentially complete right frontal and right ethmoid sinus opacification. There is also opacification of multiple anterior left ethmoid air cells, and small volume fluid is present in the maxillary sinuses. Fluid is also noted in the nasopharynx. There are moderately large bilateral mastoid effusions. Endotracheal and enteric tubes are partially visualized. Major intracranial vascular flow voids are preserved. IMPRESSION: 1. Punctate, acute embolic infarcts scattered throughout the left cerebral hemisphere and in the right temporoparietal region. 2. Persistent but improved diffusion signal abnormality involving medial frontal lobe cortex bilaterally, likely the sequelae of recent seizure activity. 3. Extensive chronic small vessel ischemic disease. Electronically Signed   By: Sebastian Ache M.D.   On: 07/08/2015 17:47      DISCUSSION: 81 year old female with planned wd care 2/13 - nursing reports family having second thoguths 07/08/15  ASSESSMENT / PLAN:  NEUROLOGIC A:   Acute encephalopathy 2nd to status epilepticus with hx of CVA >> no improvement in mental status since sedation d/c'ed 2/06. 2/12 grimaces to pain, yawns Hx of depression, anxiety.   - comatoase without change 07/09/15 P:   AEDs per neurology  PULMONARY A: Acute respiratory failure with compromised airway 2nd to seizures.   - does SBT but not a candiate for medical extubation due to neuro isues  P:   Pressure support wean as tolerated. Tolerates PS  Family planning for extubation to comfort 2/13 -  CARDIOVASCULAR A:  Drug induced hypotension 2nd to sedation >> resolved. Hx of HTN, HLD. P:  Monitor hemodynamics  RENAL A:   CKD stage 4 >> baseline creatinine 2.10 from 05/28/15. Non gap metabolic acidosis.   - worsening creat 5  On 07/08/2015 and ur oputput 07/09/15  P:   Defer additional lab testing contine bicarb gtt  GASTROINTESTINAL A:   Dysphagia. Hx of GERD. P:   Tube feeds Protonix for SUP  HEMATOLOGIC A:   Anemia of critical illness. P:  Defer further lab testing SCD for DVT prevention  INFECTIOUS A:  No active issues. P:   Monitor clinically  ENDOCRINE CBG (last 3)   Recent Labs  07/08/15 2336 07/09/15 0445 07/09/15 0730  GLUCAP 276* 199* 222*     A:   Hx of DM, hypothyroidism. P:   SSI Continue synthroid  Goals of Care >> DNR established 2/09.  Family planning for vent withdrawal 2/13 per earlier repiort but having 2nd thoughts per RN 07/08/15. Marland Kitchen  No escalation of care beyond bicarb gtt and tube feeds but have asked neuro to re-eval 07/08/15 . D/w [place] DPOA 07/09/15 over phone - explained that terminal wean is because is a Rx that is not helping and that timing of death is independent of terminal wean and dependent on god/Jesus. She understood difference and agreed for terminal  wean most lijkely 06/30/2015    The patient is critically ill with multiple organ systems failure and requires high complexity decision making for assessment and support, frequent evaluation and titration of therapies, application of advanced monitoring technologies and extensive interpretation of multiple databases.   Critical Care Time devoted to patient care services described in this note is  30  Minutes. This time reflects time of care of this signee Dr Kalman Shan. This critical care time does not reflect procedure time, or teaching time or supervisory time of PA/NP/Med student/Med Resident etc but could involve care discussion time    Dr. Kalman Shan, M.D., [facility].C.P Pulmonary and Critical Care Medicine Staff Physician [facility] [place] [facility] Pager: [phone], If no answer or between  15:00h - 7:00h: call [phone]  07/09/2015 9:08 AM

## 2015-07-09 NOTE — Progress Notes (Signed)
Inpatient Diabetes Program Recommendations  AACE/ADA: New Consensus Statement on Inpatient Glycemic Control (2015)  Target Ranges:  Prepandial:   less than 140 mg/dL      Peak postprandial:   less than 180 mg/dL (1-2 hours)      Critically ill patients:  140 - 180 mg/dL   Results for Gina Andersen, Gina Andersen (MRN [id number]) as of 07/09/2015 07:48  Ref. Range 07/08/2015 00:18 07/08/2015 03:45 07/08/2015 08:22 07/08/2015 11:59 07/08/2015 17:38 07/08/2015 21:13  Glucose-Capillary Latest Ref Range: 65-99 mg/dL 478 (H) 295 (H) 621 (H) 241 (H) 277 (H) 305 (H)    Results for Gina Andersen, Gina Andersen (MRN [id number]) as of 07/09/2015 07:48  Ref. Range 07/08/2015 23:36 07/09/2015 04:45  Glucose-Capillary Latest Ref Range: 65-99 mg/dL 962 (H) 952 (H)    Home DM Meds: Levemir 55 units AM/ 8 units PM       Humalog 14 units with breakfast and 14 units with lunch  Current Insulin Orders: Novolog Moderate SSI (0-15 units) Q4 hours      MD- If within goals of care for this patient, please consider the following to help better control glucose levels in hospital-  1. Start Levemir 15 units bid (50% of home dose)  2. Start Novolog Tube Feed Coverage- Novolog 3 units Q4 hours (hold if tube feeds held for any reason)      --Will follow patient during hospitalization--  Ambrose Finland RN, MSN, CDE Diabetes Coordinator Inpatient Glycemic Control Team Team Pager: [phone] (8a-5p)

## 2015-07-10 DIAGNOSIS — G9340 Encephalopathy, unspecified: Secondary | ICD-10-CM

## 2015-07-10 DIAGNOSIS — N179 Acute kidney failure, unspecified: Secondary | ICD-10-CM

## 2015-07-10 DIAGNOSIS — Z515 Encounter for palliative care: Secondary | ICD-10-CM

## 2015-07-10 LAB — PHENYTOIN LEVEL, FREE AND TOTAL
PHENYTOIN FREE: 0.7 ug/mL — AB (ref 1.0–2.0)
Phenytoin, Total: 1.1 ug/mL — ABNORMAL LOW (ref 10.0–20.0)

## 2015-07-10 LAB — GLUCOSE, CAPILLARY
GLUCOSE-CAPILLARY: 248 mg/dL — AB (ref 65–99)
GLUCOSE-CAPILLARY: 242 mg/dL — AB (ref 65–99)

## 2015-07-10 MED ORDER — FENTANYL BOLUS VIA INFUSION
50.0000 ug | INTRAVENOUS | Status: DC | PRN
  Filled 2015-07-10: qty 200

## 2015-07-10 MED ORDER — SODIUM CHLORIDE 0.9 % IV SOLN
25.0000 ug/h | INTRAVENOUS | Status: DC
  Administered 2015-07-10: 50 ug/h via INTRAVENOUS
  Filled 2015-07-10: qty 50

## 2015-07-11 ENCOUNTER — Encounter: Payer: Self-pay | Admitting: Internal Medicine

## 2015-07-17 ENCOUNTER — Other Ambulatory Visit: Payer: Medicare Other

## 2015-07-17 ENCOUNTER — Telehealth: Payer: Self-pay

## 2015-07-17 ENCOUNTER — Ambulatory Visit: Payer: Medicare Other

## 2015-07-17 NOTE — Telephone Encounter (Signed)
On 07/17/2015 I received a death certificate from [facility] (original). The death certificate is for burial. The patient is a patient of Doctor Ramaswamy. The death certificate will be taken to [facility]) for signature. On 2015/08/06 I received the death certificate back form Doctor Marchelle Gearing. I got the death certificate ready and mailed the death certificate to the [facility] dept per their request.

## 2015-07-24 NOTE — Progress Notes (Signed)
200 ml Fentanyl wasted [employer] witnessed.

## 2015-07-24 NOTE — Progress Notes (Signed)
Pt placed back on full vent support due to low MVe

## 2015-07-24 NOTE — Progress Notes (Signed)
Patient death confirmed by Elease Hashimoto, RN and Shanon Brow, RN at [phone] Dr. Valetta Fuller notified.  Family was in the room at time of death.

## 2015-07-24 NOTE — Progress Notes (Signed)
   Jul 17, 2015 1254  Clinical Encounter Type  Visited With Patient and family together;Health care provider  Visit Type Initial;Critical Care;Patient actively dying  Referral From Nurse;Family  Spiritual Encounters  Spiritual Needs Prayer;Emotional  Stress Factors  Family Stress Factors Health changes;Loss   Chaplain responded to an end of life consult, and facilitated life review with the family and offered prayer and support. Chaplain support available as needed.   Alda Ponder, Chaplain 2015-07-17 12:55 PM

## 2015-07-24 NOTE — Progress Notes (Signed)
Gina Andersen is a 81 y.o. female    IV in place, occlusive dsg intact without redness.  Family Orientation to room, and floor.  SR up x 3, fall assessment complete, with patient and family able to verbalize understanding of risk associated with falls.   Comfort Care cart ordered for family.    Will cont to eval and treat per MD orders.  Alonza Bogus, RN 07/22/2015 5:00 PM

## 2015-07-24 NOTE — Procedures (Signed)
Extubation Procedure Note  Patient Details:   Name: Gina Andersen DOB: July 20, 1934 MRN: [id number]   Airway Documentation:     Evaluation  O2 sats: currently acceptable Complications: No apparent complications Patient did tolerate procedure well. Bilateral Breath Sounds: Rhonchi, Diminished Suctioning: Airway No   Terminal extubation.  [name] 07/22/2015, 11:52 AM

## 2015-07-24 NOTE — Progress Notes (Signed)
PULMONARY / CRITICAL CARE MEDICINE   Name: Gina Andersen MRN: [id number] DOB: [birth year]    ADMISSION DATE:  07/13/2015 CONSULTATION DATE:  07/21/2015  REFERRING MD:  TRH-MD  CHIEF COMPLAINT:  Status epilepticus  LINES/TUBES: ETT 2/4 >> L IJ TLC 2/4 >>STUDIES:  2/01 CT head >> chronic lacunar infarct 2/02 MRI brain >> abnormal signal frontal/parietal lobes on Lt 2/02 EEG >> burst suppression activity and bifrontal sharp activity with phase reversal at [facility] 2/05 EEG >> lateralize periodic discharges in the left frontal region  2/05 LP >> oligoclonal bands negative, IgG 7.5, protein 36, glucose 106, RBC 670, WBC 2  CULTURES: None  ANTIBIOTICS: None  SIGNIFICANT EVENTS: 2/4 VDRF for seizure and burst suppression 2/6 Sedation d/c'ed 2/9 Family meeting >> DNR 2/12 - Has been off sedation since 07/01/15.  WD planned 2/13. Grimaces to pain  07/08/15 - famiy not at bedside but RN says family having 2nd thoughts about terminal wean and want to get a Ct head. Patient unresponsive but has pupil, gag, able to do PSV and cough. On bic gtt and creat up at 5    07/09/15 - coma continues. No family at bedside. D/w DPOA daugther Gina Andersen of [place], [place] - > hard to understand, rambles a lot but gist is that they have no $ and they are going to [place], [place] to make funeral arrangement and wanted patient to be left in hands of Jesus. She had hard time committing to terminal wean. She did acknowledge aptient is critical, has renal failure and esizures and will not want long term care    SUBJECTIVE/OVERNIGHT/INTERVAL HX 07-26-15 - poor ur op. Setting off low volumes on PSV - worse than 2d ago. Coma continues. Normal bp  VITAL SIGNS: BP 120/52 mmHg  Pulse 78  Temp(Src) 98.2 F (36.8 C) (Core (Comment))  Resp 20  Ht  (1.549 m)  Wt 146 lb 6.2 oz (66.4 kg)  BMI 27.67 kg/m2  SpO2 100%  HEMODYNAMICS:    VENTILATOR SETTINGS: Vent Mode:  [-] PRVC FiO2 (%):  [30 %] 30 % Set Rate:   [16 bmp] 16 bmp Vt Set:  [380 mL] 380 mL PEEP:  [5 cmH20] 5 cmH20 Pressure Support:  [5 cmH20] 5 cmH20 Plateau Pressure:  [14 cmH20-21 cmH20] 14 cmH20  INTAKE / OUTPUT: I/O last 3 completed shifts: In: 4515 [I.V.:2850; NG/GT:1520; IV Piggyback:145] Out: 656 [Urine:656]  PHYSICAL EXAMINATION: General: off sedation Neuro: comatose, - off sedation HEENT: ETT in place Cardiac: regular Chest: no wheeze Abd: soft, non tender, tf at goal Ext: no edema Skin: no rashes  LABS:  BMET  Recent Labs Lab 07/04/15 0525 07/05/15 0620 07/08/15 0345  NA 133* 132* 132*  K 5.1 5.1 5.2*  CL 107 106 104  CO2 13* 14* 16*  BUN 79* 101* 142*  CREATININE 3.46* 3.95* 5.45*  GLUCOSE 284* 230* 293*   Electrolytes  Recent Labs Lab 07/04/15 0525 07/05/15 0620 07/08/15 0345  CALCIUM 8.0* 8.1* 8.8*  MG 1.7  --   --   PHOS 5.2*  --   --    CBC  Recent Labs Lab 07/04/15 0525  WBC 11.6*  HGB 8.3*  HCT 25.6*  PLT 167   ABG  Recent Labs Lab 07/04/15 0418  PHART 7.269*  PCO2ART 26.6*  PO2ART 116*   Liver Enzymes No results for input(s): AST, ALT, ALKPHOS, BILITOT, ALBUMIN in the last 168 hours.  Glucose  Recent Labs Lab 07/09/15 1139 07/09/15 1536 07/09/15 1933  07/09/15 2302 August 02, 2015 0323 August 02, 2015 0806  GLUCAP 240* 249* 281* 299* 248* 242*   Imaging No results found.   DISCUSSION: 81 year old female with planned wd care 2/13 - nursing reports family having second thoguths 07/08/15  ASSESSMENT / PLAN:  NEUROLOGIC A:   Acute encephalopathy 2nd to status epilepticus with hx of CVA >> no improvement in mental status since sedation d/c'ed 2/06. 2/12 grimaces to pain, yawns Hx of depression, anxiety.   - comatoase without change as of  2015-08-02 P:   AEDs per neurology  PULMONARY A: Acute respiratory failure with compromised airway 2nd to seizures.   - for terminal wean      CARDIOVASCULAR A:  Drug induced hypotension 2nd to sedation >> resolved. Hx of  HTN, HLD.   RENAL A:   CKD stage 4 >> baseline creatinine 2.10 from 05/28/15. Non gap metabolic acidosis.   - worsening creat 5  On 07/08/2015 and ur oputput 08-02-15  P Dc bic gtt  GASTROINTESTINAL A:   Dysphagia. Hx of GERD. P:   Dc tube feeds  HEMATOLOGIC A:   Anemia of critical illness. P:  Defer further lab testing SCD for DVT prevention  INFECTIOUS A:   No active issues. P:   Monitor clinically  ENDOCRINE CBG (last 3)   Recent Labs  07/09/15 2302 2015/08/02 0323 08-02-15 0806  GLUCAP 299* 248* 242*     A:   Hx of DM, hypothyroidism. P:   SSI dc dcsynthroid  Goals of Care >> DNR established 2/09.  Family planning for vent withdrawal 2/13 per earlier repiort but having 2nd thoughts per RN 07/08/15. Marland Kitchen  No escalation of care beyond bicarb gtt and tube feeds but have asked neuro to re-eval 07/08/15 . D/w [place] DPOA 07/09/15 over phone - explained that terminal wean is because is a Rx that is not helping and that timing of death is independent of terminal wean and dependent on god/Jesus. She understood difference and agreed for terminal wean most lijkely 08/02/2015   IDT goals of care 02-Aug-2015 again with RN Orpha Bur present - son, daughter DPOA, another daughter, daughter in law, and a friend/neice. Daughter says patient already in heaven and want to be with Jesus. Son does not want mom to suffer like this. They want terminal wean. Want to ensure comfort. Will start fent gtt due to renakl failure Get chaplain    The patient is critically ill with multiple organ systems failure and requires high complexity decision making for assessment and support, frequent evaluation and titration of therapies, application of advanced monitoring technologies and extensive interpretation of multiple databases.   Critical Care Time devoted to patient care services described in this note is  30  Minutes. This time reflects time of care of this signee Dr Kalman Shan. This critical  care time does not reflect procedure time, or teaching time or supervisory time of PA/NP/Med student/Med Resident etc but could involve care discussion time    Dr. Kalman Shan, M.D., [facility].C.P Pulmonary and Critical Care Medicine Staff Physician [facility] [place] Pulmonary and Critical Care Pager: [phone], If no answer or between  15:00h - 7:00h: call [phone]  08/02/2015 9:58 AM

## 2015-07-24 NOTE — Progress Notes (Signed)
Nutrition Brief Note  Chart reviewed. Pt now transitioning to comfort care.  No further nutrition interventions warranted at this time.  Please re-consult as needed.   Mica Ramdass RD, LDN, CNSC [phone] Pager [phone] After Hours Pager    

## 2015-07-24 NOTE — Clinical Social Work Note (Signed)
Clinical Social Worker received referral for comfort care support for family who is withdrawing support from patient.  CSW has spoken with RN and Candelaria Stagers regarding patient family.  Patient family has already communicated with [facility] who will continue to follow patient family through withdraw process as needed.  RN to notify CSW if further needs arise following patient death.  CSW remains available for outside support to family and staff as needed.  Macario Golds, [place] [phone]

## 2015-07-24 DEATH — deceased

## 2015-08-08 ENCOUNTER — Ambulatory Visit: Payer: Medicare Other

## 2015-08-08 ENCOUNTER — Ambulatory Visit: Payer: Medicare Other | Admitting: Hematology and Oncology

## 2015-08-08 ENCOUNTER — Other Ambulatory Visit: Payer: Medicare Other

## 2015-08-24 NOTE — Discharge Summary (Signed)
DISCHARGE SUMMARY    Date of admit: 2015-06-30  3:38 PM Date of discharge: 07/04/2015  6:56 PM Length of Stay: 11 days  PCP is Nelda Bucks., MD   PROBLEM LIST Principal Problem:   Seizures (HCC) Active Problems:   Hypothyroidism   DM (diabetes mellitus), type 2, uncontrolled (HCC)   Chronic anemia   HTN (hypertension)   CKD (chronic kidney disease), stage V (HCC)   CVA (cerebral vascular accident) (HCC)   Seizure (HCC)   Acute respiratory failure with hypoxia (HCC)   Arterial hypotension   History of ETT   Terminal care   Encephalopathy acute   Acute renal failure (HCC)    SUMMARY Gina Andersen was 81 y.o. patient with    has a past medical history of Hypothyroidism (11/01/1995); Anxiety (11/01/1995); Diabetes mellitus type II (05/25/1996); Hyperlipemia (06/25/2002); Atopic dermatitis; History of MRI of brain and brain stem (07/10/2005); Dehydration; Pancytopenia (06/24-7/05/2007); History of gallstones (11/21/2007); Acute cholecystitis (12/1/-05/20/08); Gallstones (05/11/08); Gastro-esophageal reflux disease without esophagitis; Depression; Anxiety disorder; Dysphagia; Hearing impaired; Hypertension; Carpal tunnel syndrome; Weakness; Anemia; Glaucoma; Mood disorder (HCC); and Kidney disease.   has past surgical history that includes Partial hysterectomy; carpel tunn; Trigger finger release; Ganglionectomy; Cholecystectomy; and Abdominal hysterectomy.   Admitted on 2015/06/30 with   Karoline Caldwell wass a 81 y.o. female he was admitted to [facility] 4 days ago with acute encephalopathy in the setting of seizures in the setting of hypoglycemia. She was being treated in her nursing facility for UTI known to have ESBL. At [facility] revealed CVA and continuous seizure. [facility] for [facility]  COURSE  STUDIES and EVENTS 2/01 CT head >> chronic lacunar infarct 2/02 MRI brain >> abnormal signal frontal/parietal lobes on Lt 2/02 EEG >> burst  suppression activity and bifrontal sharp activity with phase reversal at F4  2/4 VDRF for seizure and burst suppression  2/05 EEG >> lateralize periodic discharges in the left frontal region  2/05 LP >> oligoclonal bands negative, IgG 7.5, protein 36, glucose 106, RBC 670, WBC 2 2/6 Sedation d/c'ed 2/9 Family meeting >> DNR 2/12 - Has been off sedation since 07/01/15. WD planned 2/13. Grimaces to pain  07/08/15 - famiy not at bedside but RN says family having 2nd thoughts about terminal wean and want to get a Ct head. Patient unresponsive but has pupil, gag, able to do PSV and cough. On bic gtt and creat up at 5    07/09/15 - coma continues. No family at bedside. D/w DPOA daugther [name] of [place], [place] - > hard to understand, rambles a lot but gist is that they have no $ and they are going to [place], [place] to make funeral arrangement and wanted patient to be left in hands of Jesus. She had hard time committing to terminal wean. She did acknowledge aptient is critical, has renal failure and esizures and will not want long term care   07/13/2015 - poor ur op. Setting off low volumes on PSV - worse than 2d ago. Coma continues. Normal bp . Terminal wean performed  Patient expired 07/14/2015      SIGNED Dr. Kalman Shan, M.D., F.C.C.P Pulmonary and Critical Care Medicine Staff Physician [facility] [place] Pulmonary and Critical Care Pager: [phone], If no answer or between  15:00h - 7:00h: call [phone]  07/24/2015 5:41 AM

## 2015-09-09 ENCOUNTER — Ambulatory Visit: Payer: Medicare Other | Admitting: Podiatry

## 2015-11-11 ENCOUNTER — Other Ambulatory Visit: Payer: Self-pay | Admitting: Nurse Practitioner

## 2015-11-27 ENCOUNTER — Ambulatory Visit: Payer: Medicare Other | Admitting: Obstetrics and Gynecology

## 2016-06-16 IMAGING — US US RENAL
1 series · 14 of 25 positions shown · non-contrast
Comparison: CT abdomen pelvis 01/27/2013

CLINICAL DATA: Patient with acute renal failure.

EXAM:
RENAL / URINARY TRACT ULTRASOUND COMPLETE

[Series 1: us renal · 14 of 47 slices shown]
[im 1/47]
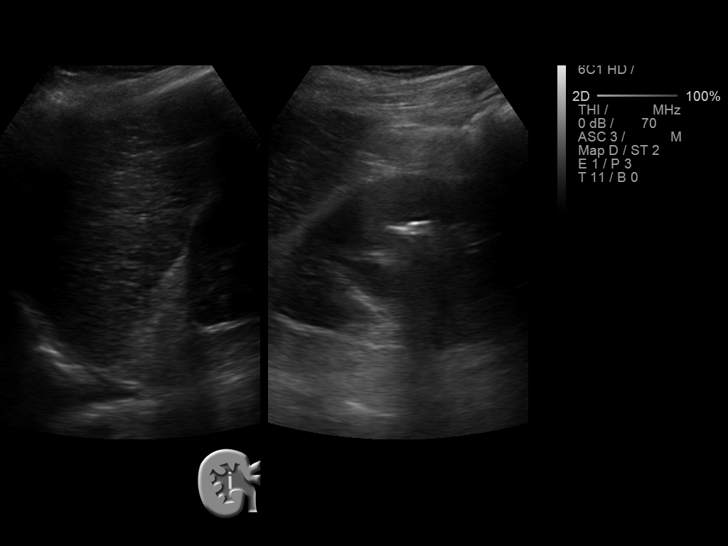
[im 4/47]
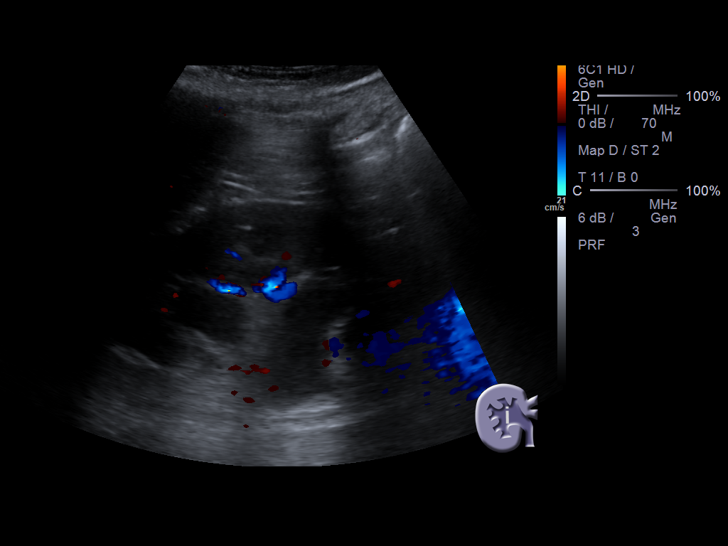
[im 8/47]
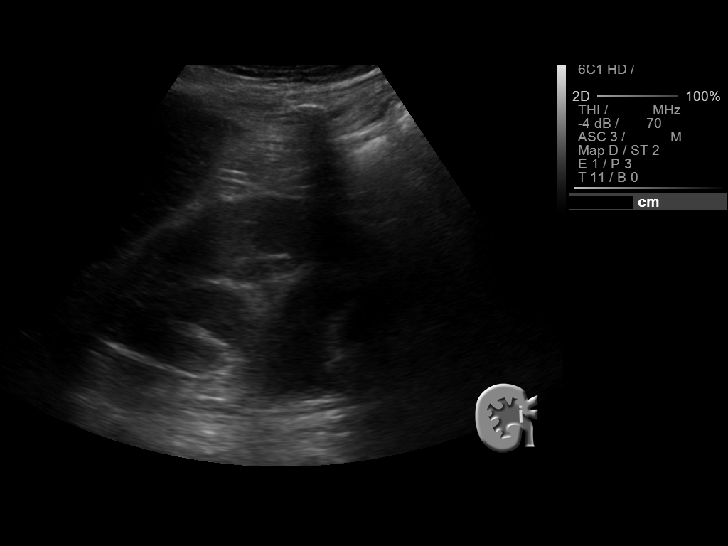
[im 12/47]
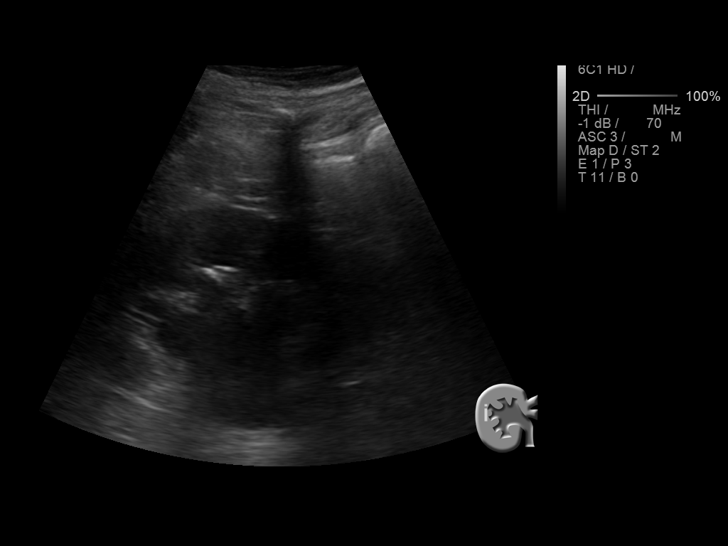
[im 16/47]
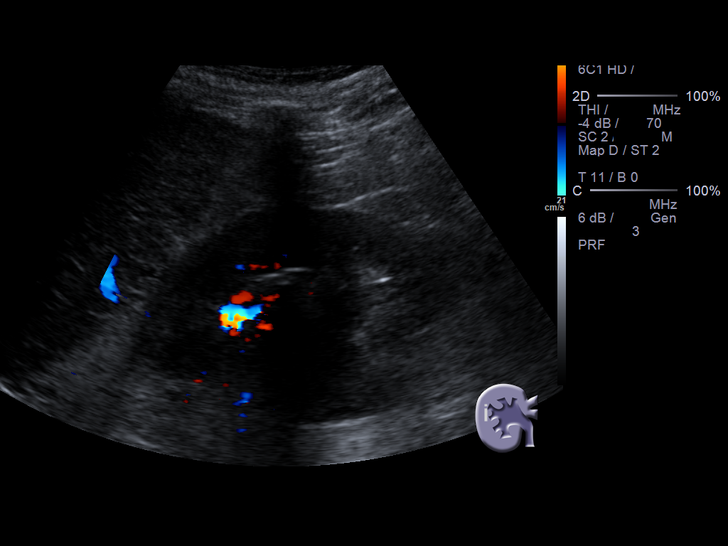
[im 18/47]
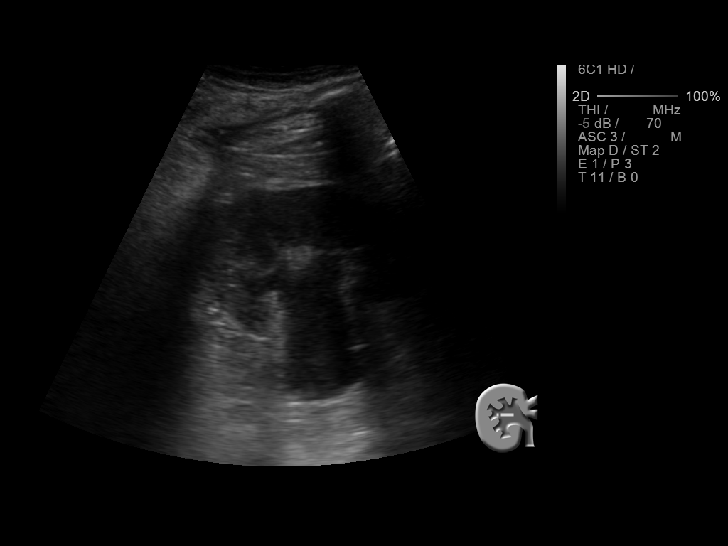
[im 22/47]
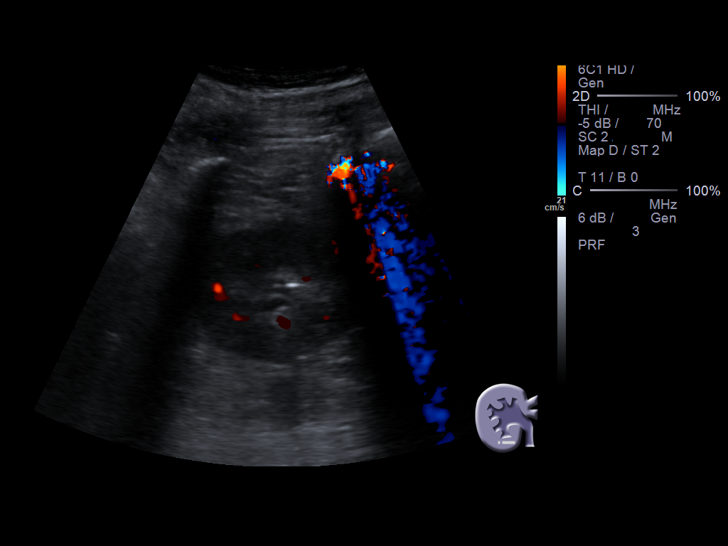
[im 25/47]
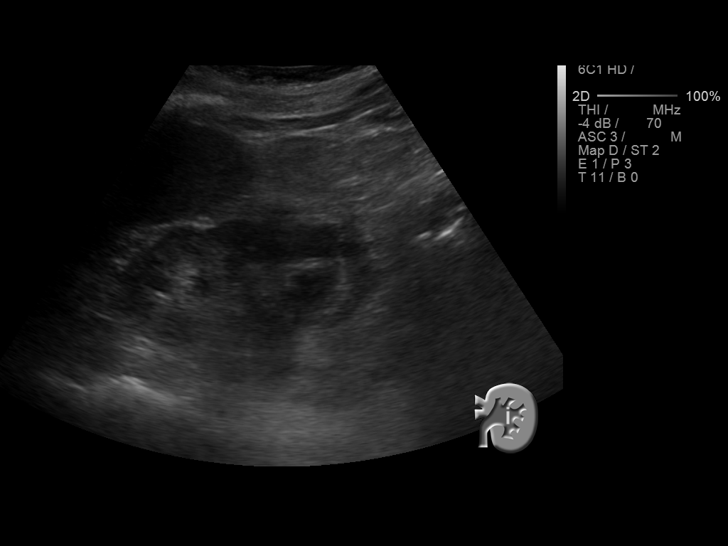
[im 29/47]
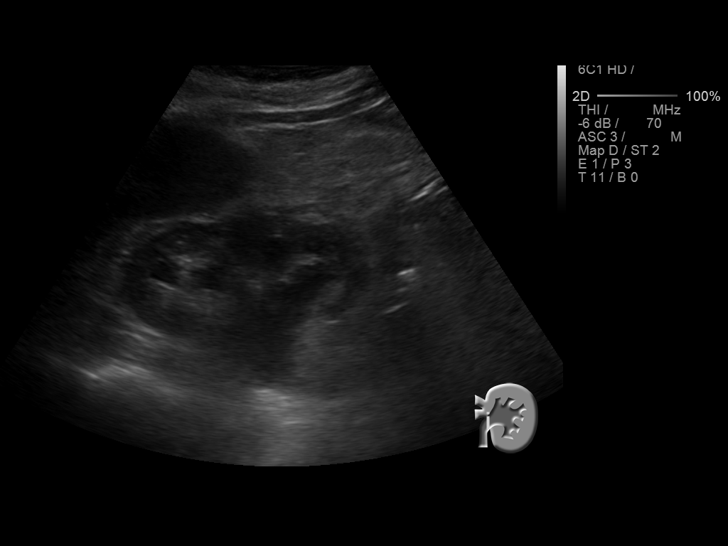
[im 31/47]
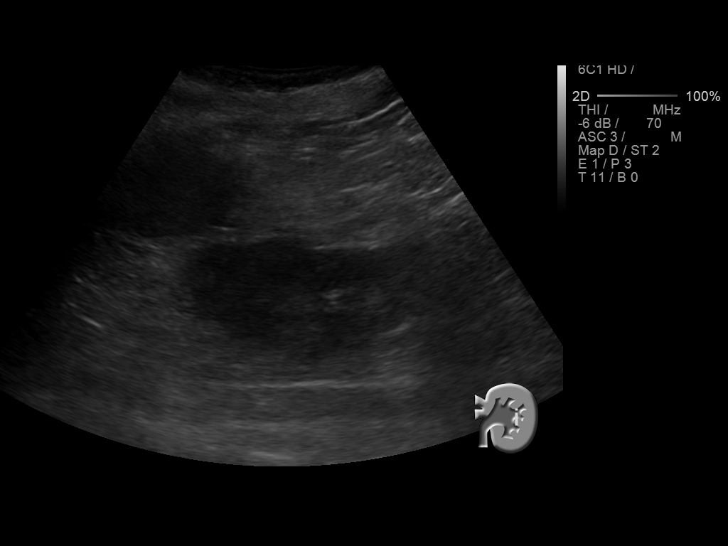
[im 35/47]
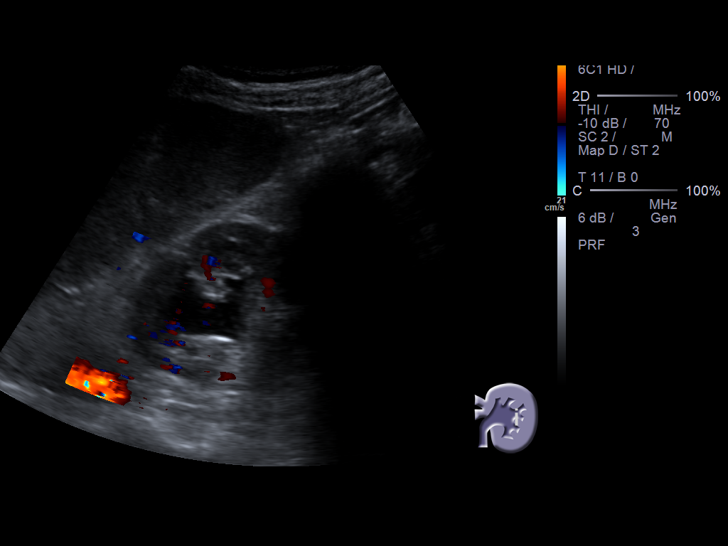
[im 39/47]
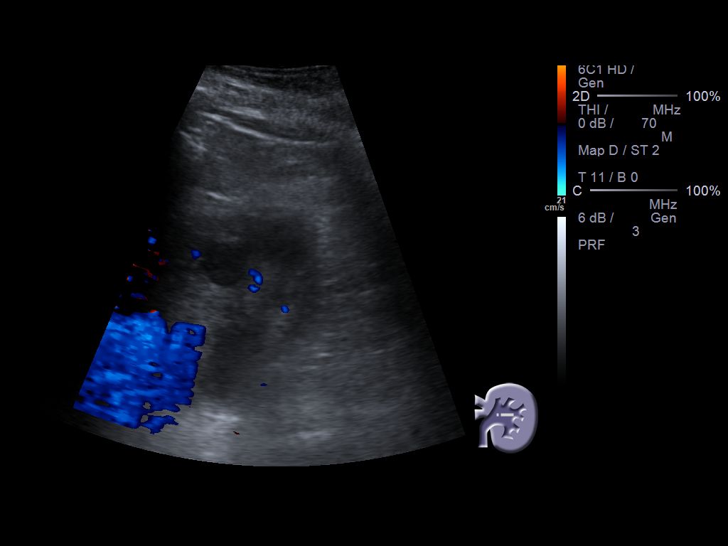
[im 43/47]
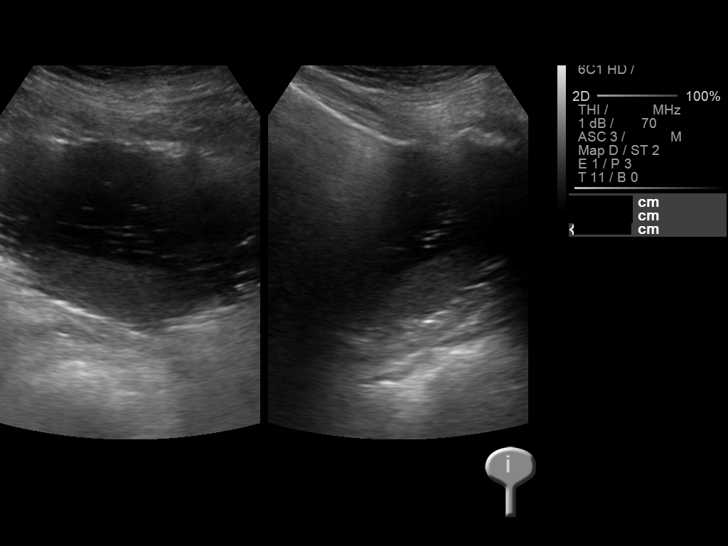
[im 47/47]
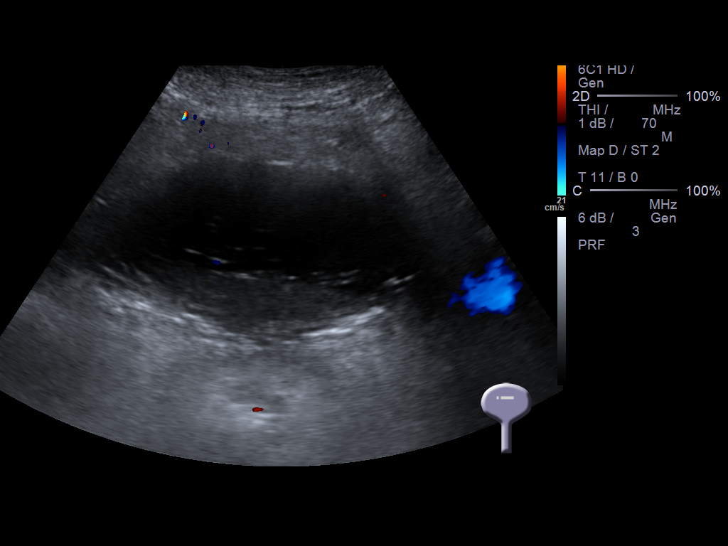

[14 of 25 positions shown; findings below may reference images not displayed]

FINDINGS: Right Kidney:

Length: 12.0 cm. Mild hydronephrosis. There are a few echogenic
shadowing foci within the right kidney which are nonspecific however
may represent small stones.

Left Kidney:

Length: 9.2 cm. Mild hydronephrosis. There are multiple echogenic
foci which may represent small stones.

Bladder:

There is a mixed echogenicity region within the dependent aspect of
the bladder which demonstrates some mobility measuring up to 7 cm,
potentially representing debris.
IMPRESSION: Bilateral hydronephrosis.

Echogenic foci within the kidneys bilaterally raising the
possibility of bilateral nephrolithiasis.

Large isoechoic area within the posterior aspect of the bladder
measuring up to 7 cm may represent debris however bladder mass is
not excluded. Recommend correlation with urinalysis and direct
visualization.

## 2016-06-18 IMAGING — US US RENAL
1 series · 14 of 25 positions shown · non-contrast
Comparison: 01/12/2015

CLINICAL DATA: Followup hydronephrosis status post Foley catheter
placement.

EXAM:
RENAL / URINARY TRACT ULTRASOUND COMPLETE

[Series 1: us renal · 0.20mm/px · 40 acquisitions, 14 frames shown]
[im 1/40]
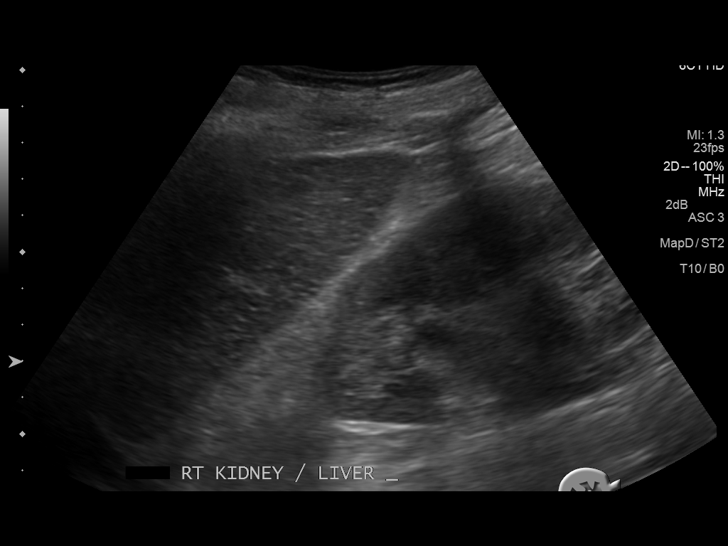
[im 4/40]
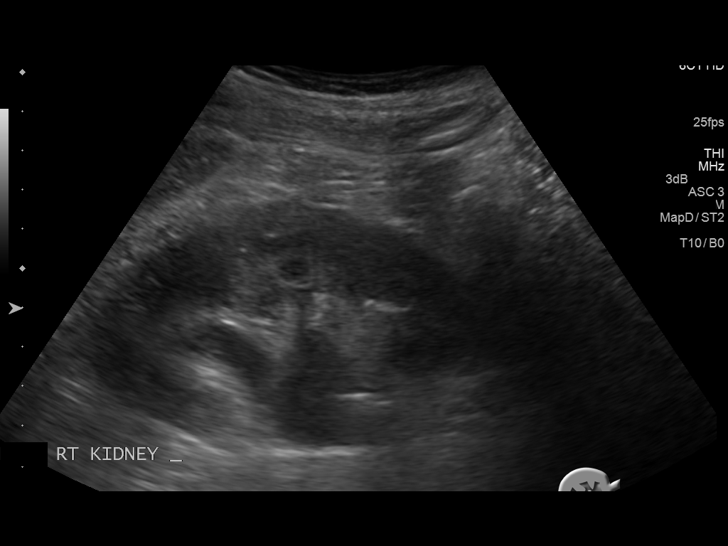
[im 7/40]
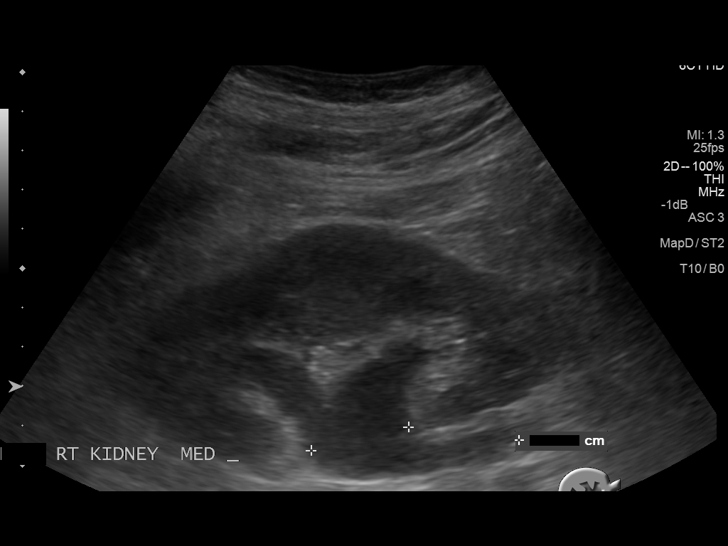
[im 10/40]
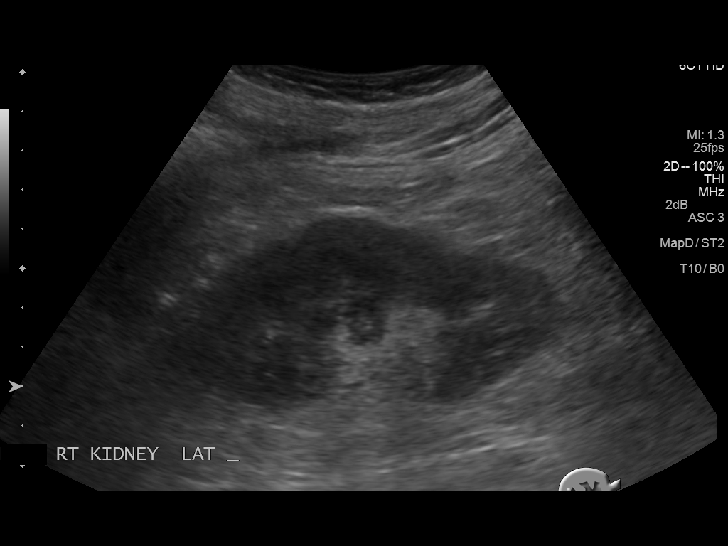
[im 14/40]
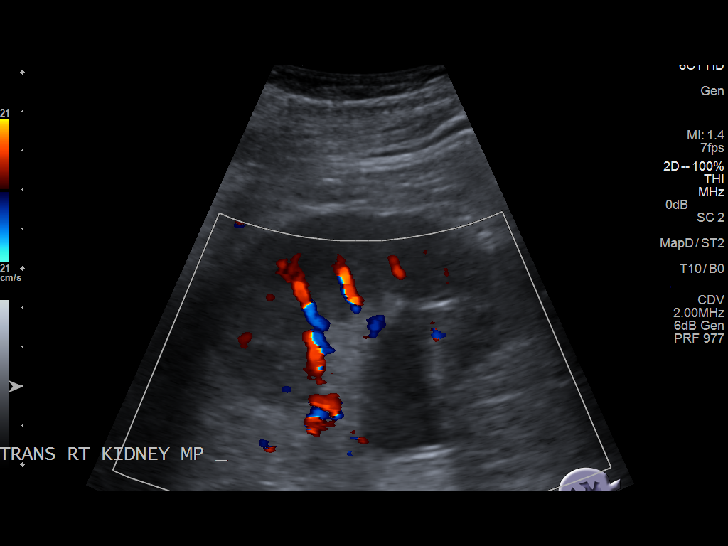
[im 15/40]
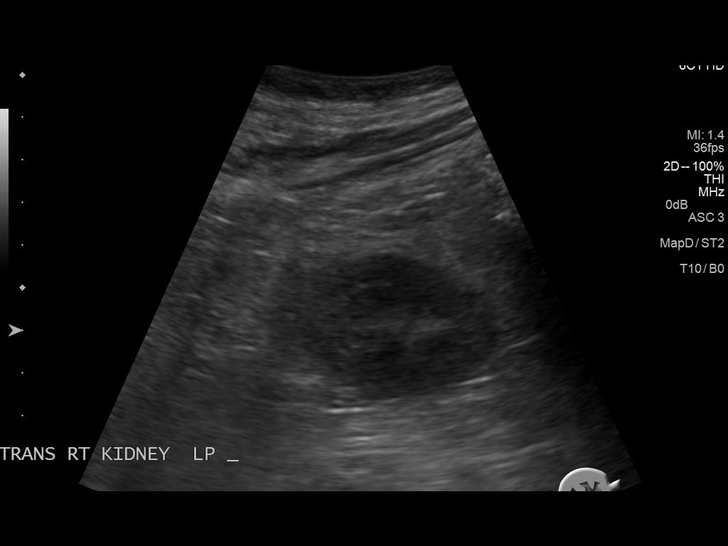
[im 18/40]
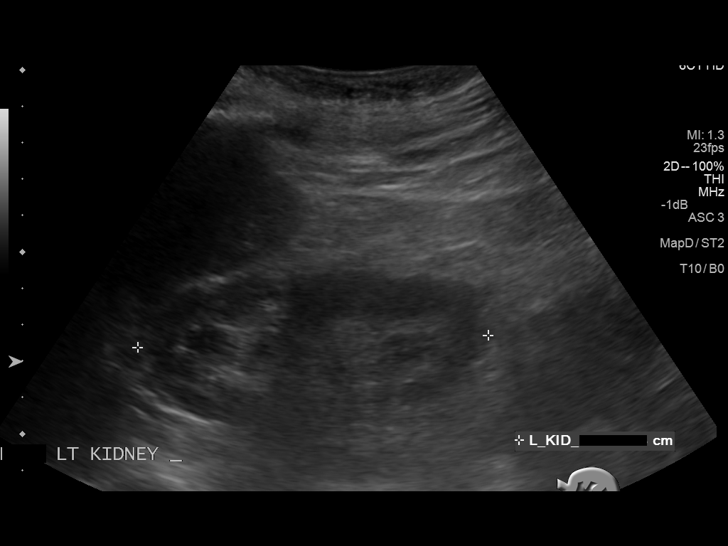
[im 22/40]
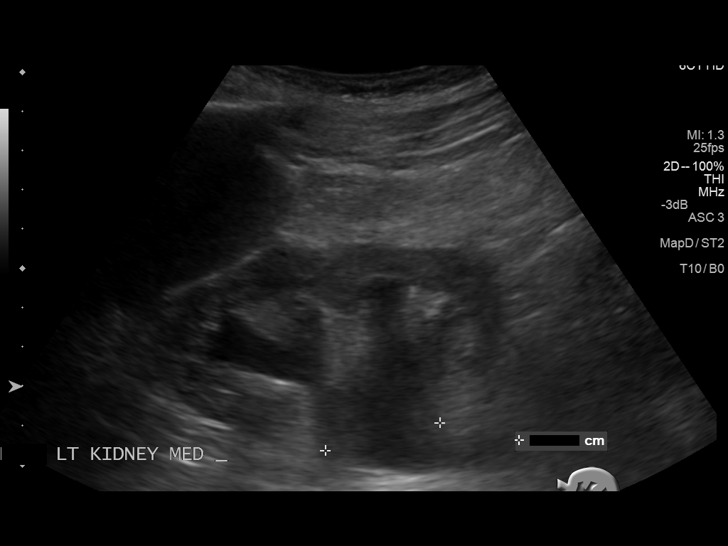
[im 25/40]
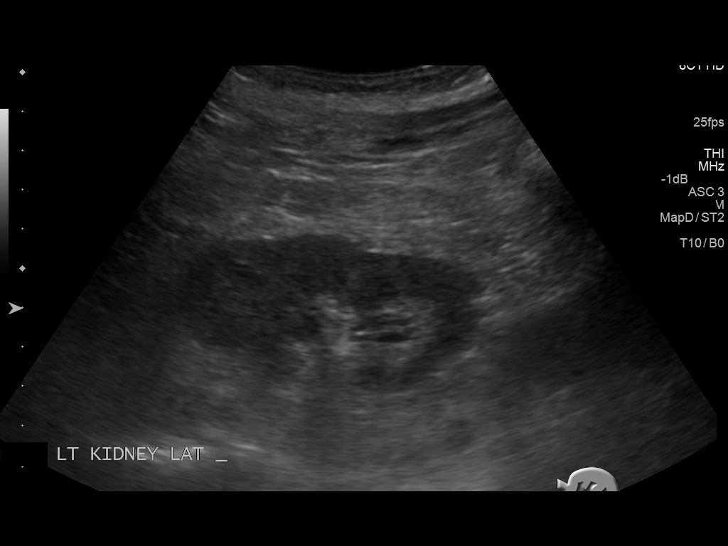
[im 27/40]
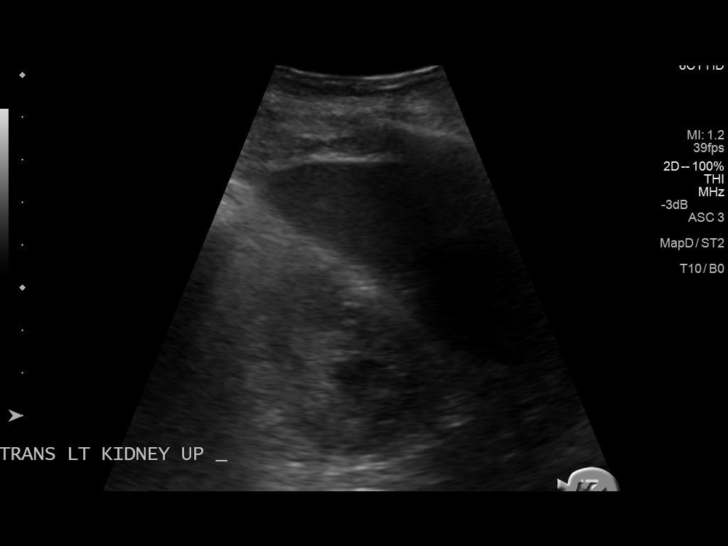
[im 30/40]
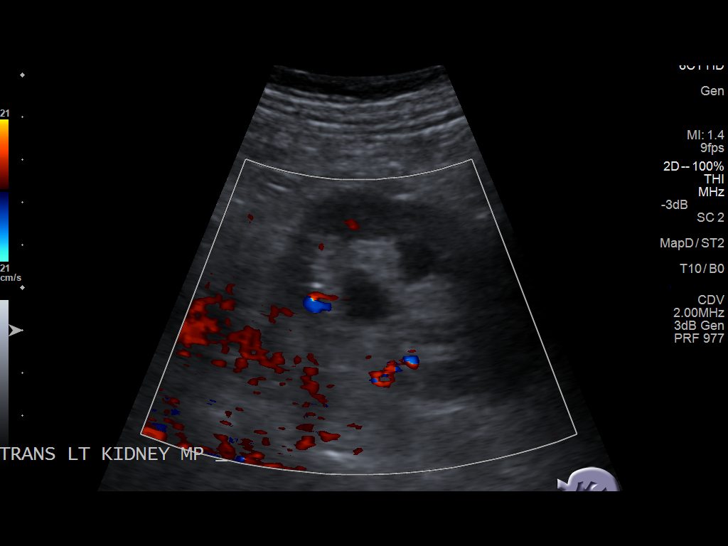
[im 33/40]
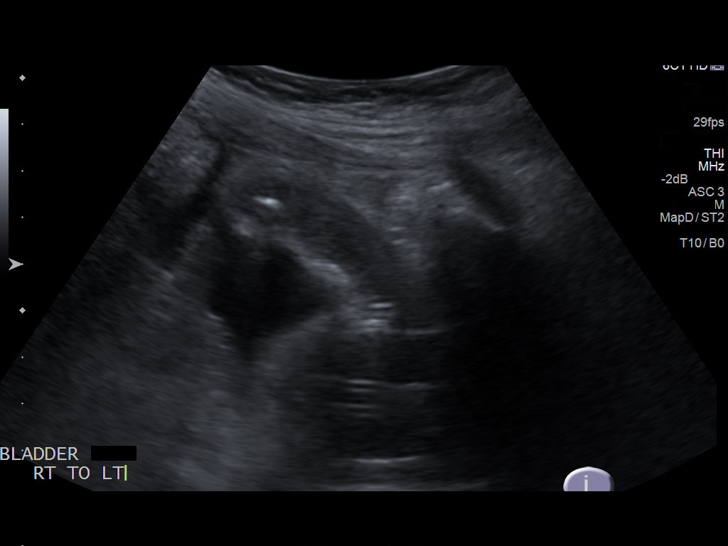
[im 36/40]
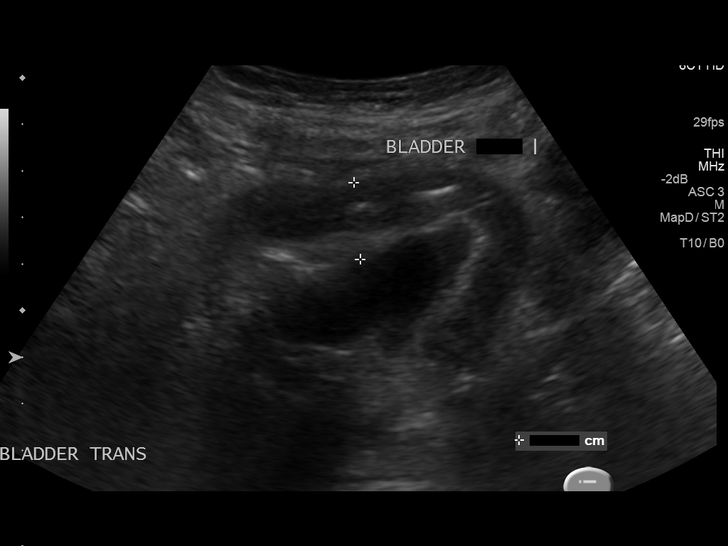
[im 40/40]
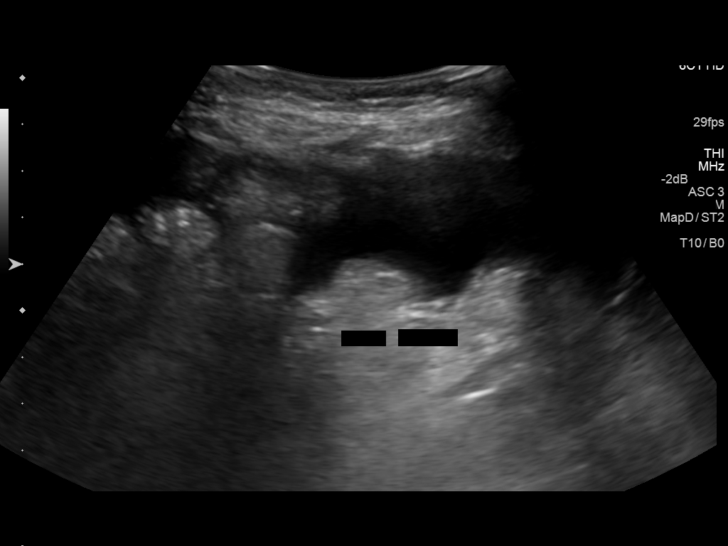

[14 of 25 positions shown; findings below may reference images not displayed]

FINDINGS: Right Kidney:

Length: 11.5 cm. Normal renal cortical thickness and echogenicity
without focal lesions. Persistent moderate hydronephrosis. No
definite renal calculi.

Left Kidney:

Length: 9.6 cm. Mild renal cortical thinning and probable scarring
change. Persistent hydronephrosis.

Bladder:

Decompressed by a Foley catheter.

Other:  Small amount of ascites.
IMPRESSION: Persistent bilateral hydronephrosis.

## 2017-05-21 IMAGING — CR DG CHEST 1V PORT
1 series · 1 of 1 positions shown · non-contrast
Comparison: Chest radiograph 08/10/2013

CLINICAL DATA: Patient with altered mental status.

EXAM:
PORTABLE CHEST - 1 VIEW

[portable]
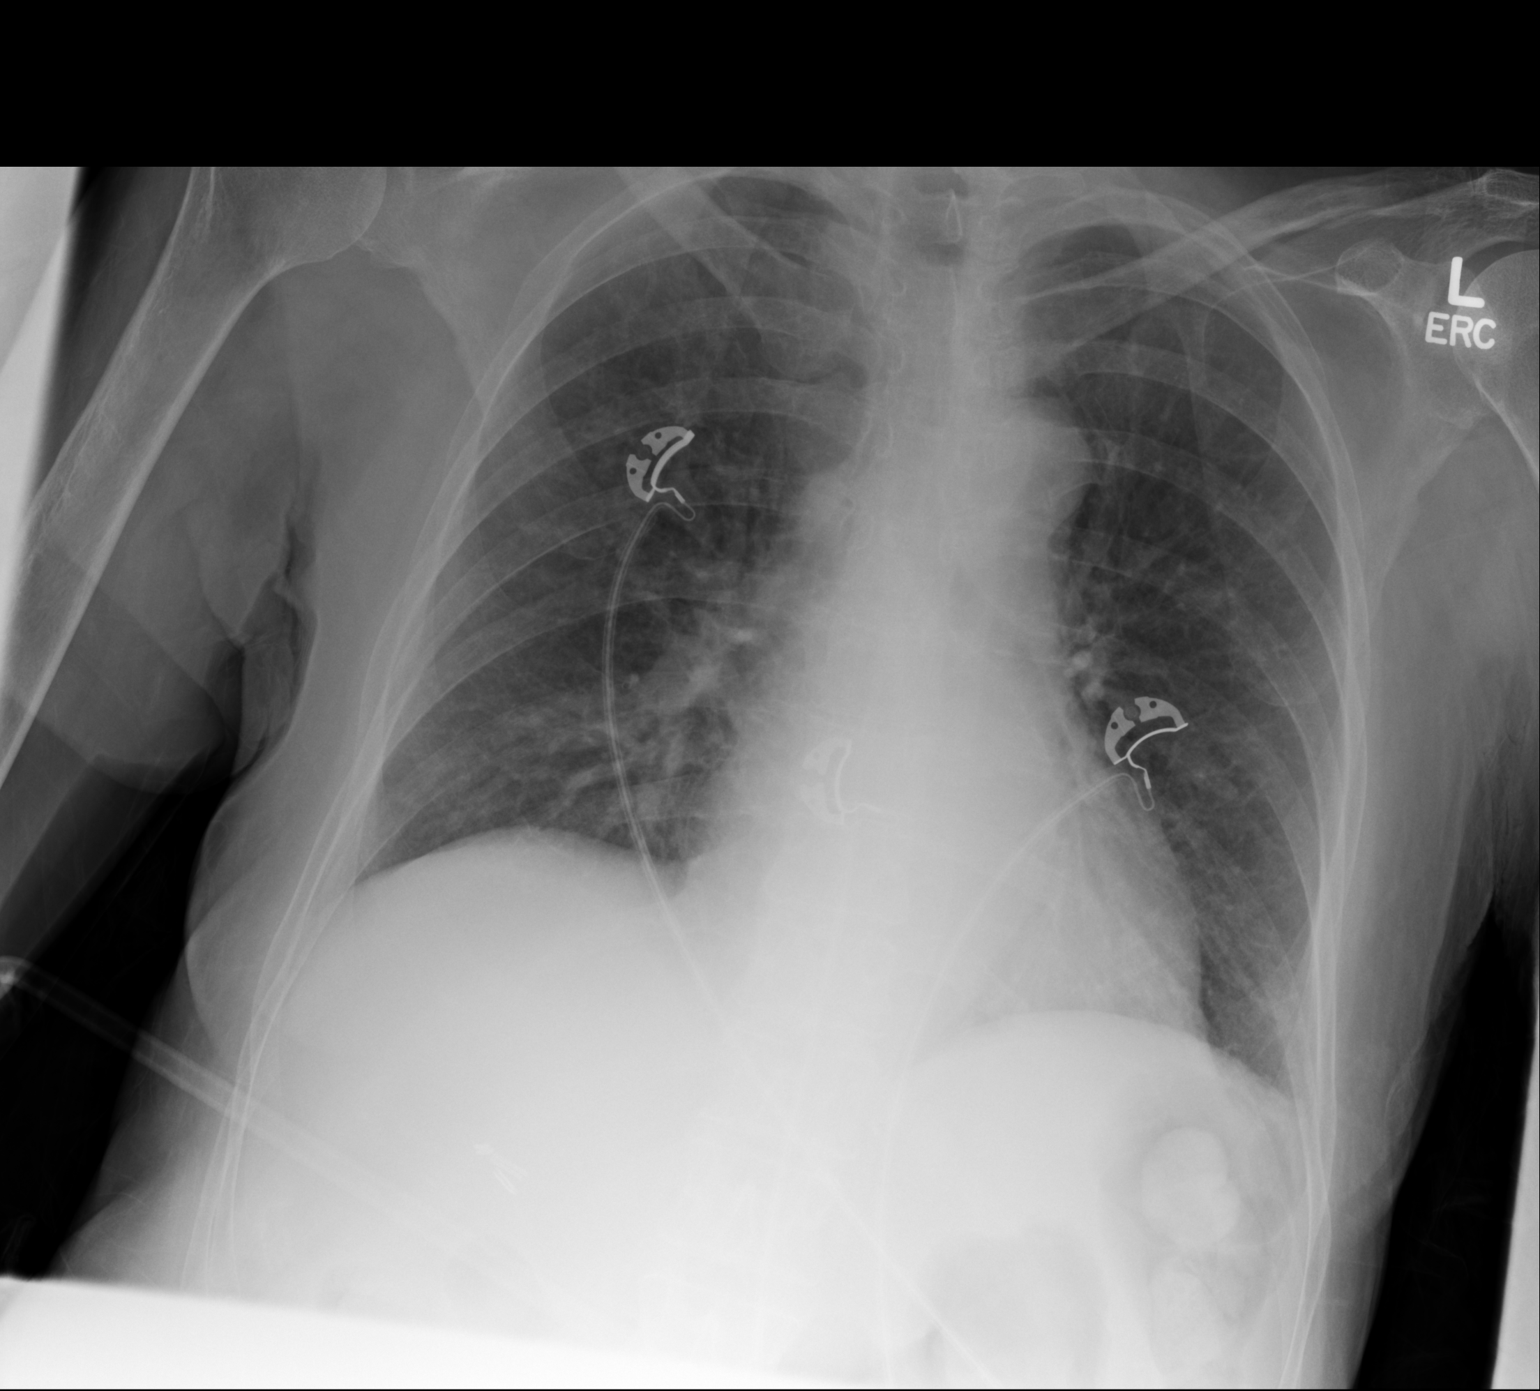

[1 of 1 positions shown; findings below may reference images not displayed]

FINDINGS: Monitoring leads overlie the patient. Stable enlarged cardiac and
mediastinal contours. Pulmonary vascular redistribution and
bilateral predominately perihilar interstitial pulmonary opacities.
IMPRESSION: Cardiomegaly. Mild interstitial pulmonary opacities suggestive of
mild edema.

## 2017-05-26 IMAGING — CR DG CHEST 1V PORT
1 series · 1 of 1 positions shown · non-contrast
Comparison: 01/11/2015

CLINICAL DATA: Altered mental status and abnormal renal function
with UTIs, cough

EXAM:
PORTABLE CHEST - 1 VIEW

[ap]
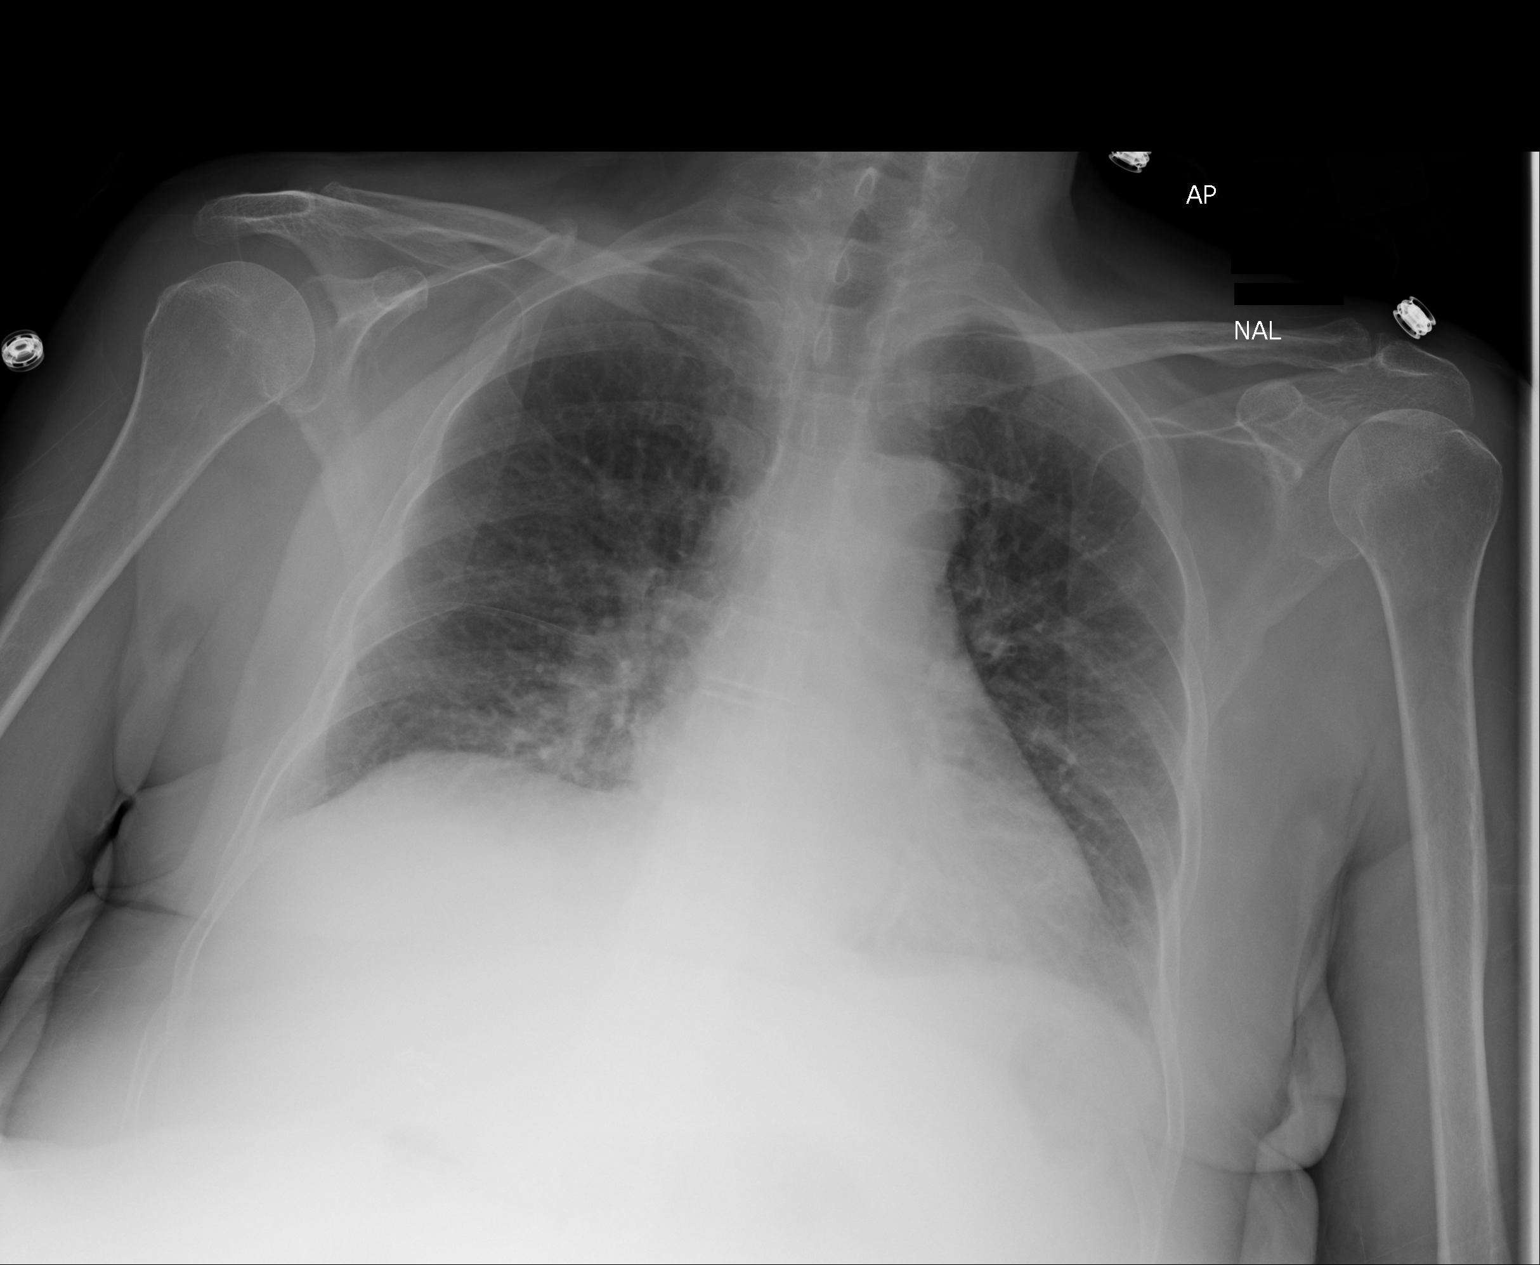

[1 of 1 positions shown; findings below may reference images not displayed]

FINDINGS: Cardiac shadow is stable. Lungs are well aerated bilaterally. No
acute bony abnormality is seen.
IMPRESSION: No acute abnormality noted.
